# Patient Record
Sex: Male | Born: 1965 | Race: White | Hispanic: No | Marital: Married | Smoking: Never smoker
Health system: Southern US, Community
[De-identification: ages and names within clinical notes are randomized; demographics above are authoritative.]

## PROBLEM LIST (undated history)

## (undated) DIAGNOSIS — F32A Depression, unspecified: Secondary | ICD-10-CM

## (undated) DIAGNOSIS — G4730 Sleep apnea, unspecified: Secondary | ICD-10-CM

## (undated) DIAGNOSIS — F419 Anxiety disorder, unspecified: Secondary | ICD-10-CM

## (undated) DIAGNOSIS — K219 Gastro-esophageal reflux disease without esophagitis: Secondary | ICD-10-CM

## (undated) DIAGNOSIS — F329 Major depressive disorder, single episode, unspecified: Secondary | ICD-10-CM

## (undated) DIAGNOSIS — I10 Essential (primary) hypertension: Secondary | ICD-10-CM

## (undated) HISTORY — PX: CHOLECYSTECTOMY: SHX55

---

## 2017-10-08 ENCOUNTER — Ambulatory Visit (HOSPITAL_COMMUNITY): Payer: BLUE CROSS/BLUE SHIELD

## 2017-10-08 ENCOUNTER — Ambulatory Visit (HOSPITAL_BASED_OUTPATIENT_CLINIC_OR_DEPARTMENT_OTHER): Payer: BLUE CROSS/BLUE SHIELD

## 2017-10-08 ENCOUNTER — Other Ambulatory Visit: Payer: Self-pay | Admitting: Emergency Medicine

## 2017-10-08 ENCOUNTER — Encounter: Payer: Self-pay | Admitting: Nurse Practitioner

## 2017-10-08 ENCOUNTER — Ambulatory Visit: Payer: BLUE CROSS/BLUE SHIELD | Admitting: Nurse Practitioner

## 2017-10-08 VITALS — BP 146/88 | HR 100 | Temp 98.2°F | Resp 18 | Wt 256.1 lb

## 2017-10-08 DIAGNOSIS — M545 Low back pain: Secondary | ICD-10-CM | POA: Diagnosis not present

## 2017-10-08 DIAGNOSIS — M549 Dorsalgia, unspecified: Secondary | ICD-10-CM

## 2017-10-08 DIAGNOSIS — R771 Abnormality of globulin: Secondary | ICD-10-CM

## 2017-10-08 DIAGNOSIS — F329 Major depressive disorder, single episode, unspecified: Secondary | ICD-10-CM | POA: Diagnosis not present

## 2017-10-08 LAB — CBC WITH DIFFERENTIAL/PLATELET
BASO%: 0.4 % (ref 0.0–2.0)
BASOS ABS: 0.1 10*3/uL (ref 0.0–0.1)
EOS%: 0.5 % (ref 0.0–7.0)
Eosinophils Absolute: 0.1 10*3/uL (ref 0.0–0.5)
HEMATOCRIT: 50.1 % — AB (ref 38.4–49.9)
HGB: 17.2 g/dL — ABNORMAL HIGH (ref 13.0–17.1)
LYMPH#: 3.1 10*3/uL (ref 0.9–3.3)
LYMPH%: 23.6 % (ref 14.0–49.0)
MCH: 30.4 pg (ref 27.2–33.4)
MCHC: 34.2 g/dL (ref 32.0–36.0)
MCV: 88.9 fL (ref 79.3–98.0)
MONO#: 1.1 10*3/uL — ABNORMAL HIGH (ref 0.1–0.9)
MONO%: 8.1 % (ref 0.0–14.0)
NEUT#: 8.9 10*3/uL — ABNORMAL HIGH (ref 1.5–6.5)
NEUT%: 67.4 % (ref 39.0–75.0)
Platelets: 326 10*3/uL (ref 140–400)
RBC: 5.64 10*6/uL (ref 4.20–5.82)
RDW: 14.7 % — ABNORMAL HIGH (ref 11.0–14.6)
WBC: 13.1 10*3/uL — ABNORMAL HIGH (ref 4.0–10.3)

## 2017-10-08 LAB — MORPHOLOGY
PLT EST: ADEQUATE
RBC COMMENTS: NORMAL

## 2017-10-08 LAB — CHCC SMEAR

## 2017-10-08 MED ORDER — OXYCODONE-ACETAMINOPHEN 5-325 MG PO TABS
ORAL_TABLET | ORAL | Status: AC
  Filled 2017-10-08: qty 1

## 2017-10-08 MED ORDER — OXYCODONE-ACETAMINOPHEN 5-325 MG PO TABS
1.0000 | ORAL_TABLET | Freq: Once | ORAL | Status: AC
  Administered 2017-10-08: 1 via ORAL

## 2017-10-08 MED ORDER — OXYCODONE-ACETAMINOPHEN 5-325 MG PO TABS: 1.0000 | ORAL_TABLET | Freq: Four times a day (QID) | ORAL | 0 refills | Status: DC | PRN

## 2017-10-08 NOTE — Progress Notes (Addendum)
New Hematology/Oncology Consult   Referral MD:  Dr. 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  He reports an approximate 4-week history of low back pain.  He threw a chain saw at work, felt a "pop" in his back and sudden onset of pain.  He has had similar pain in the past that has always resolved.  The pain has persisted this time.  He was referred for physical therapy with no improvement.  MRI of the lumbar spine 10/02/2017 showed numerous rounded foci of increased fluid type signal throughout the vertebral bodies with marked hypertrophy of the L4 transverse process.  Findings concerning for malignancy.  He was seen by Dr. Vertell Limber yesterday and subsequently referred to our office.  Past medical history: 1. Hypertension 2. Depression 3. GERD  Past surgical history: 1. Cholecystectomy 2 or 3 years ago  Current Outpatient Medications:  .  ARIPiprazole (ABILIFY) 5 MG tablet, Take 5 mg daily by mouth., Disp: , Rfl:  .  escitalopram (LEXAPRO) 20 MG tablet, Take 20 mg daily by mouth., Disp: , Rfl:  .  HYDROcodone-acetaminophen (NORCO) 10-325 MG tablet, Take 1 tablet every 6 (six) hours as needed by mouth., Disp: , Rfl:  .  losartan (COZAAR) 50 MG tablet, Take 50 mg daily by mouth., Disp: , Rfl:  .  Omeprazole Magnesium (PRILOSEC OTC PO), Take 20 mg daily by mouth., Disp: , Rfl: :  Allergies: No known drug allergies.  FH: Father has stage IV lung cancer, history of melanoma, hypertension; mother with history of melanoma, hypertension; sister with history of melanoma; maternal aunt deceased with breast cancer  SOCIAL HISTORY: He lives in [place] which is near [place].  He is married.  He has 2 kids ages 83 and 95 both in good health.  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  He has been  using snuff since around the age of 27.  He does not smoke cigarettes.  Occasional alcohol intake.  He has never had a blood transfusion.  Review of Systems: No fevers or sweats.  No anorexia or weight loss.  No bleeding.  He notes partial relief of the back pain with hydrocodone.  He has intermittent right shoulder pain.  No dysphagia.  No shortness of breath or cough.  No chest pain.  No leg swelling or calf pain.  No nausea or vomiting.  No change in bowel habits.  No urinary symptoms.  No numbness or tingling in his hands or feet.  No focal extremity weakness or numbness.  No skin changes.  No suspicious moles.  Physical Exam:  Blood pressure (!) 146/88, pulse 100, temperature 98.2 F (36.8 C), temperature source Oral, resp. rate 18, weight 256 lb 1.6 oz (116.2 kg), SpO2 95 %.  HEENT: No lesions within the oral cavity.  No neck mass. Lungs: Lungs clear bilaterally. Cardiac: Regular rate and rhythm. Abdomen: Abdomen soft and nontender.  No hepatosplenomegaly.  No mass. GU: No testicular mass. Vascular: No leg edema. Lymph nodes: No palpable cervical, supraclavicular, axillary or inguinal lymph nodes. Neurologic: Motor strength 5/5. Skin: No unusual appearing moles or skin lesions.  LABS:  Hemoglobin 17.3, white count 15.8, platelet count 300,000, BUN 18, creatinine 1.09, calcium 9.1, total protein 8.7.  PSA 1.2.  RADIOLOGY:  No results found.  Assessment and Plan:  1.  Back pain.  MRI lumbar spine 10/02/2017 with numerous round foci of increased fluid type signal throughout the vertebral bodies with marked hypertrophy of the L4 transverse process. Findings concerning for malignancy. Irregularity of the superior endplate of L1 may represent a compression fracture.   2. Hypertension 3. Depression  Steve Carter is a 51 year old man with a 4-week history of back pain status post recent MRI of the lumbar spine with findings concerning for malignancy.  We reviewed potential diagnoses  including multiple myeloma, lung cancer, other malignancy.  Today we obtained additional labs including serum light chains, beta-2 microglobulin and immunofixation.  Pending those results a decision will be made regarding whether or not to proceed with CT scans.  We will contact him as the results become available.  He will return for a follow-up visit 10/14/2017.  For pain he was provided with a prescription for Percocet 1-2 tablets every 6 hours as needed.  Patient seen with Dr. Benay Spice.  45 minutes were spent face-to-face at today's visit with the majority of that time involved in counseling/coordination of care.  [name], NP 10/08/2017, 10:50 AM   This was a shared visit with [name].  Steve Carter was interviewed and examined.  I reviewed the MRI report and laboratory studies.  He appears to have a malignancy involving spine with associated pain.  The serum total protein and globulin fraction are elevated.  He may have multiple myeloma.  We added additional laboratory studies today to look for evidence of a monoclonal protein.  If positive the plan is to proceed with a diagnostic bone marrow biopsy. If the myeloma panel is negative he will proceed with CT scans. We adjusted the narcotic analgesic regimen today.  He will be scheduled for an office visit within the next 1 week.  Julieanne Manson, MD

## 2017-10-09 ENCOUNTER — Telehealth: Payer: Self-pay | Admitting: *Deleted

## 2017-10-09 ENCOUNTER — Telehealth: Payer: Self-pay | Admitting: Oncology

## 2017-10-09 ENCOUNTER — Other Ambulatory Visit: Payer: Self-pay | Admitting: Nurse Practitioner

## 2017-10-09 ENCOUNTER — Ambulatory Visit (HOSPITAL_COMMUNITY): Admission: RE | Admit: 2017-10-09 | Payer: BLUE CROSS/BLUE SHIELD | Source: Ambulatory Visit

## 2017-10-09 DIAGNOSIS — D472 Monoclonal gammopathy: Secondary | ICD-10-CM

## 2017-10-09 LAB — BETA 2 MICROGLOBULIN, SERUM: BETA 2: 1.6 mg/L (ref 0.6–2.4)

## 2017-10-09 LAB — KAPPA/LAMBDA LIGHT CHAINS
IG KAPPA FREE LIGHT CHAIN: 4.1 mg/L (ref 3.3–19.4)
Ig Lambda Free Light Chain: 585.9 mg/L — ABNORMAL HIGH (ref 5.7–26.3)
KAPPA/LAMBDA FLC RATIO: 0.01 — AB (ref 0.26–1.65)

## 2017-10-09 NOTE — Telephone Encounter (Signed)
Spoke with patient re f/u 11/13 @ 2 pm.

## 2017-10-09 NOTE — Telephone Encounter (Signed)
Called pt's wife, informed her of new BMBX appt on 11/12 at [facility]. Confirmed office visit 11/13. She voiced understanding.

## 2017-10-10 LAB — IMMUNOFIXATION ELECTROPHORESIS
IGA/IMMUNOGLOBULIN A, SERUM: 86 mg/dL — AB (ref 90–386)
IGM (IMMUNOGLOBIN M), SRM: 43 mg/dL (ref 20–172)
TOTAL PROTEIN: 8.6 g/dL — AB (ref 6.0–8.5)

## 2017-10-11 ENCOUNTER — Other Ambulatory Visit: Payer: Self-pay | Admitting: Radiology

## 2017-10-11 ENCOUNTER — Ambulatory Visit (HOSPITAL_COMMUNITY)
Admission: RE | Admit: 2017-10-11 | Discharge: 2017-10-11 | Disposition: A | Discharge status: Home / Self Care | Payer: BLUE CROSS/BLUE SHIELD | Source: Ambulatory Visit | Attending: Nurse Practitioner | Admitting: Nurse Practitioner

## 2017-10-11 ENCOUNTER — Other Ambulatory Visit: Payer: Self-pay | Admitting: Oncology

## 2017-10-11 DIAGNOSIS — D472 Monoclonal gammopathy: Secondary | ICD-10-CM

## 2017-10-11 DIAGNOSIS — M899 Disorder of bone, unspecified: Secondary | ICD-10-CM | POA: Diagnosis not present

## 2017-10-11 DIAGNOSIS — C9000 Multiple myeloma not having achieved remission: Secondary | ICD-10-CM | POA: Insufficient documentation

## 2017-10-11 NOTE — Progress Notes (Signed)
START ON PATHWAY REGIMEN - Multiple Myeloma and Other Plasma Cell Dyscrasias     A cycle is every 21 days:     Bortezomib      Lenalidomide      Dexamethasone   **Always confirm dose/schedule in your pharmacy ordering system**    Patient Characteristics: Newly Diagnosed, Transplant Eligible, Unknown or Awaiting Test Results R-ISS Staging: Not Applicable Disease Classification: Newly Diagnosed Is Patient Eligible for Transplant<= Transplant Eligible Risk Status: Awaiting Test Results Intent of Therapy: Curative Intent, Discussed with Patient

## 2017-10-14 ENCOUNTER — Inpatient Hospital Stay (HOSPITAL_COMMUNITY): Admit: 2017-10-14 | Payer: Self-pay

## 2017-10-14 ENCOUNTER — Other Ambulatory Visit (HOSPITAL_COMMUNITY)
Admission: RE | Admit: 2017-10-14 | Discharge: 2017-10-14 | Disposition: A | Discharge status: Home / Self Care | Payer: BLUE CROSS/BLUE SHIELD | Source: Ambulatory Visit | Attending: Oncology | Admitting: Oncology

## 2017-10-14 ENCOUNTER — Ambulatory Visit
Admission: RE | Admit: 2017-10-14 | Discharge: 2017-10-14 | Disposition: A | Discharge status: Home / Self Care | Payer: BLUE CROSS/BLUE SHIELD | Source: Ambulatory Visit | Attending: Nurse Practitioner | Admitting: Nurse Practitioner

## 2017-10-14 DIAGNOSIS — F17220 Nicotine dependence, chewing tobacco, uncomplicated: Secondary | ICD-10-CM | POA: Insufficient documentation

## 2017-10-14 DIAGNOSIS — K219 Gastro-esophageal reflux disease without esophagitis: Secondary | ICD-10-CM | POA: Diagnosis not present

## 2017-10-14 DIAGNOSIS — F419 Anxiety disorder, unspecified: Secondary | ICD-10-CM | POA: Diagnosis not present

## 2017-10-14 DIAGNOSIS — Z79899 Other long term (current) drug therapy: Secondary | ICD-10-CM | POA: Insufficient documentation

## 2017-10-14 DIAGNOSIS — D472 Monoclonal gammopathy: Secondary | ICD-10-CM | POA: Insufficient documentation

## 2017-10-14 DIAGNOSIS — G4730 Sleep apnea, unspecified: Secondary | ICD-10-CM | POA: Diagnosis not present

## 2017-10-14 DIAGNOSIS — I10 Essential (primary) hypertension: Secondary | ICD-10-CM | POA: Insufficient documentation

## 2017-10-14 DIAGNOSIS — F329 Major depressive disorder, single episode, unspecified: Secondary | ICD-10-CM | POA: Insufficient documentation

## 2017-10-14 DIAGNOSIS — M549 Dorsalgia, unspecified: Secondary | ICD-10-CM | POA: Diagnosis not present

## 2017-10-14 HISTORY — DX: Major depressive disorder, single episode, unspecified: F32.9

## 2017-10-14 HISTORY — DX: Anxiety disorder, unspecified: F41.9

## 2017-10-14 HISTORY — DX: Gastro-esophageal reflux disease without esophagitis: K21.9

## 2017-10-14 HISTORY — DX: Sleep apnea, unspecified: G47.30

## 2017-10-14 HISTORY — DX: Essential (primary) hypertension: I10

## 2017-10-14 HISTORY — DX: Depression, unspecified: F32.A

## 2017-10-14 LAB — CBC WITH DIFFERENTIAL/PLATELET
Basophils Absolute: 0 10*3/uL (ref 0–0.1)
Basophils Relative: 0 %
Eosinophils Absolute: 0.1 10*3/uL (ref 0–0.7)
Eosinophils Relative: 1 %
HEMATOCRIT: 52 % (ref 40.0–52.0)
HEMOGLOBIN: 17.9 g/dL (ref 13.0–18.0)
LYMPHS ABS: 1.5 10*3/uL (ref 1.0–3.6)
LYMPHS PCT: 17 %
MCH: 31 pg (ref 26.0–34.0)
MCHC: 34.5 g/dL (ref 32.0–36.0)
MCV: 89.8 fL (ref 80.0–100.0)
MONOS PCT: 9 %
Monocytes Absolute: 0.8 10*3/uL (ref 0.2–1.0)
Neutro Abs: 6.7 10*3/uL — ABNORMAL HIGH (ref 1.4–6.5)
Neutrophils Relative %: 73 %
Platelets: 245 10*3/uL (ref 150–440)
RBC: 5.79 MIL/uL (ref 4.40–5.90)
RDW: 14.7 % — ABNORMAL HIGH (ref 11.5–14.5)
WBC: 9.1 10*3/uL (ref 3.8–10.6)

## 2017-10-14 LAB — PROTIME-INR
INR: 0.9
Prothrombin Time: 12.1 seconds (ref 11.4–15.2)

## 2017-10-14 LAB — BASIC METABOLIC PANEL
Anion gap: 9 (ref 5–15)
BUN: 11 mg/dL (ref 6–20)
CHLORIDE: 98 mmol/L — AB (ref 101–111)
CO2: 28 mmol/L (ref 22–32)
Calcium: 9.5 mg/dL (ref 8.9–10.3)
Creatinine, Ser: 0.97 mg/dL (ref 0.61–1.24)
GFR calc non Af Amer: >60 mL/min (ref >60)
Glucose, Bld: 143 mg/dL — ABNORMAL HIGH (ref 65–99)
POTASSIUM: 3.8 mmol/L (ref 3.5–5.1)
SODIUM: 135 mmol/L (ref 135–145)

## 2017-10-14 MED ORDER — SODIUM CHLORIDE 0.9 % IV SOLN
INTRAVENOUS | Status: DC
  Administered 2017-10-14: 1000 mL via INTRAVENOUS

## 2017-10-14 MED ORDER — BUPIVACAINE HCL (PF) 0.25 % IJ SOLN
INTRAMUSCULAR | Status: AC | PRN
  Administered 2017-10-14: 6 mL

## 2017-10-14 MED ORDER — FENTANYL CITRATE (PF) 100 MCG/2ML IJ SOLN
INTRAMUSCULAR | Status: AC | PRN
  Administered 2017-10-14: 25 ug via INTRAVENOUS

## 2017-10-14 MED ORDER — MIDAZOLAM HCL 5 MG/5ML IJ SOLN
INTRAMUSCULAR | Status: AC | PRN
  Administered 2017-10-14: 1 mg via INTRAVENOUS

## 2017-10-14 MED ORDER — HYDROCODONE-ACETAMINOPHEN 5-325 MG PO TABS: 1.0000 | ORAL_TABLET | ORAL | Status: DC | PRN

## 2017-10-14 NOTE — Procedures (Signed)
CT Bone Marrow biopsy  Complications:  None  Blood Loss: none  See dictation in canopy pacs

## 2017-10-14 NOTE — H&P (Signed)
Chief Complaint: Patient was seen in consultation today for bone lesion  Referring Physician(s): Tempie Hoist  Supervising Physician: Inez Catalina  Patient Status: [facility] - Out-pt  History of Present Illness: Steve Carter is a 51 y.o. male with past medical history of anxiety, depression, GERD, HTN who presents with complaint of back pain.   MRI [facility] 10/02/17 showed: Numerous round foci of increased fluid type signal throughout the vertebral bodies with marked hypertrophy of the L4 transverse process. Findings concerning for malignancy. Irregularity of the superior endplate of L1 may represent a compression fracture.    DG Bone Survey 10/11/17 showed: Several lytic lesions over the skull, 1.2 cm lytic lesion over the lateral aspect right iliac bone, deformity with possible focal destruction posterolateral aspect left second rib as well as mild mottled lucent appearance of the proximal femurs and anterior pelvis as described. These findings may all be explained by patient's suspected multiple myeloma/monoclonal gammopathy.  Patient was evaluated by Hematology/Oncology who recommends bone marrow biopsy.  Patient presents for procedure today in interventional radiology.  He is in his usual state of health and denies any new complaints.   He has been NPO.  He does not take blood thinners.   Past Medical History:  Diagnosis Date  . Anxiety   . Depression   . GERD (gastroesophageal reflux disease)   . Hypertension   . Sleep apnea    uses C-Pap    Past Surgical History:  Procedure Laterality Date  . CHOLECYSTECTOMY      Allergies: Patient has no known allergies.  Medications: Prior to Admission medications   Medication Sig Start Date End Date Taking? Authorizing Provider  ARIPiprazole (ABILIFY) 5 MG tablet Take 5 mg daily by mouth.   Yes [provider]  escitalopram (LEXAPRO) 20 MG tablet Take 20 mg daily by mouth.   Yes [provider]    HYDROcodone-acetaminophen (NORCO) 10-325 MG tablet Take 1 tablet every 6 (six) hours as needed by mouth.   Yes [provider]  losartan (COZAAR) 50 MG tablet Take 50 mg daily by mouth.   Yes [provider]  Omeprazole Magnesium (PRILOSEC OTC PO) Take 20 mg daily by mouth.   Yes [provider]  oxyCODONE-acetaminophen (PERCOCET/ROXICET) 5-325 MG tablet Take 1-2 tablets every 6 (six) hours as needed by mouth for severe pain. 10/08/17  Yes Owens Shark, NP  testosterone cypionate (DEPOTESTOTERONE CYPIONATE) 100 MG/ML injection Inject 200 mg every 14 (fourteen) days into the muscle. For IM use only   Yes [provider]     History reviewed. No pertinent family history.  Social History   Socioeconomic History  . Marital status: Married    Spouse name: None  . Number of children: None  . Years of education: None  . Highest education level: None  Social Needs  . Financial resource strain: None  . Food insecurity - worry: None  . Food insecurity - inability: None  . Transportation needs - medical: None  . Transportation needs - non-medical: None  Occupational History  . None  Tobacco Use  . Smoking status: Never Smoker  . Smokeless tobacco: Current User    Types: Snuff  Substance and Sexual Activity  . Alcohol use: Yes    Alcohol/week: 0.6 oz    Types: 1 Cans of beer per week  . Drug use: No  . Sexual activity: None  Other Topics Concern  . None  Social History Narrative  . None  Review of Systems  Constitutional: Negative for fatigue and fever.  Respiratory: Negative for cough and shortness of breath.   Cardiovascular: Negative for chest pain.  Gastrointestinal: Negative for abdominal pain.  Musculoskeletal: Positive for back pain.  Psychiatric/Behavioral: Negative for behavioral problems and confusion.    Vital Signs: BP (!) 173/102   Pulse 86   Temp 98.4 F (36.9 C) (Oral)   Resp 11   Ht '6\' 2"'$  (1.88 m)   SpO2 92%   BMI  32.88 kg/m   Physical Exam  Constitutional: He is oriented to person, place, and time. He appears well-developed.  Cardiovascular: Normal rate, regular rhythm and normal heart sounds.  Pulmonary/Chest: Effort normal and breath sounds normal. No respiratory distress.  Abdominal: Soft.  Neurological: He is alert and oriented to person, place, and time.  Skin: Skin is warm and dry.  Psychiatric: He has a normal mood and affect. His behavior is normal. Judgment and thought content normal.  Nursing note and vitals reviewed.   Imaging: Dg Bone Survey Met  Result Date: 10/11/2017 CLINICAL DATA:  Monoclonal gammopathy. Left buttock pain 4 weeks radiating down left lower extremity. EXAM: METASTATIC BONE SURVEY COMPARISON:  None. FINDINGS: Lateral skull film demonstrates multiple round lytic lesions. There is minimal spondylosis throughout the spine without evidence of compression fracture. Subtle grade 1 anterolisthesis of L4 on L5 due to facet arthropathy. There is mild mottled lucent appearance over the proximal femurs and inferior pubic rami a/a scan bilaterally. 1.2 cm oval lytic lesion over the lateral aspect of the right iliac bone. Deformity with possible destruction of a short segment of the posterolateral aspect of the left second rib. Remainder of the exam is unremarkable. IMPRESSION: Several lytic lesions over the skull, 1.2 cm lytic lesion over the lateral aspect right iliac bone, deformity with possible focal destruction posterolateral aspect left second rib as well as mild mottled lucent appearance of the proximal femurs and anterior pelvis as described. These findings may all be explained by patient's suspected multiple myeloma/monoclonal gammopathy. Electronically Signed   By: Marin Olp M.D.   On: 10/11/2017 08:47    Labs:  CBC: Recent Labs    10/08/17 1213 10/14/17 0744  WBC 13.1* 9.1  HGB 17.2* 17.9  HCT 50.1* 52.0  PLT 326 245    COAGS: Recent Labs    10/14/17 0744    INR 0.90    BMP: Recent Labs    10/14/17 0744  NA 135  K 3.8  CL 98*  CO2 28  GLUCOSE 143*  BUN 11  CALCIUM 9.5  CREATININE 0.97  GFRNONAA >60  GFRAA >60    LIVER FUNCTION TESTS: Recent Labs    10/08/17 1205  PROT 8.6*    TUMOR MARKERS: No results for input(s): AFPTM, CEA, CA199, CHROMGRNA in the last 8760 hours.  Assessment and Plan: Patient with past medical history of back pain presents with complaint of bone lesions and elevated IgG.  IR consulted for bone marrow biopsy at the request of Dr. Benay Spice. Patient presents today in their usual state of health.  He has been NPO and is not currently on blood thinners.  Risks and benefits discussed with the patient including, but not limited to bleeding, infection, damage to adjacent structures or low yield requiring additional tests. All of the patient's questions were answered, patient is agreeable to proceed. Consent signed and in chart.  Thank you for this interesting consult.  I greatly enjoyed meeting Steve Carter and look forward to participating in  their care.  A copy of this report was sent to the requesting provider on this date.  Electronically Signed: Docia Barrier, PA 10/14/2017, 8:43 AM   I spent a total of  30 Minutes   in face to face in clinical consultation, greater than 50% of which was counseling/coordinating care for bone lesion.

## 2017-10-14 NOTE — Sedation Documentation (Signed)
Dr. Golden Circle notified of blood pressure

## 2017-10-15 ENCOUNTER — Other Ambulatory Visit: Payer: Self-pay | Admitting: Nurse Practitioner

## 2017-10-15 ENCOUNTER — Encounter: Payer: Self-pay | Admitting: *Deleted

## 2017-10-15 ENCOUNTER — Ambulatory Visit: Payer: BLUE CROSS/BLUE SHIELD | Admitting: Oncology

## 2017-10-15 ENCOUNTER — Other Ambulatory Visit: Payer: Self-pay | Admitting: Oncology

## 2017-10-15 ENCOUNTER — Telehealth: Payer: Self-pay | Admitting: Oncology

## 2017-10-15 VITALS — BP 175/113 | HR 106 | Temp 98.1°F | Resp 20 | Ht 74.0 in | Wt 252.4 lb

## 2017-10-15 DIAGNOSIS — M545 Low back pain: Secondary | ICD-10-CM | POA: Diagnosis not present

## 2017-10-15 DIAGNOSIS — Z23 Encounter for immunization: Secondary | ICD-10-CM

## 2017-10-15 DIAGNOSIS — G893 Neoplasm related pain (acute) (chronic): Secondary | ICD-10-CM

## 2017-10-15 DIAGNOSIS — C9000 Multiple myeloma not having achieved remission: Secondary | ICD-10-CM | POA: Diagnosis not present

## 2017-10-15 MED ORDER — ACYCLOVIR 400 MG PO TABS: 400.0000 mg | ORAL_TABLET | Freq: Two times a day (BID) | ORAL | 1 refills | Status: DC

## 2017-10-15 MED ORDER — PROCHLORPERAZINE MALEATE 10 MG PO TABS: 10.0000 mg | ORAL_TABLET | Freq: Four times a day (QID) | ORAL | 1 refills | Status: DC | PRN

## 2017-10-15 MED ORDER — ASPIRIN EC 81 MG PO TBEC: 81.0000 mg | DELAYED_RELEASE_TABLET | Freq: Every day | ORAL | Status: DC

## 2017-10-15 MED ORDER — INFLUENZA VAC SPLIT QUAD 0.5 ML IM SUSY
0.5000 mL | PREFILLED_SYRINGE | Freq: Once | INTRAMUSCULAR | Status: AC
  Administered 2017-10-15: 0.5 mL via INTRAMUSCULAR
  Filled 2017-10-15: qty 0.5

## 2017-10-15 NOTE — Progress Notes (Signed)
ON PATHWAY REGIMEN - Multiple Myeloma and Other Plasma Cell Dyscrasias  No Change  Continue With Treatment as Ordered.     A cycle is every 21 days:     Bortezomib      Lenalidomide      Dexamethasone   **Always confirm dose/schedule in your pharmacy ordering system**    Patient Characteristics: Newly Diagnosed, Transplant Eligible, Unknown or Awaiting Test Results R-ISS Staging: Not Applicable Disease Classification: Newly Diagnosed Is Patient Eligible for Transplant<= Transplant Eligible Risk Status: Awaiting Test Results Intent of Therapy: Curative Intent, Discussed with Patient

## 2017-10-15 NOTE — Telephone Encounter (Signed)
Scheduled appt per 11/13 los - gave patient AVS and calender per los.   

## 2017-10-15 NOTE — Progress Notes (Signed)
[facility] OFFICE PROGRESS NOTE   Diagnosis: Multiple myeloma  INTERVAL HISTORY:   Steve Carter returns as scheduled.  He continues to have pain at the left lower back.  The pain is relieved with oxycodone.  He does not take pain medication at night.  Pain radiates into the left upper leg.  No other complaint. He underwent a bone marrow biopsy yesterday.  He reports tolerating the procedure well.  Objective:  Vital signs in last 24 hours:  Blood pressure (!) 175/113, pulse (!) 106, temperature 98.1 F (36.7 C), temperature source Oral, resp. rate 20, height '6\' 2"'$  (1.88 m), weight 252 lb 6.4 oz (114.5 kg), SpO2 96 %.    Resp: Lungs clear bilaterally Cardio: Regular rate and rhythm GI: No hepatosplenomegaly Vascular: No leg edema Musculoskeletal: No tenderness at the left lower back or iliac Skin: Rubror of the face, bone marrow site with a bandage   Lab Results:  Lab Results  Component Value Date   WBC 9.1 10/14/2017   HGB 17.9 10/14/2017   HCT 52.0 10/14/2017   MCV 89.8 10/14/2017   PLT 245 10/14/2017   NEUTROABS 6.7 (H) 10/14/2017    CMP     Component Value Date/Time   NA 135 10/14/2017 0744   K 3.8 10/14/2017 0744   CL 98 (L) 10/14/2017 0744   CO2 28 10/14/2017 0744   GLUCOSE 143 (H) 10/14/2017 0744   BUN 11 10/14/2017 0744   CREATININE 0.97 10/14/2017 0744   CALCIUM 9.5 10/14/2017 0744   PROT 8.6 (H) 10/08/2017 1205   GFRNONAA >60 10/14/2017 0744   GFRAA >60 10/14/2017 0744    No results found for: CEA1  Lab Results  Component Value Date   INR 0.90 10/14/2017    Imaging:  Ct Biopsy  Result Date: 10/14/2017 INDICATION: Monoclonal gammopathy EXAM: CT-guided bone marrow biopsy and aspiration MEDICATIONS: None. ANESTHESIA/SEDATION: Moderate (conscious) sedation was employed during this procedure. A total of Versed 1 mg and Fentanyl 25 mcg was administered intravenously. Moderate Sedation Time: 18 minutes. The patient's level of  consciousness and vital signs were monitored continuously by radiology nursing throughout the procedure under my direct supervision. FLUOROSCOPY TIME:  Not applicable COMPLICATIONS: None immediate. PROCEDURE: Informed written consent was obtained from the patient after a thorough discussion of the procedural risks, benefits and alternatives. All questions were addressed. Maximal Sterile Barrier Technique was utilized including caps, mask, sterile gowns, sterile gloves, sterile drape, hand hygiene and skin antiseptic. A timeout was performed prior to the initiation of the procedure. Initial imaging shows multiple lytic lesions throughout the pelvic bones to include a large lytic lesion within the posterior aspect of the left iliac bone adjacent to the sacroiliac joint. Sacral lesions are noted as well. Utilizing CT fluoroscopic guidance and 0.25% Marcaine as a local and deep periosteal anesthetic the Oncontrol bone biopsy needle was placed into the right iliac bone adjacent to the sacroiliac joint. Initial aspirates were obtained and deemed adequate by pathology. Subsequently 2 bony cores were obtained from the right iliac bone. The second core had a significant soft tissue component consistent with the lytic lesion seen on the initial CT scanning. Puncture site was dressed in the standard sterile manner. The patient tolerated the procedure well and was returned his room in satisfactory condition. IMPRESSION: Successful CT-guided bone marrow aspiration and biopsy as described above. Electronically Signed   By: Inez Catalina M.D.   On: 10/14/2017 11:22    Medications: I have reviewed the patient's current  medications.  Assessment/Plan:  1.  Multiple myeloma- IgG lambda serum monoclonal protein, elevated serum lambda light chains  Bone survey 10/11/2017-lytic lesions noted in the skull, right iliac, left second rib, and mottled appearance of the proximal femurs/pelvis  2.  Pain secondary to multiple myeloma  involving the spine and pelvis  MRI of the lumbar spine 10/02/2018- numerous rounded foci in the vertebral bodies, hypertrophy of the L4 transverse process  3.  Hypertension  4.  Depression  Disposition:  Steve Carter has been diagnosed with multiple myeloma.  He has a serum M spike, elevated serum free light chains, and lytic bone lesions.  A bone marrow biopsy yesterday confirmed a plasma cell infiltrate.  I discussed the diagnosis and treatment options with Steve Carter and his wife.  I discussed the case with the bone marrow transplant service at [facility].  The plan is to proceed with induction RVD to be followed by autologous stem cell transplant.  I discussed the potential toxicities associated with the RVD regimen including the chance for nausea, diarrhea, hematologic toxicity, and neuropathy.  We discussed the insomnia, psychosis, decreased bone density, and peptic ulcer disease associated with steroids.  We discussed the increased risk of venous thromboembolic disease.  He will be placed on zoster prophylaxis and aspirin.  He will attended chemotherapy teaching class.  The plan is to initiate Zometa prophylaxis beginning with cycle 2.  We discussed the potential for osteonecrosis of the jaw.  He will schedule a dental appointment within the next few weeks.  Mr. Scorsone agrees to proceed with RVD chemotherapy.  He will return to initiate Velcade/Decadron on 10/18/2017.  The plan is to deliver Velcade/Decadron on a day 1, day 8, day 15 schedule.  He will take Revlimid on days 1-21.  40 minutes were spent with the patient today.  The majority of the time was used for counseling and coordination of care.  Betsy Coder, MD  10/15/2017  3:25 PM

## 2017-10-16 ENCOUNTER — Telehealth: Payer: Self-pay | Admitting: Pharmacy Technician

## 2017-10-16 ENCOUNTER — Encounter: Payer: Self-pay | Admitting: *Deleted

## 2017-10-16 ENCOUNTER — Encounter: Payer: Self-pay | Admitting: Nurse Practitioner

## 2017-10-16 ENCOUNTER — Telehealth: Payer: Self-pay | Admitting: *Deleted

## 2017-10-16 NOTE — Telephone Encounter (Signed)
Spoke with pt's wife re: BP on 11/13: Dr. Benay Spice recommends he have BP checked today. She reports her sister is a Marine scientist and can check it. He also has a cuff at home. Wife will call back/ send MyChart message with BP readings. Informed her we received [employer] form. Page 1 is missing. She will re-fax.

## 2017-10-16 NOTE — Telephone Encounter (Signed)
FYI 1. "Need fax number for Dr. Gearldine Shown nurse.  My husband has a short term disability form for MD."   2. "Questions about appointments.  [place] says they are wrong because injection only takes 15 minutes." Provided POD 2 fax: [phone].  Indicate if how you would like to receive completed form (Pick up from office, fax to you or disability manager or mail.)  Once form(s) staff received request, completion of forms requires ten business days.  Velcade injection will be a fifteen minute procedure however the 1.5 hour time allows nurse assessment, pharmacy confirmation of orders, mixing injection and nurse may monitor for reaction.  Denies any further questions at this time.

## 2017-10-16 NOTE — Telephone Encounter (Signed)
Oral Oncology Patient Advocate Encounter  Prior Authorization for Revlimid has been approved.    PA# [id number] Effective dates: 10/16/2017 through 10/15/2018  Oral Oncology Clinic will continue to follow.   Fabio Asa. Melynda Keller, [place] Patient [place] [phone] 10/16/2017 2:54 PM

## 2017-10-17 ENCOUNTER — Telehealth: Payer: Self-pay | Admitting: Pharmacist

## 2017-10-17 ENCOUNTER — Other Ambulatory Visit: Payer: Self-pay | Admitting: Pharmacist

## 2017-10-17 DIAGNOSIS — C9000 Multiple myeloma not having achieved remission: Secondary | ICD-10-CM

## 2017-10-17 MED ORDER — LENALIDOMIDE 25 MG PO CAPS: ORAL_CAPSULE | ORAL | 0 refills | Status: DC

## 2017-10-17 NOTE — Telephone Encounter (Signed)
Oral Oncology Pharmacist Encounter  Received new prescription for Revlimid for the treatment of multiple myeloma in conjunction with Velcade and dexamethasone, planned duration 4-6 cycles then reassess for autologous stem cell transplant.  Labs from 10/08/2017 assessed, found to be within normal limits and OK for treatment at this time. Did note there was no recent assessment of liver function in Epic, recommended to be checked at baseline will discuss with physician.    Noted that patient is to take dexamethasone with this treatment, but no dexamethasone prescription has been sent at this time, will discuss with MD. Also noted that patient is to begin zometa with cycle 2, but no order has been entered at this time, will discuss with MD.   Due to patient taking both revlimid and dexamethasone, patient will need VTE prophylaxis. Based on patient past medical history, seems to be low risk and will use aspirin 81mg PO daily for prophylaxis over LMWH. Will ensure patient understands importance of taking the aspirin.  Noted patient on appropriately dosed acyclovir for VZV/HSV prophylaxis.   Current medication list in Epic reviewed, DDIs with velcade identified:  Velcade and Abilify: Blood pressure lowering agents may enhance the hypotensive effects of atypical antipsychotics. Blood pressures in Epic assessed, patient blood pressure will likely be able to tolerate additional lowering as are above goal, no change to therapy is indicated at this time.   Prescription has been e-scribed to the [facility] for benefits analysis and approval per insurance requirement.   Oral Oncology Clinic will continue to follow for insurance authorization, copayment issues, initial counseling and start date.   , Pharm.D. PGY1 Pharmacy Resident 10/17/2017 3:45 PM Phone: [phone]   

## 2017-10-18 ENCOUNTER — Ambulatory Visit (HOSPITAL_COMMUNITY): Payer: BLUE CROSS/BLUE SHIELD

## 2017-10-18 ENCOUNTER — Other Ambulatory Visit: Payer: BLUE CROSS/BLUE SHIELD

## 2017-10-18 ENCOUNTER — Other Ambulatory Visit: Payer: Self-pay | Admitting: Medical Oncology

## 2017-10-18 ENCOUNTER — Ambulatory Visit (HOSPITAL_BASED_OUTPATIENT_CLINIC_OR_DEPARTMENT_OTHER): Payer: BLUE CROSS/BLUE SHIELD

## 2017-10-18 ENCOUNTER — Telehealth: Payer: Self-pay | Admitting: Medical Oncology

## 2017-10-18 VITALS — BP 162/112 | HR 72 | Temp 97.7°F | Resp 20

## 2017-10-18 DIAGNOSIS — Z5112 Encounter for antineoplastic immunotherapy: Secondary | ICD-10-CM | POA: Diagnosis not present

## 2017-10-18 DIAGNOSIS — C9000 Multiple myeloma not having achieved remission: Secondary | ICD-10-CM | POA: Diagnosis not present

## 2017-10-18 MED ORDER — BORTEZOMIB CHEMO SQ INJECTION 3.5 MG (2.5MG/ML)
1.3000 mg/m2 | Freq: Once | INTRAMUSCULAR | Status: AC
  Administered 2017-10-18: 3.25 mg via SUBCUTANEOUS
  Filled 2017-10-18: qty 3.25

## 2017-10-18 MED ORDER — DEXAMETHASONE 4 MG PO TABS
ORAL_TABLET | ORAL | Status: AC
  Filled 2017-10-18: qty 10

## 2017-10-18 MED ORDER — DEXAMETHASONE 4 MG PO TABS: 40.0000 mg | ORAL_TABLET | ORAL | 1 refills | Status: DC

## 2017-10-18 MED ORDER — PROCHLORPERAZINE MALEATE 10 MG PO TABS
ORAL_TABLET | ORAL | Status: AC
  Filled 2017-10-18: qty 1

## 2017-10-18 MED ORDER — DEXAMETHASONE 4 MG PO TABS
40.0000 mg | ORAL_TABLET | Freq: Once | ORAL | Status: AC
  Administered 2017-10-18: 40 mg via ORAL

## 2017-10-18 MED ORDER — PROCHLORPERAZINE MALEATE 10 MG PO TABS
10.0000 mg | ORAL_TABLET | Freq: Once | ORAL | Status: AC
  Administered 2017-10-18: 10 mg via ORAL

## 2017-10-18 NOTE — Progress Notes (Signed)
Per Shauna Hugh, RN for Dr. Learta Codding, okay to treat with labs from 10/14/17

## 2017-10-18 NOTE — Telephone Encounter (Addendum)
Oral Chemotherapy Pharmacist Encounter   I spoke with patient for overview of new oral chemotherapy medication: Revlimid for the treatment of multiple myeloma in conjunction with Velcade and dexamethasone, planned duration 4-6 cycles then reassess for autologous stem cell transplant.   Met with patient and his wife today in chemo education class, where I counseled both on administration, dosing, side effects, monitoring, drug-food interactions, safe handling, storage, and disposal.  Patient will take Revlimid 25 mg capsules, 1 capsule by mouth once daily, without regard to food, with a full glass of water. Revlimid will be given 21 days on, 7 days off, repeat every 21 days. Patient will take dexamethasone '4mg'$  tablets, 10 tablets (40 mg) by mouth once weekly with breakfast. Revlimid start date: To be determined, discussed with patient that this medication will be coming from [facility] and to expect a call from this pharmacy with directions on copay and acquisition today or Monday. Patient and wife will call Dr. Gearldine Shown clinic when they have Revlimid in hand to determine start date.   Side effects of Revlimid include but not limited to: nausea, constipation, diarrhea, abdominal pain, rash, fatigue, drug fever, and decreased blood counts.    Reviewed with patient importance of keeping a medication schedule and plan for any missed doses.  [name] and his wife voiced understanding and appreciation.   All questions answered. Medication reconciliation performed and medication/allergy list updated.   Patient did pick up acyclovir prescription and was counseled on importance of taking. Received first dose of dexamethasone in infusion clinic today, a prescription was sent to patient's pharmacy for further weekly doses and patient and wife voiced understanding that they need to pick up this prescription and begin with next velcade dose.  Patient counseled on  importance of daily aspirin '81mg'$  for VTE prophylaxis.  Patient knows to call the office with questions or concerns. Oral Oncology Clinic will continue to follow.  Jalene Mullet, Pharm.D. PGY1 Pharmacy Resident 10/18/2017 2:00 PM Phone: [phone]

## 2017-10-18 NOTE — Patient Instructions (Signed)
[facility] Discharge Instructions for Patients Receiving Chemotherapy  Today you received the following chemotherapy agents Velcade  To help prevent nausea and vomiting after your treatment, we encourage you to take your nausea medication as directed   If you develop nausea and vomiting that is not controlled by your nausea medication, call the clinic.   BELOW ARE SYMPTOMS THAT SHOULD BE REPORTED IMMEDIATELY:  *FEVER GREATER THAN 100.5 F  *CHILLS WITH OR WITHOUT FEVER  NAUSEA AND VOMITING THAT IS NOT CONTROLLED WITH YOUR NAUSEA MEDICATION  *UNUSUAL SHORTNESS OF BREATH  *UNUSUAL BRUISING OR BLEEDING  TENDERNESS IN MOUTH AND THROAT WITH OR WITHOUT PRESENCE OF ULCERS  *URINARY PROBLEMS  *BOWEL PROBLEMS  UNUSUAL RASH Items with * indicate a potential emergency and should be followed up as soon as possible.  Feel free to call the clinic should you have any questions or concerns. The clinic phone number is [phone].  Please show the CHEMO ALERT CARD at check-in to the Emergency Department and triage nurse.    Bortezomib (Velcade) injection What is this medicine? BORTEZOMIB (bor TEZ oh mib) is a medicine that targets proteins in cancer cells and stops the cancer cells from growing. It is used to treat multiple myeloma and mantle-cell lymphoma. This medicine may be used for other purposes; ask your health care provider or pharmacist if you have questions. COMMON BRAND NAME(S): Velcade What should I tell my health care provider before I take this medicine? They need to know if you have any of these conditions: -diabetes -heart disease -irregular heartbeat -liver disease -on hemodialysis -low blood counts, like low white blood cells, platelets, or hemoglobin -peripheral neuropathy -taking medicine for blood pressure -an unusual or allergic reaction to bortezomib, mannitol, boron, other medicines, foods, dyes, or preservatives -pregnant or trying to get  pregnant -breast-feeding How should I use this medicine? This medicine is for injection into a vein or for injection under the skin. It is given by a health care professional in a hospital or clinic setting. Talk to your pediatrician regarding the use of this medicine in children. Special care may be needed. Overdosage: If you think you have taken too much of this medicine contact a poison control center or emergency room at once. NOTE: This medicine is only for you. Do not share this medicine with others. What if I miss a dose? It is important not to miss your dose. Call your doctor or health care professional if you are unable to keep an appointment. What may interact with this medicine? This medicine may interact with the following medications: -ketoconazole -rifampin -ritonavir -St. John's Wort This list may not describe all possible interactions. Give your health care provider a list of all the medicines, herbs, non-prescription drugs, or dietary supplements you use. Also tell them if you smoke, drink alcohol, or use illegal drugs. Some items may interact with your medicine. What should I watch for while using this medicine? You may get drowsy or dizzy. Do not drive, use machinery, or do anything that needs mental alertness until you know how this medicine affects you. Do not stand or sit up quickly, especially if you are an older patient. This reduces the risk of dizzy or fainting spells. In some cases, you may be given additional medicines to help with side effects. Follow all directions for their use. Call your doctor or health care professional for advice if you get a fever, chills or sore throat, or other symptoms of a cold or flu.   Do not treat yourself. This drug decreases your body's ability to fight infections. Try to avoid being around people who are sick. This medicine may increase your risk to bruise or bleed. Call your doctor or health care professional if you notice any unusual  bleeding. You may need blood work done while you are taking this medicine. In some patients, this medicine may cause a serious brain infection that may cause death. If you have any problems seeing, thinking, speaking, walking, or standing, tell your doctor right away. If you cannot reach your doctor, urgently seek other source of medical care. Check with your doctor or health care professional if you get an attack of severe diarrhea, nausea and vomiting, or if you sweat a lot. The loss of too much body fluid can make it dangerous for you to take this medicine. Do not become pregnant while taking this medicine or for at least 2 months after stopping it. Women should inform their doctor if they wish to become pregnant or think they might be pregnant. Men should not father a child while taking this medicine and for at least 2 months after stopping it. There is a potential for serious side effects to an unborn child. Talk to your health care professional or pharmacist for more information. Do not breast-feed an infant while taking this medicine or for 2 months after stopping it. This medicine may interfere with the ability to have a child. You should talk with your doctor or health care professional if you are concerned about your fertility. What side effects may I notice from receiving this medicine? Side effects that you should report to your doctor or health care professional as soon as possible: -allergic reactions like skin rash, itching or hives, swelling of the face, lips, or tongue -breathing problems -changes in hearing -changes in vision -fast, irregular heartbeat -feeling faint or lightheaded, falls -pain, tingling, numbness in the hands or feet -right upper belly pain -seizures -swelling of the ankles, feet, hands -unusual bleeding or bruising -unusually weak or tired -vomiting -yellowing of the eyes or skin Side effects that usually do not require medical attention (report to your  doctor or health care professional if they continue or are bothersome): -changes in emotions or moods -constipation -diarrhea -loss of appetite -headache -irritation at site where injected -nausea This list may not describe all possible side effects. Call your doctor for medical advice about side effects. You may report side effects to FDA at 1-800-FDA-1088. Where should I keep my medicine? This drug is given in a hospital or clinic and will not be stored at home. NOTE: This sheet is a summary. It may not cover all possible information. If you have questions about this medicine, talk to your doctor, pharmacist, or health care provider.  2018 Elsevier/Gold Standard (2016-10-18 15:53:51)  

## 2017-10-18 NOTE — Telephone Encounter (Signed)
Pt does not need CT scans.

## 2017-10-19 ENCOUNTER — Encounter: Payer: Self-pay | Admitting: Nurse Practitioner

## 2017-10-20 ENCOUNTER — Encounter: Payer: Self-pay | Admitting: Nurse Practitioner

## 2017-10-20 ENCOUNTER — Other Ambulatory Visit: Payer: Self-pay | Admitting: Oncology

## 2017-10-21 ENCOUNTER — Telehealth: Payer: Self-pay | Admitting: *Deleted

## 2017-10-21 DIAGNOSIS — M549 Dorsalgia, unspecified: Secondary | ICD-10-CM

## 2017-10-21 MED ORDER — OXYCODONE-ACETAMINOPHEN 5-325 MG PO TABS: 1.0000 | ORAL_TABLET | Freq: Four times a day (QID) | ORAL | 0 refills | Status: DC | PRN

## 2017-10-21 NOTE — Telephone Encounter (Signed)
Received message from [facility] call service: pt is running out of pain med.  Called wife, she reports they added Aleve to his narcotic and he's managing with that. Informed her we will print script for pick up. She reports pt's velcade injection site is irritated. Per wife, he has no pain or itching at the site. (Picture has been uploaded to media tab via MyChart.) Message to MD for review.

## 2017-10-22 ENCOUNTER — Encounter: Payer: Self-pay | Admitting: Nurse Practitioner

## 2017-10-22 ENCOUNTER — Encounter: Payer: Self-pay | Admitting: Oncology

## 2017-10-22 NOTE — Progress Notes (Signed)
Called pt to introduce myself as his Arboriculturist and spoke to his wife discussing copay assistance.  I informed her that [employer] has copay assistance for his Dx and for Velcade.  She wanted to apply so I completed the application online and got Dr. Gearldine Shown signature and will get his signature on 10/25/17, once received I will fax to [place] for processing.  I will notify the pt once I get the outcome.  I will also discuss the [place] at that time.

## 2017-10-22 NOTE — Telephone Encounter (Signed)
Oral Oncology Pharmacist Encounter  Received call from patient's wife, Olivia Mackie, that she had not yet heard from dispensing pharmacy about shipment coordination of patient's Revlimid.  Olivia Mackie instructed to call [facility] at [phone] to inquire about status of Rx e-scribed on 10/17/17.  Olivia Mackie will call me back if she needs anything from the office to help coordinate patient's Revlimid.  Oral Oncology Clinic will continue to follow.  Johny Drilling, PharmD, BCPS, BCOP 10/22/2017 9:39 AM Oral Oncology Clinic [phone]

## 2017-10-23 ENCOUNTER — Encounter: Payer: Self-pay | Admitting: Nurse Practitioner

## 2017-10-25 ENCOUNTER — Ambulatory Visit (HOSPITAL_BASED_OUTPATIENT_CLINIC_OR_DEPARTMENT_OTHER): Payer: BLUE CROSS/BLUE SHIELD

## 2017-10-25 ENCOUNTER — Other Ambulatory Visit (HOSPITAL_BASED_OUTPATIENT_CLINIC_OR_DEPARTMENT_OTHER): Payer: BLUE CROSS/BLUE SHIELD

## 2017-10-25 VITALS — BP 164/82 | HR 88 | Temp 99.0°F | Resp 18 | Wt 254.0 lb

## 2017-10-25 DIAGNOSIS — Z5112 Encounter for antineoplastic immunotherapy: Secondary | ICD-10-CM

## 2017-10-25 DIAGNOSIS — C9000 Multiple myeloma not having achieved remission: Secondary | ICD-10-CM

## 2017-10-25 LAB — COMPREHENSIVE METABOLIC PANEL
ALT: 53 U/L (ref 0–55)
AST: 34 U/L (ref 5–34)
Albumin: 3.5 g/dL (ref 3.5–5.0)
Alkaline Phosphatase: 95 U/L (ref 40–150)
Anion Gap: 9 mEq/L (ref 3–11)
BUN: 12.7 mg/dL (ref 7.0–26.0)
CALCIUM: 9.6 mg/dL (ref 8.4–10.4)
CHLORIDE: 98 meq/L (ref 98–109)
CO2: 29 mEq/L (ref 22–29)
CREATININE: 1.1 mg/dL (ref 0.7–1.3)
EGFR: >60 mL/min/{1.73_m2} (ref >60)
GLUCOSE: 145 mg/dL — AB (ref 70–140)
Potassium: 4.2 mEq/L (ref 3.5–5.1)
Sodium: 136 mEq/L (ref 136–145)
Total Bilirubin: 0.72 mg/dL (ref 0.20–1.20)
Total Protein: 9.1 g/dL — ABNORMAL HIGH (ref 6.4–8.3)

## 2017-10-25 LAB — CBC WITH DIFFERENTIAL/PLATELET
BASO%: 0.1 % (ref 0.0–2.0)
BASOS ABS: 0 10*3/uL (ref 0.0–0.1)
EOS ABS: 0.1 10*3/uL (ref 0.0–0.5)
EOS%: 0.5 % (ref 0.0–7.0)
HCT: 47.7 % (ref 38.4–49.9)
HGB: 16.2 g/dL (ref 13.0–17.1)
LYMPH%: 7.9 % — AB (ref 14.0–49.0)
MCH: 31.4 pg (ref 27.2–33.4)
MCHC: 34 g/dL (ref 32.0–36.0)
MCV: 92.4 fL (ref 79.3–98.0)
MONO#: 0.3 10*3/uL (ref 0.1–0.9)
MONO%: 2.1 % (ref 0.0–14.0)
NEUT%: 89.4 % — AB (ref 39.0–75.0)
NEUTROS ABS: 10.5 10*3/uL — AB (ref 1.5–6.5)
Platelets: 201 10*3/uL (ref 140–400)
RBC: 5.16 10*6/uL (ref 4.20–5.82)
RDW: 14.6 % (ref 11.0–14.6)
WBC: 11.7 10*3/uL — AB (ref 4.0–10.3)
lymph#: 0.9 10*3/uL (ref 0.9–3.3)

## 2017-10-25 MED ORDER — BORTEZOMIB CHEMO SQ INJECTION 3.5 MG (2.5MG/ML)
1.3000 mg/m2 | Freq: Once | INTRAMUSCULAR | Status: AC
  Administered 2017-10-25: 3.25 mg via SUBCUTANEOUS
  Filled 2017-10-25: qty 3.25

## 2017-10-25 MED ORDER — PROCHLORPERAZINE MALEATE 10 MG PO TABS
ORAL_TABLET | ORAL | Status: AC
Start: 2017-10-25 — End: 2017-10-25
  Filled 2017-10-25: qty 1

## 2017-10-25 MED ORDER — PROCHLORPERAZINE MALEATE 10 MG PO TABS
10.0000 mg | ORAL_TABLET | Freq: Once | ORAL | Status: AC
  Administered 2017-10-25: 10 mg via ORAL

## 2017-10-25 NOTE — Patient Instructions (Signed)
[facility] Discharge Instructions for Patients Receiving Chemotherapy  Today you received the following chemotherapy agents velcade   To help prevent nausea and vomiting after your treatment, we encourage you to take your nausea medication as directed  If you develop nausea and vomiting that is not controlled by your nausea medication, call the clinic.   BELOW ARE SYMPTOMS THAT SHOULD BE REPORTED IMMEDIATELY:  *FEVER GREATER THAN 100.5 F  *CHILLS WITH OR WITHOUT FEVER  NAUSEA AND VOMITING THAT IS NOT CONTROLLED WITH YOUR NAUSEA MEDICATION  *UNUSUAL SHORTNESS OF BREATH  *UNUSUAL BRUISING OR BLEEDING  TENDERNESS IN MOUTH AND THROAT WITH OR WITHOUT PRESENCE OF ULCERS  *URINARY PROBLEMS  *BOWEL PROBLEMS  UNUSUAL RASH Items with * indicate a potential emergency and should be followed up as soon as possible.  Feel free to call the clinic you have any questions or concerns. The clinic phone number is [phone].  

## 2017-10-25 NOTE — Telephone Encounter (Signed)
Oral Oncology Pharmacist Encounter  Late entry: Spoke with [facility] on 11/20 about copayment issues for Revlimid. Requested patient be signed up for [employer] copayment card to reduce patient's OOP expense for Revlimid to $25/fill. This has been done. Pharmacy to coordinate delivery of the Revlimid to patient.  Noted patient received Revlimid on 10/23/17.  Oral Oncology Clinic will continue to follow.  Johny Drilling, PharmD, BCPS, BCOP 10/25/2017 12:13 PM Oral Oncology Clinic [phone]

## 2017-10-27 ENCOUNTER — Encounter: Payer: Self-pay | Admitting: Nurse Practitioner

## 2017-10-27 ENCOUNTER — Other Ambulatory Visit: Payer: Self-pay | Admitting: Oncology

## 2017-10-28 ENCOUNTER — Encounter: Payer: Self-pay | Admitting: Nurse Practitioner

## 2017-10-28 ENCOUNTER — Telehealth: Payer: Self-pay | Admitting: Oncology

## 2017-10-28 NOTE — Telephone Encounter (Signed)
Called pt's wife, she reports the severe back pain is better today. They found out today that his insurance will be canceled in February per company policy. Wife is asking if there is any assistance program through the hospital to assist with [employer]/ insurance.  She will re-fax her FMLA form as it was not received today. Message to managed care for insurance questions.

## 2017-10-28 NOTE — Telephone Encounter (Signed)
10/28/2017 @ 7:24 am successfully faxed Short Term Disability to [phone] per patient's request

## 2017-10-31 ENCOUNTER — Other Ambulatory Visit: Payer: Self-pay | Admitting: *Deleted

## 2017-10-31 ENCOUNTER — Encounter: Payer: Self-pay | Admitting: Nurse Practitioner

## 2017-10-31 ENCOUNTER — Encounter (HOSPITAL_COMMUNITY): Payer: Self-pay

## 2017-10-31 DIAGNOSIS — C9000 Multiple myeloma not having achieved remission: Secondary | ICD-10-CM

## 2017-10-31 LAB — TISSUE HYBRIDIZATION (BONE MARROW)-NCBH

## 2017-10-31 LAB — CHROMOSOME ANALYSIS, BONE MARROW

## 2017-11-01 ENCOUNTER — Encounter: Payer: Self-pay | Admitting: Oncology

## 2017-11-01 ENCOUNTER — Ambulatory Visit (HOSPITAL_BASED_OUTPATIENT_CLINIC_OR_DEPARTMENT_OTHER): Payer: BLUE CROSS/BLUE SHIELD

## 2017-11-01 ENCOUNTER — Ambulatory Visit: Payer: BLUE CROSS/BLUE SHIELD | Admitting: Oncology

## 2017-11-01 ENCOUNTER — Encounter: Payer: Self-pay | Admitting: *Deleted

## 2017-11-01 ENCOUNTER — Telehealth: Payer: Self-pay

## 2017-11-01 ENCOUNTER — Other Ambulatory Visit (HOSPITAL_BASED_OUTPATIENT_CLINIC_OR_DEPARTMENT_OTHER): Payer: BLUE CROSS/BLUE SHIELD

## 2017-11-01 ENCOUNTER — Telehealth: Payer: Self-pay | Admitting: Oncology

## 2017-11-01 VITALS — BP 158/92 | HR 83 | Temp 98.5°F | Resp 18 | Ht 74.0 in | Wt 251.2 lb

## 2017-11-01 DIAGNOSIS — G893 Neoplasm related pain (acute) (chronic): Secondary | ICD-10-CM | POA: Diagnosis not present

## 2017-11-01 DIAGNOSIS — F329 Major depressive disorder, single episode, unspecified: Secondary | ICD-10-CM | POA: Diagnosis not present

## 2017-11-01 DIAGNOSIS — Z5112 Encounter for antineoplastic immunotherapy: Secondary | ICD-10-CM

## 2017-11-01 DIAGNOSIS — C9000 Multiple myeloma not having achieved remission: Secondary | ICD-10-CM

## 2017-11-01 DIAGNOSIS — M545 Low back pain: Secondary | ICD-10-CM

## 2017-11-01 LAB — CBC WITH DIFFERENTIAL/PLATELET
BASO%: 0.2 % (ref 0.0–2.0)
BASOS ABS: 0 10*3/uL (ref 0.0–0.1)
EOS ABS: 0.1 10*3/uL (ref 0.0–0.5)
EOS%: 0.8 % (ref 0.0–7.0)
HCT: 46.1 % (ref 38.4–49.9)
HEMOGLOBIN: 15.7 g/dL (ref 13.0–17.1)
LYMPH%: 8.4 % — ABNORMAL LOW (ref 14.0–49.0)
MCH: 31.2 pg (ref 27.2–33.4)
MCHC: 34.1 g/dL (ref 32.0–36.0)
MCV: 91.7 fL (ref 79.3–98.0)
MONO#: 0.4 10*3/uL (ref 0.1–0.9)
MONO%: 4.3 % (ref 0.0–14.0)
NEUT#: 8.6 10*3/uL — ABNORMAL HIGH (ref 1.5–6.5)
NEUT%: 86.3 % — ABNORMAL HIGH (ref 39.0–75.0)
Platelets: 269 10*3/uL (ref 140–400)
RBC: 5.03 10*6/uL (ref 4.20–5.82)
RDW: 14.4 % (ref 11.0–14.6)
WBC: 9.9 10*3/uL (ref 4.0–10.3)
lymph#: 0.8 10*3/uL — ABNORMAL LOW (ref 0.9–3.3)

## 2017-11-01 LAB — BASIC METABOLIC PANEL
Anion Gap: 10 mEq/L (ref 3–11)
BUN: 12.3 mg/dL (ref 7.0–26.0)
CHLORIDE: 103 meq/L (ref 98–109)
CO2: 25 meq/L (ref 22–29)
Calcium: 9.3 mg/dL (ref 8.4–10.4)
Creatinine: 1 mg/dL (ref 0.7–1.3)
EGFR: >60 mL/min/{1.73_m2} (ref >60)
Glucose: 146 mg/dl — ABNORMAL HIGH (ref 70–140)
Potassium: 3.3 mEq/L — ABNORMAL LOW (ref 3.5–5.1)
SODIUM: 138 meq/L (ref 136–145)

## 2017-11-01 MED ORDER — BORTEZOMIB CHEMO SQ INJECTION 3.5 MG (2.5MG/ML)
1.3000 mg/m2 | Freq: Once | INTRAMUSCULAR | Status: AC
  Administered 2017-11-01: 3.25 mg via SUBCUTANEOUS
  Filled 2017-11-01: qty 3.25

## 2017-11-01 MED ORDER — PROCHLORPERAZINE MALEATE 10 MG PO TABS
10.0000 mg | ORAL_TABLET | Freq: Once | ORAL | Status: AC
  Administered 2017-11-01: 10 mg via ORAL

## 2017-11-01 MED ORDER — PROCHLORPERAZINE MALEATE 10 MG PO TABS
ORAL_TABLET | ORAL | Status: AC
  Filled 2017-11-01: qty 1

## 2017-11-01 NOTE — Telephone Encounter (Signed)
Printed avs and calender for upcoming appointment. Per 11/30 los 

## 2017-11-01 NOTE — Progress Notes (Signed)
Pt is approved for the $400 [employer] grant.  °

## 2017-11-01 NOTE — Telephone Encounter (Signed)
11/01/2017 @ 8:20 am called Mrs. Ojeda @ [phone] to inform her that hers and her husband's FMLA were completed and hers faxed to [phone].  He requested his be mailed to [address] [place] [address].  Mailed a copy of hers for her personal records along with a copy of the patient's.

## 2017-11-01 NOTE — Patient Instructions (Signed)
[facility] Discharge Instructions for Patients Receiving Chemotherapy  Today you received the following chemotherapy agent: Velcade   To help prevent nausea and vomiting after your treatment, we encourage you to take your nausea medication as directed  If you develop nausea and vomiting that is not controlled by your nausea medication, call the clinic.   BELOW ARE SYMPTOMS THAT SHOULD BE REPORTED IMMEDIATELY:  *FEVER GREATER THAN 100.5 F  *CHILLS WITH OR WITHOUT FEVER  NAUSEA AND VOMITING THAT IS NOT CONTROLLED WITH YOUR NAUSEA MEDICATION  *UNUSUAL SHORTNESS OF BREATH  *UNUSUAL BRUISING OR BLEEDING  TENDERNESS IN MOUTH AND THROAT WITH OR WITHOUT PRESENCE OF ULCERS  *URINARY PROBLEMS  *BOWEL PROBLEMS  UNUSUAL RASH Items with * indicate a potential emergency and should be followed up as soon as possible.  Feel free to call the clinic you have any questions or concerns. The clinic phone number is [phone].  

## 2017-11-01 NOTE — Progress Notes (Addendum)
  [place] OFFICE PROGRESS NOTE   Diagnosis: Multiple myeloma  INTERVAL HISTORY:   [name] returns as scheduled.  He began Velcade/Decadron 10/18/2017.  He started Revlimid 10/23/2017.  There was a delay in the Revlimid start secondary to insurance approval.  He reports tolerating the treatment well.  No nausea, diarrhea, or neuropathy symptoms. He continues to have back and leg pain, but this has improved significantly.  He takes oxycodone approximately every 6 hours. He feels like he is getting a "cold ".  He has upper airway congestion.  No fever or dyspnea. Objective:  Vital signs in last 24 hours:  Blood pressure (!) 158/92, pulse 83, temperature 98.5 F (36.9 C), temperature source Oral, resp. rate 18, height '6\' 2"'$  (1.88 m), weight 251 lb 3.2 oz (113.9 kg), SpO2 100 %.    HEENT: No thrush Resp: Lungs clear bilaterally, no respiratory distress Cardio: Regular rate and rhythm GI: No hepatosplenomegaly Vascular: No leg edema    Lab Results:  Lab Results  Component Value Date   WBC 9.9 11/01/2017   HGB 15.7 11/01/2017   HCT 46.1 11/01/2017   MCV 91.7 11/01/2017   PLT 269 11/01/2017   NEUTROABS 8.6 (H) 11/01/2017    CMP     Component Value Date/Time   NA 138 11/01/2017 0950   K 3.3 (L) 11/01/2017 0950   CL 98 (L) 10/14/2017 0744   CO2 25 11/01/2017 0950   GLUCOSE 146 (H) 11/01/2017 0950   BUN 12.3 11/01/2017 0950   CREATININE 1.0 11/01/2017 0950   CALCIUM 9.3 11/01/2017 0950   PROT 9.1 (H) 10/25/2017 0752   ALBUMIN 3.5 10/25/2017 0752   AST 34 10/25/2017 0752   ALT 53 10/25/2017 0752   ALKPHOS 95 10/25/2017 0752   BILITOT 0.72 10/25/2017 0752   GFRNONAA >60 10/14/2017 0744   GFRAA >60 10/14/2017 0744     Medications: I have reviewed the patient's current medications.  Assessment/Plan: 1.  Multiple myeloma- IgG lambda serum monoclonal protein, elevated serum lambda light chains  Bone survey 10/11/2017-lytic lesions noted in the  skull, right iliac, left second rib, and mottled appearance of the proximal femurs/pelvis  Bone marrow biopsy 10/14/2017-hypercellular marrow with plasma cell neoplasm, 82% plasma cells, lambda light chain restricted, hyperdiploid with gains of chromosomes 3, 5, 7, 9, and 11.  FISH panel positive for gain of ATM (+11)  Cycle 1 RVD 10/18/2017 (Revlimid started 10/23/2017)  2.  Pain secondary to multiple myeloma involving the spine and pelvis  MRI of the lumbar spine 10/02/2018- numerous rounded foci in the vertebral bodies, hypertrophy of the L4 transverse process  3.  Hypertension  4.  Depression   Disposition:  [name] appears to be tolerating the systemic therapy well.  His pain has improved.  He will complete day 15 Velcade/Decadron today.  He will finish the first cycle of Revlimid on 11/08/2017.  He will return for an office visit with the plan to begin cycle 2 RVD on 11/15/2017.  He will receive a first treatment with Zometa during cycle 2.  I encouraged him to decrease the use of oxycodone as tolerated.  He may have an early viral upper respiratory infection.  He knows to seek medical attention for a fever or shortness of breath.  25 minutes were spent with the patient today.    Betsy Coder, MD  11/01/2017  10:51 AM

## 2017-11-06 ENCOUNTER — Encounter: Payer: Self-pay | Admitting: Nurse Practitioner

## 2017-11-06 ENCOUNTER — Encounter: Payer: Self-pay | Admitting: *Deleted

## 2017-11-06 ENCOUNTER — Encounter: Payer: Self-pay | Admitting: Oncology

## 2017-11-06 NOTE — Progress Notes (Signed)
[place] Work  Holiday representative received referral from patients wife for financial concerns and resources.  CSW contacted patient and patients wife at home to offer support and assess for needs.  Patients wife stated patient was currently on short term disability through his employer, and after the 3 months patients insurance would transition to [facility].  CSW and patients wife discussed social security disability, and patient/wife were agreeable to a referral to the servant center.  CSW also encouraged patient to explore the affordable care act for insurance options.  CSW completed servant referral.  Servant center will contact patient to schedule an appointment.  CSW provided contact information and encouraged patient/wife to call with questions or concerns.   Johnnye Lana, MSW, LCSW, OSW-C Clinical Social Worker [facility] [phone]

## 2017-11-06 NOTE — Progress Notes (Signed)
Pt was approved w/ [employer] for $7,500 from 11/1/18to 10/31/19with a90 daylook back period.  Emailed copies of approval letter and POE to Ledora Bottcher and [place] in billing and to HIM to scan in pt's chart.

## 2017-11-10 ENCOUNTER — Other Ambulatory Visit: Payer: Self-pay | Admitting: Oncology

## 2017-11-11 ENCOUNTER — Encounter: Payer: Self-pay | Admitting: Nurse Practitioner

## 2017-11-12 ENCOUNTER — Other Ambulatory Visit: Payer: Self-pay | Admitting: *Deleted

## 2017-11-12 DIAGNOSIS — C9000 Multiple myeloma not having achieved remission: Secondary | ICD-10-CM

## 2017-11-12 MED ORDER — LENALIDOMIDE 25 MG PO CAPS: ORAL_CAPSULE | ORAL | 0 refills | Status: DC

## 2017-11-15 ENCOUNTER — Other Ambulatory Visit (HOSPITAL_BASED_OUTPATIENT_CLINIC_OR_DEPARTMENT_OTHER): Payer: BLUE CROSS/BLUE SHIELD

## 2017-11-15 ENCOUNTER — Telehealth: Payer: Self-pay | Admitting: *Deleted

## 2017-11-15 ENCOUNTER — Ambulatory Visit: Payer: BLUE CROSS/BLUE SHIELD | Admitting: Oncology

## 2017-11-15 ENCOUNTER — Ambulatory Visit (HOSPITAL_BASED_OUTPATIENT_CLINIC_OR_DEPARTMENT_OTHER): Payer: BLUE CROSS/BLUE SHIELD

## 2017-11-15 VITALS — BP 161/97

## 2017-11-15 VITALS — BP 163/99 | HR 76 | Temp 98.2°F | Resp 18 | Ht 74.0 in | Wt 254.5 lb

## 2017-11-15 DIAGNOSIS — M545 Low back pain: Secondary | ICD-10-CM

## 2017-11-15 DIAGNOSIS — Z5112 Encounter for antineoplastic immunotherapy: Secondary | ICD-10-CM

## 2017-11-15 DIAGNOSIS — G893 Neoplasm related pain (acute) (chronic): Secondary | ICD-10-CM

## 2017-11-15 DIAGNOSIS — F329 Major depressive disorder, single episode, unspecified: Secondary | ICD-10-CM

## 2017-11-15 DIAGNOSIS — M549 Dorsalgia, unspecified: Secondary | ICD-10-CM

## 2017-11-15 DIAGNOSIS — C9000 Multiple myeloma not having achieved remission: Secondary | ICD-10-CM | POA: Diagnosis not present

## 2017-11-15 LAB — CBC WITH DIFFERENTIAL/PLATELET
BASO%: 0.6 % (ref 0.0–2.0)
Basophils Absolute: 0 10*3/uL (ref 0.0–0.1)
EOS ABS: 0 10*3/uL (ref 0.0–0.5)
EOS%: 0.2 % (ref 0.0–7.0)
HCT: 43.8 % (ref 38.4–49.9)
HEMOGLOBIN: 14.8 g/dL (ref 13.0–17.1)
LYMPH%: 21.8 % (ref 14.0–49.0)
MCH: 31 pg (ref 27.2–33.4)
MCHC: 33.8 g/dL (ref 32.0–36.0)
MCV: 91.6 fL (ref 79.3–98.0)
MONO#: 0.3 10*3/uL (ref 0.1–0.9)
MONO%: 5.7 % (ref 0.0–14.0)
NEUT%: 71.7 % (ref 39.0–75.0)
NEUTROS ABS: 3.8 10*3/uL (ref 1.5–6.5)
Platelets: 213 10*3/uL (ref 140–400)
RBC: 4.78 10*6/uL (ref 4.20–5.82)
RDW: 14.5 % (ref 11.0–14.6)
WBC: 5.2 10*3/uL (ref 4.0–10.3)
lymph#: 1.1 10*3/uL (ref 0.9–3.3)

## 2017-11-15 LAB — COMPREHENSIVE METABOLIC PANEL
ALBUMIN: 3.3 g/dL — AB (ref 3.5–5.0)
ALK PHOS: 100 U/L (ref 40–150)
ALT: 39 U/L (ref 0–55)
ANION GAP: 12 meq/L — AB (ref 3–11)
AST: 20 U/L (ref 5–34)
BILIRUBIN TOTAL: 0.45 mg/dL (ref 0.20–1.20)
BUN: 12.1 mg/dL (ref 7.0–26.0)
CALCIUM: 9 mg/dL (ref 8.4–10.4)
CHLORIDE: 103 meq/L (ref 98–109)
CO2: 21 mEq/L — ABNORMAL LOW (ref 22–29)
CREATININE: 0.9 mg/dL (ref 0.7–1.3)
EGFR: >60 mL/min/{1.73_m2} (ref >60)
Glucose: 195 mg/dl — ABNORMAL HIGH (ref 70–140)
Potassium: 4 mEq/L (ref 3.5–5.1)
Sodium: 136 mEq/L (ref 136–145)
Total Protein: 8.5 g/dL — ABNORMAL HIGH (ref 6.4–8.3)

## 2017-11-15 MED ORDER — PROCHLORPERAZINE MALEATE 10 MG PO TABS
ORAL_TABLET | ORAL | Status: AC
Start: 2017-11-15 — End: 2017-11-15
  Filled 2017-11-15: qty 1

## 2017-11-15 MED ORDER — LOSARTAN POTASSIUM 50 MG PO TABS: 100.0000 mg | ORAL_TABLET | Freq: Every day | ORAL | 0 refills | Status: DC

## 2017-11-15 MED ORDER — LOSARTAN POTASSIUM 100 MG PO TABS: 100.0000 mg | ORAL_TABLET | Freq: Every day | ORAL | 1 refills | Status: DC

## 2017-11-15 MED ORDER — PROCHLORPERAZINE MALEATE 10 MG PO TABS
10.0000 mg | ORAL_TABLET | Freq: Once | ORAL | Status: AC
  Administered 2017-11-15: 10 mg via ORAL

## 2017-11-15 MED ORDER — SODIUM CHLORIDE 0.9 % IV SOLN
Freq: Once | INTRAVENOUS | Status: AC
  Administered 2017-11-15: 10:00:00 via INTRAVENOUS

## 2017-11-15 MED ORDER — LOSARTAN POTASSIUM 50 MG PO TABS: 50.0000 mg | ORAL_TABLET | Freq: Every day | ORAL | 0 refills | Status: DC

## 2017-11-15 MED ORDER — OXYCODONE-ACETAMINOPHEN 5-325 MG PO TABS: 1.0000 | ORAL_TABLET | Freq: Four times a day (QID) | ORAL | 0 refills | Status: DC | PRN

## 2017-11-15 MED ORDER — BORTEZOMIB CHEMO SQ INJECTION 3.5 MG (2.5MG/ML)
1.3000 mg/m2 | Freq: Once | INTRAMUSCULAR | Status: AC
  Administered 2017-11-15: 3.25 mg via SUBCUTANEOUS
  Filled 2017-11-15: qty 3.25

## 2017-11-15 MED ORDER — ZOLEDRONIC ACID 4 MG/100ML IV SOLN
4.0000 mg | Freq: Once | INTRAVENOUS | Status: AC
  Administered 2017-11-15: 4 mg via INTRAVENOUS
  Filled 2017-11-15: qty 100

## 2017-11-15 NOTE — Telephone Encounter (Signed)
Called [facility] to follow up on 12/11 Revlimid script. Representative reports it takes about 6 days for turnaround once they receive script.  Notified wife that pharmacy will contact them to arrange delivery and that BP medication has been refilled at new dose. She voiced appreciation for call.

## 2017-11-15 NOTE — Addendum Note (Signed)
Addended by: Brien Few on: 11/15/2017 05:54 PM   Modules accepted: Orders

## 2017-11-15 NOTE — Telephone Encounter (Signed)
Received fax from pharmacy, insurance will only cover 1 losartan tablet per day. New script sent for 100 mg tabs, per Dr. Benay Spice.

## 2017-11-15 NOTE — Addendum Note (Signed)
Addended by: Betsy Coder B on: 11/15/2017 09:45 AM   Modules accepted: Orders

## 2017-11-15 NOTE — Addendum Note (Signed)
Addended by: Brien Few on: 11/15/2017 03:53 PM   Modules accepted: Orders

## 2017-11-15 NOTE — Progress Notes (Signed)
  [place] OFFICE PROGRESS NOTE   Diagnosis: Multiple myeloma  INTERVAL HISTORY:   [name] returns as scheduled.  He has completed 1 cycle of Velcade.  The pain at the left lower back and hip area is partially improved.  He continues to take oxycodone approximately every 6 hours.  No neuropathy symptoms.  No nausea.  No symptom of thrombosis.  Objective:  Vital signs in last 24 hours:  Blood pressure (!) 163/99, pulse 76, temperature 98.2 F (36.8 C), temperature source Oral, resp. rate 18, height 6' 2" (1.88 m), weight 254 lb 8 oz (115.4 kg), SpO2 97 %.    HEENT: No thrush Resp: Lungs clear bilaterally Cardio: Regular rate and rhythm GI: No hepatosplenomegaly, nontender Vascular: No leg edema   Lab Results:  Lab Results  Component Value Date   WBC 5.2 11/15/2017   HGB 14.8 11/15/2017   HCT 43.8 11/15/2017   MCV 91.6 11/15/2017   PLT 213 11/15/2017   NEUTROABS 3.8 11/15/2017    CMP     Component Value Date/Time   NA 136 11/15/2017 0810   K 4.0 11/15/2017 0810   CL 98 (L) 10/14/2017 0744   CO2 21 (L) 11/15/2017 0810   GLUCOSE 195 (H) 11/15/2017 0810   BUN 12.1 11/15/2017 0810   CREATININE 0.9 11/15/2017 0810   CALCIUM 9.0 11/15/2017 0810   PROT 8.5 (H) 11/15/2017 0810   ALBUMIN 3.3 (L) 11/15/2017 0810   AST 20 11/15/2017 0810   ALT 39 11/15/2017 0810   ALKPHOS 100 11/15/2017 0810   BILITOT 0.45 11/15/2017 0810   GFRNONAA >60 10/14/2017 0744   GFRAA >60 10/14/2017 0744     Medications: I have reviewed the patient's current medications.  1.Multiple myeloma- IgG lambda serum monoclonal protein, elevated serum lambda light chains  Bone survey 10/11/2017-lytic lesions noted in the skull, right iliac, left second rib, and mottled appearance of the proximal femurs/pelvis  Bone marrow biopsy 10/14/2017-hypercellular marrow with plasma cell neoplasm, 82% plasma cells, lambda light chain restricted, hyperdiploid with gains of chromosomes  3, 5, 7, 9, and 11.  FISH panel positive for gain of ATM (+11)  Cycle 1 RVD 10/18/2017 (Revlimid started 10/23/2017)  Cycle 2 RVD 11/15/2017  2.Pain secondary to multiple myeloma involving the spine and pelvis  MRI of the lumbar spine 10/02/2018- numerous rounded foci in the vertebral bodies, hypertrophy of the L4 transverse process  3.Hypertension-losartan dose increased 11/15/2017  4.Depression    Disposition: [name] has completed 1 cycle of RVD.  His clinical status has improved.  He has tolerated the treatment well.  We will follow-up on the IgG and light chains from today.  The plan is to begin cycle 2 RVD today.  He will receive Zometa today.  I reviewed potential toxicities associated with Zometa including the chance of osteonecrosis.  He agrees to proceed.  He has seen his dentist within the past month.  We adjusted the losartan dose for persistent hypertension.  [name] will return for Velcade on 11/15/2017 and 11/29/2017.  He will be scheduled for an office visit 12/13/2016.  We will refer him to Dr. Stacie Glaze for a pretransplant evaluation.  25 minutes were spent with the patient today.  The majority of the time was used for counseling and coordination of care.  Betsy Coder, MD  11/15/2017  8:58 AM

## 2017-11-15 NOTE — Patient Instructions (Addendum)
[facility] Discharge Instructions for Patients Receiving Chemotherapy  Today you received the following chemotherapy agent: Velcade   To help prevent nausea and vomiting after your treatment, we encourage you to take your nausea medication as directed  If you develop nausea and vomiting that is not controlled by your nausea medication, call the clinic.   BELOW ARE SYMPTOMS THAT SHOULD BE REPORTED IMMEDIATELY:  *FEVER GREATER THAN 100.5 F  *CHILLS WITH OR WITHOUT FEVER  NAUSEA AND VOMITING THAT IS NOT CONTROLLED WITH YOUR NAUSEA MEDICATION  *UNUSUAL SHORTNESS OF BREATH  *UNUSUAL BRUISING OR BLEEDING  TENDERNESS IN MOUTH AND THROAT WITH OR WITHOUT PRESENCE OF ULCERS  *URINARY PROBLEMS  *BOWEL PROBLEMS  UNUSUAL RASH Items with * indicate a potential emergency and should be followed up as soon as possible.  Feel free to call the clinic you have any questions or concerns. The clinic phone number is [phone].  Zoledronic Acid injection (Hypercalcemia, Oncology) What is this medicine? ZOLEDRONIC ACID (ZOE le dron ik AS id) lowers the amount of calcium loss from bone. It is used to treat too much calcium in your blood from cancer. It is also used to prevent complications of cancer that has spread to the bone. This medicine may be used for other purposes; ask your health care provider or pharmacist if you have questions. COMMON BRAND NAME(S): Zometa What should I tell my health care provider before I take this medicine? They need to know if you have any of these conditions: -aspirin-sensitive asthma -cancer, especially if you are receiving medicines used to treat cancer -dental disease or wear dentures -infection -kidney disease -receiving corticosteroids like dexamethasone or prednisone -an unusual or allergic reaction to zoledronic acid, other medicines, foods, dyes, or preservatives -pregnant or trying to get pregnant -breast-feeding How should I use this  medicine? This medicine is for infusion into a vein. It is given by a health care professional in a hospital or clinic setting. Talk to your pediatrician regarding the use of this medicine in children. Special care may be needed. Overdosage: If you think you have taken too much of this medicine contact a poison control center or emergency room at once. NOTE: This medicine is only for you. Do not share this medicine with others. What if I miss a dose? It is important not to miss your dose. Call your doctor or health care professional if you are unable to keep an appointment. What may interact with this medicine? -certain antibiotics given by injection -NSAIDs, medicines for pain and inflammation, like ibuprofen or naproxen -some diuretics like bumetanide, furosemide -teriparatide -thalidomide This list may not describe all possible interactions. Give your health care provider a list of all the medicines, herbs, non-prescription drugs, or dietary supplements you use. Also tell them if you smoke, drink alcohol, or use illegal drugs. Some items may interact with your medicine. What should I watch for while using this medicine? Visit your doctor or health care professional for regular checkups. It may be some time before you see the benefit from this medicine. Do not stop taking your medicine unless your doctor tells you to. Your doctor may order blood tests or other tests to see how you are doing. Women should inform their doctor if they wish to become pregnant or think they might be pregnant. There is a potential for serious side effects to an unborn child. Talk to your health care professional or pharmacist for more information. You should make sure that you get enough  calcium and vitamin D while you are taking this medicine. Discuss the foods you eat and the vitamins you take with your health care professional. Some people who take this medicine have severe bone, joint, and/or muscle pain. This  medicine may also increase your risk for jaw problems or a broken thigh bone. Tell your doctor right away if you have severe pain in your jaw, bones, joints, or muscles. Tell your doctor if you have any pain that does not go away or that gets worse. Tell your dentist and dental surgeon that you are taking this medicine. You should not have major dental surgery while on this medicine. See your dentist to have a dental exam and fix any dental problems before starting this medicine. Take good care of your teeth while on this medicine. Make sure you see your dentist for regular follow-up appointments. What side effects may I notice from receiving this medicine? Side effects that you should report to your doctor or health care professional as soon as possible: -allergic reactions like skin rash, itching or hives, swelling of the face, lips, or tongue -anxiety, confusion, or depression -breathing problems -changes in vision -eye pain -feeling faint or lightheaded, falls -jaw pain, especially after dental work -mouth sores -muscle cramps, stiffness, or weakness -redness, blistering, peeling or loosening of the skin, including inside the mouth -trouble passing urine or change in the amount of urine Side effects that usually do not require medical attention (report to your doctor or health care professional if they continue or are bothersome): -bone, joint, or muscle pain -constipation -diarrhea -fever -hair loss -irritation at site where injected -loss of appetite -nausea, vomiting -stomach upset -trouble sleeping -trouble swallowing -weak or tired This list may not describe all possible side effects. Call your doctor for medical advice about side effects. You may report side effects to FDA at 1-800-FDA-1088. Where should I keep my medicine? This drug is given in a hospital or clinic and will not be stored at home. NOTE: This sheet is a summary. It may not cover all possible information. If  you have questions about this medicine, talk to your doctor, pharmacist, or health care provider.  2018 Elsevier/Gold Standard (2014-04-17 14:19:39)

## 2017-11-16 ENCOUNTER — Telehealth: Payer: Self-pay | Admitting: Oncology

## 2017-11-16 LAB — IGG

## 2017-11-16 NOTE — Telephone Encounter (Signed)
Added appointments for January and February. Patient to get updated schedule at next visit. Dates per 12/14 los, which excludes 1/4 and 2/1.

## 2017-11-18 ENCOUNTER — Encounter: Payer: Self-pay | Admitting: Nurse Practitioner

## 2017-11-18 LAB — KAPPA/LAMBDA LIGHT CHAINS
IG LAMBDA FREE LIGHT CHAIN: 540.2 mg/L — AB (ref 5.7–26.3)
Ig Kappa Free Light Chain: 10.5 mg/L (ref 3.3–19.4)
KAPPA/LAMBDA FLC RATIO: 0.02 — AB (ref 0.26–1.65)

## 2017-11-19 ENCOUNTER — Encounter: Payer: Self-pay | Admitting: Nurse Practitioner

## 2017-11-19 LAB — PROTEIN ELECTROPHORESIS, SERUM
A/G Ratio: 0.7 (ref 0.7–1.7)
ALPHA 1: 0.3 g/dL (ref 0.0–0.4)
Albumin: 3.5 g/dL (ref 2.9–4.4)
Alpha 2: 0.9 g/dL (ref 0.4–1.0)
Beta: 1 g/dL (ref 0.7–1.3)
GAMMA GLOBULIN: 2.5 g/dL — AB (ref 0.4–1.8)
Globulin, Total: 4.7 g/dL — ABNORMAL HIGH (ref 2.2–3.9)
M-SPIKE, %: 2.2 g/dL — AB
TOTAL PROTEIN: 8.2 g/dL (ref 6.0–8.5)

## 2017-11-19 NOTE — Telephone Encounter (Signed)
[employer]. Spoke with Will, refill is "ready to go." Requested he contact pt to arrange delivery.

## 2017-11-20 ENCOUNTER — Encounter: Payer: Self-pay | Admitting: Nurse Practitioner

## 2017-11-20 ENCOUNTER — Telehealth: Payer: Self-pay | Admitting: *Deleted

## 2017-11-20 NOTE — Telephone Encounter (Signed)
Telephoned patient with myeloma protein level results spoke with wife relayed Dr. Ashok Cordia message.

## 2017-11-20 NOTE — Telephone Encounter (Signed)
-----   Message from Ladell Pier, MD sent at 11/19/2017  6:05 PM EST ----- Please call patient, myeloma protein is lower, continue current treatment and follow-up as scheduled

## 2017-11-22 ENCOUNTER — Other Ambulatory Visit (HOSPITAL_BASED_OUTPATIENT_CLINIC_OR_DEPARTMENT_OTHER): Payer: BLUE CROSS/BLUE SHIELD

## 2017-11-22 ENCOUNTER — Other Ambulatory Visit: Payer: Self-pay | Admitting: Oncology

## 2017-11-22 ENCOUNTER — Ambulatory Visit (HOSPITAL_COMMUNITY)
Admission: RE | Admit: 2017-11-22 | Discharge: 2017-11-22 | Disposition: A | Discharge status: Home / Self Care | Payer: BLUE CROSS/BLUE SHIELD | Source: Ambulatory Visit | Attending: Oncology | Admitting: Oncology

## 2017-11-22 ENCOUNTER — Ambulatory Visit (HOSPITAL_BASED_OUTPATIENT_CLINIC_OR_DEPARTMENT_OTHER): Payer: BLUE CROSS/BLUE SHIELD

## 2017-11-22 ENCOUNTER — Telehealth: Payer: Self-pay | Admitting: Oncology

## 2017-11-22 ENCOUNTER — Encounter: Payer: Self-pay | Admitting: General Practice

## 2017-11-22 ENCOUNTER — Other Ambulatory Visit: Payer: Self-pay | Admitting: *Deleted

## 2017-11-22 VITALS — BP 154/85 | HR 70 | Temp 99.1°F | Resp 16

## 2017-11-22 DIAGNOSIS — C9000 Multiple myeloma not having achieved remission: Secondary | ICD-10-CM | POA: Insufficient documentation

## 2017-11-22 DIAGNOSIS — Z5112 Encounter for antineoplastic immunotherapy: Secondary | ICD-10-CM

## 2017-11-22 LAB — CBC WITH DIFFERENTIAL/PLATELET
BASO%: 0.3 % (ref 0.0–2.0)
BASOS ABS: 0 10*3/uL (ref 0.0–0.1)
EOS%: 1.2 % (ref 0.0–7.0)
Eosinophils Absolute: 0.1 10*3/uL (ref 0.0–0.5)
HEMATOCRIT: 43.6 % (ref 38.4–49.9)
HEMOGLOBIN: 14.9 g/dL (ref 13.0–17.1)
LYMPH#: 0.8 10*3/uL — AB (ref 0.9–3.3)
LYMPH%: 11.1 % — ABNORMAL LOW (ref 14.0–49.0)
MCH: 31.2 pg (ref 27.2–33.4)
MCHC: 34.2 g/dL (ref 32.0–36.0)
MCV: 91.2 fL (ref 79.3–98.0)
MONO#: 0.2 10*3/uL (ref 0.1–0.9)
MONO%: 3 % (ref 0.0–14.0)
NEUT#: 6.2 10*3/uL (ref 1.5–6.5)
NEUT%: 84.4 % — ABNORMAL HIGH (ref 39.0–75.0)
Platelets: 204 10*3/uL (ref 140–400)
RBC: 4.78 10*6/uL (ref 4.20–5.82)
RDW: 14.4 % (ref 11.0–14.6)
WBC: 7.3 10*3/uL (ref 4.0–10.3)

## 2017-11-22 LAB — BASIC METABOLIC PANEL
Anion Gap: 10 mEq/L (ref 3–11)
BUN: 12 mg/dL (ref 7.0–26.0)
CALCIUM: 8.4 mg/dL (ref 8.4–10.4)
CHLORIDE: 104 meq/L (ref 98–109)
CO2: 21 meq/L — AB (ref 22–29)
Creatinine: 1 mg/dL (ref 0.7–1.3)
Glucose: 172 mg/dl — ABNORMAL HIGH (ref 70–140)
POTASSIUM: 3.9 meq/L (ref 3.5–5.1)
SODIUM: 136 meq/L (ref 136–145)

## 2017-11-22 MED ORDER — PROCHLORPERAZINE MALEATE 10 MG PO TABS
ORAL_TABLET | ORAL | Status: AC
  Filled 2017-11-22: qty 1

## 2017-11-22 MED ORDER — BORTEZOMIB CHEMO SQ INJECTION 3.5 MG (2.5MG/ML)
1.3000 mg/m2 | Freq: Once | INTRAMUSCULAR | Status: AC
  Administered 2017-11-22: 3.25 mg via SUBCUTANEOUS
  Filled 2017-11-22: qty 3.25

## 2017-11-22 MED ORDER — PROCHLORPERAZINE MALEATE 10 MG PO TABS: 10.0000 mg | ORAL_TABLET | Freq: Once | ORAL | Status: DC

## 2017-11-22 NOTE — Telephone Encounter (Signed)
[facility] BMT team needs to check with pt Insurance for approval.  The turnaround time is  7 business days. Medical records faxed. Pt's wife is aware.

## 2017-11-22 NOTE — Progress Notes (Signed)
[facility] CSW Progress Note  Met w wife after she and patient completed application for disability w [employer].  Wife requested Medicaid application, referred to Development worker, community for assistance.  Reviewed services available to family via [employer], information packet provided.  Edwyna Shell, LCSW Clinical Social Worker Phone:  [phone]

## 2017-11-22 NOTE — Progress Notes (Signed)
Per Roby Lofts, RN patient reported increase in pain in left hip and being unable to sleep at night. This RN to infusion to assess pain. Patient states that the pain is "in my left hip and kind of goes down my leg". Dr. Benay Spice notified and xray of left hip and femur ordered. Patient instructed to remain on current pain regimen until reevaluation at MD visit next Friday. Patient voiced understanding.   Wylene Simmer, BSN, RN 11/22/2017 2:42 PM

## 2017-11-22 NOTE — Progress Notes (Signed)
Okay to treat today with BMP results and no CMP per Dr Benay Spice.

## 2017-11-24 ENCOUNTER — Other Ambulatory Visit: Payer: Self-pay | Admitting: Oncology

## 2017-11-26 ENCOUNTER — Encounter: Payer: Self-pay | Admitting: Nurse Practitioner

## 2017-11-26 ENCOUNTER — Other Ambulatory Visit: Payer: Self-pay | Admitting: Oncology

## 2017-11-26 DIAGNOSIS — M549 Dorsalgia, unspecified: Secondary | ICD-10-CM

## 2017-11-27 MED ORDER — OXYCODONE-ACETAMINOPHEN 5-325 MG PO TABS: 1.0000 | ORAL_TABLET | Freq: Four times a day (QID) | ORAL | 0 refills | Status: DC | PRN

## 2017-11-29 ENCOUNTER — Other Ambulatory Visit (HOSPITAL_BASED_OUTPATIENT_CLINIC_OR_DEPARTMENT_OTHER): Payer: BLUE CROSS/BLUE SHIELD

## 2017-11-29 ENCOUNTER — Ambulatory Visit (HOSPITAL_BASED_OUTPATIENT_CLINIC_OR_DEPARTMENT_OTHER): Payer: BLUE CROSS/BLUE SHIELD

## 2017-11-29 VITALS — BP 155/95 | HR 87 | Temp 97.7°F | Resp 18

## 2017-11-29 DIAGNOSIS — Z5112 Encounter for antineoplastic immunotherapy: Secondary | ICD-10-CM

## 2017-11-29 DIAGNOSIS — C9000 Multiple myeloma not having achieved remission: Secondary | ICD-10-CM

## 2017-11-29 LAB — CBC WITH DIFFERENTIAL/PLATELET
BASO%: 0.3 % (ref 0.0–2.0)
Basophils Absolute: 0 10*3/uL (ref 0.0–0.1)
EOS ABS: 0.1 10*3/uL (ref 0.0–0.5)
EOS%: 0.5 % (ref 0.0–7.0)
HEMATOCRIT: 43.4 % (ref 38.4–49.9)
HGB: 14.8 g/dL (ref 13.0–17.1)
LYMPH%: 8.5 % — AB (ref 14.0–49.0)
MCH: 31.2 pg (ref 27.2–33.4)
MCHC: 34.1 g/dL (ref 32.0–36.0)
MCV: 91.4 fL (ref 79.3–98.0)
MONO#: 0.3 10*3/uL (ref 0.1–0.9)
MONO%: 2.7 % (ref 0.0–14.0)
NEUT%: 88 % — ABNORMAL HIGH (ref 39.0–75.0)
NEUTROS ABS: 8.4 10*3/uL — AB (ref 1.5–6.5)
NRBC: 0 % (ref 0–0)
PLATELETS: 265 10*3/uL (ref 140–400)
RBC: 4.75 10*6/uL (ref 4.20–5.82)
RDW: 14.7 % — AB (ref 11.0–14.6)
WBC: 9.6 10*3/uL (ref 4.0–10.3)
lymph#: 0.8 10*3/uL — ABNORMAL LOW (ref 0.9–3.3)

## 2017-11-29 LAB — BASIC METABOLIC PANEL
ANION GAP: 11 meq/L (ref 3–11)
BUN: 11.3 mg/dL (ref 7.0–26.0)
CHLORIDE: 103 meq/L (ref 98–109)
CO2: 22 mEq/L (ref 22–29)
Calcium: 8.5 mg/dL (ref 8.4–10.4)
Creatinine: 0.9 mg/dL (ref 0.7–1.3)
Glucose: 232 mg/dl — ABNORMAL HIGH (ref 70–140)
POTASSIUM: 3.5 meq/L (ref 3.5–5.1)
SODIUM: 136 meq/L (ref 136–145)

## 2017-11-29 LAB — TECHNOLOGIST REVIEW

## 2017-11-29 MED ORDER — PROCHLORPERAZINE MALEATE 10 MG PO TABS
ORAL_TABLET | ORAL | Status: AC
  Filled 2017-11-29: qty 1

## 2017-11-29 MED ORDER — BORTEZOMIB CHEMO SQ INJECTION 3.5 MG (2.5MG/ML)
1.3000 mg/m2 | Freq: Once | INTRAMUSCULAR | Status: AC
  Administered 2017-11-29: 3.25 mg via SUBCUTANEOUS
  Filled 2017-11-29: qty 3.25

## 2017-11-29 MED ORDER — PROCHLORPERAZINE MALEATE 10 MG PO TABS
10.0000 mg | ORAL_TABLET | Freq: Once | ORAL | Status: AC
  Administered 2017-11-29: 10 mg via ORAL

## 2017-11-29 NOTE — Patient Instructions (Signed)
[facility] Discharge Instructions for Patients Receiving Chemotherapy  Today you received the following chemotherapy agent: Velcade   To help prevent nausea and vomiting after your treatment, we encourage you to take your nausea medication as directed  If you develop nausea and vomiting that is not controlled by your nausea medication, call the clinic.   BELOW ARE SYMPTOMS THAT SHOULD BE REPORTED IMMEDIATELY:  *FEVER GREATER THAN 100.5 F  *CHILLS WITH OR WITHOUT FEVER  NAUSEA AND VOMITING THAT IS NOT CONTROLLED WITH YOUR NAUSEA MEDICATION  *UNUSUAL SHORTNESS OF BREATH  *UNUSUAL BRUISING OR BLEEDING  TENDERNESS IN MOUTH AND THROAT WITH OR WITHOUT PRESENCE OF ULCERS  *URINARY PROBLEMS  *BOWEL PROBLEMS  UNUSUAL RASH Items with * indicate a potential emergency and should be followed up as soon as possible.  Feel free to call the clinic you have any questions or concerns. The clinic phone number is [phone].  

## 2017-12-02 ENCOUNTER — Telehealth: Payer: Self-pay | Admitting: Oncology

## 2017-12-02 NOTE — Telephone Encounter (Signed)
PT APPT. WITH [facility] IS 01/13/18. PT'S WIFE IS AWARE.

## 2017-12-02 NOTE — Telephone Encounter (Signed)
Faxed records to [facility] °

## 2017-12-03 ENCOUNTER — Encounter: Payer: Self-pay | Admitting: Nurse Practitioner

## 2017-12-04 ENCOUNTER — Encounter: Payer: Self-pay | Admitting: *Deleted

## 2017-12-04 ENCOUNTER — Encounter: Payer: Self-pay | Admitting: Nurse Practitioner

## 2017-12-05 ENCOUNTER — Encounter: Payer: Self-pay | Admitting: Nurse Practitioner

## 2017-12-06 ENCOUNTER — Other Ambulatory Visit: Payer: Self-pay | Admitting: Emergency Medicine

## 2017-12-06 MED ORDER — ACYCLOVIR 400 MG PO TABS: 400.0000 mg | ORAL_TABLET | Freq: Two times a day (BID) | ORAL | 1 refills | Status: DC

## 2017-12-07 ENCOUNTER — Other Ambulatory Visit: Payer: Self-pay | Admitting: Oncology

## 2017-12-07 DIAGNOSIS — M549 Dorsalgia, unspecified: Secondary | ICD-10-CM

## 2017-12-08 ENCOUNTER — Other Ambulatory Visit: Payer: Self-pay | Admitting: Oncology

## 2017-12-08 DIAGNOSIS — C9000 Multiple myeloma not having achieved remission: Secondary | ICD-10-CM

## 2017-12-09 ENCOUNTER — Other Ambulatory Visit: Payer: Self-pay

## 2017-12-09 ENCOUNTER — Encounter: Payer: Self-pay | Admitting: Nurse Practitioner

## 2017-12-09 ENCOUNTER — Telehealth: Payer: Self-pay

## 2017-12-09 DIAGNOSIS — C9000 Multiple myeloma not having achieved remission: Secondary | ICD-10-CM

## 2017-12-09 MED ORDER — LENALIDOMIDE 25 MG PO CAPS: ORAL_CAPSULE | ORAL | 0 refills | Status: DC

## 2017-12-09 MED ORDER — DEXAMETHASONE 4 MG PO TABS
40.0000 mg | ORAL_TABLET | ORAL | 1 refills | Status: DC
Start: 2017-12-09 — End: 2018-02-04

## 2017-12-09 MED ORDER — LOSARTAN POTASSIUM 100 MG PO TABS: 100.0000 mg | ORAL_TABLET | Freq: Every day | ORAL | 1 refills | Status: DC

## 2017-12-09 MED ORDER — OXYCODONE-ACETAMINOPHEN 5-325 MG PO TABS: 1.0000 | ORAL_TABLET | Freq: Four times a day (QID) | ORAL | 0 refills | Status: DC | PRN

## 2017-12-09 NOTE — Telephone Encounter (Signed)
Spoke with wife to inquire about refill for Percocet. States that patient "will run out today so he's probably going to have to drive down and pick it up before our appointment on Friday. This RN voiced understanding.

## 2017-12-10 ENCOUNTER — Other Ambulatory Visit: Payer: Self-pay

## 2017-12-10 DIAGNOSIS — C9000 Multiple myeloma not having achieved remission: Secondary | ICD-10-CM

## 2017-12-10 MED ORDER — ACYCLOVIR 400 MG PO TABS: 400.0000 mg | ORAL_TABLET | Freq: Two times a day (BID) | ORAL | 1 refills | Status: DC

## 2017-12-12 ENCOUNTER — Other Ambulatory Visit: Payer: Self-pay | Admitting: Oncology

## 2017-12-12 DIAGNOSIS — C9000 Multiple myeloma not having achieved remission: Secondary | ICD-10-CM

## 2017-12-13 ENCOUNTER — Inpatient Hospital Stay: Payer: BLUE CROSS/BLUE SHIELD | Attending: Nurse Practitioner | Admitting: Nurse Practitioner

## 2017-12-13 ENCOUNTER — Inpatient Hospital Stay: Payer: BLUE CROSS/BLUE SHIELD

## 2017-12-13 ENCOUNTER — Encounter: Payer: Self-pay | Admitting: Nurse Practitioner

## 2017-12-13 ENCOUNTER — Telehealth: Payer: Self-pay | Admitting: Nurse Practitioner

## 2017-12-13 VITALS — BP 169/88 | HR 68 | Temp 97.8°F | Resp 20 | Wt 253.1 lb

## 2017-12-13 DIAGNOSIS — Z5112 Encounter for antineoplastic immunotherapy: Secondary | ICD-10-CM | POA: Diagnosis not present

## 2017-12-13 DIAGNOSIS — C9000 Multiple myeloma not having achieved remission: Secondary | ICD-10-CM

## 2017-12-13 DIAGNOSIS — F329 Major depressive disorder, single episode, unspecified: Secondary | ICD-10-CM | POA: Insufficient documentation

## 2017-12-13 DIAGNOSIS — G893 Neoplasm related pain (acute) (chronic): Secondary | ICD-10-CM | POA: Diagnosis not present

## 2017-12-13 LAB — CBC WITH DIFFERENTIAL/PLATELET
BASOS ABS: 0.1 10*3/uL (ref 0.0–0.1)
BASOS PCT: 1 %
EOS ABS: 0 10*3/uL (ref 0.0–0.5)
Eosinophils Relative: 0 %
HCT: 45.5 % (ref 38.4–49.9)
HEMOGLOBIN: 15.6 g/dL (ref 13.0–17.1)
Lymphocytes Relative: 13 %
Lymphs Abs: 0.8 10*3/uL — ABNORMAL LOW (ref 0.9–3.3)
MCH: 31.1 pg (ref 27.2–33.4)
MCHC: 34.2 g/dL (ref 32.0–36.0)
MCV: 90.8 fL (ref 79.3–98.0)
Monocytes Absolute: 0.1 10*3/uL (ref 0.1–0.9)
Monocytes Relative: 2 %
NEUTROS PCT: 84 %
Neutro Abs: 5 10*3/uL (ref 1.5–6.5)
PLATELETS: 240 10*3/uL (ref 140–400)
RBC: 5.01 MIL/uL (ref 4.20–5.82)
RDW: 15 % (ref 11.0–15.6)
WBC: 6 10*3/uL (ref 4.0–10.3)

## 2017-12-13 LAB — COMPREHENSIVE METABOLIC PANEL
ALT: 36 U/L (ref 0–55)
ANION GAP: 12 — AB (ref 3–11)
AST: 21 U/L (ref 5–34)
Albumin: 3.8 g/dL (ref 3.5–5.0)
Alkaline Phosphatase: 128 U/L (ref 40–150)
BILIRUBIN TOTAL: 0.5 mg/dL (ref 0.2–1.2)
BUN: 10 mg/dL (ref 7–26)
CO2: 22 mmol/L (ref 22–29)
Calcium: 8.7 mg/dL (ref 8.4–10.4)
Chloride: 103 mmol/L (ref 98–109)
Creatinine, Ser: 1.08 mg/dL (ref 0.70–1.30)
GFR calc Af Amer: >60 mL/min (ref >60)
Glucose, Bld: 231 mg/dL — ABNORMAL HIGH (ref 70–140)
POTASSIUM: 3.6 mmol/L (ref 3.5–5.1)
Sodium: 137 mmol/L (ref 136–145)
TOTAL PROTEIN: 8.1 g/dL (ref 6.4–8.3)

## 2017-12-13 MED ORDER — PROCHLORPERAZINE MALEATE 10 MG PO TABS
ORAL_TABLET | ORAL | Status: AC
  Filled 2017-12-13: qty 1

## 2017-12-13 MED ORDER — PROCHLORPERAZINE MALEATE 10 MG PO TABS: 10.0000 mg | ORAL_TABLET | Freq: Once | ORAL | Status: DC

## 2017-12-13 MED ORDER — BORTEZOMIB CHEMO SQ INJECTION 3.5 MG (2.5MG/ML)
1.3000 mg/m2 | Freq: Once | INTRAMUSCULAR | Status: AC
  Administered 2017-12-13: 3.25 mg via SUBCUTANEOUS
  Filled 2017-12-13: qty 3.25

## 2017-12-13 NOTE — Patient Instructions (Signed)
[facility] Discharge Instructions for Patients Receiving Chemotherapy  Today you received the following chemotherapy agent: bortezomib (Velcade)   To help prevent nausea and vomiting after your treatment, we encourage you to take your nausea medication as directed  If you develop nausea and vomiting that is not controlled by your nausea medication, call the clinic.   BELOW ARE SYMPTOMS THAT SHOULD BE REPORTED IMMEDIATELY:  *FEVER GREATER THAN 100.5 F  *CHILLS WITH OR WITHOUT FEVER  NAUSEA AND VOMITING THAT IS NOT CONTROLLED WITH YOUR NAUSEA MEDICATION  *UNUSUAL SHORTNESS OF BREATH  *UNUSUAL BRUISING OR BLEEDING  TENDERNESS IN MOUTH AND THROAT WITH OR WITHOUT PRESENCE OF ULCERS  *URINARY PROBLEMS  *BOWEL PROBLEMS  UNUSUAL RASH Items with * indicate a potential emergency and should be followed up as soon as possible.  Feel free to call the clinic you have any questions or concerns. The clinic phone number is [phone].

## 2017-12-13 NOTE — Telephone Encounter (Signed)
Gave avs and calendar for January and february °

## 2017-12-13 NOTE — Progress Notes (Addendum)
[name] OFFICE PROGRESS NOTE   Diagnosis: Multiple myeloma  INTERVAL HISTORY:   Steve Carter returns as scheduled.  He completed cycle 2 Revlimid beginning 11/15/2017.  He completed cycle 2 of Velcade 11/29/2017.  He denies nausea/vomiting.  No mouth sores.  No constipation or diarrhea.  No numbness or tingling in his hands or feet.  Back pain is better.  He continues to have left "hip" pain.  The pain mainly occurs with activity.  He takes Percocet every 4 hours during the day.  He is able to sleep through the night.  No bowel or bladder dysfunction.  No leg weakness or numbness.  Objective:  Vital signs in last 24 hours:  Blood pressure (!) 169/88, pulse 68, temperature 97.8 F (36.6 C), temperature source Oral, resp. rate 20, weight 253 lb 1.6 oz (114.8 kg), SpO2 97 %.    HEENT: No thrush or ulcers. Resp: Lungs clear bilaterally. Cardio: Regular rate and rhythm. GI: Abdomen soft and nontender.  No hepatosplenomegaly. Vascular: No leg edema.    Lab Results:  Lab Results  Component Value Date   WBC 6.0 12/13/2017   HGB 15.6 12/13/2017   HCT 45.5 12/13/2017   MCV 90.8 12/13/2017   PLT 240 12/13/2017   NEUTROABS 5.0 12/13/2017    Imaging:  No results found.  Medications: I have reviewed the patient's current medications.  Assessment/Plan: 1.Multiple myeloma-IgG lambda serum monoclonal protein, elevated serum lambda light chains  Bone survey 10/11/2017-lytic lesions noted in the skull, right iliac, left second rib, and mottled appearance of the proximal femurs/pelvis  Bone marrow biopsy 10/14/2017-hypercellular marrow with plasma cell neoplasm, 82% plasma cells, lambda light chain restricted, hyperdiploid with gains of chromosomes 3, 5, 7, 9, and 11. FISH panel positive for gain of ATM(+11)  Cycle 1 RVD 10/18/2017 (Revlimid started 10/23/2017)  Cycle 2 RVD 11/15/2017  Cycle 3 RVD 12/13/2017  2.Pain secondary to multiple myeloma  involving the spine and pelvis  MRI of the lumbar spine 10/02/2018-numerous rounded foci in the vertebral bodies, hypertrophy of the L4 transverse process  3.Hypertension-losartan dose increased 11/15/2017  4.Depression    Disposition: Steve Carter appears stable.  He has completed 2 cycles of RVD.  Plan to proceed with cycle 3 today as scheduled.  We will follow-up on the outstanding myeloma labs from today.  He continues to have "hip" pain.  This may be radicular pain.  He will continue Percocet as needed, weaning as tolerated.  He understands to contact the office if the pain worsens or he develops new symptoms.  He will return for Velcade 12/20/2017 and 12/27/2017.  He is scheduled to be seen at [facility] 01/13/2018.  He will return for a follow-up visit here on 01/14/2018.  He will contact the office in the interim as outlined above or with any other problems.  Patient seen with Dr. Benay Spice.  Multiple x-ray images reviewed with [name] and his wife at today's visit.    [name] ANP/GNP-BC   12/13/2017  10:09 AM This was a shared visit with [name].  Steve Carter has completed 2 cycles of RVD.  We are concerned he continues to have significant pain at the left posterior iliac.  The pain is most likely related to radicular pain from the disease at the lumbar spine as opposed to the lytic lesion in the left femur.  He will decrease the use of oxycodone as tolerated.  He will begin cycle 3 RVD today.  He is scheduled for a  transplant evaluation at [facility] next month.  We will see him after the [facility] appointment.  We will follow-up on the myeloma panel from today.  Julieanne Manson, MD

## 2017-12-14 LAB — IGG: IgG (Immunoglobin G), Serum: 1814 mg/dL — ABNORMAL HIGH (ref 700–1600)

## 2017-12-15 ENCOUNTER — Other Ambulatory Visit: Payer: Self-pay | Admitting: Oncology

## 2017-12-16 ENCOUNTER — Telehealth: Payer: Self-pay

## 2017-12-16 LAB — KAPPA/LAMBDA LIGHT CHAINS
KAPPA, LAMDA LIGHT CHAIN RATIO: 0.05 — AB (ref 0.26–1.65)
Kappa free light chain: 8.6 mg/L (ref 3.3–19.4)
LAMDA FREE LIGHT CHAINS: 174.9 mg/L — AB (ref 5.7–26.3)

## 2017-12-16 NOTE — Telephone Encounter (Signed)
Patient wife verbalized understanding regarding message below.

## 2017-12-16 NOTE — Telephone Encounter (Signed)
-----   Message from Ladell Pier, MD sent at 12/15/2017 10:03 AM EST ----- Please call patient, IgG is better, continue current treatment, f/u as scheduled

## 2017-12-17 ENCOUNTER — Other Ambulatory Visit: Payer: Self-pay

## 2017-12-17 DIAGNOSIS — C9000 Multiple myeloma not having achieved remission: Secondary | ICD-10-CM

## 2017-12-17 LAB — PROTEIN ELECTROPHORESIS, SERUM
A/G RATIO SPE: 0.8 (ref 0.7–1.7)
ALBUMIN ELP: 3.4 g/dL (ref 2.9–4.4)
ALPHA-1-GLOBULIN: 0.3 g/dL (ref 0.0–0.4)
ALPHA-2-GLOBULIN: 1 g/dL (ref 0.4–1.0)
BETA GLOBULIN: 1 g/dL (ref 0.7–1.3)
GAMMA GLOBULIN: 1.9 g/dL — AB (ref 0.4–1.8)
Globulin, Total: 4.2 g/dL — ABNORMAL HIGH (ref 2.2–3.9)
M-Spike, %: 1.5 g/dL — ABNORMAL HIGH
Total Protein ELP: 7.6 g/dL (ref 6.0–8.5)

## 2017-12-17 MED ORDER — PROCHLORPERAZINE MALEATE 10 MG PO TABS: 10.0000 mg | ORAL_TABLET | Freq: Four times a day (QID) | ORAL | 1 refills | Status: DC | PRN

## 2017-12-18 ENCOUNTER — Encounter: Payer: Self-pay | Admitting: Nurse Practitioner

## 2017-12-19 ENCOUNTER — Other Ambulatory Visit: Payer: Self-pay | Admitting: *Deleted

## 2017-12-20 ENCOUNTER — Inpatient Hospital Stay: Payer: BLUE CROSS/BLUE SHIELD

## 2017-12-20 VITALS — BP 150/88 | HR 92 | Temp 98.4°F | Resp 16

## 2017-12-20 DIAGNOSIS — Z5112 Encounter for antineoplastic immunotherapy: Secondary | ICD-10-CM | POA: Diagnosis not present

## 2017-12-20 DIAGNOSIS — C9000 Multiple myeloma not having achieved remission: Secondary | ICD-10-CM

## 2017-12-20 LAB — CBC WITH DIFFERENTIAL/PLATELET
BASOS ABS: 0 10*3/uL (ref 0.0–0.1)
BASOS PCT: 0 %
EOS ABS: 0 10*3/uL (ref 0.0–0.5)
EOS PCT: 0 %
HCT: 43.8 % (ref 38.4–49.9)
Hemoglobin: 14.9 g/dL (ref 13.0–17.1)
Lymphocytes Relative: 9 %
Lymphs Abs: 0.5 10*3/uL — ABNORMAL LOW (ref 0.9–3.3)
MCH: 31 pg (ref 27.2–33.4)
MCHC: 34 g/dL (ref 32.0–36.0)
MCV: 91.1 fL (ref 79.3–98.0)
MONOS PCT: 2 %
Monocytes Absolute: 0.1 10*3/uL (ref 0.1–0.9)
NEUTROS ABS: 5.2 10*3/uL (ref 1.5–6.5)
Neutrophils Relative %: 89 %
PLATELETS: 181 10*3/uL (ref 140–400)
RBC: 4.81 MIL/uL (ref 4.20–5.82)
RDW: 14.5 % (ref 11.0–15.6)
WBC: 5.8 10*3/uL (ref 4.0–10.3)

## 2017-12-20 LAB — BASIC METABOLIC PANEL
ANION GAP: 12 — AB (ref 3–11)
BUN: 10 mg/dL (ref 7–26)
CALCIUM: 8.5 mg/dL (ref 8.4–10.4)
CO2: 21 mmol/L — AB (ref 22–29)
CREATININE: 0.97 mg/dL (ref 0.70–1.30)
Chloride: 103 mmol/L (ref 98–109)
GFR calc Af Amer: >60 mL/min (ref >60)
GLUCOSE: 261 mg/dL — AB (ref 70–140)
Potassium: 3.8 mmol/L (ref 3.5–5.1)
Sodium: 136 mmol/L (ref 136–145)

## 2017-12-20 MED ORDER — PROCHLORPERAZINE MALEATE 10 MG PO TABS: 10.0000 mg | ORAL_TABLET | Freq: Once | ORAL | Status: DC

## 2017-12-20 MED ORDER — BORTEZOMIB CHEMO SQ INJECTION 3.5 MG (2.5MG/ML)
1.3000 mg/m2 | Freq: Once | INTRAMUSCULAR | Status: AC
  Administered 2017-12-20: 3.25 mg via SUBCUTANEOUS
  Filled 2017-12-20: qty 3.25

## 2017-12-20 NOTE — Patient Instructions (Signed)
[facility] Discharge Instructions for Patients Receiving Chemotherapy  Today you received the following chemotherapy agents Velcade To help prevent nausea and vomiting after your treatment, we encourage you to take your nausea medication as prescribed.   If you develop nausea and vomiting that is not controlled by your nausea medication, call the clinic.   BELOW ARE SYMPTOMS THAT SHOULD BE REPORTED IMMEDIATELY:  *FEVER GREATER THAN 100.5 F  *CHILLS WITH OR WITHOUT FEVER  NAUSEA AND VOMITING THAT IS NOT CONTROLLED WITH YOUR NAUSEA MEDICATION  *UNUSUAL SHORTNESS OF BREATH  *UNUSUAL BRUISING OR BLEEDING  TENDERNESS IN MOUTH AND THROAT WITH OR WITHOUT PRESENCE OF ULCERS  *URINARY PROBLEMS  *BOWEL PROBLEMS  UNUSUAL RASH Items with * indicate a potential emergency and should be followed up as soon as possible.  Feel free to call the clinic should you have any questions or concerns. The clinic phone number is [phone].  Please show the CHEMO ALERT CARD at check-in to the Emergency Department and triage nurse.   

## 2017-12-22 ENCOUNTER — Other Ambulatory Visit: Payer: Self-pay | Admitting: Oncology

## 2017-12-25 ENCOUNTER — Other Ambulatory Visit: Payer: Self-pay | Admitting: Oncology

## 2017-12-25 ENCOUNTER — Encounter: Payer: Self-pay | Admitting: Nurse Practitioner

## 2017-12-25 DIAGNOSIS — M549 Dorsalgia, unspecified: Secondary | ICD-10-CM

## 2017-12-26 ENCOUNTER — Encounter: Payer: Self-pay | Admitting: *Deleted

## 2017-12-26 NOTE — Telephone Encounter (Signed)
Called pt's wife in response to [employer]. She reports he has full bowel and bladder control during the day. She has noticed that he is drinking more throughout the day. Discussed with Dr. Benay Spice: Likely nothing to worry about if he has control during the day. Blood sugar may be high due to steroids. Call if this persists. Wife voiced understanding. She reports since his glucose was high last tx, she checked his level at home on 1/20 and it was 140.

## 2017-12-27 ENCOUNTER — Other Ambulatory Visit: Payer: Self-pay | Admitting: *Deleted

## 2017-12-27 ENCOUNTER — Inpatient Hospital Stay: Payer: BLUE CROSS/BLUE SHIELD

## 2017-12-27 ENCOUNTER — Ambulatory Visit: Payer: BLUE CROSS/BLUE SHIELD

## 2017-12-27 VITALS — BP 161/79 | HR 81 | Temp 98.1°F | Resp 18

## 2017-12-27 DIAGNOSIS — Z5112 Encounter for antineoplastic immunotherapy: Secondary | ICD-10-CM | POA: Diagnosis not present

## 2017-12-27 DIAGNOSIS — C9000 Multiple myeloma not having achieved remission: Secondary | ICD-10-CM

## 2017-12-27 LAB — CBC WITH DIFFERENTIAL (CANCER CENTER ONLY)
BASOS PCT: 0 %
Basophils Absolute: 0 10*3/uL (ref 0.0–0.1)
EOS ABS: 0 10*3/uL (ref 0.0–0.5)
EOS PCT: 0 %
HCT: 45.8 % (ref 38.4–49.9)
HEMOGLOBIN: 15.3 g/dL (ref 13.0–17.1)
LYMPHS ABS: 0.4 10*3/uL — AB (ref 0.9–3.3)
Lymphocytes Relative: 5 %
MCH: 30.5 pg (ref 27.2–33.4)
MCHC: 33.4 g/dL (ref 32.0–36.0)
MCV: 91.3 fL (ref 79.3–98.0)
Monocytes Absolute: 0.1 10*3/uL (ref 0.1–0.9)
Monocytes Relative: 1 %
NEUTROS PCT: 94 %
Neutro Abs: 9.3 10*3/uL — ABNORMAL HIGH (ref 1.5–6.5)
PLATELETS: 227 10*3/uL (ref 140–400)
RBC: 5.01 MIL/uL (ref 4.20–5.82)
RDW: 15.2 % (ref 11.0–15.6)
WBC: 9.9 10*3/uL (ref 4.0–10.3)

## 2017-12-27 LAB — BASIC METABOLIC PANEL
Anion gap: 13 — ABNORMAL HIGH (ref 3–11)
BUN: 11 mg/dL (ref 7–26)
CHLORIDE: 101 mmol/L (ref 98–109)
CO2: 20 mmol/L — ABNORMAL LOW (ref 22–29)
Calcium: 9.1 mg/dL (ref 8.4–10.4)
Creatinine, Ser: 1.07 mg/dL (ref 0.70–1.30)
GFR calc Af Amer: >60 mL/min (ref >60)
GFR calc non Af Amer: >60 mL/min (ref >60)
Glucose, Bld: 286 mg/dL — ABNORMAL HIGH (ref 70–140)
POTASSIUM: 4 mmol/L (ref 3.5–5.1)
SODIUM: 134 mmol/L — AB (ref 136–145)

## 2017-12-27 MED ORDER — PROCHLORPERAZINE MALEATE 10 MG PO TABS: 10.0000 mg | ORAL_TABLET | Freq: Once | ORAL | Status: DC

## 2017-12-27 MED ORDER — OXYCODONE-ACETAMINOPHEN 5-325 MG PO TABS: 1.0000 | ORAL_TABLET | Freq: Four times a day (QID) | ORAL | 0 refills | Status: DC | PRN

## 2017-12-27 MED ORDER — BORTEZOMIB CHEMO SQ INJECTION 3.5 MG (2.5MG/ML)
1.3000 mg/m2 | Freq: Once | INTRAMUSCULAR | Status: AC
  Administered 2017-12-27: 3.25 mg via SUBCUTANEOUS
  Filled 2017-12-27: qty 3.25

## 2017-12-27 NOTE — Patient Instructions (Addendum)
[facility] Discharge Instructions for Patients Receiving Chemotherapy  Today you received the following chemotherapy agents: Bortezomib (Velcade)  To help prevent nausea and vomiting after your treatment, we encourage you to take your nausea medication  as prescribed.    If you develop nausea and vomiting that is not controlled by your nausea medication, call the clinic.   BELOW ARE SYMPTOMS THAT SHOULD BE REPORTED IMMEDIATELY:  *FEVER GREATER THAN 100.5 F  *CHILLS WITH OR WITHOUT FEVER  NAUSEA AND VOMITING THAT IS NOT CONTROLLED WITH YOUR NAUSEA MEDICATION  *UNUSUAL SHORTNESS OF BREATH  *UNUSUAL BRUISING OR BLEEDING  TENDERNESS IN MOUTH AND THROAT WITH OR WITHOUT PRESENCE OF ULCERS  *URINARY PROBLEMS  *BOWEL PROBLEMS  UNUSUAL RASH Items with * indicate a potential emergency and should be followed up as soon as possible.  Feel free to call the clinic should you have any questions or concerns. The clinic phone number is [phone].  Please show the CHEMO ALERT CARD at check-in to the Emergency Department and triage nurse.   

## 2017-12-27 NOTE — Progress Notes (Signed)
Per Dr. Benay Spice: OK to treat with just BMP not CMP

## 2017-12-29 ENCOUNTER — Encounter: Payer: Self-pay | Admitting: Nurse Practitioner

## 2017-12-30 ENCOUNTER — Encounter: Payer: Self-pay | Admitting: Nurse Practitioner

## 2018-01-06 ENCOUNTER — Other Ambulatory Visit: Payer: Self-pay | Admitting: *Deleted

## 2018-01-06 DIAGNOSIS — C9000 Multiple myeloma not having achieved remission: Secondary | ICD-10-CM

## 2018-01-06 MED ORDER — LENALIDOMIDE 25 MG PO CAPS: ORAL_CAPSULE | ORAL | 0 refills | Status: DC

## 2018-01-10 ENCOUNTER — Other Ambulatory Visit: Payer: BLUE CROSS/BLUE SHIELD

## 2018-01-10 ENCOUNTER — Ambulatory Visit: Payer: BLUE CROSS/BLUE SHIELD | Admitting: Oncology

## 2018-01-10 ENCOUNTER — Ambulatory Visit: Payer: BLUE CROSS/BLUE SHIELD

## 2018-01-12 ENCOUNTER — Other Ambulatory Visit: Payer: Self-pay | Admitting: Oncology

## 2018-01-13 ENCOUNTER — Other Ambulatory Visit: Payer: Self-pay | Admitting: Nurse Practitioner

## 2018-01-13 DIAGNOSIS — C9001 Multiple myeloma in remission: Secondary | ICD-10-CM | POA: Insufficient documentation

## 2018-01-13 DIAGNOSIS — C9000 Multiple myeloma not having achieved remission: Secondary | ICD-10-CM

## 2018-01-14 ENCOUNTER — Inpatient Hospital Stay: Payer: BLUE CROSS/BLUE SHIELD

## 2018-01-14 ENCOUNTER — Encounter: Payer: Self-pay | Admitting: Nurse Practitioner

## 2018-01-14 ENCOUNTER — Telehealth: Payer: Self-pay

## 2018-01-14 ENCOUNTER — Inpatient Hospital Stay: Payer: BLUE CROSS/BLUE SHIELD | Attending: Nurse Practitioner | Admitting: Oncology

## 2018-01-14 VITALS — BP 157/88 | HR 78 | Temp 98.3°F | Resp 20 | Ht 74.0 in | Wt 262.3 lb

## 2018-01-14 DIAGNOSIS — C9000 Multiple myeloma not having achieved remission: Secondary | ICD-10-CM | POA: Diagnosis not present

## 2018-01-14 DIAGNOSIS — G893 Neoplasm related pain (acute) (chronic): Secondary | ICD-10-CM | POA: Diagnosis not present

## 2018-01-14 DIAGNOSIS — M549 Dorsalgia, unspecified: Secondary | ICD-10-CM

## 2018-01-14 DIAGNOSIS — F329 Major depressive disorder, single episode, unspecified: Secondary | ICD-10-CM

## 2018-01-14 DIAGNOSIS — Z5112 Encounter for antineoplastic immunotherapy: Secondary | ICD-10-CM | POA: Diagnosis not present

## 2018-01-14 LAB — CBC WITH DIFFERENTIAL (CANCER CENTER ONLY)
Basophils Absolute: 0.1 10*3/uL (ref 0.0–0.1)
Basophils Relative: 3 %
EOS ABS: 0.1 10*3/uL (ref 0.0–0.5)
Eosinophils Relative: 2 %
HEMATOCRIT: 45.6 % (ref 38.4–49.9)
HEMOGLOBIN: 15.5 g/dL (ref 13.0–17.1)
LYMPHS ABS: 0.8 10*3/uL — AB (ref 0.9–3.3)
Lymphocytes Relative: 17 %
MCH: 30.7 pg (ref 27.2–33.4)
MCHC: 34 g/dL (ref 32.0–36.0)
MCV: 90.3 fL (ref 79.3–98.0)
MONOS PCT: 7 %
Monocytes Absolute: 0.4 10*3/uL (ref 0.1–0.9)
NEUTROS ABS: 3.6 10*3/uL (ref 1.5–6.5)
NEUTROS PCT: 71 %
Platelet Count: 204 10*3/uL (ref 140–400)
RBC: 5.05 MIL/uL (ref 4.20–5.82)
RDW: 14.8 % — ABNORMAL HIGH (ref 11.0–14.6)
WBC: 5 10*3/uL (ref 4.0–10.3)

## 2018-01-14 LAB — CMP (CANCER CENTER ONLY)
ALT: 33 U/L (ref 0–55)
ANION GAP: 11 (ref 3–11)
AST: 19 U/L (ref 5–34)
Albumin: 3.9 g/dL (ref 3.5–5.0)
Alkaline Phosphatase: 133 U/L (ref 40–150)
BUN: 10 mg/dL (ref 7–26)
CHLORIDE: 102 mmol/L (ref 98–109)
CO2: 24 mmol/L (ref 22–29)
Calcium: 9 mg/dL (ref 8.4–10.4)
Creatinine: 1.04 mg/dL (ref 0.70–1.30)
Glucose, Bld: 179 mg/dL — ABNORMAL HIGH (ref 70–140)
POTASSIUM: 4 mmol/L (ref 3.5–5.1)
SODIUM: 137 mmol/L (ref 136–145)
Total Bilirubin: 0.4 mg/dL (ref 0.2–1.2)
Total Protein: 7.2 g/dL (ref 6.4–8.3)

## 2018-01-14 MED ORDER — OXYCODONE-ACETAMINOPHEN 5-325 MG PO TABS: 1.0000 | ORAL_TABLET | Freq: Four times a day (QID) | ORAL | 0 refills | Status: DC | PRN

## 2018-01-14 MED ORDER — BORTEZOMIB CHEMO SQ INJECTION 3.5 MG (2.5MG/ML)
1.3000 mg/m2 | Freq: Once | INTRAMUSCULAR | Status: AC
  Administered 2018-01-14: 3.25 mg via SUBCUTANEOUS
  Filled 2018-01-14: qty 3.25

## 2018-01-14 MED ORDER — PROCHLORPERAZINE MALEATE 10 MG PO TABS: 10.0000 mg | ORAL_TABLET | Freq: Once | ORAL | Status: DC

## 2018-01-14 NOTE — Patient Instructions (Signed)
[facility] Discharge Instructions for Patients Receiving Chemotherapy  Today you received the following chemotherapy agents: Bortezomib (Velcade)  To help prevent nausea and vomiting after your treatment, we encourage you to take your nausea medication  as prescribed.    If you develop nausea and vomiting that is not controlled by your nausea medication, call the clinic.   BELOW ARE SYMPTOMS THAT SHOULD BE REPORTED IMMEDIATELY:  *FEVER GREATER THAN 100.5 F  *CHILLS WITH OR WITHOUT FEVER  NAUSEA AND VOMITING THAT IS NOT CONTROLLED WITH YOUR NAUSEA MEDICATION  *UNUSUAL SHORTNESS OF BREATH  *UNUSUAL BRUISING OR BLEEDING  TENDERNESS IN MOUTH AND THROAT WITH OR WITHOUT PRESENCE OF ULCERS  *URINARY PROBLEMS  *BOWEL PROBLEMS  UNUSUAL RASH Items with * indicate a potential emergency and should be followed up as soon as possible.  Feel free to call the clinic should you have any questions or concerns. The clinic phone number is [phone].  Please show the CHEMO ALERT CARD at check-in to the Emergency Department and triage nurse.   

## 2018-01-14 NOTE — Addendum Note (Signed)
Addended by: Sherril Croon on: 01/14/2018 10:33 AM   Modules accepted: Orders

## 2018-01-14 NOTE — Progress Notes (Signed)
[facility] OFFICE PROGRESS NOTE   Diagnosis: Multiple myeloma  INTERVAL HISTORY:   Mr. Probert returns as scheduled.  He completed a third cycle of RVD beginning 12/13/2017.  He reports tolerating the treatment well.  No neuropathy symptoms.  No nausea.  No significant diarrhea.  The back pain is much improved.  He is now taking oxycodone twice daily. He was seen for a transplant evaluation at [facility] yesterday.  He reports the recommendation is to proceed with RVD chemotherapy followed by stem cell therapy.  Objective:  Vital signs in last 24 hours:  Blood pressure (!) 157/88, pulse 78, temperature 98.3 F (36.8 C), temperature source Oral, resp. rate 20, height 6' 2" (1.88 m), weight 262 lb 4.8 oz (119 kg), SpO2 97 %.    HEENT: No thrush Resp: Lungs clear bilaterally Cardio: Regular rate and rhythm GI: No hepatosplenomegaly, nontender Vascular: Leg edema    Lab Results:  Lab Results  Component Value Date   WBC 5.0 01/14/2018   HGB 14.9 12/20/2017   HCT 45.6 01/14/2018   MCV 90.3 01/14/2018   PLT 204 01/14/2018   NEUTROABS 3.6 01/14/2018    CMP     Component Value Date/Time   NA 134 (L) 12/27/2017 0941   NA 136 11/29/2017 0935   K 4.0 12/27/2017 0941   K 3.5 11/29/2017 0935   CL 101 12/27/2017 0941   CO2 20 (L) 12/27/2017 0941   CO2 22 11/29/2017 0935   GLUCOSE 286 (H) 12/27/2017 0941   GLUCOSE 232 (H) 11/29/2017 0935   BUN 11 12/27/2017 0941   BUN 11.3 11/29/2017 0935   CREATININE 1.07 12/27/2017 0941   CREATININE 0.9 11/29/2017 0935   CALCIUM 9.1 12/27/2017 0941   CALCIUM 8.5 11/29/2017 0935   PROT 8.1 12/13/2017 0916   PROT 8.2 11/15/2017 0810   PROT 8.5 (H) 11/15/2017 0810   ALBUMIN 3.8 12/13/2017 0916   ALBUMIN 3.3 (L) 11/15/2017 0810   AST 21 12/13/2017 0916   AST 20 11/15/2017 0810   ALT 36 12/13/2017 0916   ALT 39 11/15/2017 0810   ALKPHOS 128 12/13/2017 0916   ALKPHOS 100 11/15/2017 0810   BILITOT 0.5 12/13/2017 0916     BILITOT 0.45 11/15/2017 0810   GFRNONAA >60 12/27/2017 0941   GFRAA >60 12/27/2017 0941   12/13/2017: Serum M spike 1.5, IgG 1814, lambda free light chains 175  Medications: I have reviewed the patient's current medications.   Assessment/Plan: 1.Multiple myeloma-IgG lambda serum monoclonal protein, elevated serum lambda light chains  Bone survey 10/11/2017-lytic lesions noted in the skull, right iliac, left second rib, and mottled appearance of the proximal femurs/pelvis  Bone marrow biopsy 10/14/2017-hypercellular marrow with plasma cell neoplasm, 82% plasma cells, lambda light chain restricted, hyperdiploid with gains of chromosomes 3, 5, 7, 9, and 11. FISH panel positive for gain of ATM(+11)  Cycle 1 RVD 10/18/2017 (Revlimid started 10/23/2017)  Cycle 2 RVD 11/15/2017  Cycle 3 RVD 12/13/2017  Cycle 4 RVD 01/14/2018  2.Pain secondary to multiple myeloma involving the spine and pelvis  MRI of the lumbar spine 10/02/2018-numerous rounded foci in the vertebral bodies, hypertrophy of the L4 transverse process  3.Hypertension-losartan dose increased 11/15/2017  4.Depression   Disposition: Mr. Scalzo has completed 3 cycles of RVD.  He has tolerated the treatment well.  The serum M spike and elevated serum light chains are improved.  He has pain is improved.  The plan is to proceed with cycle 4 RVD today.  He will  complete another treatment with Zometa when he returns in March.  Mr. Leece will return for an office visit prior to cycle 5 of RVD on 02/21/2018.  25 minutes were spent with the patient today.  The majority of the time was used for counseling and coordination of care.       Betsy Coder, MD  01/14/2018  8:26 AM

## 2018-01-14 NOTE — Addendum Note (Signed)
Addended by: Betsy Coder B on: 01/14/2018 09:22 AM   Modules accepted: Orders

## 2018-01-14 NOTE — Telephone Encounter (Signed)
Spoke with Linus Orn at [facility] to receive 10/07/17 MSpike results on SPEP. Results received via fax. Pt ad wife notified. Results faxed to [facility].

## 2018-01-15 ENCOUNTER — Telehealth: Payer: Self-pay | Admitting: Oncology

## 2018-01-15 ENCOUNTER — Encounter: Payer: Self-pay | Admitting: Nurse Practitioner

## 2018-01-15 LAB — IGG: IGG (IMMUNOGLOBIN G), SERUM: 1066 mg/dL (ref 700–1600)

## 2018-01-15 LAB — KAPPA/LAMBDA LIGHT CHAINS
KAPPA FREE LGHT CHN: 10.8 mg/L (ref 3.3–19.4)
Kappa, lambda light chain ratio: 0.25 — ABNORMAL LOW (ref 0.26–1.65)
LAMDA FREE LIGHT CHAINS: 42.4 mg/L — AB (ref 5.7–26.3)

## 2018-01-15 NOTE — Telephone Encounter (Signed)
Scheduled appt per 2/12 los - Patient is aware of appts added - will pick up an updated schedule next visit on 2/19

## 2018-01-16 LAB — PROTEIN ELECTROPHORESIS, SERUM
A/G Ratio: 1.3 (ref 0.7–1.7)
ALPHA-2-GLOBULIN: 0.8 g/dL (ref 0.4–1.0)
Albumin ELP: 4 g/dL (ref 2.9–4.4)
Alpha-1-Globulin: 0.2 g/dL (ref 0.0–0.4)
Beta Globulin: 1 g/dL (ref 0.7–1.3)
GAMMA GLOBULIN: 1 g/dL (ref 0.4–1.8)
Globulin, Total: 3 g/dL (ref 2.2–3.9)
M-SPIKE, %: 0.7 g/dL — AB
Total Protein ELP: 7 g/dL (ref 6.0–8.5)

## 2018-01-20 ENCOUNTER — Other Ambulatory Visit: Payer: Self-pay | Admitting: Oncology

## 2018-01-21 ENCOUNTER — Inpatient Hospital Stay: Payer: BLUE CROSS/BLUE SHIELD

## 2018-01-21 ENCOUNTER — Other Ambulatory Visit: Payer: Self-pay | Admitting: Emergency Medicine

## 2018-01-21 ENCOUNTER — Encounter: Payer: Self-pay | Admitting: Nurse Practitioner

## 2018-01-21 VITALS — BP 169/97 | HR 70 | Temp 98.3°F | Resp 18

## 2018-01-21 DIAGNOSIS — C9000 Multiple myeloma not having achieved remission: Secondary | ICD-10-CM

## 2018-01-21 DIAGNOSIS — Z5112 Encounter for antineoplastic immunotherapy: Secondary | ICD-10-CM | POA: Diagnosis not present

## 2018-01-21 LAB — CBC WITH DIFFERENTIAL/PLATELET
Basophils Absolute: 0 10*3/uL (ref 0.0–0.1)
Basophils Relative: 0 %
EOS PCT: 1 %
Eosinophils Absolute: 0.1 10*3/uL (ref 0.0–0.5)
HEMATOCRIT: 46.7 % (ref 38.4–49.9)
HEMOGLOBIN: 16.1 g/dL (ref 13.0–17.1)
LYMPHS ABS: 0.7 10*3/uL — AB (ref 0.9–3.3)
LYMPHS PCT: 8 %
MCH: 31.1 pg (ref 27.2–33.4)
MCHC: 34.5 g/dL (ref 32.0–36.0)
MCV: 90.2 fL (ref 79.3–98.0)
Monocytes Absolute: 0.5 10*3/uL (ref 0.1–0.9)
Monocytes Relative: 5 %
NEUTROS PCT: 86 %
NRBC: 0 /100{WBCs}
Neutro Abs: 7.6 10*3/uL — ABNORMAL HIGH (ref 1.5–6.5)
Platelets: 178 10*3/uL (ref 140–400)
RBC: 5.18 MIL/uL (ref 4.20–5.82)
RDW: 13.8 % (ref 11.0–14.6)
WBC: 8.9 10*3/uL (ref 4.0–10.3)

## 2018-01-21 MED ORDER — BORTEZOMIB CHEMO SQ INJECTION 3.5 MG (2.5MG/ML)
1.3000 mg/m2 | Freq: Once | INTRAMUSCULAR | Status: AC
  Administered 2018-01-21: 3.25 mg via SUBCUTANEOUS
  Filled 2018-01-21: qty 3.25

## 2018-01-21 MED ORDER — PROCHLORPERAZINE MALEATE 10 MG PO TABS: 10.0000 mg | ORAL_TABLET | Freq: Once | ORAL | Status: DC

## 2018-01-21 NOTE — Patient Instructions (Signed)
[facility] Discharge Instructions for Patients Receiving Chemotherapy  Today you received the following chemotherapy agents: Bortezomib (Velcade)  To help prevent nausea and vomiting after your treatment, we encourage you to take your nausea medication  as prescribed.    If you develop nausea and vomiting that is not controlled by your nausea medication, call the clinic.   BELOW ARE SYMPTOMS THAT SHOULD BE REPORTED IMMEDIATELY:  *FEVER GREATER THAN 100.5 F  *CHILLS WITH OR WITHOUT FEVER  NAUSEA AND VOMITING THAT IS NOT CONTROLLED WITH YOUR NAUSEA MEDICATION  *UNUSUAL SHORTNESS OF BREATH  *UNUSUAL BRUISING OR BLEEDING  TENDERNESS IN MOUTH AND THROAT WITH OR WITHOUT PRESENCE OF ULCERS  *URINARY PROBLEMS  *BOWEL PROBLEMS  UNUSUAL RASH Items with * indicate a potential emergency and should be followed up as soon as possible.  Feel free to call the clinic should you have any questions or concerns. The clinic phone number is [phone].  Please show the CHEMO ALERT CARD at check-in to the Emergency Department and triage nurse.   

## 2018-01-21 NOTE — Progress Notes (Signed)
CBC only drawn today. Per Dr. Benay Spice, [place] to treat with CMP from 01/14/18.

## 2018-01-27 ENCOUNTER — Other Ambulatory Visit: Payer: Self-pay

## 2018-01-27 DIAGNOSIS — C9000 Multiple myeloma not having achieved remission: Secondary | ICD-10-CM

## 2018-01-28 ENCOUNTER — Other Ambulatory Visit: Payer: Self-pay

## 2018-01-28 ENCOUNTER — Inpatient Hospital Stay: Payer: BLUE CROSS/BLUE SHIELD

## 2018-01-28 VITALS — BP 159/92 | HR 75 | Temp 97.8°F | Resp 16

## 2018-01-28 DIAGNOSIS — C9000 Multiple myeloma not having achieved remission: Secondary | ICD-10-CM

## 2018-01-28 DIAGNOSIS — Z5112 Encounter for antineoplastic immunotherapy: Secondary | ICD-10-CM | POA: Diagnosis not present

## 2018-01-28 LAB — CBC WITH DIFFERENTIAL (CANCER CENTER ONLY)
BASOS ABS: 0 10*3/uL (ref 0.0–0.1)
Basophils Relative: 0 %
EOS PCT: 2 %
Eosinophils Absolute: 0.1 10*3/uL (ref 0.0–0.5)
HCT: 46.9 % (ref 38.4–49.9)
Hemoglobin: 16.1 g/dL (ref 13.0–17.1)
LYMPHS PCT: 9 %
Lymphs Abs: 0.7 10*3/uL — ABNORMAL LOW (ref 0.9–3.3)
MCH: 31.1 pg (ref 27.2–33.4)
MCHC: 34.3 g/dL (ref 32.0–36.0)
MCV: 90.5 fL (ref 79.3–98.0)
Monocytes Absolute: 0.5 10*3/uL (ref 0.1–0.9)
Monocytes Relative: 6 %
Neutro Abs: 6.6 10*3/uL — ABNORMAL HIGH (ref 1.5–6.5)
Neutrophils Relative %: 83 %
PLATELETS: 162 10*3/uL (ref 140–400)
RBC: 5.18 MIL/uL (ref 4.20–5.82)
RDW: 13.9 % (ref 11.0–14.6)
WBC: 8 10*3/uL (ref 4.0–10.3)

## 2018-01-28 LAB — CMP (CANCER CENTER ONLY)
ALT: 57 U/L — AB (ref 0–55)
AST: 23 U/L (ref 5–34)
Albumin: 3.8 g/dL (ref 3.5–5.0)
Alkaline Phosphatase: 148 U/L (ref 40–150)
Anion gap: 12 — ABNORMAL HIGH (ref 3–11)
BUN: 7 mg/dL (ref 7–26)
CHLORIDE: 103 mmol/L (ref 98–109)
CO2: 23 mmol/L (ref 22–29)
CREATININE: 0.92 mg/dL (ref 0.70–1.30)
Calcium: 8.9 mg/dL (ref 8.4–10.4)
GFR, Est AFR Am: >60 mL/min (ref >60)
GLUCOSE: 215 mg/dL — AB (ref 70–140)
Potassium: 3.7 mmol/L (ref 3.5–5.1)
SODIUM: 138 mmol/L (ref 136–145)
Total Bilirubin: 0.5 mg/dL (ref 0.2–1.2)
Total Protein: 7 g/dL (ref 6.4–8.3)

## 2018-01-28 MED ORDER — BORTEZOMIB CHEMO SQ INJECTION 3.5 MG (2.5MG/ML)
1.3000 mg/m2 | Freq: Once | INTRAMUSCULAR | Status: AC
Start: 2018-01-28 — End: 2018-01-28
  Administered 2018-01-28: 3.25 mg via SUBCUTANEOUS
  Filled 2018-01-28: qty 3.25

## 2018-01-28 MED ORDER — PROCHLORPERAZINE MALEATE 10 MG PO TABS: 10.0000 mg | ORAL_TABLET | Freq: Once | ORAL | Status: DC

## 2018-01-28 NOTE — Patient Instructions (Signed)
[facility] Discharge Instructions for Patients Receiving Chemotherapy  Today you received the following chemotherapy agents Velcade.  To help prevent nausea and vomiting after your treatment, we encourage you to take your nausea medication as directed.  If you develop nausea and vomiting that is not controlled by your nausea medication, call the clinic.   BELOW ARE SYMPTOMS THAT SHOULD BE REPORTED IMMEDIATELY:  *FEVER GREATER THAN 100.5 F  *CHILLS WITH OR WITHOUT FEVER  NAUSEA AND VOMITING THAT IS NOT CONTROLLED WITH YOUR NAUSEA MEDICATION  *UNUSUAL SHORTNESS OF BREATH  *UNUSUAL BRUISING OR BLEEDING  TENDERNESS IN MOUTH AND THROAT WITH OR WITHOUT PRESENCE OF ULCERS  *URINARY PROBLEMS  *BOWEL PROBLEMS  UNUSUAL RASH Items with * indicate a potential emergency and should be followed up as soon as possible.  Feel free to call the clinic should you have any questions or concerns. The clinic phone number is [phone].  Please show the CHEMO ALERT CARD at check-in to the Emergency Department and triage nurse.   

## 2018-01-29 ENCOUNTER — Other Ambulatory Visit: Payer: Self-pay

## 2018-01-29 ENCOUNTER — Encounter: Payer: Self-pay | Admitting: Nurse Practitioner

## 2018-01-29 DIAGNOSIS — C9000 Multiple myeloma not having achieved remission: Secondary | ICD-10-CM

## 2018-01-29 MED ORDER — LENALIDOMIDE 25 MG PO CAPS: ORAL_CAPSULE | ORAL | 0 refills | Status: DC

## 2018-01-31 NOTE — Telephone Encounter (Signed)
Photos reviewed by Dr. Benay Spice: Will need to be evaluated in the office. Called wife, he can not come in this afternoon. Appt given for 3/4 @ 1130.

## 2018-02-03 ENCOUNTER — Inpatient Hospital Stay: Payer: BLUE CROSS/BLUE SHIELD | Attending: Nurse Practitioner | Admitting: Oncology

## 2018-02-03 ENCOUNTER — Telehealth: Payer: Self-pay | Admitting: Oncology

## 2018-02-03 VITALS — BP 155/87 | HR 78 | Temp 98.7°F | Resp 18 | Ht 74.0 in | Wt 260.0 lb

## 2018-02-03 DIAGNOSIS — C9000 Multiple myeloma not having achieved remission: Secondary | ICD-10-CM | POA: Diagnosis not present

## 2018-02-03 DIAGNOSIS — F329 Major depressive disorder, single episode, unspecified: Secondary | ICD-10-CM | POA: Diagnosis not present

## 2018-02-03 DIAGNOSIS — G893 Neoplasm related pain (acute) (chronic): Secondary | ICD-10-CM | POA: Diagnosis not present

## 2018-02-03 DIAGNOSIS — Z5112 Encounter for antineoplastic immunotherapy: Secondary | ICD-10-CM | POA: Diagnosis present

## 2018-02-03 NOTE — Progress Notes (Signed)
[place] OFFICE PROGRESS NOTE   Diagnosis: Multiple myeloma  INTERVAL HISTORY:   Steve Carter returns prior to a scheduled visit.  He began cycle 4 RVD on 01/14/2018.  He currently has a "cold "with sinus congestion/drainage.  No fever.  He has noted fullness at the right side of the face. He saw Dr. Melburn Carter on 01/13/2018.  He is felt to be a stem cell transplant candidate.  The back and leg pain continued to improve.  He has not taken pain medication today.  Objective:  Vital signs in last 24 hours:  Blood pressure (!) 155/87, pulse 78, temperature 98.7 F (37.1 C), temperature source Oral, resp. rate 18, height '6\' 2"'$  (1.88 m), weight 260 lb (117.9 kg), SpO2 98 %.    HEENT: Mild white coat over the tongue, no buccal thrush.  Soft fullness at the preauricular area bilaterally.  No tenderness or fluctuance. Resp: Lungs clear bilaterally Cardio: Regular rate and rhythm GI: No hepatosplenomegaly, no mass, nontender Vascular: No leg edema  Skin: Rubror of the face   Lab Results:  Lab Results  Component Value Date   WBC 8.0 01/28/2018   HGB 16.1 01/21/2018   HCT 46.9 01/28/2018   MCV 90.5 01/28/2018   PLT 162 01/28/2018   NEUTROABS 6.6 (H) 01/28/2018    CMP     Component Value Date/Time   NA 138 01/28/2018 0801   NA 136 11/29/2017 0935   K 3.7 01/28/2018 0801   K 3.5 11/29/2017 0935   CL 103 01/28/2018 0801   CO2 23 01/28/2018 0801   CO2 22 11/29/2017 0935   GLUCOSE 215 (H) 01/28/2018 0801   GLUCOSE 232 (H) 11/29/2017 0935   BUN 7 01/28/2018 0801   BUN 11.3 11/29/2017 0935   CREATININE 0.92 01/28/2018 0801   CREATININE 0.9 11/29/2017 0935   CALCIUM 8.9 01/28/2018 0801   CALCIUM 8.5 11/29/2017 0935   PROT 7.0 01/28/2018 0801   PROT 8.2 11/15/2017 0810   PROT 8.5 (H) 11/15/2017 0810   ALBUMIN 3.8 01/28/2018 0801   ALBUMIN 3.3 (L) 11/15/2017 0810   AST 23 01/28/2018 0801   AST 20 11/15/2017 0810   ALT 57 (H) 01/28/2018 0801   ALT 39  11/15/2017 0810   ALKPHOS 148 01/28/2018 0801   ALKPHOS 100 11/15/2017 0810   BILITOT 0.5 01/28/2018 0801   BILITOT 0.45 11/15/2017 0810   GFRNONAA >60 01/28/2018 0801   GFRAA >60 01/28/2018 0801   Serum M spike on 212 2019-0 0.7, lambda free light chains 42.4  Medications: I have reviewed the patient's current medications.   Assessment/Plan: 1.Multiple myeloma-IgG lambda serum monoclonal protein, elevated serum lambda light chains  Bone survey 10/11/2017-lytic lesions noted in the skull, right iliac, left second rib, and mottled appearance of the proximal femurs/pelvis  Bone marrow biopsy 10/14/2017-hypercellular marrow with plasma cell neoplasm, 82% plasma cells, lambda light chain restricted, hyperdiploid with gains of chromosomes 3, 5, 7, 9, and 11. FISH panel positive for gain of ATM(+11)  Cycle 1 RVD 10/18/2017 (Revlimid started 10/23/2017)  Cycle 2 RVD 11/15/2017  Cycle 3 RVD 12/13/2017  Cycle 4 RVD 01/14/2018  2.Pain secondary to multiple myeloma involving the spine and pelvis  MRI of the lumbar spine 10/02/2018-numerous rounded foci in the vertebral bodies, hypertrophy of the L4 transverse process  3.Hypertension-losartan dose increased 11/15/2017  4.Depression     Disposition: [name] has completed 4 cycles of RVD.  His clinical status and the myeloma protein have improved.  He  will return for office visit, a myeloma panel, and Zometa on 02/14/2018.  The plan is to begin cycle 5 of RVD on 02/14/2018.  We will refer him to Dr. Melburn Carter if the M protein plateaus.  The facial fullness is likely related to cushingoid change from Decadron.  I doubt this is related to an infection or plasmacytoma.  15 minutes were spent with the patient today.  The majority of the time was used for counseling and coordination of care.  Steve Coder, MD  02/03/2018  12:02 PM

## 2018-02-03 NOTE — Telephone Encounter (Signed)
F/u already scheduled per 3/4 los.

## 2018-02-04 ENCOUNTER — Other Ambulatory Visit: Payer: Self-pay | Admitting: *Deleted

## 2018-02-04 DIAGNOSIS — C9000 Multiple myeloma not having achieved remission: Secondary | ICD-10-CM

## 2018-02-04 MED ORDER — DEXAMETHASONE 4 MG PO TABS: 40.0000 mg | ORAL_TABLET | ORAL | 0 refills | Status: DC

## 2018-02-14 ENCOUNTER — Inpatient Hospital Stay: Payer: BLUE CROSS/BLUE SHIELD

## 2018-02-14 ENCOUNTER — Inpatient Hospital Stay: Payer: BLUE CROSS/BLUE SHIELD | Admitting: Oncology

## 2018-02-14 ENCOUNTER — Encounter: Payer: Self-pay | Admitting: Oncology

## 2018-02-14 VITALS — BP 166/90 | HR 91 | Temp 98.1°F | Resp 17 | Ht 74.0 in | Wt 260.9 lb

## 2018-02-14 DIAGNOSIS — C9000 Multiple myeloma not having achieved remission: Secondary | ICD-10-CM

## 2018-02-14 DIAGNOSIS — M549 Dorsalgia, unspecified: Secondary | ICD-10-CM

## 2018-02-14 DIAGNOSIS — M5489 Other dorsalgia: Secondary | ICD-10-CM

## 2018-02-14 LAB — CBC WITH DIFFERENTIAL (CANCER CENTER ONLY)
BASOS PCT: 0 %
Basophils Absolute: 0 10*3/uL (ref 0.0–0.1)
EOS ABS: 0 10*3/uL (ref 0.0–0.5)
EOS PCT: 0 %
HCT: 46.2 % (ref 38.4–49.9)
HEMOGLOBIN: 16.2 g/dL (ref 13.0–17.1)
Lymphocytes Relative: 9 %
Lymphs Abs: 0.5 10*3/uL — ABNORMAL LOW (ref 0.9–3.3)
MCH: 31.2 pg (ref 27.2–33.4)
MCHC: 35.1 g/dL (ref 32.0–36.0)
MCV: 88.8 fL (ref 79.3–98.0)
MONOS PCT: 2 %
Monocytes Absolute: 0.1 10*3/uL (ref 0.1–0.9)
Neutro Abs: 5.4 10*3/uL (ref 1.5–6.5)
Neutrophils Relative %: 89 %
PLATELETS: 205 10*3/uL (ref 140–400)
RBC: 5.2 MIL/uL (ref 4.20–5.82)
RDW: 13.7 % (ref 11.0–14.6)
WBC: 6.1 10*3/uL (ref 4.0–10.3)

## 2018-02-14 LAB — CMP (CANCER CENTER ONLY)
ALBUMIN: 4 g/dL (ref 3.5–5.0)
ALT: 33 U/L (ref 0–55)
ANION GAP: 12 — AB (ref 3–11)
AST: 20 U/L (ref 5–34)
Alkaline Phosphatase: 172 U/L — ABNORMAL HIGH (ref 40–150)
BILIRUBIN TOTAL: 0.7 mg/dL (ref 0.2–1.2)
BUN: 9 mg/dL (ref 7–26)
CALCIUM: 9.1 mg/dL (ref 8.4–10.4)
CHLORIDE: 102 mmol/L (ref 98–109)
CO2: 21 mmol/L — ABNORMAL LOW (ref 22–29)
Creatinine: 1.01 mg/dL (ref 0.70–1.30)
GFR, Est AFR Am: >60 mL/min (ref >60)
GFR, Estimated: >60 mL/min (ref >60)
GLUCOSE: 295 mg/dL — AB (ref 70–140)
POTASSIUM: 3.6 mmol/L (ref 3.5–5.1)
SODIUM: 135 mmol/L — AB (ref 136–145)
Total Protein: 7.2 g/dL (ref 6.4–8.3)

## 2018-02-14 MED ORDER — SODIUM CHLORIDE 0.9 % IV SOLN
Freq: Once | INTRAVENOUS | Status: AC
  Administered 2018-02-14: 12:00:00 via INTRAVENOUS

## 2018-02-14 MED ORDER — OXYCODONE-ACETAMINOPHEN 5-325 MG PO TABS: 1.0000 | ORAL_TABLET | Freq: Four times a day (QID) | ORAL | 0 refills | Status: DC | PRN

## 2018-02-14 MED ORDER — BORTEZOMIB CHEMO SQ INJECTION 3.5 MG (2.5MG/ML)
1.3000 mg/m2 | Freq: Once | INTRAMUSCULAR | Status: AC
  Administered 2018-02-14: 3.25 mg via SUBCUTANEOUS
  Filled 2018-02-14: qty 3.25

## 2018-02-14 MED ORDER — ZOLEDRONIC ACID 4 MG/100ML IV SOLN
4.0000 mg | Freq: Once | INTRAVENOUS | Status: AC
  Administered 2018-02-14: 4 mg via INTRAVENOUS
  Filled 2018-02-14: qty 100

## 2018-02-14 NOTE — Progress Notes (Signed)
[place] OFFICE PROGRESS NOTE   Diagnosis: Multiple myeloma  INTERVAL HISTORY:   Steve Carter returns as scheduled.  He has completed 4 cycles of RVD.  His pain is much improved.  He continues to take 2 Percocet tablets per day.  No neuropathy symptoms.  He has recovered from an upper respiratory infection.  Good appetite.  Objective:  Vital signs in last 24 hours:  Blood pressure (!) 166/90, pulse 91, temperature 98.1 F (36.7 C), temperature source Oral, resp. rate 17, height '6\' 2"'$  (1.88 m), weight 260 lb 14.4 oz (118.3 kg), SpO2 95 %.    HEENT: No thrush or ulcers Resp: Lungs clear bilaterally Cardio: Regular rate and rhythm GI: No hepatosplenomegaly Vascular: No leg edema      Lab Results:  Lab Results  Component Value Date   WBC 6.1 02/14/2018   HGB 16.1 01/21/2018   HCT 46.2 02/14/2018   MCV 88.8 02/14/2018   PLT 205 02/14/2018   NEUTROABS 5.4 02/14/2018    CMP     Component Value Date/Time   NA 135 (L) 02/14/2018 0945   NA 136 11/29/2017 0935   K 3.6 02/14/2018 0945   K 3.5 11/29/2017 0935   CL 102 02/14/2018 0945   CO2 21 (L) 02/14/2018 0945   CO2 22 11/29/2017 0935   GLUCOSE 295 (H) 02/14/2018 0945   GLUCOSE 232 (H) 11/29/2017 0935   BUN 9 02/14/2018 0945   BUN 11.3 11/29/2017 0935   CREATININE 1.01 02/14/2018 0945   CREATININE 0.9 11/29/2017 0935   CALCIUM 9.1 02/14/2018 0945   CALCIUM 8.5 11/29/2017 0935   PROT 7.2 02/14/2018 0945   PROT 8.2 11/15/2017 0810   PROT 8.5 (H) 11/15/2017 0810   ALBUMIN 4.0 02/14/2018 0945   ALBUMIN 3.3 (L) 11/15/2017 0810   AST 20 02/14/2018 0945   AST 20 11/15/2017 0810   ALT 33 02/14/2018 0945   ALT 39 11/15/2017 0810   ALKPHOS 172 (H) 02/14/2018 0945   ALKPHOS 100 11/15/2017 0810   BILITOT 0.7 02/14/2018 0945   BILITOT 0.45 11/15/2017 0810   GFRNONAA >60 02/14/2018 0945   GFRAA >60 02/14/2018 0945     Medications: I have reviewed the patient's current  medications.   Assessment/Plan: 1.Multiple myeloma-IgG lambda serum monoclonal protein, elevated serum lambda light chains  Bone survey 10/11/2017-lytic lesions noted in the skull, right iliac, left second rib, and mottled appearance of the proximal femurs/pelvis  Bone marrow biopsy 10/14/2017-hypercellular marrow with plasma cell neoplasm, 82% plasma cells, lambda light chain restricted, hyperdiploid with gains of chromosomes 3, 5, 7, 9, and 11. FISH panel positive for gain of ATM(+11)  Cycle 1 RVD 10/18/2017 (Revlimid started 10/23/2017)  Cycle 2 RVD 11/15/2017  Cycle 3 RVD 12/13/2017  Cycle 4 RVD 01/14/2018  Cycle 5 of RVD 02/14/2018 (Revlimid given for 7 days and 1 week of Velcade)  2.Pain secondary to multiple myeloma involving the spine and pelvis  MRI of the lumbar spine 10/02/2018-numerous rounded foci in the vertebral bodies, hypertrophy of the L4 transverse process  3.Hypertension-losartan dose increased 11/15/2017  4.Depression   Disposition: Mr. Tibbs appears well.  His clinical status has improved with the RVD.  He is scheduled for a pretransplant evaluation at [facility] next week.  The plan is to proceed with cycle 5 of RVD today.  This cycle will be abbreviated as he is scheduled to begin the stem cell process over the next few weeks.  He continues acyclovir and aspirin prophylaxis.  Mr.  Broadus will return for an office visit in 2 months.  I am available to see him sooner as needed.  I asked him to have the [facility] transplant team communicate the treatment schedule to [place].    Betsy Coder, MD  02/14/2018  10:43 AM

## 2018-02-14 NOTE — Progress Notes (Signed)
Patient reports taking home compazine prior to arrival to [facility].

## 2018-02-14 NOTE — Patient Instructions (Signed)
[facility] Discharge Instructions for Patients Receiving Chemotherapy  Today you received the following chemotherapy agents: Velcade and Zometa  To help prevent nausea and vomiting after your treatment, we encourage you to take your nausea medication as directed.   If you develop nausea and vomiting that is not controlled by your nausea medication, call the clinic.   BELOW ARE SYMPTOMS THAT SHOULD BE REPORTED IMMEDIATELY:  *FEVER GREATER THAN 100.5 F  *CHILLS WITH OR WITHOUT FEVER  NAUSEA AND VOMITING THAT IS NOT CONTROLLED WITH YOUR NAUSEA MEDICATION  *UNUSUAL SHORTNESS OF BREATH  *UNUSUAL BRUISING OR BLEEDING  TENDERNESS IN MOUTH AND THROAT WITH OR WITHOUT PRESENCE OF ULCERS  *URINARY PROBLEMS  *BOWEL PROBLEMS  UNUSUAL RASH Items with * indicate a potential emergency and should be followed up as soon as possible.  Feel free to call the clinic should you have any questions or concerns. The clinic phone number is [phone].  Please show the CHEMO ALERT CARD at check-in to the Emergency Department and triage nurse.   

## 2018-02-14 NOTE — Addendum Note (Signed)
Addended by: Sherril Croon on: 02/14/2018 11:26 AM   Modules accepted: Orders

## 2018-02-15 LAB — IGG: IgG (Immunoglobin G), Serum: 774 mg/dL (ref 700–1600)

## 2018-02-16 ENCOUNTER — Other Ambulatory Visit: Payer: Self-pay | Admitting: Oncology

## 2018-02-17 LAB — KAPPA/LAMBDA LIGHT CHAINS
Kappa free light chain: 8.9 mg/L (ref 3.3–19.4)
Kappa, lambda light chain ratio: 0.58 (ref 0.26–1.65)
Lambda free light chains: 15.3 mg/L (ref 5.7–26.3)

## 2018-02-18 LAB — PROTEIN ELECTROPHORESIS, SERUM
A/G Ratio: 1.2 (ref 0.7–1.7)
ALBUMIN ELP: 3.8 g/dL (ref 2.9–4.4)
ALPHA-1-GLOBULIN: 0.2 g/dL (ref 0.0–0.4)
Alpha-2-Globulin: 0.9 g/dL (ref 0.4–1.0)
Beta Globulin: 1 g/dL (ref 0.7–1.3)
GLOBULIN, TOTAL: 3.1 g/dL (ref 2.2–3.9)
Gamma Globulin: 0.8 g/dL (ref 0.4–1.8)
M-Spike, %: 0.3 g/dL — ABNORMAL HIGH
Total Protein ELP: 6.9 g/dL (ref 6.0–8.5)

## 2018-02-19 ENCOUNTER — Telehealth: Payer: Self-pay

## 2018-02-19 NOTE — Telephone Encounter (Signed)
Spoke with Wells Guiles at [facility]. She informed that after 3/22 pt will hold all tx (revlimid, velcade). Pt will go on 21st for transplant testing. Wells Guiles to fax over schedule for our office. This RN voiced understanding.

## 2018-02-21 ENCOUNTER — Inpatient Hospital Stay: Payer: BLUE CROSS/BLUE SHIELD

## 2018-02-21 VITALS — BP 150/79 | HR 69 | Temp 97.9°F | Resp 20

## 2018-02-21 DIAGNOSIS — C9000 Multiple myeloma not having achieved remission: Secondary | ICD-10-CM | POA: Diagnosis not present

## 2018-02-21 LAB — CBC WITH DIFFERENTIAL (CANCER CENTER ONLY)
BASOS ABS: 0 10*3/uL (ref 0.0–0.1)
BASOS PCT: 1 %
EOS ABS: 0.1 10*3/uL (ref 0.0–0.5)
Eosinophils Relative: 2 %
HCT: 47.5 % (ref 38.4–49.9)
Hemoglobin: 16 g/dL (ref 13.0–17.1)
LYMPHS PCT: 10 %
Lymphs Abs: 0.7 10*3/uL — ABNORMAL LOW (ref 0.9–3.3)
MCH: 30.2 pg (ref 27.2–33.4)
MCHC: 33.6 g/dL (ref 32.0–36.0)
MCV: 90.1 fL (ref 79.3–98.0)
Monocytes Absolute: 0.2 10*3/uL (ref 0.1–0.9)
Monocytes Relative: 3 %
Neutro Abs: 6.1 10*3/uL (ref 1.5–6.5)
Neutrophils Relative %: 84 %
PLATELETS: 208 10*3/uL (ref 140–400)
RBC: 5.27 MIL/uL (ref 4.20–5.82)
RDW: 14.8 % — ABNORMAL HIGH (ref 11.0–14.6)
WBC: 7.2 10*3/uL (ref 4.0–10.3)

## 2018-02-21 MED ORDER — BORTEZOMIB CHEMO SQ INJECTION 3.5 MG (2.5MG/ML)
1.3000 mg/m2 | Freq: Once | INTRAMUSCULAR | Status: AC
  Administered 2018-02-21: 3.25 mg via SUBCUTANEOUS
  Filled 2018-02-21: qty 3.25

## 2018-02-21 MED ORDER — PROCHLORPERAZINE MALEATE 10 MG PO TABS: 10.0000 mg | ORAL_TABLET | Freq: Once | ORAL | Status: DC

## 2018-02-21 NOTE — Progress Notes (Signed)
Per Lavella Lemons (RN for Dr. Benay Spice), okay to treat pt with no CMP labs today.

## 2018-02-21 NOTE — Patient Instructions (Signed)
[facility] Discharge Instructions for Patients Receiving Chemotherapy  Today you received the following chemotherapy agents Velcade.  To help prevent nausea and vomiting after your treatment, we encourage you to take your nausea medication as directed.  If you develop nausea and vomiting that is not controlled by your nausea medication, call the clinic.   BELOW ARE SYMPTOMS THAT SHOULD BE REPORTED IMMEDIATELY:  *FEVER GREATER THAN 100.5 F  *CHILLS WITH OR WITHOUT FEVER  NAUSEA AND VOMITING THAT IS NOT CONTROLLED WITH YOUR NAUSEA MEDICATION  *UNUSUAL SHORTNESS OF BREATH  *UNUSUAL BRUISING OR BLEEDING  TENDERNESS IN MOUTH AND THROAT WITH OR WITHOUT PRESENCE OF ULCERS  *URINARY PROBLEMS  *BOWEL PROBLEMS  UNUSUAL RASH Items with * indicate a potential emergency and should be followed up as soon as possible.  Feel free to call the clinic should you have any questions or concerns. The clinic phone number is [phone].  Please show the CHEMO ALERT CARD at check-in to the Emergency Department and triage nurse.   

## 2018-02-22 ENCOUNTER — Other Ambulatory Visit: Payer: Self-pay | Admitting: Oncology

## 2018-02-28 ENCOUNTER — Other Ambulatory Visit: Payer: BLUE CROSS/BLUE SHIELD

## 2018-02-28 ENCOUNTER — Ambulatory Visit: Payer: BLUE CROSS/BLUE SHIELD

## 2018-03-18 ENCOUNTER — Other Ambulatory Visit: Payer: Self-pay

## 2018-03-18 MED ORDER — LOSARTAN POTASSIUM 100 MG PO TABS: 100.0000 mg | ORAL_TABLET | Freq: Every day | ORAL | 1 refills | Status: DC

## 2018-03-20 DIAGNOSIS — E876 Hypokalemia: Secondary | ICD-10-CM | POA: Insufficient documentation

## 2018-04-08 ENCOUNTER — Other Ambulatory Visit: Payer: Self-pay

## 2018-04-08 DIAGNOSIS — C9000 Multiple myeloma not having achieved remission: Secondary | ICD-10-CM

## 2018-04-08 MED ORDER — ACYCLOVIR 400 MG PO TABS: 400.0000 mg | ORAL_TABLET | Freq: Two times a day (BID) | ORAL | 1 refills | Status: DC

## 2018-04-09 ENCOUNTER — Encounter: Payer: Self-pay | Admitting: Oncology

## 2018-04-10 ENCOUNTER — Telehealth: Payer: Self-pay

## 2018-04-10 DIAGNOSIS — C9000 Multiple myeloma not having achieved remission: Secondary | ICD-10-CM

## 2018-04-10 NOTE — Telephone Encounter (Signed)
Spoke with Steve Carter with [facility]. Pt has completed stem cell and needs follow ups. Per Steve Carter pt needs to be seen 5/13 and week of 6/17 for Day 60 followup. OK per MD. Scheduling message sent

## 2018-04-11 ENCOUNTER — Telehealth: Payer: Self-pay | Admitting: Oncology

## 2018-04-11 NOTE — Telephone Encounter (Signed)
Scheduled appt per 5/09 sch message - pt is aware of apt date and time.

## 2018-04-14 ENCOUNTER — Inpatient Hospital Stay: Payer: BLUE CROSS/BLUE SHIELD | Attending: Nurse Practitioner

## 2018-04-14 ENCOUNTER — Other Ambulatory Visit: Payer: Self-pay | Admitting: Emergency Medicine

## 2018-04-14 ENCOUNTER — Encounter: Payer: Self-pay | Admitting: Nurse Practitioner

## 2018-04-14 DIAGNOSIS — C9000 Multiple myeloma not having achieved remission: Secondary | ICD-10-CM | POA: Diagnosis not present

## 2018-04-14 LAB — CBC WITH DIFFERENTIAL (CANCER CENTER ONLY)
BASOS PCT: 1 %
Basophils Absolute: 0.1 10*3/uL (ref 0.0–0.1)
EOS PCT: 0 %
Eosinophils Absolute: 0 10*3/uL (ref 0.0–0.5)
HEMATOCRIT: 42.3 % (ref 38.4–49.9)
Hemoglobin: 14.4 g/dL (ref 13.0–17.1)
Lymphocytes Relative: 18 %
Lymphs Abs: 1.7 10*3/uL (ref 0.9–3.3)
MCH: 30.5 pg (ref 27.2–33.4)
MCHC: 34 g/dL (ref 32.0–36.0)
MCV: 89.7 fL (ref 79.3–98.0)
MONO ABS: 0.9 10*3/uL (ref 0.1–0.9)
MONOS PCT: 9 %
NEUTROS ABS: 6.9 10*3/uL — AB (ref 1.5–6.5)
Neutrophils Relative %: 72 %
PLATELETS: 99 10*3/uL — AB (ref 140–400)
RBC: 4.71 MIL/uL (ref 4.20–5.82)
RDW: 15.1 % — AB (ref 11.0–14.6)
WBC Count: 9.6 10*3/uL (ref 4.0–10.3)

## 2018-04-14 LAB — CMP (CANCER CENTER ONLY)
ALT: 115 U/L — ABNORMAL HIGH (ref 0–55)
ANION GAP: 9 (ref 3–11)
AST: 28 U/L (ref 5–34)
Albumin: 4 g/dL (ref 3.5–5.0)
Alkaline Phosphatase: 91 U/L (ref 40–150)
BILIRUBIN TOTAL: 0.4 mg/dL (ref 0.2–1.2)
BUN: 15 mg/dL (ref 7–26)
CHLORIDE: 99 mmol/L (ref 98–109)
CO2: 26 mmol/L (ref 22–29)
Calcium: 9.4 mg/dL (ref 8.4–10.4)
Creatinine: 0.93 mg/dL (ref 0.70–1.30)
GFR, Est AFR Am: >60 mL/min (ref >60)
GFR, Estimated: >60 mL/min (ref >60)
Glucose, Bld: 335 mg/dL — ABNORMAL HIGH (ref 70–140)
POTASSIUM: 4.3 mmol/L (ref 3.5–5.1)
Sodium: 134 mmol/L — ABNORMAL LOW (ref 136–145)
Total Protein: 7.1 g/dL (ref 6.4–8.3)

## 2018-04-14 LAB — MAGNESIUM: Magnesium: 1.7 mg/dL (ref 1.7–2.4)

## 2018-04-14 MED ORDER — LOSARTAN POTASSIUM 100 MG PO TABS: 100.0000 mg | ORAL_TABLET | Freq: Every day | ORAL | 1 refills | Status: DC

## 2018-04-14 NOTE — Telephone Encounter (Signed)
Labs reviewed by Dr. Benay Spice. Spoke with pt in lobby, he reports feeling weak and fatigued for the first time today. He is ambulatory. Denies fever. Informed him labs look OK. Encouraged him to push fluids, call office if symptoms worsen. He and voiced understanding.

## 2018-04-15 ENCOUNTER — Encounter: Payer: Self-pay | Admitting: Nurse Practitioner

## 2018-04-25 ENCOUNTER — Ambulatory Visit: Payer: BLUE CROSS/BLUE SHIELD | Admitting: Oncology

## 2018-04-29 ENCOUNTER — Ambulatory Visit: Payer: BLUE CROSS/BLUE SHIELD | Admitting: Oncology

## 2018-04-29 ENCOUNTER — Other Ambulatory Visit: Payer: BLUE CROSS/BLUE SHIELD

## 2018-04-29 ENCOUNTER — Telehealth: Payer: Self-pay | Admitting: *Deleted

## 2018-04-29 NOTE — Telephone Encounter (Signed)
Telephone call to patient in regards to cancelled appt on 5/28. Patients wife explaned patient was seen at [facility] and per their direction he will not need to be seen again until June 17. Confirmed this appt. Updated provider.

## 2018-05-13 ENCOUNTER — Encounter: Payer: Self-pay | Admitting: Nurse Practitioner

## 2018-05-19 ENCOUNTER — Inpatient Hospital Stay: Payer: BLUE CROSS/BLUE SHIELD | Attending: Nurse Practitioner | Admitting: Oncology

## 2018-05-19 VITALS — BP 145/100 | HR 78 | Temp 98.1°F | Resp 18 | Ht 74.0 in | Wt 251.1 lb

## 2018-05-19 DIAGNOSIS — C9000 Multiple myeloma not having achieved remission: Secondary | ICD-10-CM | POA: Diagnosis not present

## 2018-05-19 DIAGNOSIS — Z9484 Stem cells transplant status: Secondary | ICD-10-CM | POA: Diagnosis not present

## 2018-05-19 MED ORDER — SULFAMETHOXAZOLE-TRIMETHOPRIM 800-160 MG PO TABS: ORAL_TABLET | ORAL | 3 refills | Status: DC

## 2018-05-19 MED ORDER — ACYCLOVIR 800 MG PO TABS: 800.0000 mg | ORAL_TABLET | Freq: Two times a day (BID) | ORAL | 3 refills | Status: DC

## 2018-05-19 NOTE — Addendum Note (Signed)
Addended by: Mathis Fare on: 05/19/2018 05:21 PM   Modules accepted: Orders

## 2018-05-19 NOTE — Progress Notes (Signed)
[place] OFFICE PROGRESS NOTE   Diagnosis: Multiple myeloma  INTERVAL HISTORY:   Steve Carter returns for a scheduled visit.  He underwent high-dose melphalan on 03/24/2018 followed by autologous stem cell infusion on 03/25/2018.  He continues to recover from the transplant procedure.  He reports a good appetite.  No pain.  He continues acyclovir and Bactrim prophylaxis. He was seen at [facility] on 04/23/2018.  Objective:  Vital signs in last 24 hours:  Blood pressure (!) 156/89, pulse 72, temperature 97.6 F (36.4 C), temperature source Oral, resp. rate 20, height _0  (1.88 m), weight 251 lb 1.6 oz (113.9 kg), SpO2 99 %.   HEENT: No thrush Resp: Lungs clear bilaterally Cardio: Regular rate and rhythm GI: No hepatosplenomegaly, nontender Vascular: No leg edema   Lab Results:  Lab Results  Component Value Date   WBC 9.6 04/14/2018   HGB 14.4 04/14/2018   HCT 42.3 04/14/2018   MCV 89.7 04/14/2018   PLT 99 (L) 04/14/2018   NEUTROABS 6.9 (H) 04/14/2018    CMP  Lab Results  Component Value Date   NA 134 (L) 04/14/2018   K 4.3 04/14/2018   CL 99 04/14/2018   CO2 26 04/14/2018   GLUCOSE 335 (H) 04/14/2018   BUN 15 04/14/2018   CREATININE 0.93 04/14/2018   CALCIUM 9.4 04/14/2018   PROT 7.1 04/14/2018   ALBUMIN 4.0 04/14/2018   AST 28 04/14/2018   ALT 115 (H) 04/14/2018   ALKPHOS 91 04/14/2018   BILITOT 0.4 04/14/2018   GFRNONAA >60 04/14/2018   GFRAA >60 04/14/2018    Medications: I have reviewed the patient's current medications.   Assessment/Plan: 1.Multiple myeloma-IgG lambda serum monoclonal protein, elevated serum lambda light chains  Bone survey 10/11/2017-lytic lesions noted in the skull, right iliac, left second rib, and mottled appearance of the proximal femurs/pelvis  Bone marrow biopsy 10/14/2017-hypercellular marrow with plasma cell neoplasm, 82% plasma cells, lambda light chain restricted, hyperdiploid with gains of  chromosomes 3, 5, 7, 9, and 11. FISH panel positive for gain of ATM(+11)  Cycle 1 RVD 10/18/2017 (Revlimid started 10/23/2017)  Cycle 2 RVD 11/15/2017  Cycle 3 RVD 12/13/2017  Cycle 4 RVD 01/14/2018  Cycle 5 of RVD 02/14/2018 (Revlimid given for 7 days and 1 week of Velcade), therapy held beginning 02/21/2018 secondary to plan for stem cell therapy  Bone marrow biopsy 02/20/2018, 1-2% plasma cell  PET scan 02/20/2018, no malignant range activity above background, numerous lytic lesions throughout the axial and appendicular skeleton,, left iliac wing fracture  Melphalan, 200 mg/m on 03/24/2018  Stem cell infusion 03/25/2018  2.Pain secondary to multiple myeloma involving the spine and pelvis-resolved  MRI of the lumbar spine 10/02/2018-numerous rounded foci in the vertebral bodies, hypertrophy of the L4 transverse process  3.Hypertension-losartan dose increased 11/15/2017  4.Depression    Disposition: Steve Carter is now at day 63 following high-dose chemotherapy and autologous stem cell support.  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  He appears well.  I discussed the case with the transplant service at [facility].  Steve Carter will be scheduled for Zometa during the week of 05/26/2018.  He will be scheduled for a day 100 restaging evaluation at [facility] in August.  He will return for an office visit here after the restaging evaluation.  The plan is to begin maintenance therapy after the day 100 visit.  25 minutes were spent with the patient today.  The majority of  the time was used for counseling and coordination of care.  Betsy Coder, MD  05/19/2018  1:39 PM

## 2018-05-19 NOTE — Addendum Note (Signed)
Addended by: Mathis Fare on: 05/19/2018 04:23 PM   Modules accepted: Orders

## 2018-05-20 ENCOUNTER — Telehealth: Payer: Self-pay | Admitting: Oncology

## 2018-05-20 NOTE — Telephone Encounter (Signed)
Spoke with patient's wife regarding upcoming sch appts. Patient needed early morning appts.

## 2018-05-28 ENCOUNTER — Encounter: Payer: Self-pay | Admitting: Nurse Practitioner

## 2018-05-30 ENCOUNTER — Ambulatory Visit: Payer: BLUE CROSS/BLUE SHIELD

## 2018-05-30 ENCOUNTER — Inpatient Hospital Stay: Payer: BLUE CROSS/BLUE SHIELD

## 2018-05-30 DIAGNOSIS — C9000 Multiple myeloma not having achieved remission: Secondary | ICD-10-CM | POA: Diagnosis not present

## 2018-05-30 MED ORDER — SODIUM CHLORIDE 0.9 % IV SOLN
Freq: Once | INTRAVENOUS | Status: AC
  Administered 2018-05-30: 09:00:00 via INTRAVENOUS

## 2018-05-30 MED ORDER — ZOLEDRONIC ACID 4 MG/100ML IV SOLN
4.0000 mg | Freq: Once | INTRAVENOUS | Status: AC
  Administered 2018-05-30: 4 mg via INTRAVENOUS
  Filled 2018-05-30: qty 100

## 2018-05-30 NOTE — Patient Instructions (Signed)

## 2018-05-30 NOTE — Progress Notes (Signed)
To treat with CMP from 05/22 from [place].

## 2018-06-25 ENCOUNTER — Other Ambulatory Visit: Payer: Self-pay | Admitting: Emergency Medicine

## 2018-06-25 MED ORDER — LOSARTAN POTASSIUM 100 MG PO TABS: 100.0000 mg | ORAL_TABLET | Freq: Every day | ORAL | 1 refills | Status: DC

## 2018-07-21 ENCOUNTER — Ambulatory Visit: Payer: BLUE CROSS/BLUE SHIELD | Admitting: Oncology

## 2018-07-21 ENCOUNTER — Telehealth: Payer: Self-pay | Admitting: Oncology

## 2018-07-21 ENCOUNTER — Inpatient Hospital Stay: Payer: BLUE CROSS/BLUE SHIELD | Attending: Nurse Practitioner | Admitting: Oncology

## 2018-07-21 VITALS — BP 146/97 | HR 81 | Temp 98.2°F | Resp 18 | Ht 74.0 in | Wt 258.4 lb

## 2018-07-21 DIAGNOSIS — F329 Major depressive disorder, single episode, unspecified: Secondary | ICD-10-CM | POA: Diagnosis not present

## 2018-07-21 DIAGNOSIS — C9000 Multiple myeloma not having achieved remission: Secondary | ICD-10-CM | POA: Insufficient documentation

## 2018-07-21 DIAGNOSIS — I10 Essential (primary) hypertension: Secondary | ICD-10-CM | POA: Insufficient documentation

## 2018-07-21 MED ORDER — LENALIDOMIDE 10 MG PO CAPS: ORAL_CAPSULE | ORAL | 0 refills | Status: DC

## 2018-07-21 NOTE — Progress Notes (Signed)
[facility] OFFICE PROGRESS NOTE   Diagnosis: Multiple myeloma  INTERVAL HISTORY:   Steve Carter returns as scheduled.  Steve Carter underwent a restaging evaluation at [facility] on 07/09/2018.  A bone marrow biopsy showed no involvement with myeloma.  Plasma cells were estimated at less than 1% of the overall cellular population and appeared polyclonal.  The kappa and lambda light chains returned normal.  Steve Carter feels well.  No pain.  Steve Carter reports the [facility] team recommended beginning maintenance Revlimid.    Objective:  Vital signs in last 24 hours:  Blood pressure (!) 146/97, pulse 81, temperature 98.2 F (36.8 C), temperature source Oral, resp. rate 18, height 6' 2" (1.88 m), weight 258 lb 6.4 oz (117.2 kg), SpO2 98 %.    HEENT: No thrush or ulcers Resp: Lungs clear bilaterally Cardio: Regular rate and rhythm GI: No hepatosplenomegaly Vascular: No leg edema     Lab Results:  Lab Results  Component Value Date   WBC 9.6 04/14/2018   HGB 14.4 04/14/2018   HCT 42.3 04/14/2018   MCV 89.7 04/14/2018   PLT 99 (L) 04/14/2018   NEUTROABS 6.9 (H) 04/14/2018    CMP  Lab Results  Component Value Date   NA 134 (L) 04/14/2018   K 4.3 04/14/2018   CL 99 04/14/2018   CO2 26 04/14/2018   GLUCOSE 335 (H) 04/14/2018   BUN 15 04/14/2018   CREATININE 0.93 04/14/2018   CALCIUM 9.4 04/14/2018   PROT 7.1 04/14/2018   ALBUMIN 4.0 04/14/2018   AST 28 04/14/2018   ALT 115 (H) 04/14/2018   ALKPHOS 91 04/14/2018   BILITOT 0.4 04/14/2018   GFRNONAA >60 04/14/2018   GFRAA >60 04/14/2018     Medications: I have reviewed the patient's current medications.   Assessment/Plan: 1.Multiple myeloma-IgG lambda serum monoclonal protein, elevated serum lambda light chains  Bone survey 10/11/2017-lytic lesions noted in the skull, right iliac, left second rib, and mottled appearance of the proximal femurs/pelvis  Bone marrow biopsy 10/14/2017-hypercellular marrow with plasma  cell neoplasm, 82% plasma cells, lambda light chain restricted, hyperdiploid with gains of chromosomes 3, 5, 7, 9, and 11. FISH panel positive for gain of ATM(+11)  Cycle 1 RVD 10/18/2017 (Revlimid started 10/23/2017)  Cycle 2 RVD 11/15/2017  Cycle 3 RVD 12/13/2017  Cycle 4 RVD 01/14/2018  Cycle 5 of RVD 02/14/2018 (Revlimid given for 7 days and 1 week of Velcade), therapy held beginning 02/21/2018 secondary to plan for stem cell therapy  Bone marrow biopsy 02/20/2018, 1-2% plasma cell  PET scan 02/20/2018, no malignant range activity above background, numerous lytic lesions throughout the axial and appendicular skeleton,, left iliac wing fracture  Melphalan, 200 mg/m on 03/24/2018  Stem cell infusion 03/25/2018  Restaging at [facility] 07/09/2018: Normal lambda light chains, no serum M spike, bone marrow biopsy negative for myeloma, less than 1% plasma cells  2.Pain secondary to multiple myeloma involving the spine and pelvis-resolved  MRI of the lumbar spine 10/02/2018-numerous rounded foci in the vertebral bodies, hypertrophy of the L4 transverse process  3.Hypertension-losartan dose increased 11/15/2017  4.Depression    Disposition: Steve Carter is in remission from myeloma after undergoing RVD induction followed by autologous stem cell therapy.  The plan is to begin maintenance Revlimid now.  We reviewed potential toxicities associated with Revlimid.  Steve Carter will begin Revlimid at a dose of 10 mg daily.  Steve Carter will return for an office visit visit, labs, and Zometa on 08/14/2018.  25 minutes were   spent with the patient today.  The majority of the time was used for counseling and coordination of care.   , MD  07/21/2018  10:26 AM   

## 2018-07-21 NOTE — Telephone Encounter (Signed)
Spoke with spouse re 9/12 appointments.

## 2018-07-21 NOTE — Addendum Note (Signed)
Addended by: Mathis Fare on: 07/21/2018 12:50 PM   Modules accepted: Orders

## 2018-07-22 ENCOUNTER — Other Ambulatory Visit: Payer: Self-pay

## 2018-07-22 NOTE — Progress Notes (Signed)
Request for Losartan refill. Spoke with pharmacy and medication has a refill available. Called to make patient aware.

## 2018-07-31 ENCOUNTER — Telehealth: Payer: Self-pay | Admitting: *Deleted

## 2018-07-31 ENCOUNTER — Encounter: Payer: Self-pay | Admitting: Nurse Practitioner

## 2018-07-31 NOTE — Telephone Encounter (Signed)
Telephone call to patient in regards to MyChart message. Revilmid has been sent to the pharmacy and authorization is up to date.

## 2018-08-14 ENCOUNTER — Inpatient Hospital Stay: Payer: BLUE CROSS/BLUE SHIELD

## 2018-08-14 ENCOUNTER — Inpatient Hospital Stay: Payer: BLUE CROSS/BLUE SHIELD | Attending: Nurse Practitioner | Admitting: Oncology

## 2018-08-14 ENCOUNTER — Telehealth: Payer: Self-pay | Admitting: Oncology

## 2018-08-14 VITALS — BP 137/85 | HR 76 | Temp 98.1°F | Resp 18 | Ht 74.0 in | Wt 261.5 lb

## 2018-08-14 DIAGNOSIS — C9000 Multiple myeloma not having achieved remission: Secondary | ICD-10-CM | POA: Diagnosis not present

## 2018-08-14 DIAGNOSIS — Z7982 Long term (current) use of aspirin: Secondary | ICD-10-CM | POA: Diagnosis not present

## 2018-08-14 DIAGNOSIS — F329 Major depressive disorder, single episode, unspecified: Secondary | ICD-10-CM | POA: Diagnosis not present

## 2018-08-14 DIAGNOSIS — Z23 Encounter for immunization: Secondary | ICD-10-CM

## 2018-08-14 DIAGNOSIS — C9001 Multiple myeloma in remission: Secondary | ICD-10-CM

## 2018-08-14 LAB — CMP (CANCER CENTER ONLY)
ALT: 63 U/L — AB (ref 0–44)
AST: 36 U/L (ref 15–41)
Albumin: 4.2 g/dL (ref 3.5–5.0)
Alkaline Phosphatase: 57 U/L (ref 38–126)
Anion gap: 11 (ref 5–15)
BUN: 8 mg/dL (ref 6–20)
CHLORIDE: 101 mmol/L (ref 98–111)
CO2: 27 mmol/L (ref 22–32)
CREATININE: 1.15 mg/dL (ref 0.61–1.24)
Calcium: 9.6 mg/dL (ref 8.9–10.3)
GFR, Estimated: >60 mL/min (ref >60)
Glucose, Bld: 136 mg/dL — ABNORMAL HIGH (ref 70–99)
Potassium: 4.3 mmol/L (ref 3.5–5.1)
Sodium: 139 mmol/L (ref 135–145)
Total Bilirubin: 0.4 mg/dL (ref 0.3–1.2)
Total Protein: 7.4 g/dL (ref 6.5–8.1)

## 2018-08-14 LAB — CBC WITH DIFFERENTIAL (CANCER CENTER ONLY)
Basophils Absolute: 0 10*3/uL (ref 0.0–0.1)
Basophils Relative: 0 %
EOS ABS: 0 10*3/uL (ref 0.0–0.5)
Eosinophils Relative: 0 %
HEMATOCRIT: 46.4 % (ref 38.4–49.9)
HEMOGLOBIN: 15.8 g/dL (ref 13.0–17.1)
LYMPHS ABS: 1.2 10*3/uL (ref 0.9–3.3)
Lymphocytes Relative: 16 %
MCH: 32.4 pg (ref 27.2–33.4)
MCHC: 34.1 g/dL (ref 32.0–36.0)
MCV: 95.1 fL (ref 79.3–98.0)
MONO ABS: 0.6 10*3/uL (ref 0.1–0.9)
MONOS PCT: 9 %
NEUTROS PCT: 75 %
Neutro Abs: 5.5 10*3/uL (ref 1.5–6.5)
Platelet Count: 206 10*3/uL (ref 140–400)
RBC: 4.88 MIL/uL (ref 4.20–5.82)
RDW: 13.2 % (ref 11.0–14.6)
WBC Count: 7.3 10*3/uL (ref 4.0–10.3)

## 2018-08-14 MED ORDER — SODIUM CHLORIDE 0.9 % IV SOLN
Freq: Once | INTRAVENOUS | Status: AC
  Administered 2018-08-14: 13:00:00 via INTRAVENOUS
  Filled 2018-08-14: qty 250

## 2018-08-14 MED ORDER — ZOLEDRONIC ACID 4 MG/100ML IV SOLN
4.0000 mg | Freq: Once | INTRAVENOUS | Status: AC
  Administered 2018-08-14: 4 mg via INTRAVENOUS
  Filled 2018-08-14: qty 100

## 2018-08-14 MED ORDER — INFLUENZA VAC SPLIT QUAD 0.5 ML IM SUSY
0.5000 mL | PREFILLED_SYRINGE | Freq: Once | INTRAMUSCULAR | Status: AC
  Administered 2018-08-14: 0.5 mL via INTRAMUSCULAR

## 2018-08-14 MED ORDER — INFLUENZA VAC SPLIT QUAD 0.5 ML IM SUSY
PREFILLED_SYRINGE | INTRAMUSCULAR | Status: AC
  Filled 2018-08-14: qty 0.5

## 2018-08-14 NOTE — Telephone Encounter (Signed)
Scheduled appt per 9/12 los - Gave patient AVS and calender per los.  

## 2018-08-14 NOTE — Progress Notes (Signed)
[place] OFFICE PROGRESS NOTE   Diagnosis: Multiple myeloma  INTERVAL HISTORY:   Steve Carter returns as scheduled.  He feels well.  No pain.  No complaint.  A PET scan at [facility] 07/09/2018 revealed lytic bone lesions with no malignant range activity.  The serum free lambda light chains returned in the normal range.  A bone marrow biopsy revealed no evidence of myeloma.   Objective:  Vital signs in last 24 hours:  Blood pressure 137/85, pulse 76, temperature 98.1 F (36.7 C), temperature source Oral, resp. rate 18, height '6\' 2"'$  (1.88 m), weight 261 lb 8 oz (118.6 kg), SpO2 98 %.    HEENT: No thrush Resp: Lungs clear bilaterally Cardio: Regular rate and rhythm GI: No hepatosplenomegaly Vascular: No leg edema   Lab Results:  Lab Results  Component Value Date   WBC 7.3 08/14/2018   HGB 15.8 08/14/2018   HCT 46.4 08/14/2018   MCV 95.1 08/14/2018   PLT 206 08/14/2018   NEUTROABS 5.5 08/14/2018    CMP  Lab Results  Component Value Date   NA 139 08/14/2018   K 4.3 08/14/2018   CL 101 08/14/2018   CO2 27 08/14/2018   GLUCOSE 136 (H) 08/14/2018   BUN 8 08/14/2018   CREATININE 1.15 08/14/2018   CALCIUM 9.6 08/14/2018   PROT 7.4 08/14/2018   ALBUMIN 4.2 08/14/2018   AST 36 08/14/2018   ALT 63 (H) 08/14/2018   ALKPHOS 57 08/14/2018   BILITOT 0.4 08/14/2018   GFRNONAA >60 08/14/2018   GFRAA >60 08/14/2018     Medications: I have reviewed the patient's current medications.   Assessment/Plan: 1.Multiple myeloma-IgG lambda serum monoclonal protein, elevated serum lambda light chains  Bone survey 10/11/2017-lytic lesions noted in the skull, right iliac, left second rib, and mottled appearance of the proximal femurs/pelvis  Bone marrow biopsy 10/14/2017-hypercellular marrow with plasma cell neoplasm, 82% plasma cells, lambda light chain restricted, hyperdiploid with gains of chromosomes 3, 5, 7, 9, and 11. FISH panel positive for gain of  ATM(+11)  Cycle 1 RVD 10/18/2017 (Revlimid started 10/23/2017)  Cycle 2 RVD 11/15/2017  Cycle 3 RVD 12/13/2017  Cycle 4 RVD 01/14/2018  Cycle 5 of RVD 02/14/2018 (Revlimid given for 7 days and 1 week of Velcade), therapy held beginning 02/21/2018 secondary to plan for stem cell therapy  Bone marrow biopsy 02/20/2018, 1-2% plasma cell  PET scan 02/20/2018, no malignant range activity above background, numerous lytic lesions throughout the axial and appendicular skeleton,, left iliac wing fracture  Melphalan, 200 mg/m on 03/24/2018  Stem cell infusion 03/25/2018  Restaging at [facility] 07/09/2018: Normal lambda light chains, no serum M spike, bone marrow biopsy negative for myeloma, less than 1% plasma cells  PET scan at [facility] 07/09/2018- lytic bone lesions, no malignant range activity  Initiation of maintenance Revlimid, 10 mg, 21/28 days 08/15/2018  2.Pain secondary to multiple myeloma involving the spine and pelvis-resolved  MRI of the lumbar spine 10/02/2018-numerous rounded foci in the vertebral bodies, hypertrophy of the L4 transverse process  3.Hypertension-losartan dose increased 11/15/2017  4.Depression   Disposition: [name] is in clinical remission for multiple myeloma.  He will begin maintenance Revlimid tomorrow.  He will take Revlimid for 21 out of 28 days.  He is taking daily aspirin.  He continues acyclovir and Bactrim prophylaxis.  He will receive Zometa today.  He will discontinue the potassium supplement.  [name] will return for an office and lab visit on 09/11/2018.  He will receive an influenza vaccine today.  Betsy Coder, MD  08/14/2018  12:24 PM

## 2018-08-14 NOTE — Patient Instructions (Signed)
Zoledronic Acid injection (Hypercalcemia, Oncology) What is this medicine? ZOLEDRONIC ACID (ZOE le dron ik AS id) lowers the amount of calcium loss from bone. It is used to treat too much calcium in your blood from cancer. It is also used to prevent complications of cancer that has spread to the bone. This medicine may be used for other purposes; ask your health care provider or pharmacist if you have questions. COMMON BRAND NAME(S): Zometa What should I tell my health care provider before I take this medicine? They need to know if you have any of these conditions: -aspirin-sensitive asthma -cancer, especially if you are receiving medicines used to treat cancer -dental disease or wear dentures -infection -kidney disease -receiving corticosteroids like dexamethasone or prednisone -an unusual or allergic reaction to zoledronic acid, other medicines, foods, dyes, or preservatives -pregnant or trying to get pregnant -breast-feeding How should I use this medicine? This medicine is for infusion into a vein. It is given by a health care professional in a hospital or clinic setting. Talk to your pediatrician regarding the use of this medicine in children. Special care may be needed. Overdosage: If you think you have taken too much of this medicine contact a poison control center or emergency room at once. NOTE: This medicine is only for you. Do not share this medicine with others. What if I miss a dose? It is important not to miss your dose. Call your doctor or health care professional if you are unable to keep an appointment. What may interact with this medicine? -certain antibiotics given by injection -NSAIDs, medicines for pain and inflammation, like ibuprofen or naproxen -some diuretics like bumetanide, furosemide -teriparatide -thalidomide This list may not describe all possible interactions. Give your health care provider a list of all the medicines, herbs, non-prescription drugs, or  dietary supplements you use. Also tell them if you smoke, drink alcohol, or use illegal drugs. Some items may interact with your medicine. What should I watch for while using this medicine? Visit your doctor or health care professional for regular checkups. It may be some time before you see the benefit from this medicine. Do not stop taking your medicine unless your doctor tells you to. Your doctor may order blood tests or other tests to see how you are doing. Women should inform their doctor if they wish to become pregnant or think they might be pregnant. There is a potential for serious side effects to an unborn child. Talk to your health care professional or pharmacist for more information. You should make sure that you get enough calcium and vitamin D while you are taking this medicine. Discuss the foods you eat and the vitamins you take with your health care professional. Some people who take this medicine have severe bone, joint, and/or muscle pain. This medicine may also increase your risk for jaw problems or a broken thigh bone. Tell your doctor right away if you have severe pain in your jaw, bones, joints, or muscles. Tell your doctor if you have any pain that does not go away or that gets worse. Tell your dentist and dental surgeon that you are taking this medicine. You should not have major dental surgery while on this medicine. See your dentist to have a dental exam and fix any dental problems before starting this medicine. Take good care of your teeth while on this medicine. Make sure you see your dentist for regular follow-up appointments. What side effects may I notice from receiving this medicine? Side effects that   you should report to your doctor or health care professional as soon as possible: -allergic reactions like skin rash, itching or hives, swelling of the face, lips, or tongue -anxiety, confusion, or depression -breathing problems -changes in vision -eye pain -feeling faint or  lightheaded, falls -jaw pain, especially after dental work -mouth sores -muscle cramps, stiffness, or weakness -redness, blistering, peeling or loosening of the skin, including inside the mouth -trouble passing urine or change in the amount of urine Side effects that usually do not require medical attention (report to your doctor or health care professional if they continue or are bothersome): -bone, joint, or muscle pain -constipation -diarrhea -fever -hair loss -irritation at site where injected -loss of appetite -nausea, vomiting -stomach upset -trouble sleeping -trouble swallowing -weak or tired This list may not describe all possible side effects. Call your doctor for medical advice about side effects. You may report side effects to FDA at 1-800-FDA-1088. Where should I keep my medicine? This drug is given in a hospital or clinic and will not be stored at home. NOTE: This sheet is a summary. It may not cover all possible information. If you have questions about this medicine, talk to your doctor, pharmacist, or health care provider.  2018 Elsevier/Gold Standard (2014-04-17 14:19:39)  Influenza (Flu) Vaccine (Inactivated or Recombinant): What You Need to Know 1. Why get vaccinated? Influenza ("flu") is a contagious disease that spreads around the [place] every year, usually between October and May. Flu is caused by influenza viruses, and is spread mainly by coughing, sneezing, and close contact. Anyone can get flu. Flu strikes suddenly and can last several days. Symptoms vary by age, but can include:  fever/chills  sore throat  muscle aches  fatigue  cough  headache  runny or stuffy nose  Flu can also lead to pneumonia and blood infections, and cause diarrhea and seizures in children. If you have a medical condition, such as heart or lung disease, flu can make it worse. Flu is more dangerous for some people. Infants and young children, people 65 years of age  and older, pregnant women, and people with certain health conditions or a weakened immune system are at greatest risk. Each year thousands of people in the [place] die from flu, and many more are hospitalized. Flu vaccine can:  keep you from getting flu,  make flu less severe if you do get it, and  keep you from spreading flu to your family and other people. 2. Inactivated and recombinant flu vaccines A dose of flu vaccine is recommended every flu season. Children 6 months through 8 years of age may need two doses during the same flu season. Everyone else needs only one dose each flu season. Some inactivated flu vaccines contain a very small amount of a mercury-based preservative called thimerosal. Studies have not shown thimerosal in vaccines to be harmful, but flu vaccines that do not contain thimerosal are available. There is no live flu virus in flu shots. They cannot cause the flu. There are many flu viruses, and they are always changing. Each year a new flu vaccine is made to protect against three or four viruses that are likely to cause disease in the upcoming flu season. But even when the vaccine doesn't exactly match these viruses, it may still provide some protection. Flu vaccine cannot prevent:  flu that is caused by a virus not covered by the vaccine, or  illnesses that look like flu but are not.  It takes about 2   weeks for protection to develop after vaccination, and protection lasts through the flu season. 3. Some people should not get this vaccine Tell the person who is giving you the vaccine:  If you have any severe, life-threatening allergies. If you ever had a life-threatening allergic reaction after a dose of flu vaccine, or have a severe allergy to any part of this vaccine, you may be advised not to get vaccinated. Most, but not all, types of flu vaccine contain a small amount of egg protein.  If you ever had Guillain-Barr Syndrome (also called GBS). Some people  with a history of GBS should not get this vaccine. This should be discussed with your doctor.  If you are not feeling well. It is usually okay to get flu vaccine when you have a mild illness, but you might be asked to come back when you feel better.  4. Risks of a vaccine reaction With any medicine, including vaccines, there is a chance of reactions. These are usually mild and go away on their own, but serious reactions are also possible. Most people who get a flu shot do not have any problems with it. Minor problems following a flu shot include:  soreness, redness, or swelling where the shot was given  hoarseness  sore, red or itchy eyes  cough  fever  aches  headache  itching  fatigue  If these problems occur, they usually begin soon after the shot and last 1 or 2 days. More serious problems following a flu shot can include the following:  There may be a small increased risk of Guillain-Barre Syndrome (GBS) after inactivated flu vaccine. This risk has been estimated at 1 or 2 additional cases per million people vaccinated. This is much lower than the risk of severe complications from flu, which can be prevented by flu vaccine.  Young children who get the flu shot along with pneumococcal vaccine (PCV13) and/or DTaP vaccine at the same time might be slightly more likely to have a seizure caused by fever. Ask your doctor for more information. Tell your doctor if a child who is getting flu vaccine has ever had a seizure.  Problems that could happen after any injected vaccine:  People sometimes faint after a medical procedure, including vaccination. Sitting or lying down for about 15 minutes can help prevent fainting, and injuries caused by a fall. Tell your doctor if you feel dizzy, or have vision changes or ringing in the ears.  Some people get severe pain in the shoulder and have difficulty moving the arm where a shot was given. This happens very rarely.  Any medication can  cause a severe allergic reaction. Such reactions from a vaccine are very rare, estimated at about 1 in a million doses, and would happen within a few minutes to a few hours after the vaccination. As with any medicine, there is a very remote chance of a vaccine causing a serious injury or death. The safety of vaccines is always being monitored. For more information, visit: www.cdc.gov/vaccinesafety/ 5. What if there is a serious reaction? What should I look for? Look for anything that concerns you, such as signs of a severe allergic reaction, very high fever, or unusual behavior. Signs of a severe allergic reaction can include hives, swelling of the face and throat, difficulty breathing, a fast heartbeat, dizziness, and weakness. These would start a few minutes to a few hours after the vaccination. What should I do?  If you think it is a severe allergic reaction   or other emergency that can't wait, call 9-1-1 and get the person to the nearest hospital. Otherwise, call your doctor.  Reactions should be reported to the Vaccine Adverse Event Reporting System (VAERS). Your doctor should file this report, or you can do it yourself through the VAERS web site at www.vaers.hhs.gov, or by calling 1-800-822-7967. ? VAERS does not give medical advice. 6. The National Vaccine Injury Compensation Program The National Vaccine Injury Compensation Program (VICP) is a federal program that was created to compensate people who may have been injured by certain vaccines. Persons who believe they may have been injured by a vaccine can learn about the program and about filing a claim by calling 1-800-338-2382 or visiting the VICP website at www.hrsa.gov/vaccinecompensation. There is a time limit to file a claim for compensation. 7. How can I learn more?  Ask your healthcare provider. He or she can give you the vaccine package insert or suggest other sources of information.  Call your local or state health  department.  Contact the Centers for Disease Control and Prevention (CDC): ? Call 1-800-232-4636 (1-800-CDC-INFO) or ? Visit CDC's website at www.cdc.gov/flu Vaccine Information Statement, Inactivated Influenza Vaccine (07/09/2014) This information is not intended to replace advice given to you by your health care provider. Make sure you discuss any questions you have with your health care provider. Document Released: 09/13/2006 Document Revised: 08/09/2016 Document Reviewed: 08/09/2016 Elsevier Interactive Patient Education  2017 Elsevier Inc.  

## 2018-08-21 ENCOUNTER — Other Ambulatory Visit: Payer: Self-pay | Admitting: Emergency Medicine

## 2018-08-21 MED ORDER — LOSARTAN POTASSIUM 100 MG PO TABS: 100.0000 mg | ORAL_TABLET | Freq: Every day | ORAL | 1 refills | Status: DC

## 2018-09-04 ENCOUNTER — Encounter: Payer: Self-pay | Admitting: Nurse Practitioner

## 2018-09-04 ENCOUNTER — Other Ambulatory Visit: Payer: Self-pay | Admitting: *Deleted

## 2018-09-04 DIAGNOSIS — C9000 Multiple myeloma not having achieved remission: Secondary | ICD-10-CM

## 2018-09-04 MED ORDER — LENALIDOMIDE 10 MG PO CAPS: ORAL_CAPSULE | ORAL | 0 refills | Status: DC

## 2018-09-05 ENCOUNTER — Other Ambulatory Visit: Payer: Self-pay | Admitting: *Deleted

## 2018-09-05 DIAGNOSIS — C9000 Multiple myeloma not having achieved remission: Secondary | ICD-10-CM

## 2018-09-05 MED ORDER — ACYCLOVIR 800 MG PO TABS: 800.0000 mg | ORAL_TABLET | Freq: Two times a day (BID) | ORAL | 3 refills | Status: DC

## 2018-09-11 ENCOUNTER — Other Ambulatory Visit: Payer: BLUE CROSS/BLUE SHIELD

## 2018-09-11 ENCOUNTER — Ambulatory Visit: Payer: BLUE CROSS/BLUE SHIELD | Admitting: Nurse Practitioner

## 2018-09-16 ENCOUNTER — Other Ambulatory Visit: Payer: Self-pay | Admitting: Emergency Medicine

## 2018-09-16 ENCOUNTER — Other Ambulatory Visit: Payer: Self-pay | Admitting: Oncology

## 2018-09-16 DIAGNOSIS — C9000 Multiple myeloma not having achieved remission: Secondary | ICD-10-CM

## 2018-09-16 MED ORDER — LOSARTAN POTASSIUM 100 MG PO TABS: 100.0000 mg | ORAL_TABLET | Freq: Every day | ORAL | 1 refills | Status: DC

## 2018-09-18 ENCOUNTER — Telehealth: Payer: Self-pay

## 2018-09-18 NOTE — Telephone Encounter (Signed)
Per 10/17 vm return call. Spoke with patient wife to inform her of the new appointment that was scheduled for the follow week as requested. Will also mail a letter with a calender enclosed

## 2018-09-19 ENCOUNTER — Ambulatory Visit: Payer: BLUE CROSS/BLUE SHIELD | Admitting: Nurse Practitioner

## 2018-09-19 ENCOUNTER — Other Ambulatory Visit: Payer: BLUE CROSS/BLUE SHIELD

## 2018-09-25 ENCOUNTER — Inpatient Hospital Stay: Payer: BLUE CROSS/BLUE SHIELD | Attending: Nurse Practitioner

## 2018-09-25 ENCOUNTER — Inpatient Hospital Stay: Payer: BLUE CROSS/BLUE SHIELD | Admitting: Nurse Practitioner

## 2018-09-25 ENCOUNTER — Telehealth: Payer: Self-pay | Admitting: Nurse Practitioner

## 2018-09-25 ENCOUNTER — Encounter: Payer: Self-pay | Admitting: Nurse Practitioner

## 2018-09-25 VITALS — BP 145/91 | HR 74 | Temp 97.8°F | Resp 17 | Ht 74.0 in | Wt 264.2 lb

## 2018-09-25 DIAGNOSIS — C9001 Multiple myeloma in remission: Secondary | ICD-10-CM

## 2018-09-25 DIAGNOSIS — C9000 Multiple myeloma not having achieved remission: Secondary | ICD-10-CM | POA: Diagnosis not present

## 2018-09-25 DIAGNOSIS — G893 Neoplasm related pain (acute) (chronic): Secondary | ICD-10-CM | POA: Diagnosis not present

## 2018-09-25 DIAGNOSIS — Z9221 Personal history of antineoplastic chemotherapy: Secondary | ICD-10-CM

## 2018-09-25 DIAGNOSIS — Z79899 Other long term (current) drug therapy: Secondary | ICD-10-CM | POA: Diagnosis not present

## 2018-09-25 DIAGNOSIS — F329 Major depressive disorder, single episode, unspecified: Secondary | ICD-10-CM | POA: Insufficient documentation

## 2018-09-25 DIAGNOSIS — I10 Essential (primary) hypertension: Secondary | ICD-10-CM | POA: Diagnosis not present

## 2018-09-25 LAB — CMP (CANCER CENTER ONLY)
ALBUMIN: 4 g/dL (ref 3.5–5.0)
ALK PHOS: 56 U/L (ref 38–126)
ALT: 67 U/L — ABNORMAL HIGH (ref 0–44)
ANION GAP: 10 (ref 5–15)
AST: 38 U/L (ref 15–41)
BUN: 10 mg/dL (ref 6–20)
CO2: 29 mmol/L (ref 22–32)
Calcium: 9.3 mg/dL (ref 8.9–10.3)
Chloride: 101 mmol/L (ref 98–111)
Creatinine: 1.19 mg/dL (ref 0.61–1.24)
GFR, Est AFR Am: >60 mL/min (ref >60)
GFR, Estimated: >60 mL/min (ref >60)
GLUCOSE: 105 mg/dL — AB (ref 70–99)
POTASSIUM: 4 mmol/L (ref 3.5–5.1)
SODIUM: 140 mmol/L (ref 135–145)
Total Bilirubin: 0.5 mg/dL (ref 0.3–1.2)
Total Protein: 7.1 g/dL (ref 6.5–8.1)

## 2018-09-25 LAB — CBC WITH DIFFERENTIAL (CANCER CENTER ONLY)
ABS IMMATURE GRANULOCYTES: 0.1 10*3/uL — AB (ref 0.00–0.07)
Basophils Absolute: 0.1 10*3/uL (ref 0.0–0.1)
Basophils Relative: 2 %
Eosinophils Absolute: 0.1 10*3/uL (ref 0.0–0.5)
Eosinophils Relative: 2 %
HCT: 47.7 % (ref 39.0–52.0)
Hemoglobin: 16 g/dL (ref 13.0–17.0)
IMMATURE GRANULOCYTES: 2 %
LYMPHS ABS: 1.1 10*3/uL (ref 0.7–4.0)
Lymphocytes Relative: 19 %
MCH: 30.5 pg (ref 26.0–34.0)
MCHC: 33.5 g/dL (ref 30.0–36.0)
MCV: 91 fL (ref 80.0–100.0)
MONOS PCT: 10 %
Monocytes Absolute: 0.6 10*3/uL (ref 0.1–1.0)
NEUTROS PCT: 65 %
Neutro Abs: 3.7 10*3/uL (ref 1.7–7.7)
Platelet Count: 214 10*3/uL (ref 150–400)
RBC: 5.24 MIL/uL (ref 4.22–5.81)
RDW: 13.2 % (ref 11.5–15.5)
WBC Count: 5.6 10*3/uL (ref 4.0–10.5)
nRBC: 0 % (ref 0.0–0.2)

## 2018-09-25 MED ORDER — ZOLPIDEM TARTRATE ER 12.5 MG PO TBCR: 12.5000 mg | EXTENDED_RELEASE_TABLET | Freq: Every evening | ORAL | 0 refills | Status: DC | PRN

## 2018-09-25 MED ORDER — SULFAMETHOXAZOLE-TRIMETHOPRIM 800-160 MG PO TABS: ORAL_TABLET | ORAL | 1 refills | Status: DC

## 2018-09-25 NOTE — Telephone Encounter (Signed)
Scheduled appt per 10/24 los - gave patient aVS and calender per los.   

## 2018-09-25 NOTE — Progress Notes (Signed)
  [facility] OFFICE PROGRESS NOTE   Diagnosis: Multiple myeloma  INTERVAL HISTORY:   Steve Carter returns for follow-up.  He began maintenance Revlimid 21 of 28 days on 08/15/2018.  Denies nausea/vomiting.  He has occasional diarrhea.  He notes stable tingling in the toes mainly at bedtime.  He has a good appetite.  He denies pain.  No interim illnesses or infections.  He denies shortness of breath.  No leg swelling or calf pain.  He notes periodic memory issues since "high-dose chemotherapy".  Objective:  Vital signs in last 24 hours:  Blood pressure (!) 145/91, pulse 74, temperature 97.8 F (36.6 C), temperature source Oral, resp. rate 17, height _0  (1.88 m), weight 264 lb 3.2 oz (119.8 kg), SpO2 98 %.    HEENT: No thrush or ulcers. Resp: Lungs clear bilaterally. Cardio: Regular rate and rhythm. GI: Abdomen soft and nontender.  No hepatospleno megaly. Vascular: No leg edema. Neuro: Alert and oriented.   Lab Results:  Lab Results  Component Value Date   WBC 5.6 09/25/2018   HGB 16.0 09/25/2018   HCT 47.7 09/25/2018   MCV 91.0 09/25/2018   PLT 214 09/25/2018   NEUTROABS 3.7 09/25/2018    Imaging:  No results found.  Medications: I have reviewed the patient's current medications.  Assessment/Plan: 1.Multiple myeloma-IgG lambda serum monoclonal protein, elevated serum lambda light chains  Bone survey 10/11/2017-lytic lesions noted in the skull, right iliac, left second rib, and mottled appearance of the proximal femurs/pelvis  Bone marrow biopsy 10/14/2017-hypercellular marrow with plasma cell neoplasm, 82% plasma cells, lambda light chain restricted, hyperdiploid with gains of chromosomes 3, 5, 7, 9, and 11. FISH panel positive for gain of ATM(+11)  Cycle 1 RVD 10/18/2017 (Revlimid started 10/23/2017)  Cycle 2 RVD 11/15/2017  Cycle 3 RVD 12/13/2017  Cycle 4 RVD 01/14/2018  Cycle 5 of RVD 02/14/2018 (Revlimid given for 7 days and 1 week of  Velcade), therapy held beginning 02/21/2018 secondary to plan for stem cell therapy  Bone marrow biopsy 02/20/2018, 1-2% plasma cell  PET scan 02/20/2018, no malignant range activity above background, numerous lytic lesions throughout the axial and appendicular skeleton,, left iliac wing fracture  Melphalan, 200 mg/m on 03/24/2018  Stem cell infusion 03/25/2018  Restaging at [facility] 07/09/2018: Normal lambda light chains, no serum M spike, bone marrow biopsy negative for myeloma, less than 1% plasma cells  PET scan at [facility] 07/09/2018- lytic bone lesions, no malignant range activity  Initiation of maintenance Revlimid, 10 mg, 21/28 days 08/15/2018  Cycle 2 maintenance Revlimid 09/12/2018  2.Pain secondary to multiple myeloma involving the spine and pelvis-resolved  MRI of the lumbar spine 10/02/2018-numerous rounded foci in the vertebral bodies, hypertrophy of the L4 transverse process  3.Hypertension-losartan dose increased 11/15/2017  4.Depression  Disposition: Steve Carter appears stable.  He is completing cycle 2 maintenance Revlimid 21 days on/7 days off.  He seems to be tolerating well.  He is taking daily aspirin.  He continues acyclovir and Bactrim prophylaxis.   We reviewed the CBC from today.  Counts are adequate to continue with treatment as above.    He has a follow-up appointment at [facility] next week.  He will return for lab and follow-up here in 1 month.  He will contact the office in the interim with any problems.    [name] ANP/GNP-BC   09/25/2018  2:42 PM

## 2018-10-01 ENCOUNTER — Other Ambulatory Visit: Payer: Self-pay | Admitting: *Deleted

## 2018-10-01 DIAGNOSIS — C9000 Multiple myeloma not having achieved remission: Secondary | ICD-10-CM

## 2018-10-01 MED ORDER — LENALIDOMIDE 10 MG PO CAPS: ORAL_CAPSULE | ORAL | 0 refills | Status: DC

## 2018-10-03 ENCOUNTER — Telehealth: Payer: Self-pay | Admitting: Nurse Practitioner

## 2018-10-03 ENCOUNTER — Encounter: Payer: Self-pay | Admitting: Nurse Practitioner

## 2018-10-03 NOTE — Telephone Encounter (Signed)
Scheduled appt per 10/31 sch message - pt is aware of appts added.

## 2018-10-09 ENCOUNTER — Other Ambulatory Visit: Payer: Self-pay | Admitting: *Deleted

## 2018-10-09 DIAGNOSIS — C9000 Multiple myeloma not having achieved remission: Secondary | ICD-10-CM

## 2018-10-09 MED ORDER — LENALIDOMIDE 10 MG PO CAPS: ORAL_CAPSULE | ORAL | 0 refills | Status: DC

## 2018-10-13 ENCOUNTER — Other Ambulatory Visit: Payer: Self-pay | Admitting: *Deleted

## 2018-10-13 MED ORDER — LOSARTAN POTASSIUM 100 MG PO TABS: 100.0000 mg | ORAL_TABLET | Freq: Every day | ORAL | 0 refills | Status: DC

## 2018-10-16 ENCOUNTER — Encounter: Payer: Self-pay | Admitting: Nurse Practitioner

## 2018-10-16 ENCOUNTER — Inpatient Hospital Stay: Payer: BLUE CROSS/BLUE SHIELD | Attending: Nurse Practitioner | Admitting: Nurse Practitioner

## 2018-10-16 ENCOUNTER — Inpatient Hospital Stay: Payer: BLUE CROSS/BLUE SHIELD

## 2018-10-16 ENCOUNTER — Telehealth: Payer: Self-pay | Admitting: Oncology

## 2018-10-16 VITALS — BP 146/90 | HR 70 | Temp 97.8°F | Resp 18 | Ht 74.0 in | Wt 262.5 lb

## 2018-10-16 DIAGNOSIS — C9000 Multiple myeloma not having achieved remission: Secondary | ICD-10-CM | POA: Diagnosis not present

## 2018-10-16 DIAGNOSIS — I10 Essential (primary) hypertension: Secondary | ICD-10-CM | POA: Diagnosis not present

## 2018-10-16 DIAGNOSIS — Z9221 Personal history of antineoplastic chemotherapy: Secondary | ICD-10-CM

## 2018-10-16 DIAGNOSIS — F329 Major depressive disorder, single episode, unspecified: Secondary | ICD-10-CM | POA: Insufficient documentation

## 2018-10-16 DIAGNOSIS — G893 Neoplasm related pain (acute) (chronic): Secondary | ICD-10-CM | POA: Insufficient documentation

## 2018-10-16 DIAGNOSIS — C9001 Multiple myeloma in remission: Secondary | ICD-10-CM

## 2018-10-16 LAB — CBC WITH DIFFERENTIAL (CANCER CENTER ONLY)
Abs Immature Granulocytes: 0.07 10*3/uL (ref 0.00–0.07)
BASOS ABS: 0.1 10*3/uL (ref 0.0–0.1)
BASOS PCT: 1 %
EOS PCT: 2 %
Eosinophils Absolute: 0.2 10*3/uL (ref 0.0–0.5)
HCT: 45.5 % (ref 39.0–52.0)
HEMOGLOBIN: 15.5 g/dL (ref 13.0–17.0)
Immature Granulocytes: 1 %
Lymphocytes Relative: 16 %
Lymphs Abs: 1.2 10*3/uL (ref 0.7–4.0)
MCH: 30.7 pg (ref 26.0–34.0)
MCHC: 34.1 g/dL (ref 30.0–36.0)
MCV: 90.1 fL (ref 80.0–100.0)
Monocytes Absolute: 0.8 10*3/uL (ref 0.1–1.0)
Monocytes Relative: 11 %
NRBC: 0 % (ref 0.0–0.2)
Neutro Abs: 5.1 10*3/uL (ref 1.7–7.7)
Neutrophils Relative %: 69 %
PLATELETS: 196 10*3/uL (ref 150–400)
RBC: 5.05 MIL/uL (ref 4.22–5.81)
RDW: 14.2 % (ref 11.5–15.5)
WBC: 7.3 10*3/uL (ref 4.0–10.5)

## 2018-10-16 LAB — CMP (CANCER CENTER ONLY)
ALBUMIN: 3.8 g/dL (ref 3.5–5.0)
ALK PHOS: 56 U/L (ref 38–126)
ALT: 59 U/L — ABNORMAL HIGH (ref 0–44)
AST: 32 U/L (ref 15–41)
Anion gap: 9 (ref 5–15)
BUN: 7 mg/dL (ref 6–20)
CO2: 28 mmol/L (ref 22–32)
CREATININE: 1.04 mg/dL (ref 0.61–1.24)
Calcium: 8.8 mg/dL — ABNORMAL LOW (ref 8.9–10.3)
Chloride: 104 mmol/L (ref 98–111)
Glucose, Bld: 101 mg/dL — ABNORMAL HIGH (ref 70–99)
POTASSIUM: 3.6 mmol/L (ref 3.5–5.1)
SODIUM: 141 mmol/L (ref 135–145)
TOTAL PROTEIN: 6.6 g/dL (ref 6.5–8.1)
Total Bilirubin: 0.5 mg/dL (ref 0.3–1.2)

## 2018-10-16 NOTE — Progress Notes (Signed)
  [facility] OFFICE PROGRESS NOTE   Diagnosis: Multiple myeloma  INTERVAL HISTORY:   Steve Carter returns for follow-up.  He continues maintenance Revlimid 21 of 28 days.  He began the most recent cycle 10/10/2018.  He denies nausea/vomiting.  No mouth sores.  He has occasional diarrhea.  He has stable intermittent numbness in the feet.  He mainly notes this at nighttime.  He denies pain.  He has a good appetite.  Objective:  Vital signs in last 24 hours:  Blood pressure (!) 146/90, pulse 70, temperature 97.8 F (36.6 C), temperature source Oral, resp. rate 18, height _0  (1.88 m), weight 262 lb 8 oz (119.1 kg), SpO2 99 %.    HEENT: No thrush or ulcers. Resp: Lungs clear bilaterally. Cardio: Regular rate and rhythm. GI: Abdomen soft and nontender.  No hepatomegaly.  No splenomegaly. Vascular: Trace bilateral pretibial edema.   Lab Results:  Lab Results  Component Value Date   WBC 7.3 10/16/2018   HGB 15.5 10/16/2018   HCT 45.5 10/16/2018   MCV 90.1 10/16/2018   PLT 196 10/16/2018   NEUTROABS 5.1 10/16/2018    Imaging:  No results found.  Medications: I have reviewed the patient's current medications.  Assessment/Plan: 1.Multiple myeloma-IgG lambda serum monoclonal protein, elevated serum lambda light chains  Bone survey 10/11/2017-lytic lesions noted in the skull, right iliac, left second rib, and mottled appearance of the proximal femurs/pelvis  Bone marrow biopsy 10/14/2017-hypercellular marrow with plasma cell neoplasm, 82% plasma cells, lambda light chain restricted, hyperdiploid with gains of chromosomes 3, 5, 7, 9, and 11. FISH panel positive for gain of ATM(+11)  Cycle 1 RVD 10/18/2017 (Revlimid started 10/23/2017)  Cycle 2 RVD 11/15/2017  Cycle 3 RVD 12/13/2017  Cycle 4 RVD 01/14/2018  Cycle 5 of RVD 02/14/2018 (Revlimid given for 7 days and 1 week of Velcade), therapy held beginning 02/21/2018 secondary to plan for stem cell  therapy  Bone marrow biopsy 02/20/2018, 1-2% plasma cell  PET scan 02/20/2018, no malignant range activity above background, numerous lytic lesions throughout the axial and appendicular skeleton,, left iliac wing fracture  Melphalan, 200 mg/m on 03/24/2018  Stem cell infusion 03/25/2018  Restaging at [facility] 07/09/2018: Normal lambda light chains, no serum M spike, bone marrow biopsy negative for myeloma, less than 1% plasma cells  PET scan at [facility] 07/09/2018- lytic bone lesions, no malignant range activity  Initiation of maintenance Revlimid, 10 mg, 21/28 days 08/15/2018  Cycle 2 maintenance Revlimid 09/12/2018  Cycle 3 maintenance Revlimid 10/10/2018  2.Pain secondary to multiple myeloma involving the spine and pelvis-resolved  MRI of the lumbar spine 10/02/2018-numerous rounded foci in the vertebral bodies, hypertrophy of the L4 transverse process  3.Hypertension-losartan dose increased 11/15/2017  4.Depression  Disposition: Steve Carter appears stable.  He will continue maintenance Revlimid as he is currently taking.  We reviewed the CBC from today.  Counts adequate to continue with treatment as above.  He will return for lab, follow-up and Zometa in 1 month.  He will contact the office in the interim with any problems.  Patient seen with Dr. Benay Spice.    [name] ANP/GNP-BC   10/16/2018  3:19 PM  This was a shared visit with [name].  Steve Carter continues maintenance Revlimid.  He will return for an office visit and Zometa in 1 month.  Julieanne Manson, MD

## 2018-10-16 NOTE — Telephone Encounter (Signed)
Scheduled appt per 11/14 los- pt aware of appts per patient not print out needed - my chart active.

## 2018-10-17 ENCOUNTER — Other Ambulatory Visit: Payer: Self-pay | Admitting: *Deleted

## 2018-10-17 DIAGNOSIS — C9000 Multiple myeloma not having achieved remission: Secondary | ICD-10-CM

## 2018-10-17 MED ORDER — LENALIDOMIDE 10 MG PO CAPS: ORAL_CAPSULE | ORAL | 0 refills | Status: DC

## 2018-10-28 ENCOUNTER — Other Ambulatory Visit: Payer: Self-pay | Admitting: Nurse Practitioner

## 2018-10-28 DIAGNOSIS — C9000 Multiple myeloma not having achieved remission: Secondary | ICD-10-CM

## 2018-10-29 ENCOUNTER — Other Ambulatory Visit: Payer: Self-pay | Admitting: Nurse Practitioner

## 2018-10-29 DIAGNOSIS — C9000 Multiple myeloma not having achieved remission: Secondary | ICD-10-CM

## 2018-10-29 MED ORDER — ZOLPIDEM TARTRATE ER 12.5 MG PO TBCR: 12.5000 mg | EXTENDED_RELEASE_TABLET | Freq: Every evening | ORAL | 0 refills | Status: DC | PRN

## 2018-11-04 ENCOUNTER — Telehealth: Payer: Self-pay | Admitting: Oncology

## 2018-11-04 NOTE — Telephone Encounter (Signed)
Printed medical records for [employer], Release ID: [id number]

## 2018-11-06 NOTE — Progress Notes (Signed)
FMLA and medical records successfully faxed to [facility] at [phone]. Mailed copy to patient address on file.

## 2018-11-11 ENCOUNTER — Other Ambulatory Visit: Payer: Self-pay | Admitting: *Deleted

## 2018-11-11 DIAGNOSIS — C9000 Multiple myeloma not having achieved remission: Secondary | ICD-10-CM

## 2018-11-11 MED ORDER — LOSARTAN POTASSIUM 100 MG PO TABS: 100.0000 mg | ORAL_TABLET | Freq: Every day | ORAL | 1 refills | Status: DC

## 2018-11-11 MED ORDER — LENALIDOMIDE 10 MG PO CAPS: ORAL_CAPSULE | ORAL | 0 refills | Status: DC

## 2018-11-13 ENCOUNTER — Inpatient Hospital Stay: Payer: BLUE CROSS/BLUE SHIELD | Admitting: Oncology

## 2018-11-13 ENCOUNTER — Inpatient Hospital Stay: Payer: BLUE CROSS/BLUE SHIELD | Attending: Oncology

## 2018-11-13 ENCOUNTER — Telehealth: Payer: Self-pay | Admitting: Oncology

## 2018-11-13 ENCOUNTER — Inpatient Hospital Stay: Payer: BLUE CROSS/BLUE SHIELD

## 2018-11-13 VITALS — BP 142/84 | HR 72 | Temp 98.3°F | Resp 18 | Ht 74.0 in | Wt 263.4 lb

## 2018-11-13 DIAGNOSIS — R197 Diarrhea, unspecified: Secondary | ICD-10-CM | POA: Diagnosis not present

## 2018-11-13 DIAGNOSIS — F329 Major depressive disorder, single episode, unspecified: Secondary | ICD-10-CM

## 2018-11-13 DIAGNOSIS — C9001 Multiple myeloma in remission: Secondary | ICD-10-CM

## 2018-11-13 DIAGNOSIS — C9000 Multiple myeloma not having achieved remission: Secondary | ICD-10-CM | POA: Diagnosis not present

## 2018-11-13 LAB — CBC WITH DIFFERENTIAL (CANCER CENTER ONLY)
Abs Immature Granulocytes: 0.1 10*3/uL — ABNORMAL HIGH (ref 0.00–0.07)
Basophils Absolute: 0.1 10*3/uL (ref 0.0–0.1)
Basophils Relative: 1 %
Eosinophils Absolute: 0.1 10*3/uL (ref 0.0–0.5)
Eosinophils Relative: 1 %
HCT: 48.4 % (ref 39.0–52.0)
HEMOGLOBIN: 16.3 g/dL (ref 13.0–17.0)
IMMATURE GRANULOCYTES: 1 %
LYMPHS ABS: 0.9 10*3/uL (ref 0.7–4.0)
LYMPHS PCT: 10 %
MCH: 29.7 pg (ref 26.0–34.0)
MCHC: 33.7 g/dL (ref 30.0–36.0)
MCV: 88.3 fL (ref 80.0–100.0)
MONOS PCT: 9 %
Monocytes Absolute: 0.7 10*3/uL (ref 0.1–1.0)
NRBC: 0 % (ref 0.0–0.2)
Neutro Abs: 6.6 10*3/uL (ref 1.7–7.7)
Neutrophils Relative %: 78 %
Platelet Count: 194 10*3/uL (ref 150–400)
RBC: 5.48 MIL/uL (ref 4.22–5.81)
RDW: 13.6 % (ref 11.5–15.5)
WBC Count: 8.5 10*3/uL (ref 4.0–10.5)

## 2018-11-13 LAB — CMP (CANCER CENTER ONLY)
ALBUMIN: 3.7 g/dL (ref 3.5–5.0)
ALK PHOS: 55 U/L (ref 38–126)
ALT: 45 U/L — ABNORMAL HIGH (ref 0–44)
ANION GAP: 10 (ref 5–15)
AST: 26 U/L (ref 15–41)
BUN: 8 mg/dL (ref 6–20)
CO2: 25 mmol/L (ref 22–32)
CREATININE: 0.99 mg/dL (ref 0.61–1.24)
Calcium: 8.3 mg/dL — ABNORMAL LOW (ref 8.9–10.3)
Chloride: 104 mmol/L (ref 98–111)
GFR, Est AFR Am: >60 mL/min (ref >60)
GFR, Estimated: >60 mL/min (ref >60)
Glucose, Bld: 124 mg/dL — ABNORMAL HIGH (ref 70–99)
Potassium: 3.4 mmol/L — ABNORMAL LOW (ref 3.5–5.1)
Sodium: 139 mmol/L (ref 135–145)
Total Bilirubin: 0.5 mg/dL (ref 0.3–1.2)
Total Protein: 6.5 g/dL (ref 6.5–8.1)

## 2018-11-13 MED ORDER — CALCIUM CARBONATE-VITAMIN D 600-400 MG-UNIT PO CHEW: 1.0000 | CHEWABLE_TABLET | Freq: Two times a day (BID) | ORAL | Status: DC

## 2018-11-13 MED ORDER — SODIUM CHLORIDE 0.9 % IV SOLN
Freq: Once | INTRAVENOUS | Status: AC
  Administered 2018-11-13: 09:00:00 via INTRAVENOUS
  Filled 2018-11-13: qty 250

## 2018-11-13 MED ORDER — ZOLEDRONIC ACID 4 MG/100ML IV SOLN
4.0000 mg | Freq: Once | INTRAVENOUS | Status: AC
  Administered 2018-11-13: 4 mg via INTRAVENOUS
  Filled 2018-11-13: qty 100

## 2018-11-13 NOTE — Progress Notes (Signed)
  [place] OFFICE PROGRESS NOTE   Diagnosis: Multiple myeloma  INTERVAL HISTORY:   Steve Carter returns as scheduled.  He feels well.  He continues Revlimid.  He has occasional diarrhea, but no consistent diarrhea.  No pain.  Objective:  Vital signs in last 24 hours:  Blood pressure (!) 154/97, pulse 72, temperature 98.3 F (36.8 C), temperature source Oral, resp. rate 18, height _0  (1.88 m), weight 263 lb 6.4 oz (119.5 kg), SpO2 98 %.    HEENT: No thrush or ulcers, mild left angular colitis Resp: Lungs clear bilaterally Cardio: Regular rate and rhythm GI: No hepatosplenomegaly Vascular: No leg edema   Lab Results:  Lab Results  Component Value Date   WBC 8.5 11/13/2018   HGB 16.3 11/13/2018   HCT 48.4 11/13/2018   MCV 88.3 11/13/2018   PLT 194 11/13/2018   NEUTROABS 6.6 11/13/2018    CMP  Lab Results  Component Value Date   NA 139 11/13/2018   K 3.4 (L) 11/13/2018   CL 104 11/13/2018   CO2 25 11/13/2018   GLUCOSE 124 (H) 11/13/2018   BUN 8 11/13/2018   CREATININE 0.99 11/13/2018   CALCIUM 8.3 (L) 11/13/2018   PROT 6.5 11/13/2018   ALBUMIN 3.7 11/13/2018   AST 26 11/13/2018   ALT 45 (H) 11/13/2018   ALKPHOS 55 11/13/2018   BILITOT 0.5 11/13/2018   GFRNONAA >60 11/13/2018   GFRAA >60 11/13/2018     Medications: I have reviewed the patient's current medications.   Assessment/Plan: 1.Multiple myeloma-IgG lambda serum monoclonal protein, elevated serum lambda light chains  Bone survey 10/11/2017-lytic lesions noted in the skull, right iliac, left second rib, and mottled appearance of the proximal femurs/pelvis  Bone marrow biopsy 10/14/2017-hypercellular marrow with plasma cell neoplasm, 82% plasma cells, lambda light chain restricted, hyperdiploid with gains of chromosomes 3, 5, 7, 9, and 11. FISH panel positive for gain of ATM(+11)  Cycle 1 RVD 10/18/2017 (Revlimid started 10/23/2017)  Cycle 2 RVD 11/15/2017  Cycle 3 RVD  12/13/2017  Cycle 4 RVD 01/14/2018  Cycle 5 of RVD 02/14/2018 (Revlimid given for 7 days and 1 week of Velcade), therapy held beginning 02/21/2018 secondary to plan for stem cell therapy  Bone marrow biopsy 02/20/2018, 1-2% plasma cell  PET scan 02/20/2018, no malignant range activity above background, numerous lytic lesions throughout the axial and appendicular skeleton,, left iliac wing fracture  Melphalan, 200 mg/m on 03/24/2018  Stem cell infusion 03/25/2018  Restaging at [facility] 07/09/2018: Normal lambda light chains, no serum M spike, bone marrow biopsy negative for myeloma, less than 1% plasma cells  PET scan at [facility] 07/09/2018- lytic bone lesions, no malignant range activity  Initiation of maintenance Revlimid, 10 mg, 21/28 days 08/15/2018  Cycle 2 maintenance Revlimid 09/12/2018  Cycle 3 maintenance Revlimid 10/10/2018   2.Pain secondary to multiple myeloma involving the spine and pelvis-resolved  MRI of the lumbar spine 10/02/2018-numerous rounded foci in the vertebral bodies, hypertrophy of the L4 transverse process  3.Hypertension-losartan dose increased 11/15/2017  4.Depression   Disposition: Mr. Nakanishi appears stable.  He will continue maintenance Revlimid.  He will receive Zometa today.  He will complete revaccination as recommended by the [facility] transplant team.  He is scheduled for an office visit and myeloma labs at [facility] in January.  He will return for an office visit here in approximately 2 months.    Betsy Coder, MD  11/13/2018  8:49 AM

## 2018-11-13 NOTE — Patient Instructions (Signed)
Zoledronic Acid injection (Hypercalcemia, Oncology) What is this medicine? ZOLEDRONIC ACID (ZOE le dron ik AS id) lowers the amount of calcium loss from bone. It is used to treat too much calcium in your blood from cancer. It is also used to prevent complications of cancer that has spread to the bone. This medicine may be used for other purposes; ask your health care provider or pharmacist if you have questions. COMMON BRAND NAME(S): Zometa What should I tell my health care provider before I take this medicine? They need to know if you have any of these conditions: -aspirin-sensitive asthma -cancer, especially if you are receiving medicines used to treat cancer -dental disease or wear dentures -infection -kidney disease -receiving corticosteroids like dexamethasone or prednisone -an unusual or allergic reaction to zoledronic acid, other medicines, foods, dyes, or preservatives -pregnant or trying to get pregnant -breast-feeding How should I use this medicine? This medicine is for infusion into a vein. It is given by a health care professional in a hospital or clinic setting. Talk to your pediatrician regarding the use of this medicine in children. Special care may be needed. Overdosage: If you think you have taken too much of this medicine contact a poison control center or emergency room at once. NOTE: This medicine is only for you. Do not share this medicine with others. What if I miss a dose? It is important not to miss your dose. Call your doctor or health care professional if you are unable to keep an appointment. What may interact with this medicine? -certain antibiotics given by injection -NSAIDs, medicines for pain and inflammation, like ibuprofen or naproxen -some diuretics like bumetanide, furosemide -teriparatide -thalidomide This list may not describe all possible interactions. Give your health care provider a list of all the medicines, herbs, non-prescription drugs, or  dietary supplements you use. Also tell them if you smoke, drink alcohol, or use illegal drugs. Some items may interact with your medicine. What should I watch for while using this medicine? Visit your doctor or health care professional for regular checkups. It may be some time before you see the benefit from this medicine. Do not stop taking your medicine unless your doctor tells you to. Your doctor may order blood tests or other tests to see how you are doing. Women should inform their doctor if they wish to become pregnant or think they might be pregnant. There is a potential for serious side effects to an unborn child. Talk to your health care professional or pharmacist for more information. You should make sure that you get enough calcium and vitamin D while you are taking this medicine. Discuss the foods you eat and the vitamins you take with your health care professional. Some people who take this medicine have severe bone, joint, and/or muscle pain. This medicine may also increase your risk for jaw problems or a broken thigh bone. Tell your doctor right away if you have severe pain in your jaw, bones, joints, or muscles. Tell your doctor if you have any pain that does not go away or that gets worse. Tell your dentist and dental surgeon that you are taking this medicine. You should not have major dental surgery while on this medicine. See your dentist to have a dental exam and fix any dental problems before starting this medicine. Take good care of your teeth while on this medicine. Make sure you see your dentist for regular follow-up appointments. What side effects may I notice from receiving this medicine? Side effects that   you should report to your doctor or health care professional as soon as possible: -allergic reactions like skin rash, itching or hives, swelling of the face, lips, or tongue -anxiety, confusion, or depression -breathing problems -changes in vision -eye pain -feeling faint or  lightheaded, falls -jaw pain, especially after dental work -mouth sores -muscle cramps, stiffness, or weakness -redness, blistering, peeling or loosening of the skin, including inside the mouth -trouble passing urine or change in the amount of urine Side effects that usually do not require medical attention (report to your doctor or health care professional if they continue or are bothersome): -bone, joint, or muscle pain -constipation -diarrhea -fever -hair loss -irritation at site where injected -loss of appetite -nausea, vomiting -stomach upset -trouble sleeping -trouble swallowing -weak or tired This list may not describe all possible side effects. Call your doctor for medical advice about side effects. You may report side effects to FDA at 1-800-FDA-1088. Where should I keep my medicine? This drug is given in a hospital or clinic and will not be stored at home. NOTE: This sheet is a summary. It may not cover all possible information. If you have questions about this medicine, talk to your doctor, pharmacist, or health care provider.  2018 Elsevier/Gold Standard (2014-04-17 14:19:39)  Influenza (Flu) Vaccine (Inactivated or Recombinant): What You Need to Know 1. Why get vaccinated? Influenza ("flu") is a contagious disease that spreads around the [place] every year, usually between October and May. Flu is caused by influenza viruses, and is spread mainly by coughing, sneezing, and close contact. Anyone can get flu. Flu strikes suddenly and can last several days. Symptoms vary by age, but can include:  fever/chills  sore throat  muscle aches  fatigue  cough  headache  runny or stuffy nose  Flu can also lead to pneumonia and blood infections, and cause diarrhea and seizures in children. If you have a medical condition, such as heart or lung disease, flu can make it worse. Flu is more dangerous for some people. Infants and young children, people 41 years of age  and older, pregnant women, and people with certain health conditions or a weakened immune system are at greatest risk. Each year thousands of people in the [place] die from flu, and many more are hospitalized. Flu vaccine can:  keep you from getting flu,  make flu less severe if you do get it, and  keep you from spreading flu to your family and other people. 2. Inactivated and recombinant flu vaccines A dose of flu vaccine is recommended every flu season. Children 6 months through 57 years of age may need two doses during the same flu season. Everyone else needs only one dose each flu season. Some inactivated flu vaccines contain a very small amount of a mercury-based preservative called thimerosal. Studies have not shown thimerosal in vaccines to be harmful, but flu vaccines that do not contain thimerosal are available. There is no live flu virus in flu shots. They cannot cause the flu. There are many flu viruses, and they are always changing. Each year a new flu vaccine is made to protect against three or four viruses that are likely to cause disease in the upcoming flu season. But even when the vaccine doesn't exactly match these viruses, it may still provide some protection. Flu vaccine cannot prevent:  flu that is caused by a virus not covered by the vaccine, or  illnesses that look like flu but are not.  It takes about 2  weeks for protection to develop after vaccination, and protection lasts through the flu season. 3. Some people should not get this vaccine Tell the person who is giving you the vaccine:  If you have any severe, life-threatening allergies. If you ever had a life-threatening allergic reaction after a dose of flu vaccine, or have a severe allergy to any part of this vaccine, you may be advised not to get vaccinated. Most, but not all, types of flu vaccine contain a small amount of egg protein.  If you ever had Guillain-Barr Syndrome (also called GBS). Some people  with a history of GBS should not get this vaccine. This should be discussed with your doctor.  If you are not feeling well. It is usually okay to get flu vaccine when you have a mild illness, but you might be asked to come back when you feel better.  4. Risks of a vaccine reaction With any medicine, including vaccines, there is a chance of reactions. These are usually mild and go away on their own, but serious reactions are also possible. Most people who get a flu shot do not have any problems with it. Minor problems following a flu shot include:  soreness, redness, or swelling where the shot was given  hoarseness  sore, red or itchy eyes  cough  fever  aches  headache  itching  fatigue  If these problems occur, they usually begin soon after the shot and last 1 or 2 days. More serious problems following a flu shot can include the following:  There may be a small increased risk of Guillain-Barre Syndrome (GBS) after inactivated flu vaccine. This risk has been estimated at 1 or 2 additional cases per million people vaccinated. This is much lower than the risk of severe complications from flu, which can be prevented by flu vaccine.  Young children who get the flu shot along with pneumococcal vaccine (PCV13) and/or DTaP vaccine at the same time might be slightly more likely to have a seizure caused by fever. Ask your doctor for more information. Tell your doctor if a child who is getting flu vaccine has ever had a seizure.  Problems that could happen after any injected vaccine:  People sometimes faint after a medical procedure, including vaccination. Sitting or lying down for about 15 minutes can help prevent fainting, and injuries caused by a fall. Tell your doctor if you feel dizzy, or have vision changes or ringing in the ears.  Some people get severe pain in the shoulder and have difficulty moving the arm where a shot was given. This happens very rarely.  Any medication can  cause a severe allergic reaction. Such reactions from a vaccine are very rare, estimated at about 1 in a million doses, and would happen within a few minutes to a few hours after the vaccination. As with any medicine, there is a very remote chance of a vaccine causing a serious injury or death. The safety of vaccines is always being monitored. For more information, visit: http://www.aguilar.org/ 5. What if there is a serious reaction? What should I look for? Look for anything that concerns you, such as signs of a severe allergic reaction, very high fever, or unusual behavior. Signs of a severe allergic reaction can include hives, swelling of the face and throat, difficulty breathing, a fast heartbeat, dizziness, and weakness. These would start a few minutes to a few hours after the vaccination. What should I do?  If you think it is a severe allergic reaction  or other emergency that can't wait, call 9-1-1 and get the person to the nearest hospital. Otherwise, call your doctor.  Reactions should be reported to the Vaccine Adverse Event Reporting System (VAERS). Your doctor should file this report, or you can do it yourself through the VAERS web site at www.vaers.SamedayNews.es, or by calling (815) 458-8587. ? VAERS does not give medical advice. 6. The National Vaccine Injury Compensation Program The [employer] Vaccine Injury Compensation Program (VICP) is a federal program that was created to compensate people who may have been injured by certain vaccines. Persons who believe they may have been injured by a vaccine can learn about the program and about filing a claim by calling 331-471-7576 or visiting the [place] website at GoldCloset.com.ee. There is a time limit to file a claim for compensation. 7. How can I learn more?  Ask your healthcare provider. He or she can give you the vaccine package insert or suggest other sources of information.  Call your local or state health  department.  Contact the Centers for Disease Control and Prevention (CDC): ? Call (774)833-8210 (1-800-CDC-INFO) or ? Visit CDC's website at https://gibson.com/ Vaccine Information Statement, Inactivated Influenza Vaccine (07/09/2014) This information is not intended to replace advice given to you by your health care provider. Make sure you discuss any questions you have with your health care provider. Document Released: 09/13/2006 Document Revised: 08/09/2016 Document Reviewed: 08/09/2016 Elsevier Interactive Patient Education  2017 [employer].

## 2018-11-13 NOTE — Progress Notes (Signed)
Per Dr. Benay Spice: OK to treat with corrected calcium result of 8.5. Instructed patient to start Caltrate D twice daily

## 2018-11-13 NOTE — Telephone Encounter (Signed)
Scheduled appt per 12/12 los - sent reminder letter in the mail with appt date and time

## 2018-11-14 ENCOUNTER — Encounter: Payer: Self-pay | Admitting: *Deleted

## 2018-11-14 ENCOUNTER — Encounter: Payer: Self-pay | Admitting: Oncology

## 2018-11-14 ENCOUNTER — Other Ambulatory Visit: Payer: Self-pay | Admitting: *Deleted

## 2018-11-14 NOTE — Progress Notes (Signed)
Patient reports he started the current cycle of Revlimid 10 mg on 11/03/18 per mychart message from wife.

## 2018-11-19 ENCOUNTER — Other Ambulatory Visit: Payer: Self-pay | Admitting: *Deleted

## 2018-11-19 DIAGNOSIS — C9000 Multiple myeloma not having achieved remission: Secondary | ICD-10-CM

## 2018-11-19 MED ORDER — ACYCLOVIR 800 MG PO TABS: 800.0000 mg | ORAL_TABLET | Freq: Two times a day (BID) | ORAL | 3 refills | Status: DC

## 2018-11-19 NOTE — Progress Notes (Signed)
FMLA successfully faxed to [place] at [phone]. Mailed copy to patient address on file.

## 2018-11-28 ENCOUNTER — Ambulatory Visit: Payer: BLUE CROSS/BLUE SHIELD

## 2018-11-30 ENCOUNTER — Encounter: Payer: Self-pay | Admitting: Oncology

## 2018-12-01 ENCOUNTER — Other Ambulatory Visit: Payer: Self-pay | Admitting: Nurse Practitioner

## 2018-12-01 DIAGNOSIS — C9000 Multiple myeloma not having achieved remission: Secondary | ICD-10-CM

## 2018-12-01 MED ORDER — ZOLPIDEM TARTRATE ER 12.5 MG PO TBCR: 12.5000 mg | EXTENDED_RELEASE_TABLET | Freq: Every evening | ORAL | 0 refills | Status: DC | PRN

## 2018-12-08 ENCOUNTER — Encounter: Payer: Self-pay | Admitting: Oncology

## 2018-12-10 ENCOUNTER — Other Ambulatory Visit: Payer: Self-pay | Admitting: Oncology

## 2018-12-10 DIAGNOSIS — C9000 Multiple myeloma not having achieved remission: Secondary | ICD-10-CM

## 2018-12-18 ENCOUNTER — Telehealth: Payer: Self-pay | Admitting: *Deleted

## 2018-12-18 NOTE — Telephone Encounter (Signed)
Representative left message requesting updated information on his current status and reports this is "time sensitive". Have already received information from 11/2018. Refer to claim #[id number]

## 2018-12-18 NOTE — Telephone Encounter (Signed)
Called Joms at [facility] and was able to answer her questions except for the following: 1. Is patient still disabled from any occupation? 2. What are his restrictions? 3. How many hours per week could he work? 4. Restrictions if he returns to work? Form was faxed today to [phone]. Informed representative that I am unable to answer these questions or confirm the accuracy of the fax #. Will forward this note to our disability specialist to complete.

## 2018-12-23 ENCOUNTER — Encounter: Payer: Self-pay | Admitting: Oncology

## 2018-12-23 ENCOUNTER — Encounter: Payer: Self-pay | Admitting: *Deleted

## 2018-12-27 ENCOUNTER — Other Ambulatory Visit: Payer: Self-pay | Admitting: Oncology

## 2018-12-30 ENCOUNTER — Encounter: Payer: Self-pay | Admitting: Oncology

## 2018-12-31 ENCOUNTER — Other Ambulatory Visit: Payer: Self-pay | Admitting: *Deleted

## 2018-12-31 DIAGNOSIS — C9000 Multiple myeloma not having achieved remission: Secondary | ICD-10-CM

## 2018-12-31 MED ORDER — ZOLPIDEM TARTRATE ER 12.5 MG PO TBCR: 12.5000 mg | EXTENDED_RELEASE_TABLET | Freq: Every evening | ORAL | 0 refills | Status: DC | PRN

## 2019-01-02 ENCOUNTER — Encounter: Payer: Self-pay | Admitting: Oncology

## 2019-01-05 NOTE — Telephone Encounter (Signed)
Forwarded message to Dr. Benay Spice

## 2019-01-07 ENCOUNTER — Encounter: Payer: Self-pay | Admitting: *Deleted

## 2019-01-07 ENCOUNTER — Encounter: Payer: Self-pay | Admitting: Nurse Practitioner

## 2019-01-13 ENCOUNTER — Other Ambulatory Visit: Payer: Self-pay | Admitting: Oncology

## 2019-01-13 DIAGNOSIS — C9000 Multiple myeloma not having achieved remission: Secondary | ICD-10-CM

## 2019-01-20 ENCOUNTER — Other Ambulatory Visit: Payer: Self-pay | Admitting: Oncology

## 2019-01-20 ENCOUNTER — Other Ambulatory Visit: Payer: BLUE CROSS/BLUE SHIELD

## 2019-01-20 ENCOUNTER — Ambulatory Visit: Payer: BLUE CROSS/BLUE SHIELD | Admitting: Nurse Practitioner

## 2019-01-27 ENCOUNTER — Encounter: Payer: Self-pay | Admitting: Nurse Practitioner

## 2019-01-28 ENCOUNTER — Other Ambulatory Visit: Payer: Self-pay | Admitting: *Deleted

## 2019-01-28 DIAGNOSIS — C9000 Multiple myeloma not having achieved remission: Secondary | ICD-10-CM

## 2019-01-28 MED ORDER — ZOLPIDEM TARTRATE ER 12.5 MG PO TBCR: 12.5000 mg | EXTENDED_RELEASE_TABLET | Freq: Every evening | ORAL | 0 refills | Status: DC | PRN

## 2019-01-29 ENCOUNTER — Ambulatory Visit: Payer: BLUE CROSS/BLUE SHIELD

## 2019-01-29 ENCOUNTER — Other Ambulatory Visit: Payer: BLUE CROSS/BLUE SHIELD

## 2019-01-29 ENCOUNTER — Ambulatory Visit: Payer: BLUE CROSS/BLUE SHIELD | Admitting: Nurse Practitioner

## 2019-02-03 ENCOUNTER — Other Ambulatory Visit: Payer: BLUE CROSS/BLUE SHIELD

## 2019-02-03 ENCOUNTER — Ambulatory Visit: Payer: BLUE CROSS/BLUE SHIELD | Admitting: Nurse Practitioner

## 2019-02-03 ENCOUNTER — Ambulatory Visit: Payer: BLUE CROSS/BLUE SHIELD

## 2019-02-09 ENCOUNTER — Other Ambulatory Visit: Payer: Self-pay | Admitting: Oncology

## 2019-02-09 ENCOUNTER — Inpatient Hospital Stay: Payer: BLUE CROSS/BLUE SHIELD

## 2019-02-09 ENCOUNTER — Encounter: Payer: Self-pay | Admitting: Nurse Practitioner

## 2019-02-09 ENCOUNTER — Other Ambulatory Visit: Payer: Self-pay

## 2019-02-09 ENCOUNTER — Inpatient Hospital Stay: Payer: BLUE CROSS/BLUE SHIELD | Admitting: Nurse Practitioner

## 2019-02-09 ENCOUNTER — Inpatient Hospital Stay: Payer: BLUE CROSS/BLUE SHIELD | Attending: Oncology

## 2019-02-09 VITALS — BP 159/98 | HR 70 | Temp 98.3°F | Resp 19 | Ht 74.0 in | Wt 245.1 lb

## 2019-02-09 DIAGNOSIS — G893 Neoplasm related pain (acute) (chronic): Secondary | ICD-10-CM

## 2019-02-09 DIAGNOSIS — C9001 Multiple myeloma in remission: Secondary | ICD-10-CM

## 2019-02-09 DIAGNOSIS — Z79899 Other long term (current) drug therapy: Secondary | ICD-10-CM | POA: Insufficient documentation

## 2019-02-09 DIAGNOSIS — F329 Major depressive disorder, single episode, unspecified: Secondary | ICD-10-CM

## 2019-02-09 DIAGNOSIS — I10 Essential (primary) hypertension: Secondary | ICD-10-CM | POA: Insufficient documentation

## 2019-02-09 DIAGNOSIS — C9000 Multiple myeloma not having achieved remission: Secondary | ICD-10-CM | POA: Insufficient documentation

## 2019-02-09 LAB — COMPREHENSIVE METABOLIC PANEL
ALK PHOS: 61 U/L (ref 38–126)
ALT: 43 U/L (ref 0–44)
AST: 23 U/L (ref 15–41)
Albumin: 4 g/dL (ref 3.5–5.0)
Anion gap: 9 (ref 5–15)
BILIRUBIN TOTAL: 0.3 mg/dL (ref 0.3–1.2)
BUN: 9 mg/dL (ref 6–20)
CALCIUM: 8.6 mg/dL — AB (ref 8.9–10.3)
CO2: 29 mmol/L (ref 22–32)
Chloride: 102 mmol/L (ref 98–111)
Creatinine, Ser: 0.97 mg/dL (ref 0.61–1.24)
GFR calc Af Amer: >60 mL/min (ref >60)
GFR calc non Af Amer: >60 mL/min (ref >60)
Glucose, Bld: 150 mg/dL — ABNORMAL HIGH (ref 70–99)
Potassium: 3.6 mmol/L (ref 3.5–5.1)
Sodium: 140 mmol/L (ref 135–145)
TOTAL PROTEIN: 6.9 g/dL (ref 6.5–8.1)

## 2019-02-09 LAB — CBC WITH DIFFERENTIAL (CANCER CENTER ONLY)
Abs Immature Granulocytes: 0.06 10*3/uL (ref 0.00–0.07)
Basophils Absolute: 0.1 10*3/uL (ref 0.0–0.1)
Basophils Relative: 1 %
Eosinophils Absolute: 0.1 10*3/uL (ref 0.0–0.5)
Eosinophils Relative: 2 %
HCT: 48.1 % (ref 39.0–52.0)
Hemoglobin: 16.3 g/dL (ref 13.0–17.0)
Immature Granulocytes: 1 %
Lymphocytes Relative: 10 %
Lymphs Abs: 0.9 10*3/uL (ref 0.7–4.0)
MCH: 30.3 pg (ref 26.0–34.0)
MCHC: 33.9 g/dL (ref 30.0–36.0)
MCV: 89.4 fL (ref 80.0–100.0)
MONO ABS: 0.9 10*3/uL (ref 0.1–1.0)
Monocytes Relative: 10 %
Neutro Abs: 7 10*3/uL (ref 1.7–7.7)
Neutrophils Relative %: 76 %
Platelet Count: 168 10*3/uL (ref 150–400)
RBC: 5.38 MIL/uL (ref 4.22–5.81)
RDW: 14.1 % (ref 11.5–15.5)
WBC Count: 9.1 10*3/uL (ref 4.0–10.5)
nRBC: 0 % (ref 0.0–0.2)

## 2019-02-09 MED ORDER — HEPATITIS B VAC RECOMBINANT 20 MCG/ML IJ SUSP: 2.0000 mL | Freq: Once | INTRAMUSCULAR | Status: DC

## 2019-02-09 MED ORDER — HAEMOPHILUS B POLYSAC CONJ VAC IM SOLR
0.5000 mL | Freq: Once | INTRAMUSCULAR | Status: AC
  Administered 2019-02-09: 0.5 mL via INTRAMUSCULAR
  Filled 2019-02-09: qty 0.5

## 2019-02-09 MED ORDER — PNEUMOCOCCAL 13-VAL CONJ VACC IM SUSP: 0.5000 mL | Freq: Once | INTRAMUSCULAR | Status: DC

## 2019-02-09 MED ORDER — PNEUMOCOCCAL 13-VAL CONJ VACC IM SUSP
0.5000 mL | Freq: Once | INTRAMUSCULAR | Status: AC
  Administered 2019-02-09: 0.5 mL via INTRAMUSCULAR
  Filled 2019-02-09: qty 0.5

## 2019-02-09 MED ORDER — ZOLEDRONIC ACID 4 MG/100ML IV SOLN
4.0000 mg | Freq: Once | INTRAVENOUS | Status: AC
  Administered 2019-02-09: 4 mg via INTRAVENOUS
  Filled 2019-02-09: qty 100

## 2019-02-09 MED ORDER — HAEMOPHILUS B POLYSAC CONJ VAC IM SOLR: 0.5000 mL | Freq: Once | INTRAMUSCULAR | Status: DC

## 2019-02-09 MED ORDER — SODIUM CHLORIDE 0.9 % IV SOLN
Freq: Once | INTRAVENOUS | Status: AC
  Administered 2019-02-09: 11:00:00 via INTRAVENOUS
  Filled 2019-02-09: qty 250

## 2019-02-09 MED ORDER — DTAP-IPV VACCINE IM SUSP
0.5000 mL | Freq: Once | INTRAMUSCULAR | Status: AC
  Administered 2019-02-09: 0.5 mL via INTRAMUSCULAR
  Filled 2019-02-09: qty 0.5

## 2019-02-09 MED ORDER — DTAP-IPV VACCINE IM SUSP: 0.5000 mL | Freq: Once | INTRAMUSCULAR | Status: DC

## 2019-02-09 MED ORDER — HEPATITIS B VAC RECOMBINANT 20 MCG/ML IJ SUSP
2.0000 mL | Freq: Once | INTRAMUSCULAR | Status: AC
  Administered 2019-02-09: 40 ug via INTRAMUSCULAR
  Filled 2019-02-09: qty 2

## 2019-02-09 MED ORDER — PNEUMOCOCCAL VAC POLYVALENT 25 MCG/0.5ML IJ INJ: 0.5000 mL | INJECTION | Freq: Once | INTRAMUSCULAR | Status: DC

## 2019-02-09 MED ORDER — MEASLES, MUMPS & RUBELLA VAC IJ SOLR: 0.5000 mL | Freq: Once | INTRAMUSCULAR | Status: DC

## 2019-02-09 MED ORDER — VARICELLA VIRUS VACCINE LIVE 1350 PFU/0.5ML IJ SUSR: 0.5000 mL | Freq: Once | INTRAMUSCULAR | Status: DC

## 2019-02-09 NOTE — Progress Notes (Signed)
Spoke with patient and spouse regarding calcium level and oral supplement. Patient stated that he has not taken the calcium supplement for 2 weeks but still has it and will resume today.

## 2019-02-09 NOTE — Patient Instructions (Signed)

## 2019-02-09 NOTE — Progress Notes (Signed)
  [place] OFFICE PROGRESS NOTE   Diagnosis: Multiple myeloma  INTERVAL HISTORY:   [name] returns as scheduled.  He continues Revlimid.  He overall feels well.  No nausea or vomiting. No mouth sores.  He notes less diarrhea.  Also less neuropathy symptoms involving the feet.  He denies pain.  He has noted some improvement in his memory.  He has lost about 20 pounds since returning to work.  Objective:  Vital signs in last 24 hours:  Blood pressure (!) 159/98, pulse 70, temperature 98.3 F (36.8 C), temperature source Oral, resp. rate 19, height _0  (1.88 m), weight 245 lb 1.6 oz (111.2 kg), SpO2 99 %.    HEENT: No thrush or ulcers. Resp: Lungs clear bilaterally. Cardio: Regular rate and rhythm. GI: Abdomen soft and nontender.  No hepatomegaly.  No splenomegaly. Vascular: No leg edema.  Calves soft and nontender.  Lab Results:  Lab Results  Component Value Date   WBC 9.1 02/09/2019   HGB 16.3 02/09/2019   HCT 48.1 02/09/2019   MCV 89.4 02/09/2019   PLT 168 02/09/2019   NEUTROABS 7.0 02/09/2019    Imaging:  No results found.  Medications: I have reviewed the patient's current medications.  Assessment/Plan: 1.Multiple myeloma-IgG lambda serum monoclonal protein, elevated serum lambda light chains  Bone survey 10/11/2017-lytic lesions noted in the skull, right iliac, left second rib, and mottled appearance of the proximal femurs/pelvis  Bone marrow biopsy 10/14/2017-hypercellular marrow with plasma cell neoplasm, 82% plasma cells, lambda light chain restricted, hyperdiploid with gains of chromosomes 3, 5, 7, 9, and 11. FISH panel positive for gain of ATM(+11)  Cycle 1 RVD 10/18/2017 (Revlimid started 10/23/2017)  Cycle 2 RVD 11/15/2017  Cycle 3 RVD 12/13/2017  Cycle 4 RVD 01/14/2018  Cycle 5 of RVD 02/14/2018 (Revlimid given for 7 days and 1 week of Velcade), therapy held beginning 02/21/2018 secondary to plan for stem cell  therapy  Bone marrow biopsy 02/20/2018, 1-2% plasma cell  PET scan 02/20/2018, no malignant range activity above background, numerous lytic lesions throughout the axial and appendicular skeleton,, left iliac wing fracture  Melphalan, 200 mg/m on 03/24/2018  Stem cell infusion 03/25/2018  Restaging at [facility] 07/09/2018: Normal lambda light chains, no serum M spike, bone marrow biopsy negative for myeloma, less than 1% plasma cells  PET scan at [facility] 07/09/2018- lytic bone lesions, no malignant range activity  Initiation of maintenance Revlimid, 10 mg, 21/28 days 08/15/2018  Cycle 2 maintenance Revlimid 09/12/2018  Cycle 3 maintenance Revlimid 10/10/2018   2.Pain secondary to multiple myeloma involving the spine and pelvis-resolved  MRI of the lumbar spine 10/02/2018-numerous rounded foci in the vertebral bodies, hypertrophy of the L4 transverse process  3.Hypertension-losartan dose increased 11/15/2017  4.Depression    Disposition: [name] appears stable.  He will continue maintenance Revlimid.  He will receive a Zometa infusion today.  He is also scheduled to receive vaccines as recommended by the [facility] transplant team.  He will return for a follow-up CBC 02/23/2019 prior to beginning the next cycle of maintenance Revlimid.  He has an appointment at [facility] in April.  He will return for lab and a follow-up visit here in approximately 2 months.  He will contact the office in the interim with any problems.  Plan reviewed with Dr. Benay Spice.    Steve Carter ANP/GNP-BC   02/09/2019  9:46 AM

## 2019-02-09 NOTE — Progress Notes (Signed)
Ca = 8.6 today. I s/w [name], NP and she got ok from Dr. Benay Spice to proceed w/ Zometa today.   Per Heinz Knuckles, RN, pt is not taking Ca suppl at home.  Dr. Gearldine Shown staff will d/w pt. Kennith Center, Pharm.D., CPP 02/09/2019@11 :22 AM

## 2019-02-09 NOTE — Progress Notes (Signed)
Orders for Pediarix and Hep B (Engerix-B) changed -  Discontinued Pediarix since only contains 10 mcg HepB Surf Ag. Added Kinrix & added Engerix-B.  Pt will get 2 mL of Engerix-B = 40 mcg total per [facility] protocol. Future dose of Engerix-B changed as well. [name], NP aware.  Kennith Center, Pharm.D., CPP 02/09/2019@9 :50 AM

## 2019-02-10 ENCOUNTER — Telehealth: Payer: Self-pay | Admitting: Oncology

## 2019-02-10 NOTE — Telephone Encounter (Signed)
Scheduled appt per 3/10 sch message - pt is aware of appt date and time   

## 2019-02-22 ENCOUNTER — Encounter: Payer: Self-pay | Admitting: Nurse Practitioner

## 2019-02-23 ENCOUNTER — Inpatient Hospital Stay: Payer: BLUE CROSS/BLUE SHIELD

## 2019-02-23 ENCOUNTER — Other Ambulatory Visit: Payer: Self-pay

## 2019-02-23 ENCOUNTER — Other Ambulatory Visit: Payer: Self-pay | Admitting: Nurse Practitioner

## 2019-02-23 DIAGNOSIS — C9000 Multiple myeloma not having achieved remission: Secondary | ICD-10-CM | POA: Diagnosis not present

## 2019-02-23 DIAGNOSIS — C9001 Multiple myeloma in remission: Secondary | ICD-10-CM

## 2019-02-23 LAB — CBC WITH DIFFERENTIAL (CANCER CENTER ONLY)
Abs Immature Granulocytes: 0.17 10*3/uL — ABNORMAL HIGH (ref 0.00–0.07)
BASOS ABS: 0.1 10*3/uL (ref 0.0–0.1)
Basophils Relative: 1 %
Eosinophils Absolute: 0.2 10*3/uL (ref 0.0–0.5)
Eosinophils Relative: 2 %
HEMATOCRIT: 48.9 % (ref 39.0–52.0)
Hemoglobin: 16.4 g/dL (ref 13.0–17.0)
Immature Granulocytes: 2 %
LYMPHS ABS: 1.2 10*3/uL (ref 0.7–4.0)
Lymphocytes Relative: 12 %
MCH: 30.1 pg (ref 26.0–34.0)
MCHC: 33.5 g/dL (ref 30.0–36.0)
MCV: 89.7 fL (ref 80.0–100.0)
Monocytes Absolute: 0.5 10*3/uL (ref 0.1–1.0)
Monocytes Relative: 5 %
Neutro Abs: 7.4 10*3/uL (ref 1.7–7.7)
Neutrophils Relative %: 78 %
Platelet Count: 228 10*3/uL (ref 150–400)
RBC: 5.45 MIL/uL (ref 4.22–5.81)
RDW: 14.3 % (ref 11.5–15.5)
WBC Count: 9.5 10*3/uL (ref 4.0–10.5)
nRBC: 0 % (ref 0.0–0.2)

## 2019-02-23 MED ORDER — ZOLPIDEM TARTRATE ER 12.5 MG PO TBCR: 12.5000 mg | EXTENDED_RELEASE_TABLET | Freq: Every evening | ORAL | 0 refills | Status: DC | PRN

## 2019-02-27 ENCOUNTER — Other Ambulatory Visit: Payer: Self-pay | Admitting: *Deleted

## 2019-02-27 DIAGNOSIS — C9000 Multiple myeloma not having achieved remission: Secondary | ICD-10-CM

## 2019-02-27 MED ORDER — ACYCLOVIR 800 MG PO TABS: 800.0000 mg | ORAL_TABLET | Freq: Two times a day (BID) | ORAL | 3 refills | Status: DC

## 2019-02-27 NOTE — Progress Notes (Signed)
Faxed refill request for Acyclovir 800 mg. Refill e-scribed.

## 2019-03-16 ENCOUNTER — Other Ambulatory Visit: Payer: Self-pay | Admitting: Oncology

## 2019-03-16 DIAGNOSIS — C9000 Multiple myeloma not having achieved remission: Secondary | ICD-10-CM

## 2019-03-19 IMAGING — DX DG BONE SURVEY MET
9 of 10 series · 9 of 10 positions shown · non-contrast
Comparison: None.

CLINICAL DATA: Monoclonal gammopathy. Left buttock pain 4 weeks
radiating down left lower extremity.

EXAM:
METASTATIC BONE SURVEY

[skull lat]
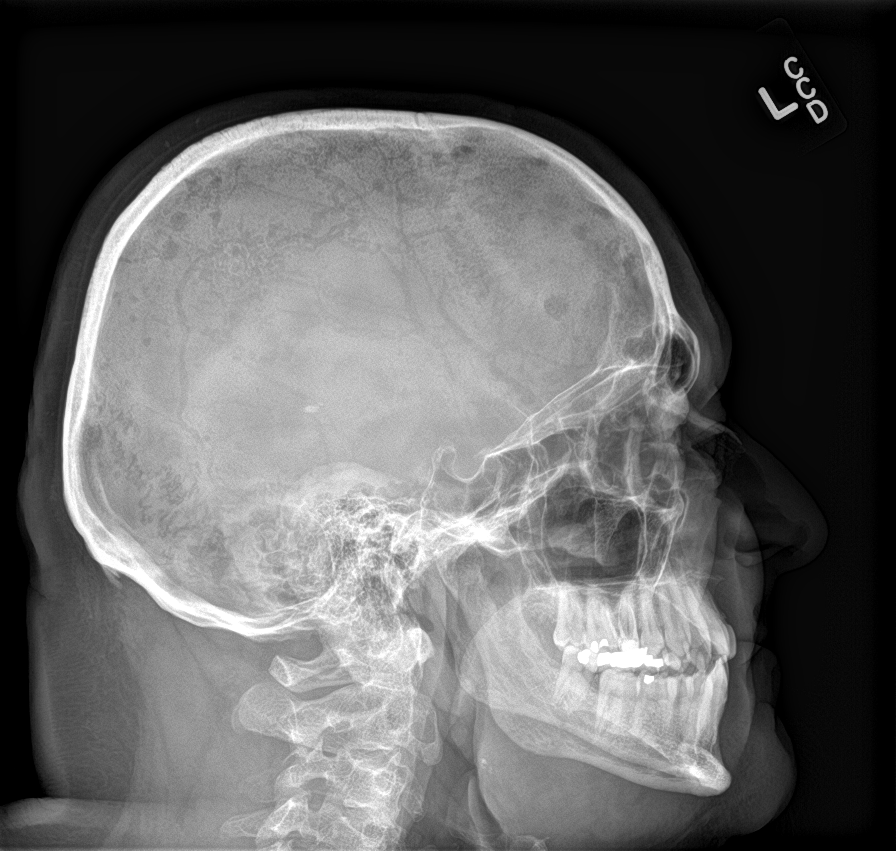

[shoulder ap (1 of 2)]
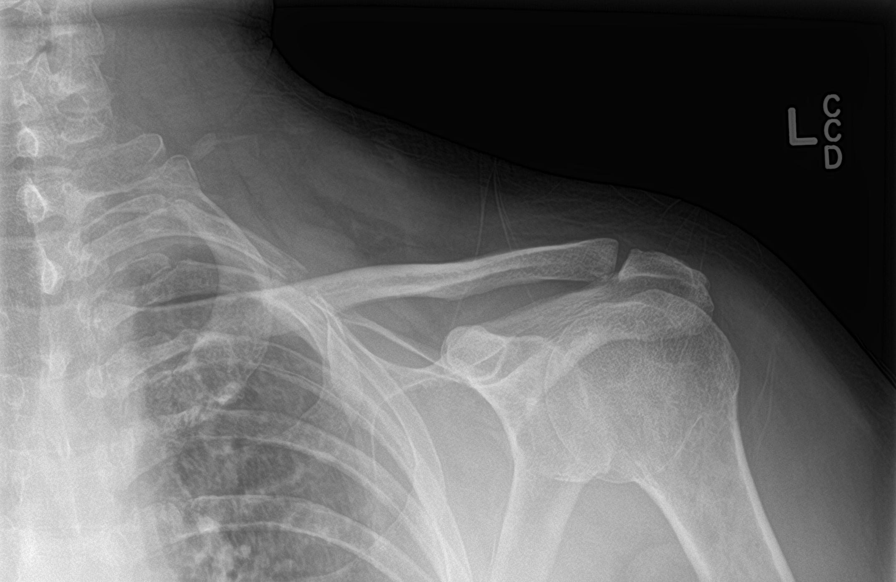

[shoulder ap (2 of 2)]
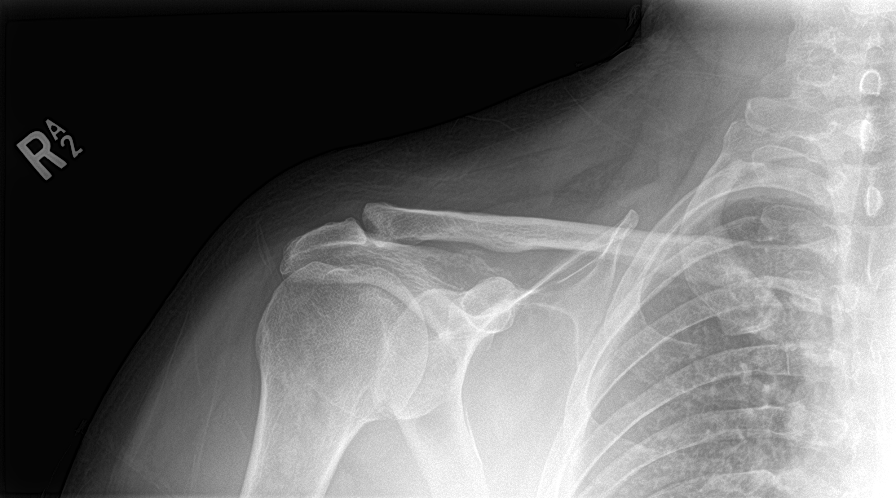

[humerus ap (1 of 2)]
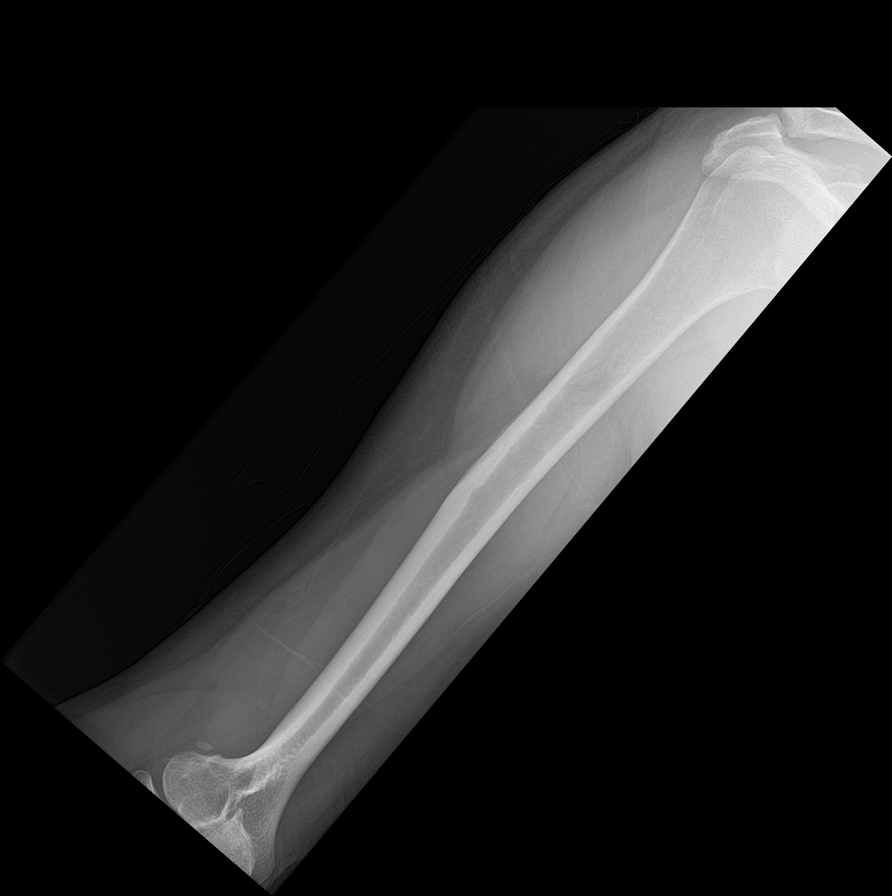

[humerus ap (2 of 2)]
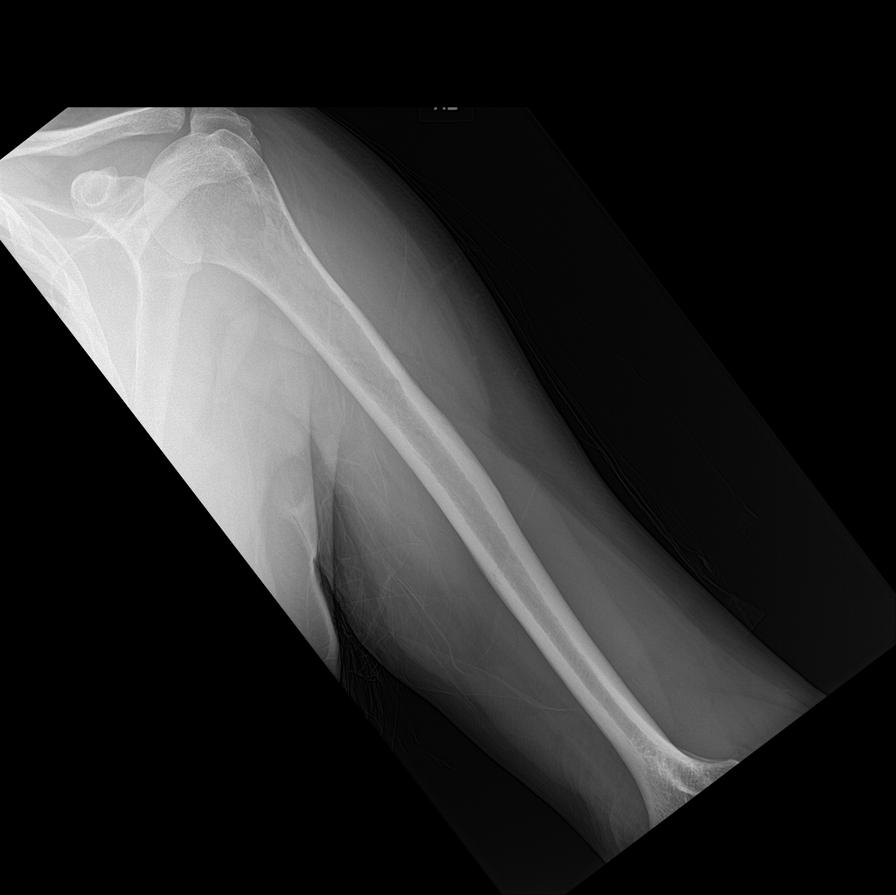

[forearm ap (1 of 2)]
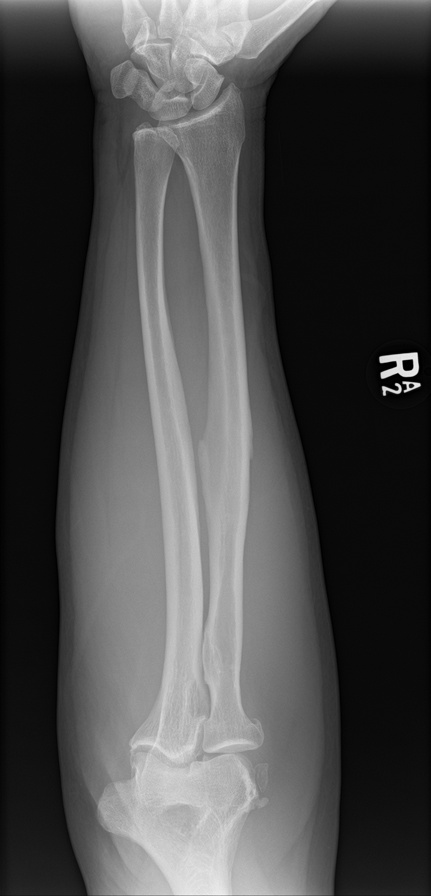

[forearm ap (2 of 2)]
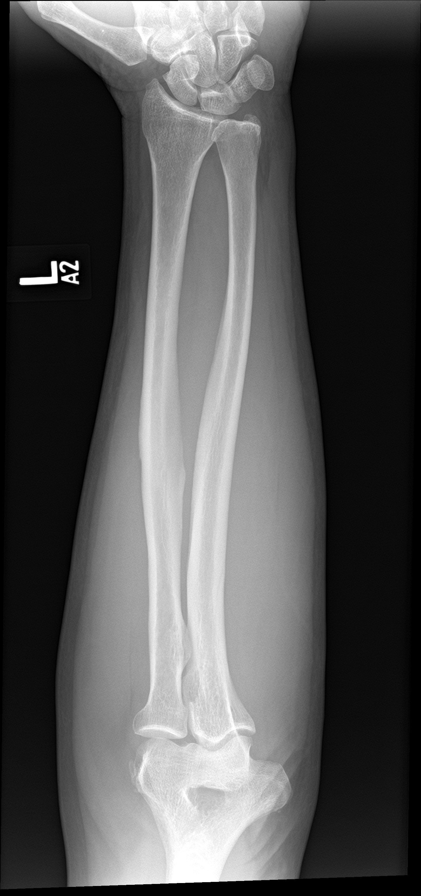

[c-spine ap]
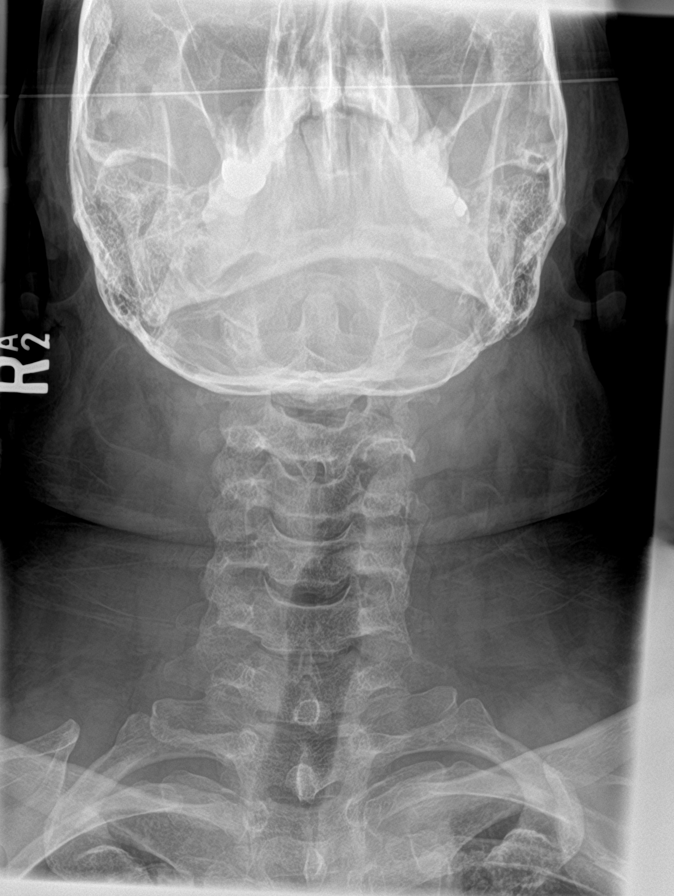

[c-spine lat]
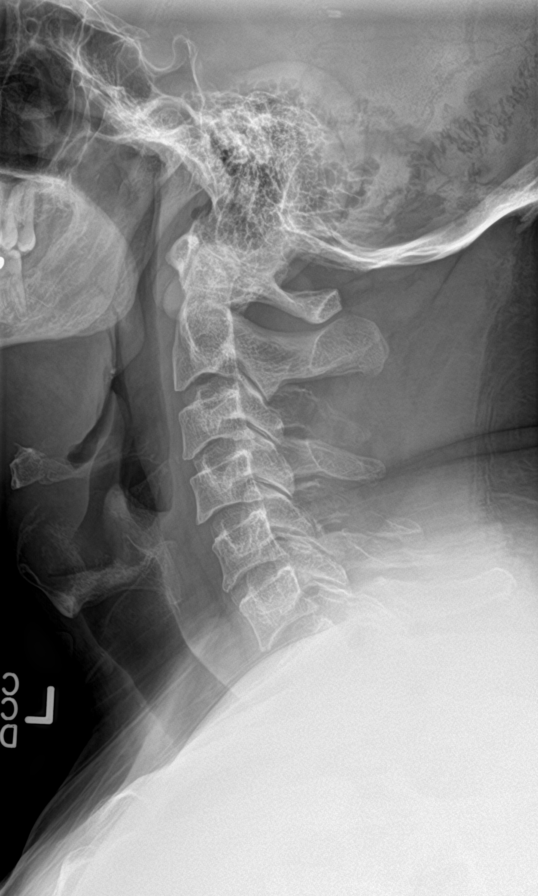

[9 of 10 positions shown; findings below may reference images not displayed]

FINDINGS: Lateral skull film demonstrates multiple round lytic lesions. There
is minimal spondylosis throughout the spine without evidence of
compression fracture. Subtle grade 1 anterolisthesis of L4 on L5 due
to facet arthropathy. There is mild mottled lucent appearance over
the proximal femurs and inferior pubic rami a/a scan bilaterally.
1.2 cm oval lytic lesion over the lateral aspect of the right iliac
bone. Deformity with possible destruction of a short segment of the
posterolateral aspect of the left second rib. Remainder of the exam
is unremarkable.
IMPRESSION: Several lytic lesions over the skull, 1.2 cm lytic lesion over the
lateral aspect right iliac bone, deformity with possible focal
destruction posterolateral aspect left second rib as well as mild
mottled lucent appearance of the proximal femurs and anterior pelvis
as described. These findings may all be explained by patient's
suspected multiple myeloma/monoclonal gammopathy.

## 2019-03-22 ENCOUNTER — Encounter: Payer: Self-pay | Admitting: Nurse Practitioner

## 2019-03-23 ENCOUNTER — Other Ambulatory Visit: Payer: Self-pay | Admitting: Nurse Practitioner

## 2019-03-23 DIAGNOSIS — C9000 Multiple myeloma not having achieved remission: Secondary | ICD-10-CM

## 2019-03-23 MED ORDER — ZOLPIDEM TARTRATE ER 12.5 MG PO TBCR: 12.5000 mg | EXTENDED_RELEASE_TABLET | Freq: Every evening | ORAL | 0 refills | Status: DC | PRN

## 2019-03-24 ENCOUNTER — Encounter: Payer: Self-pay | Admitting: Oncology

## 2019-04-02 ENCOUNTER — Encounter: Payer: Self-pay | Admitting: Nurse Practitioner

## 2019-04-02 ENCOUNTER — Encounter: Payer: Self-pay | Admitting: *Deleted

## 2019-04-06 ENCOUNTER — Ambulatory Visit: Payer: BLUE CROSS/BLUE SHIELD

## 2019-04-11 ENCOUNTER — Encounter: Payer: Self-pay | Admitting: Oncology

## 2019-04-13 NOTE — Telephone Encounter (Signed)
Ok, but should get from primary md in future

## 2019-04-14 ENCOUNTER — Telehealth: Payer: Self-pay | Admitting: *Deleted

## 2019-04-14 NOTE — Telephone Encounter (Signed)
TCT patient regarding refill of Losartan.  Spoke with pt's wife.  He is now getting this filled through his PCP.

## 2019-04-15 ENCOUNTER — Encounter: Payer: Self-pay | Admitting: *Deleted

## 2019-04-20 ENCOUNTER — Other Ambulatory Visit: Payer: BLUE CROSS/BLUE SHIELD

## 2019-04-20 ENCOUNTER — Ambulatory Visit: Payer: BLUE CROSS/BLUE SHIELD | Admitting: Oncology

## 2019-04-23 ENCOUNTER — Encounter: Payer: Self-pay | Admitting: Oncology

## 2019-04-29 ENCOUNTER — Encounter: Payer: Self-pay | Admitting: Nurse Practitioner

## 2019-04-29 ENCOUNTER — Other Ambulatory Visit: Payer: Self-pay | Admitting: *Deleted

## 2019-04-29 DIAGNOSIS — C9000 Multiple myeloma not having achieved remission: Secondary | ICD-10-CM

## 2019-04-29 MED ORDER — ZOLPIDEM TARTRATE ER 12.5 MG PO TBCR: 12.5000 mg | EXTENDED_RELEASE_TABLET | Freq: Every evening | ORAL | 0 refills | Status: DC | PRN

## 2019-05-04 ENCOUNTER — Other Ambulatory Visit: Payer: Self-pay | Admitting: Oncology

## 2019-05-04 DIAGNOSIS — C9000 Multiple myeloma not having achieved remission: Secondary | ICD-10-CM

## 2019-05-06 ENCOUNTER — Encounter: Payer: Self-pay | Admitting: Oncology

## 2019-05-06 ENCOUNTER — Other Ambulatory Visit: Payer: Self-pay | Admitting: Oncology

## 2019-05-06 DIAGNOSIS — C9000 Multiple myeloma not having achieved remission: Secondary | ICD-10-CM

## 2019-05-06 MED ORDER — LENALIDOMIDE 10 MG PO CAPS: ORAL_CAPSULE | ORAL | 0 refills | Status: DC

## 2019-05-22 ENCOUNTER — Other Ambulatory Visit: Payer: Self-pay | Admitting: Oncology

## 2019-05-22 DIAGNOSIS — C9000 Multiple myeloma not having achieved remission: Secondary | ICD-10-CM

## 2019-05-25 ENCOUNTER — Encounter: Payer: Self-pay | Admitting: Nurse Practitioner

## 2019-05-26 ENCOUNTER — Telehealth: Payer: Self-pay | Admitting: Oncology

## 2019-05-26 NOTE — Telephone Encounter (Signed)
R/s appt per sch message - pt is aware of appt date and time

## 2019-05-27 ENCOUNTER — Other Ambulatory Visit: Payer: Self-pay | Admitting: *Deleted

## 2019-05-27 ENCOUNTER — Encounter: Payer: Self-pay | Admitting: Nurse Practitioner

## 2019-05-27 DIAGNOSIS — C9000 Multiple myeloma not having achieved remission: Secondary | ICD-10-CM

## 2019-05-27 MED ORDER — ZOLPIDEM TARTRATE ER 12.5 MG PO TBCR: 12.5000 mg | EXTENDED_RELEASE_TABLET | Freq: Every evening | ORAL | 0 refills | Status: DC | PRN

## 2019-05-28 ENCOUNTER — Encounter: Payer: Self-pay | Admitting: Nurse Practitioner

## 2019-06-01 ENCOUNTER — Encounter: Payer: Self-pay | Admitting: Nurse Practitioner

## 2019-06-04 ENCOUNTER — Other Ambulatory Visit: Payer: BLUE CROSS/BLUE SHIELD

## 2019-06-04 ENCOUNTER — Ambulatory Visit: Payer: BLUE CROSS/BLUE SHIELD | Admitting: Oncology

## 2019-06-04 ENCOUNTER — Ambulatory Visit: Payer: BLUE CROSS/BLUE SHIELD

## 2019-06-08 ENCOUNTER — Other Ambulatory Visit: Payer: Self-pay | Admitting: Oncology

## 2019-06-08 DIAGNOSIS — C9000 Multiple myeloma not having achieved remission: Secondary | ICD-10-CM

## 2019-06-12 ENCOUNTER — Ambulatory Visit: Payer: BLUE CROSS/BLUE SHIELD

## 2019-06-12 ENCOUNTER — Other Ambulatory Visit: Payer: BLUE CROSS/BLUE SHIELD

## 2019-06-12 ENCOUNTER — Encounter: Payer: Self-pay | Admitting: Oncology

## 2019-06-12 ENCOUNTER — Telehealth: Payer: Self-pay | Admitting: *Deleted

## 2019-06-12 ENCOUNTER — Ambulatory Visit: Payer: BLUE CROSS/BLUE SHIELD | Admitting: Oncology

## 2019-06-12 MED ORDER — DIPHENOXYLATE-ATROPINE 2.5-0.025 MG PO TABS: 1.0000 | ORAL_TABLET | Freq: Four times a day (QID) | ORAL | 0 refills | Status: DC | PRN

## 2019-06-12 NOTE — Telephone Encounter (Signed)
Wife had sent MyChart message today and RN called to discuss his symptoms. Has had few episodes as if he would "black out" and reports he did X 2 last Friday. Was in hospital that weekend for dehydration. Having diarrhea stools 5-6/day. He is on his 1 week Revlimid break right now. Per Dr. Benay Spice: Lomotil 1-2 qid prn diarrhea. Rest over the weekend and push fluids. F/U in office Monday as scheduled. Wife understands and agrees to this plan.

## 2019-06-15 ENCOUNTER — Other Ambulatory Visit: Payer: Self-pay

## 2019-06-15 ENCOUNTER — Inpatient Hospital Stay (HOSPITAL_BASED_OUTPATIENT_CLINIC_OR_DEPARTMENT_OTHER): Payer: BC Managed Care – PPO | Admitting: Nurse Practitioner

## 2019-06-15 ENCOUNTER — Inpatient Hospital Stay: Payer: BC Managed Care – PPO

## 2019-06-15 ENCOUNTER — Encounter: Payer: Self-pay | Admitting: Oncology

## 2019-06-15 ENCOUNTER — Encounter: Payer: Self-pay | Admitting: Nurse Practitioner

## 2019-06-15 ENCOUNTER — Inpatient Hospital Stay: Payer: BC Managed Care – PPO | Attending: Nurse Practitioner

## 2019-06-15 VITALS — BP 154/95 | HR 85 | Temp 98.9°F | Resp 18 | Ht 74.0 in | Wt 247.4 lb

## 2019-06-15 DIAGNOSIS — F329 Major depressive disorder, single episode, unspecified: Secondary | ICD-10-CM | POA: Insufficient documentation

## 2019-06-15 DIAGNOSIS — R197 Diarrhea, unspecified: Secondary | ICD-10-CM

## 2019-06-15 DIAGNOSIS — C9000 Multiple myeloma not having achieved remission: Secondary | ICD-10-CM | POA: Diagnosis not present

## 2019-06-15 DIAGNOSIS — C9001 Multiple myeloma in remission: Secondary | ICD-10-CM

## 2019-06-15 DIAGNOSIS — I10 Essential (primary) hypertension: Secondary | ICD-10-CM | POA: Insufficient documentation

## 2019-06-15 LAB — CMP (CANCER CENTER ONLY)
ALT: 33 U/L (ref 0–44)
AST: 17 U/L (ref 15–41)
Albumin: 3.7 g/dL (ref 3.5–5.0)
Alkaline Phosphatase: 60 U/L (ref 38–126)
Anion gap: 11 (ref 5–15)
BUN: 15 mg/dL (ref 6–20)
CO2: 24 mmol/L (ref 22–32)
Calcium: 8.5 mg/dL — ABNORMAL LOW (ref 8.9–10.3)
Chloride: 104 mmol/L (ref 98–111)
Creatinine: 1.28 mg/dL — ABNORMAL HIGH (ref 0.61–1.24)
GFR, Est AFR Am: >60 mL/min (ref >60)
GFR, Estimated: >60 mL/min (ref >60)
Glucose, Bld: 205 mg/dL — ABNORMAL HIGH (ref 70–99)
Potassium: 4 mmol/L (ref 3.5–5.1)
Sodium: 139 mmol/L (ref 135–145)
Total Bilirubin: 0.4 mg/dL (ref 0.3–1.2)
Total Protein: 7 g/dL (ref 6.5–8.1)

## 2019-06-15 LAB — CBC WITH DIFFERENTIAL (CANCER CENTER ONLY)
Abs Immature Granulocytes: 0.03 10*3/uL (ref 0.00–0.07)
Basophils Absolute: 0.1 10*3/uL (ref 0.0–0.1)
Basophils Relative: 1 %
Eosinophils Absolute: 0.1 10*3/uL (ref 0.0–0.5)
Eosinophils Relative: 1 %
HCT: 45.8 % (ref 39.0–52.0)
Hemoglobin: 15.4 g/dL (ref 13.0–17.0)
Immature Granulocytes: 1 %
Lymphocytes Relative: 16 %
Lymphs Abs: 0.8 10*3/uL (ref 0.7–4.0)
MCH: 31.4 pg (ref 26.0–34.0)
MCHC: 33.6 g/dL (ref 30.0–36.0)
MCV: 93.5 fL (ref 80.0–100.0)
Monocytes Absolute: 0.4 10*3/uL (ref 0.1–1.0)
Monocytes Relative: 8 %
Neutro Abs: 3.9 10*3/uL (ref 1.7–7.7)
Neutrophils Relative %: 73 %
Platelet Count: 194 10*3/uL (ref 150–400)
RBC: 4.9 MIL/uL (ref 4.22–5.81)
RDW: 14.1 % (ref 11.5–15.5)
WBC Count: 5.4 10*3/uL (ref 4.0–10.5)
nRBC: 0 % (ref 0.0–0.2)

## 2019-06-15 MED ORDER — ZOLEDRONIC ACID 4 MG/100ML IV SOLN
4.0000 mg | Freq: Once | INTRAVENOUS | Status: AC
  Administered 2019-06-15: 4 mg via INTRAVENOUS
  Filled 2019-06-15: qty 100

## 2019-06-15 MED ORDER — DIPHENOXYLATE-ATROPINE 2.5-0.025 MG PO TABS: 1.0000 | ORAL_TABLET | Freq: Four times a day (QID) | ORAL | 0 refills | Status: DC | PRN

## 2019-06-15 NOTE — Progress Notes (Signed)
Ok to give zometa today with Ca = 8.5 per [name], NP.  Verified pt is taking calcium at home.

## 2019-06-15 NOTE — Patient Instructions (Signed)
Coronavirus (COVID-19) Are you at risk?  Are you at risk for the Coronavirus (COVID-19)?  To be considered HIGH RISK for Coronavirus (COVID-19), you have to meet the following criteria:  . Traveled to [place], [place], [place], [place] or [place]; or in the [place] to [place], [place], [place], or [place]; and have fever, cough, and shortness of breath within the last 2 weeks of travel OR . Been in close contact with a person diagnosed with COVID-19 within the last 2 weeks and have fever, cough, and shortness of breath . IF YOU DO NOT MEET THESE CRITERIA, YOU ARE CONSIDERED LOW RISK FOR COVID-19.  What to do if you are HIGH RISK for COVID-19?  . If you are having a medical emergency, call 911. . Seek medical care right away. Before you go to a doctor's office, urgent care or emergency department, call ahead and tell them about your recent travel, contact with someone diagnosed with COVID-19, and your symptoms. You should receive instructions from your physician's office regarding next steps of care.  . When you arrive at healthcare provider, tell the healthcare staff immediately you have returned from visiting [place], [place], [place], [place] or [place]; or traveled in the [place] to [place], [place], [place], or [place]; in the last two weeks or you have been in close contact with a person diagnosed with COVID-19 in the last 2 weeks.   . Tell the health care staff about your symptoms: fever, cough and shortness of breath. . After you have been seen by a medical provider, you will be either: o Tested for (COVID-19) and discharged home on quarantine except to seek medical care if symptoms worsen, and asked to  - Stay home and avoid contact with others until you get your results (4-5 days)  - Avoid travel on public transportation if possible (such as bus, train, or airplane) or o Sent to the Emergency Department by EMS for evaluation, COVID-19 testing, and possible  admission depending on your condition and test results.  What to do if you are LOW RISK for COVID-19?  Reduce your risk of any infection by using the same precautions used for avoiding the common cold or flu:  . Wash your hands often with soap and warm water for at least 20 seconds.  If soap and water are not readily available, use an alcohol-based hand sanitizer with at least 60% alcohol.  . If coughing or sneezing, cover your mouth and nose by coughing or sneezing into the elbow areas of your shirt or coat, into a tissue or into your sleeve (not your hands). . Avoid shaking hands with others and consider head nods or verbal greetings only. . Avoid touching your eyes, nose, or mouth with unwashed hands.  . Avoid close contact with people who are sick. . Avoid places or events with large numbers of people in one location, like concerts or sporting events. . Carefully consider travel plans you have or are making. . If you are planning any travel outside or inside the [place], visit the CDC's Travelers' Health webpage for the latest health notices. . If you have some symptoms but not all symptoms, continue to monitor at home and seek medical attention if your symptoms worsen. . If you are having a medical emergency, call 911.   ADDITIONAL HEALTHCARE OPTIONS FOR PATIENTS  [facility] / e-Visit: [email or web address]         [facility]: [phone]  [place]   Urgent Care: [facility]: [phone]   Zoledronic Acid injection (Hypercalcemia, Oncology) What is this medicine? ZOLEDRONIC ACID (ZOE le dron ik AS id) lowers the amount of calcium loss from bone. It is used to treat too much calcium in your blood from cancer. It is also used to prevent complications of cancer that has spread to the bone. This medicine may be used for other purposes; ask your health care provider or pharmacist  if you have questions. COMMON BRAND NAME(S): Zometa What should I tell my health care provider before I take this medicine? They need to know if you have any of these conditions:  aspirin-sensitive asthma  cancer, especially if you are receiving medicines used to treat cancer  dental disease or wear dentures  infection  kidney disease  receiving corticosteroids like dexamethasone or prednisone  an unusual or allergic reaction to zoledronic acid, other medicines, foods, dyes, or preservatives  pregnant or trying to get pregnant  breast-feeding How should I use this medicine? This medicine is for infusion into a vein. It is given by a health care professional in a hospital or clinic setting. Talk to your pediatrician regarding the use of this medicine in children. Special care may be needed. Overdosage: If you think you have taken too much of this medicine contact a poison control center or emergency room at once. NOTE: This medicine is only for you. Do not share this medicine with others. What if I miss a dose? It is important not to miss your dose. Call your doctor or health care professional if you are unable to keep an appointment. What may interact with this medicine?  certain antibiotics given by injection  NSAIDs, medicines for pain and inflammation, like ibuprofen or naproxen  some diuretics like bumetanide, furosemide  teriparatide  thalidomide This list may not describe all possible interactions. Give your health care provider a list of all the medicines, herbs, non-prescription drugs, or dietary supplements you use. Also tell them if you smoke, drink alcohol, or use illegal drugs. Some items may interact with your medicine. What should I watch for while using this medicine? Visit your doctor or health care professional for regular checkups. It may be some time before you see the benefit from this medicine. Do not stop taking your medicine unless your doctor tells  you to. Your doctor may order blood tests or other tests to see how you are doing. Women should inform their doctor if they wish to become pregnant or think they might be pregnant. There is a potential for serious side effects to an unborn child. Talk to your health care professional or pharmacist for more information. You should make sure that you get enough calcium and vitamin D while you are taking this medicine. Discuss the foods you eat and the vitamins you take with your health care professional. Some people who take this medicine have severe bone, joint, and/or muscle pain. This medicine may also increase your risk for jaw problems or a broken thigh bone. Tell your doctor right away if you have severe pain in your jaw, bones, joints, or muscles. Tell your doctor if you have any pain that does not go away or that gets worse. Tell your dentist and dental surgeon that you are taking this medicine. You should not have major dental surgery while on this medicine. See your dentist to have a dental  exam and fix any dental problems before starting this medicine. Take good care of your teeth while on this medicine. Make sure you see your dentist for regular follow-up appointments. What side effects may I notice from receiving this medicine? Side effects that you should report to your doctor or health care professional as soon as possible:  allergic reactions like skin rash, itching or hives, swelling of the face, lips, or tongue  anxiety, confusion, or depression  breathing problems  changes in vision  eye pain  feeling faint or lightheaded, falls  jaw pain, especially after dental work  mouth sores  muscle cramps, stiffness, or weakness  redness, blistering, peeling or loosening of the skin, including inside the mouth  trouble passing urine or change in the amount of urine Side effects that usually do not require medical attention (report to your doctor or health care professional if they  continue or are bothersome):  bone, joint, or muscle pain  constipation  diarrhea  fever  hair loss  irritation at site where injected  loss of appetite  nausea, vomiting  stomach upset  trouble sleeping  trouble swallowing  weak or tired This list may not describe all possible side effects. Call your doctor for medical advice about side effects. You may report side effects to FDA at 1-800-FDA-1088. Where should I keep my medicine? This drug is given in a hospital or clinic and will not be stored at home. NOTE: This sheet is a summary. It may not cover all possible information. If you have questions about this medicine, talk to your doctor, pharmacist, or health care provider.  2020 Elsevier/Gold Standard (2014-04-17 14:19:39)

## 2019-06-15 NOTE — Telephone Encounter (Signed)
Scanned images printed.  Placed in Oral Chemotherapy Pharmacy folder for "Prior Authorization" at this time.

## 2019-06-15 NOTE — Progress Notes (Addendum)
[name] OFFICE PROGRESS NOTE   Diagnosis: Multiple myeloma  INTERVAL HISTORY:   [name] returns for follow-up.  He is currently on the one-week break from Revlimid.  He denies nausea/vomiting.  He notes 2-3 bowel movements a day while taking Revlimid.  Stool consistency ranges from loose to soft.  He reports that he "passed out" at home 2 weeks ago.  He has "blacked out" twice at work recently.  He was admitted to a hospital with "extreme dehydration".  The episodes at work occurred while he was working outside.  He does not feel he was eating or drinking enough.  He denies associated symptoms such as shortness of breath and chest pain.  He has been referred to cardiology.  He has had no fever or cough.  He denies bleeding.  No mouth sores.  No gum or tooth pain.  Objective:  Vital signs in last 24 hours:  Blood pressure (!) 154/95, pulse 85, temperature 98.9 F (37.2 C), temperature source Oral, resp. rate 18, height _0  (1.88 m), weight 247 lb 6.4 oz (112.2 kg), SpO2 97 %.    HEENT: No thrush or ulcers.  Mouth somewhat dry appearing. Resp: Lungs clear bilaterally. Cardio: Regular rate and rhythm. GI: Abdomen soft and nontender.  No hepatomegaly. Vascular: No leg edema. Neuro: Alert and oriented. Skin: Normal skin turgor.   Lab Results:  Lab Results  Component Value Date   WBC 5.4 06/15/2019   HGB 15.4 06/15/2019   HCT 45.8 06/15/2019   MCV 93.5 06/15/2019   PLT 194 06/15/2019   NEUTROABS 3.9 06/15/2019    Imaging:  No results found.  Medications: I have reviewed the patient's current medications.  Assessment/Plan: 1.Multiple myeloma-IgG lambda serum monoclonal protein, elevated serum lambda light chains  Bone survey 10/11/2017-lytic lesions noted in the skull, right iliac, left second rib, and mottled appearance of the proximal femurs/pelvis  Bone marrow biopsy 10/14/2017-hypercellular marrow with plasma cell neoplasm, 82% plasma  cells, lambda light chain restricted, hyperdiploid with gains of chromosomes 3, 5, 7, 9, and 11. FISH panel positive for gain of ATM(+11)  Cycle 1 RVD 10/18/2017 (Revlimid started 10/23/2017)  Cycle 2 RVD 11/15/2017  Cycle 3 RVD 12/13/2017  Cycle 4 RVD 01/14/2018  Cycle 5 of RVD 02/14/2018 (Revlimid given for 7 days and 1 week of Velcade), therapy held beginning 02/21/2018 secondary to plan for stem cell therapy  Bone marrow biopsy 02/20/2018, 1-2% plasma cell  PET scan 02/20/2018, no malignant range activity above background, numerous lytic lesions throughout the axial and appendicular skeleton,, left iliac wing fracture  Melphalan, 200 mg/m on 03/24/2018  Stem cell infusion 03/25/2018  Restaging at [facility] 07/09/2018: Normal lambda light chains, no serum M spike, bone marrow biopsy negative for myeloma, less than 1% plasma cells  PET scan at [facility] 07/09/2018- lytic bone lesions, no malignant range activity  Initiation of maintenance Revlimid, 10 mg, 21/28 days 08/15/2018  Cycle 2 maintenance Revlimid 09/12/2018  Cycle 3 maintenance Revlimid 10/10/2018   2.Pain secondary to multiple myeloma involving the spine and pelvis-resolved  MRI of the lumbar spine 10/02/2018-numerous rounded foci in the vertebral bodies, hypertrophy of the L4 transverse process  3.Hypertension-losartan dose increased 11/15/2017  4.Depression    Disposition: Steve Carter appears unchanged.  He is currently on maintenance Revlimid.  We are placing Revlimid on hold due to the recent presyncopal, syncopal episodes and diarrhea.  He will follow-up with PCP/cardiology.    The diarrhea is likely related to Revlimid.  We discussed taking Imodium or Lomotil.  As noted above we are placing Revlimid on hold and will reevaluate in 2 weeks to decide on when to resume and at what dose.  Review of blood work from today shows blood sugar of 205.  He will discuss with his PCP.  Creatinine is mildly  elevated.  He will receive 500 cc of normal saline today.  He will return for lab and follow-up in 2 weeks.  He will contact the office in the interim with any problems.  Patient seen with Dr. Benay Spice.  25 minutes were spent face-to-face at today's visit with the majority of that time involved in counseling/coordination of care.  [name] ANP/GNP-BC   06/15/2019  9:46 AM This was a shared visit with [name].  Steve Carter reports recent syncope and dehydration.  It is unclear whether this is related to dehydration from diarrhea.  We decided to place Revlimid on hold.  He will use Imodium or Lomotil as needed.  He received intravenous fluids and Zometa today.  We encouraged him to follow-up with his primary provider to evaluate the syncope events and elevated blood sugar.  Julieanne Manson, MD

## 2019-06-16 ENCOUNTER — Telehealth: Payer: Self-pay | Admitting: Nurse Practitioner

## 2019-06-16 ENCOUNTER — Telehealth: Payer: Self-pay

## 2019-06-16 NOTE — Telephone Encounter (Signed)
Oral Oncology Patient Advocate Encounter  Received notification from [facility] that prior authorization for Revlimid is required.  PA submitted on CoverMyMeds Key [id number] Status is pending  Oral Oncology Clinic will continue to follow.  [place] Patient [place] Phone [phone] Fax [phone] 06/16/2019    8:55 AM

## 2019-06-16 NOTE — Telephone Encounter (Signed)
Called and spoke with patients wife. Confirmed date and time of appt

## 2019-06-16 NOTE — Telephone Encounter (Addendum)
Oral Oncology Patient Advocate Encounter  Prior Authorization for Revlimid has been approved.    PA# [id number] Effective dates: 06/16/19 through 06/15/20  Oral Oncology Clinic will continue to follow.   I called the patient and spoke to his wife, Steve Carter. I let her know about the prior authorization approval and that I had sent that approval letter to the dispensing pharmacy, [facility].   Tracey verbalized understanding and great appreciation.  [place] Patient [place] Phone [phone] Fax [phone] 06/16/2019    9:29 AM

## 2019-06-17 ENCOUNTER — Other Ambulatory Visit: Payer: BLUE CROSS/BLUE SHIELD

## 2019-06-17 ENCOUNTER — Ambulatory Visit: Payer: BLUE CROSS/BLUE SHIELD

## 2019-06-17 ENCOUNTER — Ambulatory Visit: Payer: BLUE CROSS/BLUE SHIELD | Admitting: Oncology

## 2019-06-25 ENCOUNTER — Encounter: Payer: Self-pay | Admitting: Oncology

## 2019-06-25 ENCOUNTER — Other Ambulatory Visit: Payer: Self-pay | Admitting: *Deleted

## 2019-06-25 DIAGNOSIS — C9000 Multiple myeloma not having achieved remission: Secondary | ICD-10-CM

## 2019-06-25 MED ORDER — ZOLPIDEM TARTRATE ER 12.5 MG PO TBCR: 12.5000 mg | EXTENDED_RELEASE_TABLET | Freq: Every evening | ORAL | 0 refills | Status: DC | PRN

## 2019-06-29 ENCOUNTER — Inpatient Hospital Stay: Payer: BC Managed Care – PPO

## 2019-06-29 ENCOUNTER — Other Ambulatory Visit: Payer: Self-pay

## 2019-06-29 ENCOUNTER — Ambulatory Visit: Payer: BC Managed Care – PPO | Admitting: Physician Assistant

## 2019-06-29 ENCOUNTER — Other Ambulatory Visit: Payer: Self-pay | Admitting: *Deleted

## 2019-06-29 ENCOUNTER — Inpatient Hospital Stay (HOSPITAL_BASED_OUTPATIENT_CLINIC_OR_DEPARTMENT_OTHER): Payer: BC Managed Care – PPO | Admitting: Nurse Practitioner

## 2019-06-29 ENCOUNTER — Encounter: Payer: Self-pay | Admitting: Nurse Practitioner

## 2019-06-29 VITALS — BP 126/93 | HR 80 | Temp 98.0°F | Resp 18 | Ht 74.0 in | Wt 245.4 lb

## 2019-06-29 DIAGNOSIS — C9000 Multiple myeloma not having achieved remission: Secondary | ICD-10-CM | POA: Diagnosis not present

## 2019-06-29 DIAGNOSIS — C9001 Multiple myeloma in remission: Secondary | ICD-10-CM

## 2019-06-29 LAB — CBC WITH DIFFERENTIAL (CANCER CENTER ONLY)
Abs Immature Granulocytes: 0.05 10*3/uL (ref 0.00–0.07)
Basophils Absolute: 0.1 10*3/uL (ref 0.0–0.1)
Basophils Relative: 1 %
Eosinophils Absolute: 0.1 10*3/uL (ref 0.0–0.5)
Eosinophils Relative: 1 %
HCT: 46.3 % (ref 39.0–52.0)
Hemoglobin: 15.7 g/dL (ref 13.0–17.0)
Immature Granulocytes: 1 %
Lymphocytes Relative: 13 %
Lymphs Abs: 0.9 10*3/uL (ref 0.7–4.0)
MCH: 31.7 pg (ref 26.0–34.0)
MCHC: 33.9 g/dL (ref 30.0–36.0)
MCV: 93.3 fL (ref 80.0–100.0)
Monocytes Absolute: 0.5 10*3/uL (ref 0.1–1.0)
Monocytes Relative: 6 %
Neutro Abs: 5.6 10*3/uL (ref 1.7–7.7)
Neutrophils Relative %: 78 %
Platelet Count: 189 10*3/uL (ref 150–400)
RBC: 4.96 MIL/uL (ref 4.22–5.81)
RDW: 14 % (ref 11.5–15.5)
WBC Count: 7.1 10*3/uL (ref 4.0–10.5)
nRBC: 0 % (ref 0.0–0.2)

## 2019-06-29 LAB — CMP (CANCER CENTER ONLY)
ALT: 33 U/L (ref 0–44)
AST: 17 U/L (ref 15–41)
Albumin: 3.9 g/dL (ref 3.5–5.0)
Alkaline Phosphatase: 60 U/L (ref 38–126)
Anion gap: 9 (ref 5–15)
BUN: 15 mg/dL (ref 6–20)
CO2: 28 mmol/L (ref 22–32)
Calcium: 9.2 mg/dL (ref 8.9–10.3)
Chloride: 104 mmol/L (ref 98–111)
Creatinine: 1.28 mg/dL — ABNORMAL HIGH (ref 0.61–1.24)
GFR, Est AFR Am: >60 mL/min (ref >60)
GFR, Estimated: >60 mL/min (ref >60)
Glucose, Bld: 130 mg/dL — ABNORMAL HIGH (ref 70–99)
Potassium: 4 mmol/L (ref 3.5–5.1)
Sodium: 141 mmol/L (ref 135–145)
Total Bilirubin: 0.6 mg/dL (ref 0.3–1.2)
Total Protein: 7.3 g/dL (ref 6.5–8.1)

## 2019-06-29 MED ORDER — LENALIDOMIDE 5 MG PO CAPS: 5.0000 mg | ORAL_CAPSULE | Freq: Every day | ORAL | 0 refills | Status: DC

## 2019-06-29 NOTE — Progress Notes (Signed)
[facility] OFFICE PROGRESS NOTE   Diagnosis: Multiple myeloma  INTERVAL HISTORY:   Steve Carter returns as scheduled.  We placed Revlimid on hold following his office visit 06/15/2019 due to recent presyncopal/syncopal episodes and diarrhea.  He reports no significant diarrhea since Revlimid was placed on hold.  He has had maybe 1 or 2 loose stools.  No lightheadedness or dizziness.  No syncopal episodes.  He denies chest pain.  No shortness of breath.  No fever or cough.  Objective:  Vital signs in last 24 hours:  Blood pressure (!) 126/93, pulse 80, temperature 98 F (36.7 C), temperature source Oral, resp. rate 18, height '6\' 2"'$  (1.88 m), weight 245 lb 6.4 oz (111.3 kg), SpO2 98 %.   Limited physical examination due to COVID-19 distancing. HEENT: Mild white coating over tongue.  No ulcers. GI: Abdomen soft and nontender.  No hepatomegaly. Vascular: No leg edema. Neuro: Alert and oriented.    Lab Results:  Lab Results  Component Value Date   WBC 5.4 06/15/2019   HGB 15.4 06/15/2019   HCT 45.8 06/15/2019   MCV 93.5 06/15/2019   PLT 194 06/15/2019   NEUTROABS 3.9 06/15/2019    Imaging:  No results found.  Medications: I have reviewed the patient's current medications.  Assessment/Plan: 1.Multiple myeloma-IgG lambda serum monoclonal protein, elevated serum lambda light chains  Bone survey 10/11/2017-lytic lesions noted in the skull, right iliac, left second rib, and mottled appearance of the proximal femurs/pelvis  Bone marrow biopsy 10/14/2017-hypercellular marrow with plasma cell neoplasm, 82% plasma cells, lambda light chain restricted, hyperdiploid with gains of chromosomes 3, 5, 7, 9, and 11. FISH panel positive for gain of ATM(+11)  Cycle 1 RVD 10/18/2017 (Revlimid started 10/23/2017)  Cycle 2 RVD 11/15/2017  Cycle 3 RVD 12/13/2017  Cycle 4 RVD 01/14/2018  Cycle 5 of RVD 02/14/2018 (Revlimid given for 7 days and 1 week of Velcade),  therapy held beginning 02/21/2018 secondary to plan for stem cell therapy  Bone marrow biopsy 02/20/2018, 1-2% plasma cell  PET scan 02/20/2018, no malignant range activity above background, numerous lytic lesions throughout the axial and appendicular skeleton,, left iliac wing fracture  Melphalan, 200 mg/m on 03/24/2018  Stem cell infusion 03/25/2018  Restaging at [facility] 07/09/2018: Normal lambda light chains, no serum M spike, bone marrow biopsy negative for myeloma, less than 1% plasma cells  PET scan at [facility] 07/09/2018-lytic bone lesions, no malignant range activity  Initiation of maintenance Revlimid, 10 mg, 21/28 days 08/15/2018  Cycle 2 maintenance Revlimid 09/12/2018  Cycle 3 maintenance Revlimid 10/10/2018  Revlimid placed on hold 06/15/2019 due to presyncopal/syncopal episodes and diarrhea  Revlimid resumed 5 mg 21 days on/7 days off following office visit 06/29/2019   2.Pain secondary to multiple myeloma involving the spine and pelvis-resolved  MRI of the lumbar spine 10/02/2018-numerous rounded foci in the vertebral bodies, hypertrophy of the L4 transverse process  3.Hypertension-losartan dose increased 11/15/2017  4.Depression    Disposition: Steve Carter appears stable.  Revlimid has been on hold since 06/15/2019 due to several presyncopal/syncopal episodes and diarrhea.  He has had no significant diarrhea since Revlimid was held and has had no further presyncopal/syncopal episodes.  Plan to resume Revlimid at a dose of 5 mg daily 21 days on/7 days off.  He will return for lab and follow-up in 1 month.  He will contact the office in the interim with any problems.  Plan reviewed with Dr. Benay Spice.    [name] ANP/GNP-BC  06/29/2019  10:45 AM

## 2019-06-30 ENCOUNTER — Other Ambulatory Visit: Payer: BC Managed Care – PPO

## 2019-06-30 ENCOUNTER — Telehealth: Payer: Self-pay | Admitting: Oncology

## 2019-06-30 ENCOUNTER — Ambulatory Visit: Payer: BC Managed Care – PPO | Admitting: Oncology

## 2019-06-30 LAB — PROTEIN ELECTROPHORESIS, SERUM
A/G Ratio: 1.3 (ref 0.7–1.7)
Albumin ELP: 3.8 g/dL (ref 2.9–4.4)
Alpha-1-Globulin: 0.2 g/dL (ref 0.0–0.4)
Alpha-2-Globulin: 0.8 g/dL (ref 0.4–1.0)
Beta Globulin: 1 g/dL (ref 0.7–1.3)
Gamma Globulin: 0.9 g/dL (ref 0.4–1.8)
Globulin, Total: 2.9 g/dL (ref 2.2–3.9)
M-Spike, %: 0.2 g/dL — ABNORMAL HIGH
Total Protein ELP: 6.7 g/dL (ref 6.0–8.5)

## 2019-06-30 LAB — KAPPA/LAMBDA LIGHT CHAINS
Kappa free light chain: 20.3 mg/L — ABNORMAL HIGH (ref 3.3–19.4)
Kappa, lambda light chain ratio: 0.88 (ref 0.26–1.65)
Lambda free light chains: 23.1 mg/L (ref 5.7–26.3)

## 2019-06-30 LAB — IGG: IgG (Immunoglobin G), Serum: 984 mg/dL (ref 603–1613)

## 2019-06-30 NOTE — Telephone Encounter (Signed)
Called and spoke with patients wife. Confirmed date and time of appt

## 2019-07-01 ENCOUNTER — Encounter: Payer: Self-pay | Admitting: Nurse Practitioner

## 2019-07-01 ENCOUNTER — Other Ambulatory Visit: Payer: Self-pay

## 2019-07-01 MED ORDER — LENALIDOMIDE 5 MG PO CAPS: 5.0000 mg | ORAL_CAPSULE | Freq: Every day | ORAL | 0 refills | Status: DC

## 2019-07-02 ENCOUNTER — Other Ambulatory Visit: Payer: Self-pay | Admitting: Nurse Practitioner

## 2019-07-02 ENCOUNTER — Encounter: Payer: Self-pay | Admitting: Nurse Practitioner

## 2019-07-02 ENCOUNTER — Telehealth: Payer: Self-pay

## 2019-07-02 DIAGNOSIS — C9000 Multiple myeloma not having achieved remission: Secondary | ICD-10-CM

## 2019-07-02 NOTE — Telephone Encounter (Signed)
Spoke with pt's spouse, Olivia Mackie, by phone regarding her MyChart msg. Explained per [name], NP / Dr Benay Spice pt will need to f/u with his PCP regarding symptoms he is having d/t his multiple myeloma medication is currently on hold.   Tracey verbalizes understanding.

## 2019-07-06 ENCOUNTER — Encounter: Payer: Self-pay | Admitting: Nurse Practitioner

## 2019-07-07 ENCOUNTER — Telehealth: Payer: Self-pay | Admitting: Nurse Practitioner

## 2019-07-07 ENCOUNTER — Telehealth: Payer: Self-pay

## 2019-07-07 NOTE — Telephone Encounter (Signed)
I returned Ms. Steve Carter's phone call to discuss the recent lab values.  We are planning to repeat the myeloma panel when he returns 07/27/2019.  For now he should continue Revlimid.

## 2019-07-07 NOTE — Telephone Encounter (Signed)
Returned TC to patients wife [phone]. She wanted to talk to [facility] or Dr Benay Spice about M- spike% being 0.2 because she was concerned about it being high. Lattie Haw aware of concern and will give her a call back.

## 2019-07-13 ENCOUNTER — Ambulatory Visit (INDEPENDENT_AMBULATORY_CARE_PROVIDER_SITE_OTHER): Payer: BC Managed Care – PPO | Admitting: Adult Health

## 2019-07-13 ENCOUNTER — Other Ambulatory Visit: Payer: Self-pay

## 2019-07-13 ENCOUNTER — Encounter: Payer: Self-pay | Admitting: Nurse Practitioner

## 2019-07-13 DIAGNOSIS — F331 Major depressive disorder, recurrent, moderate: Secondary | ICD-10-CM

## 2019-07-13 DIAGNOSIS — F411 Generalized anxiety disorder: Secondary | ICD-10-CM

## 2019-07-13 DIAGNOSIS — G4700 Insomnia, unspecified: Secondary | ICD-10-CM | POA: Diagnosis not present

## 2019-07-14 ENCOUNTER — Inpatient Hospital Stay: Payer: BC Managed Care – PPO

## 2019-07-14 ENCOUNTER — Encounter: Payer: Self-pay | Admitting: Nurse Practitioner

## 2019-07-14 ENCOUNTER — Inpatient Hospital Stay: Payer: BC Managed Care – PPO | Attending: Nurse Practitioner | Admitting: Oncology

## 2019-07-14 ENCOUNTER — Other Ambulatory Visit: Payer: Self-pay | Admitting: Nurse Practitioner

## 2019-07-14 ENCOUNTER — Other Ambulatory Visit: Payer: Self-pay

## 2019-07-14 VITALS — BP 131/88 | HR 80 | Temp 98.0°F | Resp 19 | Ht 74.0 in | Wt 242.4 lb

## 2019-07-14 DIAGNOSIS — R55 Syncope and collapse: Secondary | ICD-10-CM | POA: Insufficient documentation

## 2019-07-14 DIAGNOSIS — G893 Neoplasm related pain (acute) (chronic): Secondary | ICD-10-CM | POA: Insufficient documentation

## 2019-07-14 DIAGNOSIS — Z79899 Other long term (current) drug therapy: Secondary | ICD-10-CM | POA: Diagnosis not present

## 2019-07-14 DIAGNOSIS — R5381 Other malaise: Secondary | ICD-10-CM | POA: Insufficient documentation

## 2019-07-14 DIAGNOSIS — R197 Diarrhea, unspecified: Secondary | ICD-10-CM | POA: Diagnosis not present

## 2019-07-14 DIAGNOSIS — Z23 Encounter for immunization: Secondary | ICD-10-CM | POA: Diagnosis not present

## 2019-07-14 DIAGNOSIS — C9000 Multiple myeloma not having achieved remission: Secondary | ICD-10-CM | POA: Diagnosis not present

## 2019-07-14 DIAGNOSIS — R4182 Altered mental status, unspecified: Secondary | ICD-10-CM | POA: Diagnosis not present

## 2019-07-14 DIAGNOSIS — I10 Essential (primary) hypertension: Secondary | ICD-10-CM | POA: Diagnosis not present

## 2019-07-14 DIAGNOSIS — F329 Major depressive disorder, single episode, unspecified: Secondary | ICD-10-CM | POA: Insufficient documentation

## 2019-07-14 LAB — CMP (CANCER CENTER ONLY)
ALT: 29 U/L (ref 0–44)
AST: 19 U/L (ref 15–41)
Albumin: 3.9 g/dL (ref 3.5–5.0)
Alkaline Phosphatase: 56 U/L (ref 38–126)
Anion gap: 11 (ref 5–15)
BUN: 15 mg/dL (ref 6–20)
CO2: 24 mmol/L (ref 22–32)
Calcium: 8.8 mg/dL — ABNORMAL LOW (ref 8.9–10.3)
Chloride: 105 mmol/L (ref 98–111)
Creatinine: 1.27 mg/dL — ABNORMAL HIGH (ref 0.61–1.24)
GFR, Est AFR Am: >60 mL/min (ref >60)
GFR, Estimated: >60 mL/min (ref >60)
Glucose, Bld: 115 mg/dL — ABNORMAL HIGH (ref 70–99)
Potassium: 3.5 mmol/L (ref 3.5–5.1)
Sodium: 140 mmol/L (ref 135–145)
Total Bilirubin: 0.3 mg/dL (ref 0.3–1.2)
Total Protein: 7 g/dL (ref 6.5–8.1)

## 2019-07-14 LAB — CBC WITH DIFFERENTIAL (CANCER CENTER ONLY)
Abs Immature Granulocytes: 0.14 10*3/uL — ABNORMAL HIGH (ref 0.00–0.07)
Basophils Absolute: 0.1 10*3/uL (ref 0.0–0.1)
Basophils Relative: 1 %
Eosinophils Absolute: 0.2 10*3/uL (ref 0.0–0.5)
Eosinophils Relative: 2 %
HCT: 43.2 % (ref 39.0–52.0)
Hemoglobin: 15 g/dL (ref 13.0–17.0)
Immature Granulocytes: 2 %
Lymphocytes Relative: 12 %
Lymphs Abs: 1.1 10*3/uL (ref 0.7–4.0)
MCH: 32.1 pg (ref 26.0–34.0)
MCHC: 34.7 g/dL (ref 30.0–36.0)
MCV: 92.3 fL (ref 80.0–100.0)
Monocytes Absolute: 0.8 10*3/uL (ref 0.1–1.0)
Monocytes Relative: 8 %
Neutro Abs: 6.9 10*3/uL (ref 1.7–7.7)
Neutrophils Relative %: 75 %
Platelet Count: 184 10*3/uL (ref 150–400)
RBC: 4.68 MIL/uL (ref 4.22–5.81)
RDW: 13.8 % (ref 11.5–15.5)
WBC Count: 9.1 10*3/uL (ref 4.0–10.5)
nRBC: 0 % (ref 0.0–0.2)

## 2019-07-14 MED ORDER — BUPROPION HCL ER (XL) 150 MG PO TB24: ORAL_TABLET | ORAL | 2 refills | Status: DC

## 2019-07-14 NOTE — Progress Notes (Signed)
[facility] MD/PA/NP Initial Note  07/14/2019 9:25 AM Steve Carter  MRN:  [id number]  Chief Complaint:  Depression, anxiety, and irritability  HPI:  Describes mood today as "not good at all". Pleasant. Mood symptoms - reports depression, anxiety, and irritability. Stating "my mood has been bad for a while, at least 6 months". Also stating "I have no desire to do anything". Worries about getting things done - work. Has a difficult time talking or saying things at work. Decreased interest and motivation. Taking current medications as prescribed, but does not feel they are working well. Energy levels low. Active, but does not have a regular exercise routine.  Enjoys some usual interests and activities. Spending time with family wife - and 2 children. Typically likes to hunt, fish, and go kayaking.  Appetite decreased - "it's not what it should be". Weight loss 20 to 30 pounds. Sleeps better some nights than others. Averages 8 hours, but is up and down during the night.  Focus and concentration "terrible". Completing tasks. Managing aspects of household. Work issues. Has a "hard time making decisions".  Denies SI or HI.  Denies AH or VH.  Visit Diagnosis: No diagnosis found.  Past Psychiatric History:   Past Medical History:  Past Medical History:  Diagnosis Date  . Anxiety   . Depression   . GERD (gastroesophageal reflux disease)   . Hypertension   . Sleep apnea    uses C-Pap    Past Surgical History:  Procedure Laterality Date  . CHOLECYSTECTOMY      Family Psychiatric History: Denies  Family History: No family history on file.  Social History:  Social History   Socioeconomic History  . Marital status: Married    Spouse name: Not on file  . Number of children: Not on file  . Years of education: Not on file  . Highest education level: Not on file  Occupational History  . Not on file  Social Needs  . Financial resource strain: Not on file  . Food insecurity   Worry: Not on file    Inability: Not on file  . Transportation needs    Medical: Not on file    Non-medical: Not on file  Tobacco Use  . Smoking status: Never Smoker  . Smokeless tobacco: Current User    Types: Snuff  Substance and Sexual Activity  . Alcohol use: Yes    Alcohol/week: 1.0 standard drinks    Types: 1 Cans of beer per week  . Drug use: No  . Sexual activity: Not on file  Lifestyle  . Physical activity    Days per week: Not on file    Minutes per session: Not on file  . Stress: Not on file  Relationships  . Social Herbalist on phone: Not on file    Gets together: Not on file    Attends religious service: Not on file    Active member of club or organization: Not on file    Attends meetings of clubs or organizations: Not on file    Relationship status: Not on file  Other Topics Concern  . Not on file  Social History Narrative  . Not on file    Allergies: No Known Allergies  Metabolic Disorder Labs: No results found for: HGBA1C, MPG No results found for: PROLACTIN No results found for: CHOL, TRIG, HDL, CHOLHDL, VLDL, LDLCALC No results found for: TSH  Therapeutic Level Labs: No results found for: LITHIUM No results found for: VALPROATE No  components found for:  CBMZ  Current Medications: Current Outpatient Medications  Medication Sig Dispense Refill  . acyclovir (ZOVIRAX) 800 MG tablet TAKE 1 TABLET (800 MG TOTAL) BY MOUTH 2 (TWO) TIMES DAILY. 180 tablet 1  . ARIPiprazole (ABILIFY) 5 MG tablet Take 5 mg daily by mouth.    Marland Kitchen aspirin EC 81 MG tablet Take 1 tablet (81 mg total) daily by mouth.    . Calcium Carbonate-Vitamin D 600-400 MG-UNIT chew tablet Chew 1 tablet by mouth 2 (two) times daily.    . diphenoxylate-atropine (LOMOTIL) 2.5-0.025 MG tablet Take 1-2 tablets by mouth 4 (four) times daily as needed for diarrhea or loose stools (Maximum of 8 tabs/day). 60 tablet 0  . escitalopram (LEXAPRO) 20 MG tablet Take 20 mg daily by mouth.    .  lenalidomide (REVLIMID) 5 MG capsule Take 1 capsule (5 mg total) by mouth daily for 21 days. 7 days off. Adult Male:[employer] Auth # [id number]     Date Obtained 06/29/2019 21 capsule 0  . losartan (COZAAR) 100 MG tablet TAKE 1 TABLET BY MOUTH EVERY DAY 30 tablet 0  . metFORMIN (GLUCOPHAGE) 500 MG tablet Take 500 mg by mouth 2 (two) times daily.  3  . Multiple Vitamin (MULTI-VITAMINS) TABS Take by mouth.    . Omeprazole Magnesium (PRILOSEC OTC PO) Take 20 mg daily by mouth.    . testosterone cypionate (DEPOTESTOSTERONE CYPIONATE) 200 MG/ML injection INJECT 1ML INTRAMUSCULARLY EVERY 14 DAYS  3  . testosterone cypionate (DEPOTESTOTERONE CYPIONATE) 100 MG/ML injection Inject 200 mg every 14 (fourteen) days into the muscle. For IM use only    . zolpidem (AMBIEN CR) 12.5 MG CR tablet Take 1 tablet (12.5 mg total) by mouth at bedtime as needed for sleep. 30 tablet 0   No current facility-administered medications for this visit.     Medication Side Effects: none  Orders placed this visit:  No orders of the defined types were placed in this encounter.   Psychiatric Specialty Exam:  ROS  There were no vitals taken for this visit.There is no height or weight on file to calculate BMI.  General Appearance: Neat and Well Groomed  Eye Contact:  Good  Speech:  Clear and Coherent  Volume:  Normal  Mood:  Depressed  Affect:  Congruent  Thought Process:  Coherent  Orientation:  Full (Time, Place, and Person)  Thought Content: Logical   Suicidal Thoughts:  No  Homicidal Thoughts:  No  Memory:  WNL  Judgement:  Good  Insight:  Fair  Psychomotor Activity:  Normal  Concentration:  Concentration: Fair  Recall:  [place] of Knowledge: Good  Language: Good  Assets:  Communication Skills Desire for Improvement Financial Resources/Insurance Housing Intimacy Leisure Time Physical Health Resilience Social Support Talents/Skills Transportation Vocational/Educational  ADL's:  Intact  Cognition: WNL   Prognosis:  Good   Screenings: none  Receiving Psychotherapy: No   Treatment Plan/Recommendations:   Plan:  1. Add Wellbutrin XL 150mg  daily x 7 days, then 300mg  daily. 2. Continue Abilify 5mg  tab daily 3. Continue Lexapro 20mg  daily 4. Continue Ambien CR 12.5mg  daily  RTC 4 weeks  Patient advised to contact office with any questions, adverse effects, or acute worsening in signs and symptoms.  Discussed potential metabolic side effects associated with atypical antipsychotics, as well as potential risk for movement side effects. Advised pt to contact office if movement side effects occur.   Time spent 60 minutes  Aloha Gell, NP

## 2019-07-14 NOTE — Progress Notes (Signed)
Steve Carter   Diagnosis: Multiple myeloma  INTERVAL HISTORY:   Steve Carter returns prior to scheduled visit.  He continues Revlimid.  He has intermittent diarrhea.  He reports a good appetite.  No fever.  No dyspnea.  No recurrent syncope events.  He and his wife report he has been evaluated by cardiology with an echocardiogram and EKG.  He is being scheduled for a stress test. Steve Carter complains of difficulty concentrating and malaise.  He has stopped working.  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  He stays at home while not at work and has lost interest in getting out of the home.  Objective:  Vital signs in last 24 hours:  Blood pressure 131/88, pulse 80, temperature 98 F (36.7 C), temperature source Oral, resp. rate 19, height '6\' 2"'$  (1.88 m), weight 242 lb 6.4 oz (110 kg), SpO2 99 %.     Resp: Lungs clear bilaterally Cardio: Regular rate and rhythm GI: Nontender, no hepatosplenomegaly Vascular: No leg edema Neuro: Alert, the motor exam appears intact in the upper and lower extremities bilaterally, he had difficulty naming the day of the week and date, oriented to place and diagnosis    Lab Results:  Lab Results  Component Value Date   WBC 9.1 07/14/2019   HGB 15.0 07/14/2019   HCT 43.2 07/14/2019   MCV 92.3 07/14/2019   PLT 184 07/14/2019   NEUTROABS 6.9 07/14/2019    CMP  Lab Results  Component Value Date   NA 140 07/14/2019   K 3.5 07/14/2019   CL 105 07/14/2019   CO2 24 07/14/2019   GLUCOSE 115 (H) 07/14/2019   BUN 15 07/14/2019   CREATININE 1.27 (H) 07/14/2019   CALCIUM 8.8 (L) 07/14/2019   PROT 7.0 07/14/2019   ALBUMIN 3.9 07/14/2019   AST 19 07/14/2019   ALT 29 07/14/2019   ALKPHOS 56 07/14/2019   BILITOT 0.3 07/14/2019   GFRNONAA >60 07/14/2019   GFRAA >60 07/14/2019    Medications: I have reviewed the patient's current medications.   Assessment/Plan:  1.  Multiple myeloma-IgG lambda serum monoclonal protein, elevated serum lambda light chains  Bone survey 10/11/2017-lytic lesions noted in the skull, right iliac, left second rib, and mottled appearance of the proximal femurs/pelvis  Bone marrow biopsy 10/14/2017-hypercellular marrow with plasma cell neoplasm, 82% plasma cells, lambda light chain restricted, hyperdiploid with gains of chromosomes 3, 5, 7, 9, and 11. FISH panel positive for gain of ATM(+11)  Cycle 1 RVD 10/18/2017 (Revlimid started 10/23/2017)  Cycle 2 RVD 11/15/2017  Cycle 3 RVD 12/13/2017  Cycle 4 RVD 01/14/2018  Cycle 5 of RVD 02/14/2018 (Revlimid given for 7 days and 1 week of Velcade), therapy held beginning 02/21/2018 secondary to plan for stem cell therapy  Bone marrow biopsy 02/20/2018, 1-2% plasma cell  PET scan 02/20/2018, no malignant range activity above background, numerous lytic lesions throughout the axial and appendicular skeleton,, left iliac wing fracture  Melphalan, 200 mg/m on 03/24/2018  Stem cell infusion 03/25/2018  Restaging at [facility] 07/09/2018: Normal lambda light chains, no serum M spike, bone marrow biopsy negative for myeloma, less than 1% plasma cells  PET scan at [facility] 07/09/2018-lytic bone lesions, no malignant range activity  Initiation of maintenance Revlimid, 10 mg, 21/28 days 08/15/2018  Cycle 2 maintenance Revlimid 09/12/2018  Cycle 3 maintenance Revlimid 10/10/2018  Revlimid placed on hold 06/15/2019 due to presyncopal/syncopal episodes and diarrhea  Revlimid resumed  5 mg 21 days on/7 days off following office visit 06/29/2019  Regimen placed on hold 10/2019   2.Pain secondary to multiple myeloma involving the spine and pelvis-resolved  MRI of the lumbar spine 10/02/2018-numerous rounded foci in the vertebral bodies, hypertrophy of the L4 transverse process  3.Hypertension-losartan dose increased 11/15/2017  4.Depression  5.  Altered mental  status   Disposition: Steve Carter is in clinical remission from multiple myeloma.  There was a low level serum M spike when he was here on 06/29/2019.  We will repeat a myeloma panel on 07/27/2019.  I suspect his neurologic symptoms are largely related to depression.  He could have altered mental status related to treatment.  I will refer him to Dr. Jonell Cluck at [facility] for psychiatric evaluation and to consider neurocognitive testing.  Steve Carter will place Revlimid on hold.  He will return for an office visit on 07/27/2019.  His wife was present for today's visit by telephone.  Betsy Coder, MD  07/14/2019  3:51 PM

## 2019-07-15 ENCOUNTER — Encounter: Payer: Self-pay | Admitting: Nurse Practitioner

## 2019-07-20 ENCOUNTER — Encounter: Payer: Self-pay | Admitting: Nurse Practitioner

## 2019-07-21 ENCOUNTER — Telehealth: Payer: Self-pay | Admitting: Oncology

## 2019-07-21 ENCOUNTER — Other Ambulatory Visit: Payer: Self-pay | Admitting: *Deleted

## 2019-07-21 ENCOUNTER — Encounter: Payer: Self-pay | Admitting: Nurse Practitioner

## 2019-07-21 DIAGNOSIS — C9000 Multiple myeloma not having achieved remission: Secondary | ICD-10-CM

## 2019-07-21 MED ORDER — ZOLPIDEM TARTRATE ER 12.5 MG PO TBCR: 12.5000 mg | EXTENDED_RELEASE_TABLET | Freq: Every evening | ORAL | 0 refills | Status: DC | PRN

## 2019-07-21 NOTE — Progress Notes (Signed)
Patient requesting refill on Ambien. Called to pharmacy.

## 2019-07-21 NOTE — Telephone Encounter (Signed)
Left vm to Dr. Tenna Child office  In ref to appt. [phone]

## 2019-07-23 ENCOUNTER — Encounter: Payer: Self-pay | Admitting: Adult Health

## 2019-07-23 ENCOUNTER — Telehealth: Payer: Self-pay | Admitting: *Deleted

## 2019-07-23 ENCOUNTER — Other Ambulatory Visit: Payer: Self-pay | Admitting: Oncology

## 2019-07-23 ENCOUNTER — Encounter: Payer: Self-pay | Admitting: *Deleted

## 2019-07-23 NOTE — Telephone Encounter (Addendum)
Left VM with office requesting call for new patient appointment. Referral placed on 07/14/19 and records have been sent by HIM. Noted patient will not be available 9/21-9/25 (vacation). Provided direct # for return call. @ 1220 Received return VM from [facility]: Provide Steve Carter [phone] to call to set up referral visit. @1415  Called above # and left VM requesting appointment for patient.

## 2019-07-24 ENCOUNTER — Telehealth: Payer: Self-pay | Admitting: *Deleted

## 2019-07-24 NOTE — Telephone Encounter (Signed)
Spoke with new patient coordinator with psychology department to confirm MD feels his cognitive decline is most related to depression. They will reach out to his wife to schedule a new patient appointment.

## 2019-07-27 ENCOUNTER — Inpatient Hospital Stay: Payer: BC Managed Care – PPO

## 2019-07-27 ENCOUNTER — Other Ambulatory Visit: Payer: Self-pay

## 2019-07-27 ENCOUNTER — Other Ambulatory Visit: Payer: BC Managed Care – PPO

## 2019-07-27 ENCOUNTER — Encounter: Payer: Self-pay | Admitting: Nurse Practitioner

## 2019-07-27 ENCOUNTER — Ambulatory Visit: Payer: BC Managed Care – PPO | Admitting: Oncology

## 2019-07-27 ENCOUNTER — Inpatient Hospital Stay: Payer: BC Managed Care – PPO | Admitting: Nurse Practitioner

## 2019-07-27 VITALS — BP 168/100 | HR 88 | Temp 98.3°F | Resp 18 | Ht 74.0 in | Wt 247.7 lb

## 2019-07-27 DIAGNOSIS — C9000 Multiple myeloma not having achieved remission: Secondary | ICD-10-CM

## 2019-07-27 LAB — CBC WITH DIFFERENTIAL (CANCER CENTER ONLY)
Abs Immature Granulocytes: 0.11 10*3/uL — ABNORMAL HIGH (ref 0.00–0.07)
Basophils Absolute: 0.1 10*3/uL (ref 0.0–0.1)
Basophils Relative: 1 %
Eosinophils Absolute: 0.1 10*3/uL (ref 0.0–0.5)
Eosinophils Relative: 1 %
HCT: 43.5 % (ref 39.0–52.0)
Hemoglobin: 14.9 g/dL (ref 13.0–17.0)
Immature Granulocytes: 1 %
Lymphocytes Relative: 14 %
Lymphs Abs: 1.1 10*3/uL (ref 0.7–4.0)
MCH: 32 pg (ref 26.0–34.0)
MCHC: 34.3 g/dL (ref 30.0–36.0)
MCV: 93.5 fL (ref 80.0–100.0)
Monocytes Absolute: 1.1 10*3/uL — ABNORMAL HIGH (ref 0.1–1.0)
Monocytes Relative: 13 %
Neutro Abs: 5.7 10*3/uL (ref 1.7–7.7)
Neutrophils Relative %: 70 %
Platelet Count: 193 10*3/uL (ref 150–400)
RBC: 4.65 MIL/uL (ref 4.22–5.81)
RDW: 14.5 % (ref 11.5–15.5)
WBC Count: 8.1 10*3/uL (ref 4.0–10.5)
nRBC: 0 % (ref 0.0–0.2)

## 2019-07-27 LAB — CMP (CANCER CENTER ONLY)
ALT: 33 U/L (ref 0–44)
AST: 18 U/L (ref 15–41)
Albumin: 3.9 g/dL (ref 3.5–5.0)
Alkaline Phosphatase: 56 U/L (ref 38–126)
Anion gap: 8 (ref 5–15)
BUN: 8 mg/dL (ref 6–20)
CO2: 29 mmol/L (ref 22–32)
Calcium: 9 mg/dL (ref 8.9–10.3)
Chloride: 102 mmol/L (ref 98–111)
Creatinine: 1.05 mg/dL (ref 0.61–1.24)
GFR, Est AFR Am: >60 mL/min (ref >60)
GFR, Estimated: >60 mL/min (ref >60)
Glucose, Bld: 90 mg/dL (ref 70–99)
Potassium: 3.7 mmol/L (ref 3.5–5.1)
Sodium: 139 mmol/L (ref 135–145)
Total Bilirubin: 0.5 mg/dL (ref 0.3–1.2)
Total Protein: 7.1 g/dL (ref 6.5–8.1)

## 2019-07-27 MED ORDER — PNEUMOCOCCAL VAC POLYVALENT 25 MCG/0.5ML IJ INJ
0.5000 mL | INJECTION | Freq: Once | INTRAMUSCULAR | Status: AC
  Administered 2019-07-27: 0.5 mL via INTRAMUSCULAR
  Filled 2019-07-27: qty 0.5

## 2019-07-27 NOTE — Progress Notes (Addendum)
[facility] OFFICE PROGRESS NOTE   Diagnosis: Multiple myeloma  INTERVAL HISTORY:   Steve Carter returns as scheduled.  Overall he feels better.  The diarrhea is less.  He continues to feel "fuzzy".  He reports a good appetite.  Not a lot of interest in doing things outside of his home.  No nausea or vomiting.  No headaches.  No visual disturbance.  Objective:  Vital signs in last 24 hours:  Blood pressure (!) 168/100, pulse 88, temperature 98.3 F (36.8 C), temperature source Oral, resp. rate 18, height '6\' 2"'$  (1.88 m), weight 247 lb 11.2 oz (112.4 kg), SpO2 99 %.    HEENT: No thrush or ulcers. GI: Abdomen soft and nontender.  No hepatomegaly. Vascular: No leg edema. Neuro: Alert.  Oriented to person and place.  Oriented to month and year.  He is unable to state today's date.  Follows commands.    Lab Results:  Lab Results  Component Value Date   WBC 8.1 07/27/2019   HGB 14.9 07/27/2019   HCT 43.5 07/27/2019   MCV 93.5 07/27/2019   PLT 193 07/27/2019   NEUTROABS 5.7 07/27/2019    Imaging:  No results found.  Medications: I have reviewed the patient's current medications.  Assessment/Plan: 1. Multiple myeloma-IgG lambda serum monoclonal protein, elevated serum lambda light chains  Bone survey 10/11/2017-lytic lesions noted in the skull, right iliac, left second rib, and mottled appearance of the proximal femurs/pelvis  Bone marrow biopsy 10/14/2017-hypercellular marrow with plasma cell neoplasm, 82% plasma cells, lambda light chain restricted, hyperdiploid with gains of chromosomes 3, 5, 7, 9, and 11. FISH panel positive for gain of ATM(+11)  Cycle 1 RVD 10/18/2017 (Revlimid started 10/23/2017)  Cycle 2 RVD 11/15/2017  Cycle 3 RVD 12/13/2017  Cycle 4 RVD 01/14/2018  Cycle 5 of RVD 02/14/2018 (Revlimid given for 7 days and 1 week of Velcade), therapy held beginning 02/21/2018 secondary to plan for stem cell therapy  Bone marrow biopsy  02/20/2018, 1-2% plasma cell  PET scan 02/20/2018, no malignant range activity above background, numerous lytic lesions throughout the axial and appendicular skeleton,, left iliac wing fracture  Melphalan, 200 mg/m on 03/24/2018  Stem cell infusion 03/25/2018  Restaging at [facility] 07/09/2018: Normal lambda light chains, no serum M spike, bone marrow biopsy negative for myeloma, less than 1% plasma cells  PET scan at [facility] 07/09/2018-lytic bone lesions, no malignant range activity  Initiation of maintenance Revlimid, 10 mg, 21/28 days 08/15/2018  Cycle 2 maintenance Revlimid 09/12/2018  Cycle 3 maintenance Revlimid 10/10/2018  Revlimid placed on hold 06/15/2019 due to presyncopal/syncopal episodes and diarrhea  Revlimid resumed 5 mg 21 days on/7 days off following office visit 06/29/2019  Regimen placed on hold 07/14/2019   2.Pain secondary to multiple myeloma involving the spine and pelvis-resolved  MRI of the lumbar spine 10/02/2018-numerous rounded foci in the vertebral bodies, hypertrophy of the L4 transverse process  3.Hypertension-losartan dose increased 11/15/2017  4.Depression  5.  Altered mental status  Disposition: [name] appears stable.  He will continue to hold Revlimid.  We will follow-up on the myeloma panel from today.  He continues to have an altered mental status.  He has an appointment tomorrow with Dr. Jonell Cluck at [facility].   He will return for lab and follow-up in 3 to 4 weeks.  He will contact the office in the interim with any problems.  His wife was present for today's visit by telephone.  Patient seen with Dr. Benay Spice.  [name] ANP/GNP-BC   07/27/2019  3:47 PM  This was a shared visit with [name].  Mr. Feltus is now maintained off Revlimid.  He appears more alert today and diarrhea has improved.  He is scheduled to undergo a psychology evaluation tomorrow. Julieanne Manson, MD

## 2019-07-27 NOTE — Patient Instructions (Signed)
Pneumococcal Conjugate Vaccine suspension for injection What is this medicine? PNEUMOCOCCAL VACCINE (NEU mo KOK al vak SEEN) is a vaccine used to prevent pneumococcus bacterial infections. These bacteria can cause serious infections like pneumonia, meningitis, and blood infections. This vaccine will lower your chance of getting pneumonia. If you do get pneumonia, it can make your symptoms milder and your illness shorter. This vaccine will not treat an infection and will not cause infection. This vaccine is recommended for infants and young children, adults with certain medical conditions, and adults 65 years or older. This medicine may be used for other purposes; ask your health care provider or pharmacist if you have questions. COMMON BRAND NAME(S): Prevnar, Prevnar 13 What should I tell my health care provider before I take this medicine? They need to know if you have any of these conditions:  bleeding problems  fever  immune system problems  an unusual or allergic reaction to pneumococcal vaccine, diphtheria toxoid, other vaccines, latex, other medicines, foods, dyes, or preservatives  pregnant or trying to get pregnant  breast-feeding How should I use this medicine? This vaccine is for injection into a muscle. It is given by a health care professional. A copy of Vaccine Information Statements will be given before each vaccination. Read this sheet carefully each time. The sheet may change frequently. Talk to your pediatrician regarding the use of this medicine in children. While this drug may be prescribed for children as young as 6 weeks old for selected conditions, precautions do apply. Overdosage: If you think you have taken too much of this medicine contact a poison control center or emergency room at once. NOTE: This medicine is only for you. Do not share this medicine with others. What if I miss a dose? It is important not to miss your dose. Call your doctor or health care  professional if you are unable to keep an appointment. What may interact with this medicine?  medicines for cancer chemotherapy  medicines that suppress your immune function  steroid medicines like prednisone or cortisone This list may not describe all possible interactions. Give your health care provider a list of all the medicines, herbs, non-prescription drugs, or dietary supplements you use. Also tell them if you smoke, drink alcohol, or use illegal drugs. Some items may interact with your medicine. What should I watch for while using this medicine? Mild fever and pain should go away in 3 days or less. Report any unusual symptoms to your doctor or health care professional. What side effects may I notice from receiving this medicine? Side effects that you should report to your doctor or health care professional as soon as possible:  allergic reactions like skin rash, itching or hives, swelling of the face, lips, or tongue  breathing problems  confused  fast or irregular heartbeat  fever over 102 degrees F  seizures  unusual bleeding or bruising  unusual muscle weakness Side effects that usually do not require medical attention (report to your doctor or health care professional if they continue or are bothersome):  aches and pains  diarrhea  fever of 102 degrees F or less  headache  irritable  loss of appetite  pain, tender at site where injected  trouble sleeping This list may not describe all possible side effects. Call your doctor for medical advice about side effects. You may report side effects to FDA at 1-800-FDA-1088. Where should I keep my medicine? This does not apply. This vaccine is given in a clinic, pharmacy, doctor's office,   or other health care setting and will not be stored at home. NOTE: This sheet is a summary. It may not cover all possible information. If you have questions about this medicine, talk to your doctor, pharmacist, or health care  provider.  2020 Elsevier/Gold Standard (2014-08-26 10:27:27)  

## 2019-07-28 ENCOUNTER — Telehealth: Payer: Self-pay

## 2019-07-28 ENCOUNTER — Encounter: Payer: Self-pay | Admitting: Nurse Practitioner

## 2019-07-28 ENCOUNTER — Telehealth: Payer: Self-pay | Admitting: Oncology

## 2019-07-28 ENCOUNTER — Telehealth: Payer: Self-pay | Admitting: Nurse Practitioner

## 2019-07-28 LAB — KAPPA/LAMBDA LIGHT CHAINS
Kappa free light chain: 15.8 mg/L (ref 3.3–19.4)
Kappa, lambda light chain ratio: 0.66 (ref 0.26–1.65)
Lambda free light chains: 24.1 mg/L (ref 5.7–26.3)

## 2019-07-28 LAB — IGG: IgG (Immunoglobin G), Serum: 861 mg/dL (ref 603–1613)

## 2019-07-28 NOTE — Telephone Encounter (Signed)
-----   Message from Owens Shark, NP sent at 07/27/2019  4:03 PM EDT ----- He requested a refill on Ambien CR at today's visit.  It looks like this was just refilled on 07/21/2019.  Please let his wife know.

## 2019-07-28 NOTE — Telephone Encounter (Signed)
R/s appt per 8/25 sch message - spoke with patient and he is aware of appt date and time

## 2019-07-28 NOTE — Telephone Encounter (Signed)
Called and left msg. Mailed printout  °

## 2019-07-28 NOTE — Telephone Encounter (Signed)
TC to Pt's wife per Leander Rams NP informed wife that prescription was refilled on 07/21/19. Pt's wife stated they didn't realize they had requested the refill  and will pick it up from the pharmacy. No further problems or concerns noted.

## 2019-07-29 LAB — PROTEIN ELECTROPHORESIS, SERUM
A/G Ratio: 1.3 (ref 0.7–1.7)
Albumin ELP: 3.7 g/dL (ref 2.9–4.4)
Alpha-1-Globulin: 0.2 g/dL (ref 0.0–0.4)
Alpha-2-Globulin: 0.9 g/dL (ref 0.4–1.0)
Beta Globulin: 0.9 g/dL (ref 0.7–1.3)
Gamma Globulin: 0.9 g/dL (ref 0.4–1.8)
Globulin, Total: 2.9 g/dL (ref 2.2–3.9)
M-Spike, %: 0.1 g/dL — ABNORMAL HIGH
Total Protein ELP: 6.6 g/dL (ref 6.0–8.5)

## 2019-08-03 ENCOUNTER — Ambulatory Visit (INDEPENDENT_AMBULATORY_CARE_PROVIDER_SITE_OTHER): Payer: BC Managed Care – PPO | Admitting: Adult Health

## 2019-08-03 ENCOUNTER — Encounter: Payer: Self-pay | Admitting: Adult Health

## 2019-08-03 ENCOUNTER — Ambulatory Visit: Payer: BC Managed Care – PPO | Admitting: Physician Assistant

## 2019-08-03 ENCOUNTER — Other Ambulatory Visit: Payer: Self-pay

## 2019-08-03 DIAGNOSIS — F331 Major depressive disorder, recurrent, moderate: Secondary | ICD-10-CM

## 2019-08-03 DIAGNOSIS — G4700 Insomnia, unspecified: Secondary | ICD-10-CM | POA: Diagnosis not present

## 2019-08-03 DIAGNOSIS — F339 Major depressive disorder, recurrent, unspecified: Secondary | ICD-10-CM | POA: Insufficient documentation

## 2019-08-03 DIAGNOSIS — F411 Generalized anxiety disorder: Secondary | ICD-10-CM | POA: Insufficient documentation

## 2019-08-03 MED ORDER — BUPROPION HCL ER (XL) 150 MG PO TB24: ORAL_TABLET | ORAL | 2 refills | Status: DC

## 2019-08-03 NOTE — Progress Notes (Signed)
[facility] MD/PA/NP Initial Note  08/03/2019 11:22 AM Steve Carter  MRN:  [id number]  Chief Complaint:  Chief Complaint    Anxiety; Depression; Insomnia     Depression, anxiety, insomnia, and irritability  HPI:  Describes mood today as "ok". Pleasant. Mood symptoms - reports decreased depression, anxiety, and irritability. Stating "I'm doing better". Getting out and doing things - "I'm out on a tractor today". Improved energy and motivation. Stating "I could feel better", I'm not quite there yet". Wife has commented that he is doing "better". Decreased worry. Able to let things "go". Taking current medications as prescribed. Energy levels improved. Active, exercising some - walking most days. Working around the house. Works full time.  Enjoys some usual interests and activities. Spending time with family wife - and 2 children.  Appetite improved. Weight gain. Sleeps well most nights. Averages 8 hours.  Focus and concentration "a little better". Completing tasks. Managing aspects of household. Able to make decisions a little easier.  Denies SI or HI.  Denies AH or VH.  Visit Diagnosis:    ICD-10-CM   1. Moderate episode of recurrent major depressive disorder (HCC)  F33.1   2. GAD (generalized anxiety disorder)  F41.1   3. Insomnia, unspecified type  G47.00     Past Psychiatric History: Denies  Past Medical History:  Past Medical History:  Diagnosis Date  . Anxiety   . Depression   . GERD (gastroesophageal reflux disease)   . Hypertension   . Sleep apnea    uses C-Pap    Past Surgical History:  Procedure Laterality Date  . CHOLECYSTECTOMY      Family Psychiatric History: Denies  Family History: History reviewed. No pertinent family history.  Social History:  Social History   Socioeconomic History  . Marital status: Married    Spouse name: Not on file  . Number of children: Not on file  . Years of education: Not on file  . Highest education level: Not on  file  Occupational History  . Not on file  Social Needs  . Financial resource strain: Not on file  . Food insecurity    Worry: Not on file    Inability: Not on file  . Transportation needs    Medical: Not on file    Non-medical: Not on file  Tobacco Use  . Smoking status: Never Smoker  . Smokeless tobacco: Current User    Types: Snuff  Substance and Sexual Activity  . Alcohol use: Yes    Alcohol/week: 1.0 standard drinks    Types: 1 Cans of beer per week  . Drug use: No  . Sexual activity: Not on file  Lifestyle  . Physical activity    Days per week: Not on file    Minutes per session: Not on file  . Stress: Not on file  Relationships  . Social Herbalist on phone: Not on file    Gets together: Not on file    Attends religious service: Not on file    Active member of club or organization: Not on file    Attends meetings of clubs or organizations: Not on file    Relationship status: Not on file  Other Topics Concern  . Not on file  Social History Narrative  . Not on file    Allergies: No Known Allergies  Metabolic Disorder Labs: No results found for: HGBA1C, MPG No results found for: PROLACTIN No results found for: CHOL, TRIG, HDL, CHOLHDL, VLDL, LDLCALC No  results found for: TSH  Therapeutic Level Labs: No results found for: LITHIUM No results found for: VALPROATE No components found for:  CBMZ  Current Medications: Current Outpatient Medications  Medication Sig Dispense Refill  . acyclovir (ZOVIRAX) 800 MG tablet TAKE 1 TABLET (800 MG TOTAL) BY MOUTH 2 (TWO) TIMES DAILY. (Patient not taking: Reported on 07/23/2019) 180 tablet 1  . ARIPiprazole (ABILIFY) 10 MG tablet Take 10 mg by mouth daily.    . ARIPiprazole (ABILIFY) 5 MG tablet Take 5 mg daily by mouth.    Marland Kitchen aspirin EC 81 MG tablet Take 1 tablet (81 mg total) daily by mouth.    Marland Kitchen buPROPion (WELLBUTRIN XL) 150 MG 24 hr tablet Take one tablet every morning x 7 days, then take two tablets every  morning. 60 tablet 2  . Calcium Carbonate-Vitamin D 600-400 MG-UNIT chew tablet Chew 1 tablet by mouth 2 (two) times daily.    . diphenoxylate-atropine (LOMOTIL) 2.5-0.025 MG tablet Take 1-2 tablets by mouth 4 (four) times daily as needed for diarrhea or loose stools (Maximum of 8 tabs/day). 60 tablet 0  . escitalopram (LEXAPRO) 20 MG tablet Take 20 mg daily by mouth.    . losartan (COZAAR) 100 MG tablet TAKE 1 TABLET BY MOUTH EVERY DAY 30 tablet 0  . metFORMIN (GLUCOPHAGE) 500 MG tablet Take 500 mg by mouth 2 (two) times daily.  3  . Omeprazole Magnesium (PRILOSEC OTC PO) Take 20 mg daily by mouth.    . REVLIMID 5 MG capsule TAKE 1 CAPSULE BY MOUTH  DAILY FOR 21 DAYS, THEN 7  DAYS OFF (Patient not taking: Reported on 07/23/2019) 21 capsule 0  . testosterone cypionate (DEPOTESTOTERONE CYPIONATE) 100 MG/ML injection Inject 200 mg every 14 (fourteen) days into the muscle. For IM use only    . zolpidem (AMBIEN CR) 12.5 MG CR tablet Take 1 tablet (12.5 mg total) by mouth at bedtime as needed for sleep. 30 tablet 0   No current facility-administered medications for this visit.     Medication Side Effects: none  Orders placed this visit:  No orders of the defined types were placed in this encounter.   Psychiatric Specialty Exam:  ROS  There were no vitals taken for this visit.There is no height or weight on file to calculate BMI.  General Appearance: Neat and Well Groomed  Eye Contact:  Good  Speech:  Clear and Coherent  Volume:  Normal  Mood:  Depressed  Affect:  Congruent  Thought Process:  Coherent  Orientation:  Full (Time, Place, and Person)  Thought Content: Logical   Suicidal Thoughts:  No  Homicidal Thoughts:  No  Memory:  WNL  Judgement:  Good  Insight:  Fair  Psychomotor Activity:  Normal  Concentration:  Concentration: Fair  Recall:  [place] of Knowledge: Good  Language: Good  Assets:  Communication Skills Desire for Improvement Financial  Resources/Insurance Housing Intimacy Leisure Time Physical Health Resilience Social Support Talents/Skills Transportation Vocational/Educational  ADL's:  Intact  Cognition: WNL  Prognosis:  Good   Screenings: none  Receiving Psychotherapy: No   Treatment Plan/Recommendations:   Plan:  1. Increase Wellbutrin XL 300mg  daily to 450mg  every morning. 2. Continue Abilify 10mg  tab daily 3. Continue Lexapro 20mg  daily 4. Continue Ambien CR 12.5mg  daily  RTC 4 weeks  Patient advised to contact office with any questions, adverse effects, or acute worsening in signs and symptoms.  Discussed potential metabolic side effects associated with atypical antipsychotics, as well as potential  risk for movement side effects. Advised pt to contact office if movement side effects occur.   Time spent 30 minutes  Aloha Gell, NP

## 2019-08-05 ENCOUNTER — Other Ambulatory Visit: Payer: Self-pay | Admitting: Adult Health

## 2019-08-05 DIAGNOSIS — F411 Generalized anxiety disorder: Secondary | ICD-10-CM

## 2019-08-17 ENCOUNTER — Other Ambulatory Visit: Payer: BC Managed Care – PPO

## 2019-08-17 ENCOUNTER — Ambulatory Visit: Payer: BC Managed Care – PPO | Admitting: Oncology

## 2019-08-18 ENCOUNTER — Inpatient Hospital Stay: Payer: BC Managed Care – PPO | Attending: Nurse Practitioner

## 2019-08-18 ENCOUNTER — Other Ambulatory Visit: Payer: Self-pay

## 2019-08-18 ENCOUNTER — Inpatient Hospital Stay (HOSPITAL_BASED_OUTPATIENT_CLINIC_OR_DEPARTMENT_OTHER): Payer: BC Managed Care – PPO | Admitting: Oncology

## 2019-08-18 VITALS — BP 138/89 | HR 68 | Temp 98.3°F | Resp 18 | Ht 74.0 in | Wt 247.5 lb

## 2019-08-18 DIAGNOSIS — Z23 Encounter for immunization: Secondary | ICD-10-CM

## 2019-08-18 DIAGNOSIS — Z79899 Other long term (current) drug therapy: Secondary | ICD-10-CM | POA: Insufficient documentation

## 2019-08-18 DIAGNOSIS — R4182 Altered mental status, unspecified: Secondary | ICD-10-CM | POA: Insufficient documentation

## 2019-08-18 DIAGNOSIS — F329 Major depressive disorder, single episode, unspecified: Secondary | ICD-10-CM | POA: Insufficient documentation

## 2019-08-18 DIAGNOSIS — C9000 Multiple myeloma not having achieved remission: Secondary | ICD-10-CM

## 2019-08-18 DIAGNOSIS — R197 Diarrhea, unspecified: Secondary | ICD-10-CM | POA: Insufficient documentation

## 2019-08-18 DIAGNOSIS — R251 Tremor, unspecified: Secondary | ICD-10-CM | POA: Diagnosis not present

## 2019-08-18 DIAGNOSIS — R55 Syncope and collapse: Secondary | ICD-10-CM | POA: Diagnosis not present

## 2019-08-18 DIAGNOSIS — I10 Essential (primary) hypertension: Secondary | ICD-10-CM | POA: Diagnosis not present

## 2019-08-18 DIAGNOSIS — G893 Neoplasm related pain (acute) (chronic): Secondary | ICD-10-CM | POA: Insufficient documentation

## 2019-08-18 LAB — CBC WITH DIFFERENTIAL (CANCER CENTER ONLY)
Abs Immature Granulocytes: 0.08 10*3/uL — ABNORMAL HIGH (ref 0.00–0.07)
Basophils Absolute: 0.1 10*3/uL (ref 0.0–0.1)
Basophils Relative: 1 %
Eosinophils Absolute: 0.1 10*3/uL (ref 0.0–0.5)
Eosinophils Relative: 1 %
HCT: 44.8 % (ref 39.0–52.0)
Hemoglobin: 15.4 g/dL (ref 13.0–17.0)
Immature Granulocytes: 1 %
Lymphocytes Relative: 13 %
Lymphs Abs: 1 10*3/uL (ref 0.7–4.0)
MCH: 32.4 pg (ref 26.0–34.0)
MCHC: 34.4 g/dL (ref 30.0–36.0)
MCV: 94.1 fL (ref 80.0–100.0)
Monocytes Absolute: 0.6 10*3/uL (ref 0.1–1.0)
Monocytes Relative: 8 %
Neutro Abs: 5.9 10*3/uL (ref 1.7–7.7)
Neutrophils Relative %: 76 %
Platelet Count: 210 10*3/uL (ref 150–400)
RBC: 4.76 MIL/uL (ref 4.22–5.81)
RDW: 13.9 % (ref 11.5–15.5)
WBC Count: 7.8 10*3/uL (ref 4.0–10.5)
nRBC: 0 % (ref 0.0–0.2)

## 2019-08-18 LAB — CMP (CANCER CENTER ONLY)
ALT: 24 U/L (ref 0–44)
AST: 21 U/L (ref 15–41)
Albumin: 4.1 g/dL (ref 3.5–5.0)
Alkaline Phosphatase: 50 U/L (ref 38–126)
Anion gap: 8 (ref 5–15)
BUN: 11 mg/dL (ref 6–20)
CO2: 27 mmol/L (ref 22–32)
Calcium: 9.3 mg/dL (ref 8.9–10.3)
Chloride: 105 mmol/L (ref 98–111)
Creatinine: 1.19 mg/dL (ref 0.61–1.24)
GFR, Est AFR Am: >60 mL/min (ref >60)
GFR, Estimated: >60 mL/min (ref >60)
Glucose, Bld: 109 mg/dL — ABNORMAL HIGH (ref 70–99)
Potassium: 3.8 mmol/L (ref 3.5–5.1)
Sodium: 140 mmol/L (ref 135–145)
Total Bilirubin: 0.6 mg/dL (ref 0.3–1.2)
Total Protein: 6.9 g/dL (ref 6.5–8.1)

## 2019-08-18 MED ORDER — INFLUENZA VAC SPLIT QUAD 0.5 ML IM SUSY
PREFILLED_SYRINGE | INTRAMUSCULAR | Status: AC
  Filled 2019-08-18: qty 0.5

## 2019-08-18 MED ORDER — INFLUENZA VAC SPLIT QUAD 0.5 ML IM SUSY
0.5000 mL | PREFILLED_SYRINGE | Freq: Once | INTRAMUSCULAR | Status: AC
  Administered 2019-08-18: 0.5 mL via INTRAMUSCULAR

## 2019-08-18 MED ORDER — PNEUMOCOCCAL VAC POLYVALENT 25 MCG/0.5ML IJ INJ: 0.5000 mL | INJECTION | Freq: Once | INTRAMUSCULAR | Status: DC

## 2019-08-18 NOTE — Progress Notes (Signed)
[name] OFFICE PROGRESS NOTE   Diagnosis: Multiple myeloma  INTERVAL HISTORY:   [name] returns as scheduled.  He saw Dr. Jonell Carter.  He started Wellbutrin approximately 2 weeks ago and reports significant improvement in his energy level and depression symptoms.  He has been working around the home and is more interested in activities.  He had a presyncope episode when standing yesterday.  No syncope or pain.  He has a persistent tremor in the hands.  [name] is being referred for neuropsychiatric testing.  He remains off of Revlimid.  Objective:  Vital signs in last 24 hours:  Blood pressure 138/89, pulse 68, temperature 98.3 F (36.8 C), temperature source Temporal, resp. rate 18, height _0  (1.88 m), weight 247 lb 8 oz (112.3 kg).    Cardio: Regular rate and rhythm Vascular: No leg edema, varicosities in the left lower leg Neuro: Alert, the motor exam appears intact in the upper and lower extremities bilaterally, finger-to-nose testing is normal   Lab Results:  Lab Results  Component Value Date   WBC 7.8 08/18/2019   HGB 15.4 08/18/2019   HCT 44.8 08/18/2019   MCV 94.1 08/18/2019   PLT 210 08/18/2019   NEUTROABS 5.9 08/18/2019    CMP  Lab Results  Component Value Date   NA 140 08/18/2019   K 3.8 08/18/2019   CL 105 08/18/2019   CO2 27 08/18/2019   GLUCOSE 109 (H) 08/18/2019   BUN 11 08/18/2019   CREATININE 1.19 08/18/2019   CALCIUM 9.3 08/18/2019   PROT 6.9 08/18/2019   ALBUMIN 4.1 08/18/2019   AST 21 08/18/2019   ALT 24 08/18/2019   ALKPHOS 50 08/18/2019   BILITOT 0.6 08/18/2019   GFRNONAA >60 08/18/2019   GFRAA >60 08/18/2019   07/27/2019: IgG 861, lambda free light chains 24.1, serum M spike 0.1  Medications: I have reviewed the patient's current medications.   Assessment/Plan: 1. Multiple myeloma-IgG lambda serum monoclonal protein, elevated serum lambda light chains  Bone survey 10/11/2017-lytic lesions noted  in the skull, right iliac, left second rib, and mottled appearance of the proximal femurs/pelvis  Bone marrow biopsy 10/14/2017-hypercellular marrow with plasma cell neoplasm, 82% plasma cells, lambda light chain restricted, hyperdiploid with gains of chromosomes 3, 5, 7, 9, and 11. FISH panel positive for gain of ATM(+11)  Cycle 1 RVD 10/18/2017 (Revlimid started 10/23/2017)  Cycle 2 RVD 11/15/2017  Cycle 3 RVD 12/13/2017  Cycle 4 RVD 01/14/2018  Cycle 5 of RVD 02/14/2018 (Revlimid given for 7 days and 1 week of Velcade), therapy held beginning 02/21/2018 secondary to plan for stem cell therapy  Bone marrow biopsy 02/20/2018, 1-2% plasma cell  PET scan 02/20/2018, no malignant range activity above background, numerous lytic lesions throughout the axial and appendicular skeleton,, left iliac wing fracture  Melphalan, 200 mg/m on 03/24/2018  Stem cell infusion 03/25/2018  Restaging at [facility] 07/09/2018: Normal lambda light chains, no serum M spike, bone marrow biopsy negative for myeloma, less than 1% plasma cells  [facility] scan at [facility] 07/09/2018-lytic bone lesions, no malignant range activity  Initiation of maintenance Revlimid, 10 mg, 21/28 days 08/15/2018  Cycle 2 maintenance Revlimid 09/12/2018  Cycle 3 maintenance Revlimid 10/10/2018  Revlimid placed on hold 06/15/2019 due to presyncopal/syncopal episodes and diarrhea  Revlimid resumed 5 mg 21 days on/7 days off following office visit 06/29/2019  Revlimid placed on hold 07/14/2019  Revlimid resumed 08/21/2019   2.Pain secondary to multiple myeloma involving the spine and pelvis-resolved  MRI of the lumbar spine 10/02/2018-numerous rounded foci in the vertebral bodies, hypertrophy of the L4 transverse process  3.Hypertension-losartan dose increased 11/15/2017  4.Depression-improved with Wellbutrin beginning September 2020  5.  Altered mental status-improved depression related?    Disposition: [name] has an improved performance status compared to when we saw him last month.  This is likely secondary to improvement in depression symptoms since starting Wellbutrin.  He will continue follow-up at [facility] and plans to undergo neuropsychiatric testing.  It is possible his symptoms were in part related to Revlimid. He will resume Revlimid.  He will contact us for new symptoms after resuming Revlimid.  [name] will return for an office and lab visit in 1 month.  He will receive Zometa when he returns next month.  He received influenza and pneumococcal vaccines today. Betsy Coder, MD  08/18/2019  3:09 PM

## 2019-08-19 ENCOUNTER — Other Ambulatory Visit: Payer: Self-pay | Admitting: *Deleted

## 2019-08-19 LAB — KAPPA/LAMBDA LIGHT CHAINS
Kappa free light chain: 15.8 mg/L (ref 3.3–19.4)
Kappa, lambda light chain ratio: 0.51 (ref 0.26–1.65)
Lambda free light chains: 30.7 mg/L — ABNORMAL HIGH (ref 5.7–26.3)

## 2019-08-19 LAB — PROTEIN ELECTROPHORESIS, SERUM
A/G Ratio: 1.5 (ref 0.7–1.7)
Albumin ELP: 3.8 g/dL (ref 2.9–4.4)
Alpha-1-Globulin: 0.2 g/dL (ref 0.0–0.4)
Alpha-2-Globulin: 0.8 g/dL (ref 0.4–1.0)
Beta Globulin: 0.9 g/dL (ref 0.7–1.3)
Gamma Globulin: 0.8 g/dL (ref 0.4–1.8)
Globulin, Total: 2.6 g/dL (ref 2.2–3.9)
M-Spike, %: 0.3 g/dL — ABNORMAL HIGH
Total Protein ELP: 6.4 g/dL (ref 6.0–8.5)

## 2019-08-19 LAB — IGG: IgG (Immunoglobin G), Serum: 964 mg/dL (ref 603–1613)

## 2019-08-19 MED ORDER — LENALIDOMIDE 5 MG PO CAPS: ORAL_CAPSULE | ORAL | 0 refills | Status: DC

## 2019-08-20 ENCOUNTER — Encounter: Payer: Self-pay | Admitting: Nurse Practitioner

## 2019-08-21 ENCOUNTER — Other Ambulatory Visit: Payer: Self-pay | Admitting: *Deleted

## 2019-08-21 DIAGNOSIS — C9000 Multiple myeloma not having achieved remission: Secondary | ICD-10-CM

## 2019-08-21 MED ORDER — ZOLPIDEM TARTRATE ER 12.5 MG PO TBCR: 12.5000 mg | EXTENDED_RELEASE_TABLET | Freq: Every evening | ORAL | 0 refills | Status: DC | PRN

## 2019-08-27 ENCOUNTER — Other Ambulatory Visit: Payer: Self-pay | Admitting: Adult Health

## 2019-08-27 DIAGNOSIS — F411 Generalized anxiety disorder: Secondary | ICD-10-CM

## 2019-08-27 DIAGNOSIS — F331 Major depressive disorder, recurrent, moderate: Secondary | ICD-10-CM

## 2019-09-02 ENCOUNTER — Encounter: Payer: Self-pay | Admitting: Oncology

## 2019-09-03 ENCOUNTER — Encounter: Payer: Self-pay | Admitting: Nurse Practitioner

## 2019-09-03 ENCOUNTER — Other Ambulatory Visit: Payer: Self-pay

## 2019-09-03 ENCOUNTER — Inpatient Hospital Stay: Payer: BC Managed Care – PPO | Attending: Nurse Practitioner | Admitting: Nurse Practitioner

## 2019-09-03 VITALS — BP 167/96 | HR 89 | Temp 98.7°F | Resp 17 | Ht 74.0 in | Wt 250.4 lb

## 2019-09-03 DIAGNOSIS — C9000 Multiple myeloma not having achieved remission: Secondary | ICD-10-CM | POA: Diagnosis not present

## 2019-09-03 DIAGNOSIS — R4182 Altered mental status, unspecified: Secondary | ICD-10-CM | POA: Insufficient documentation

## 2019-09-03 DIAGNOSIS — G893 Neoplasm related pain (acute) (chronic): Secondary | ICD-10-CM | POA: Diagnosis not present

## 2019-09-03 DIAGNOSIS — F329 Major depressive disorder, single episode, unspecified: Secondary | ICD-10-CM | POA: Insufficient documentation

## 2019-09-03 DIAGNOSIS — Z79899 Other long term (current) drug therapy: Secondary | ICD-10-CM | POA: Insufficient documentation

## 2019-09-03 DIAGNOSIS — I10 Essential (primary) hypertension: Secondary | ICD-10-CM | POA: Diagnosis not present

## 2019-09-03 DIAGNOSIS — R102 Pelvic and perineal pain: Secondary | ICD-10-CM | POA: Insufficient documentation

## 2019-09-03 DIAGNOSIS — R197 Diarrhea, unspecified: Secondary | ICD-10-CM | POA: Diagnosis not present

## 2019-09-03 NOTE — Progress Notes (Addendum)
[facility] OFFICE PROGRESS NOTE   Diagnosis: Multiple myeloma  INTERVAL HISTORY:   Steve Carter returns prior to scheduled follow-up for evaluation of pain at the right buttock.  He initially noted pain about 2 days ago.  The pain is present intermittently.  He had pain for several hours yesterday.  He has no pain at present.  He denies any unusual activities.  The pain does not radiate.  No leg weakness or numbness.  No back pain.  No bowel or bladder dysfunction.  There are no relieving or exacerbating factors.  He took Tylenol with partial relief.  He continues Revlimid.  No nausea or vomiting.  No diarrhea.  He feels Wellbutrin is helping.  Objective:  Vital signs in last 24 hours:  Blood pressure (!) 167/96, pulse 89, temperature 98.7 F (37.1 C), temperature source Temporal, resp. rate 17, height '6\' 2"'$  (1.88 m), weight 250 lb 6.4 oz (113.6 kg), SpO2 99 %.    Vascular: No leg edema. Neuro: Alert, oriented.  Affect seems normal.  Lower extremity motor strength 5/5.  Knee DTRs 2+, symmetric. Skin: No rash right buttock region. Musculoskeletal: Nontender over the right buttock, no mass.   Lab Results:  Lab Results  Component Value Date   WBC 7.8 08/18/2019   HGB 15.4 08/18/2019   HCT 44.8 08/18/2019   MCV 94.1 08/18/2019   PLT 210 08/18/2019   NEUTROABS 5.9 08/18/2019    Imaging:  No results found.  Medications: I have reviewed the patient's current medications.  Assessment/Plan: 1. Multiple myeloma-IgG lambda serum monoclonal protein, elevated serum lambda light chains  Bone survey 10/11/2017-lytic lesions noted in the skull, right iliac, left second rib, and mottled appearance of the proximal femurs/pelvis  Bone marrow biopsy 10/14/2017-hypercellular marrow with plasma cell neoplasm, 82% plasma cells, lambda light chain restricted, hyperdiploid with gains of chromosomes 3, 5, 7, 9, and 11. FISH panel positive for gain of ATM(+11)  Cycle 1 RVD  10/18/2017 (Revlimid started 10/23/2017)  Cycle 2 RVD 11/15/2017  Cycle 3 RVD 12/13/2017  Cycle 4 RVD 01/14/2018  Cycle 5 of RVD 02/14/2018 (Revlimid given for 7 days and 1 week of Velcade), therapy held beginning 02/21/2018 secondary to plan for stem cell therapy  Bone marrow biopsy 02/20/2018, 1-2% plasma cell  PET scan 02/20/2018, no malignant range activity above background, numerous lytic lesions throughout the axial and appendicular skeleton,, left iliac wing fracture  Melphalan, 200 mg/m on 03/24/2018  Stem cell infusion 03/25/2018  Restaging at [facility] 07/09/2018: Normal lambda light chains, no serum M spike, bone marrow biopsy negative for myeloma, less than 1% plasma cells  PET scan at [facility] 07/09/2018-lytic bone lesions, no malignant range activity  Initiation of maintenance Revlimid, 10 mg, 21/28 days 08/15/2018  Cycle 2 maintenance Revlimid 09/12/2018  Cycle 3 maintenance Revlimid 10/10/2018  Revlimid placed on hold 06/15/2019 due to presyncopal/syncopal episodes and diarrhea  Revlimid resumed 5 mg 21 days on/7 days off following office visit 06/29/2019  Revlimid placed on hold 07/14/2019  Revlimid resumed 08/21/2019   2.Pain secondary to multiple myeloma involving the spine and pelvis-resolved  MRI of the lumbar spine 10/02/2018-numerous rounded foci in the vertebral bodies, hypertrophy of the L4 transverse process  3.Hypertension-losartan dose increased 11/15/2017  4.Depression-improved with Wellbutrin beginning September 2020  5.Altered mental status-improved depression related?  Disposition: Steve Carter appears stable.  He resumed Revlimid about 2 weeks ago and thus far seems to be tolerating it well.  He continues Wellbutrin and feels better  overall.  He began having intermittent pain at the right buttock 2 days ago.  Physical examination is unremarkable.  We are referring him for a bone survey.  He is scheduled to return for follow-up on  09/14/2019.  We will see him sooner if needed.  Patient seen with Dr. Benay Spice.    Steve Carter ANP/GNP-BC   09/03/2019  2:28 PM This was a shared visit with Steve Carter.  Steve Carter was interviewed and examined.  The pain may be related to a benign musculoskeletal condition or progression of myeloma.  He will be referred for a bone survey.  He will contact us for increased or persistent pain and we will consider additional imaging.  His overall performance status is significantly improved and he appears to be tolerating the Revlimid well. Julieanne Manson, MD

## 2019-09-07 ENCOUNTER — Encounter: Payer: Self-pay | Admitting: Nurse Practitioner

## 2019-09-08 ENCOUNTER — Other Ambulatory Visit (HOSPITAL_COMMUNITY): Payer: BC Managed Care – PPO

## 2019-09-09 ENCOUNTER — Encounter: Payer: Self-pay | Admitting: Nurse Practitioner

## 2019-09-09 ENCOUNTER — Other Ambulatory Visit: Payer: Self-pay

## 2019-09-09 ENCOUNTER — Ambulatory Visit (HOSPITAL_COMMUNITY)
Admission: RE | Admit: 2019-09-09 | Discharge: 2019-09-09 | Disposition: A | Discharge status: Home / Self Care | Payer: BC Managed Care – PPO | Source: Ambulatory Visit | Attending: Nurse Practitioner | Admitting: Nurse Practitioner

## 2019-09-09 DIAGNOSIS — C9000 Multiple myeloma not having achieved remission: Secondary | ICD-10-CM

## 2019-09-10 ENCOUNTER — Other Ambulatory Visit: Payer: Self-pay | Admitting: Nurse Practitioner

## 2019-09-10 DIAGNOSIS — C9000 Multiple myeloma not having achieved remission: Secondary | ICD-10-CM

## 2019-09-10 MED ORDER — TRAMADOL HCL 50 MG PO TABS: 50.0000 mg | ORAL_TABLET | Freq: Two times a day (BID) | ORAL | 0 refills | Status: DC | PRN

## 2019-09-12 ENCOUNTER — Other Ambulatory Visit: Payer: Self-pay | Admitting: Oncology

## 2019-09-14 ENCOUNTER — Inpatient Hospital Stay (HOSPITAL_BASED_OUTPATIENT_CLINIC_OR_DEPARTMENT_OTHER): Payer: BC Managed Care – PPO | Admitting: Nurse Practitioner

## 2019-09-14 ENCOUNTER — Encounter: Payer: Self-pay | Admitting: Nurse Practitioner

## 2019-09-14 ENCOUNTER — Inpatient Hospital Stay: Payer: BC Managed Care – PPO

## 2019-09-14 ENCOUNTER — Other Ambulatory Visit: Payer: Self-pay

## 2019-09-14 VITALS — BP 170/110 | HR 81 | Temp 98.7°F | Resp 18 | Ht 74.0 in | Wt 251.4 lb

## 2019-09-14 DIAGNOSIS — C9000 Multiple myeloma not having achieved remission: Secondary | ICD-10-CM

## 2019-09-14 LAB — CBC WITH DIFFERENTIAL (CANCER CENTER ONLY)
Abs Immature Granulocytes: 0.04 10*3/uL (ref 0.00–0.07)
Basophils Absolute: 0.1 10*3/uL (ref 0.0–0.1)
Basophils Relative: 1 %
Eosinophils Absolute: 0.1 10*3/uL (ref 0.0–0.5)
Eosinophils Relative: 1 %
HCT: 47.5 % (ref 39.0–52.0)
Hemoglobin: 16.6 g/dL (ref 13.0–17.0)
Immature Granulocytes: 1 %
Lymphocytes Relative: 21 %
Lymphs Abs: 1.3 10*3/uL (ref 0.7–4.0)
MCH: 31.8 pg (ref 26.0–34.0)
MCHC: 34.9 g/dL (ref 30.0–36.0)
MCV: 91 fL (ref 80.0–100.0)
Monocytes Absolute: 0.9 10*3/uL (ref 0.1–1.0)
Monocytes Relative: 14 %
Neutro Abs: 4.1 10*3/uL (ref 1.7–7.7)
Neutrophils Relative %: 62 %
Platelet Count: 213 10*3/uL (ref 150–400)
RBC: 5.22 MIL/uL (ref 4.22–5.81)
RDW: 13.1 % (ref 11.5–15.5)
WBC Count: 6.4 10*3/uL (ref 4.0–10.5)
nRBC: 0 % (ref 0.0–0.2)

## 2019-09-14 LAB — CMP (CANCER CENTER ONLY)
ALT: 47 U/L — ABNORMAL HIGH (ref 0–44)
AST: 31 U/L (ref 15–41)
Albumin: 4.3 g/dL (ref 3.5–5.0)
Alkaline Phosphatase: 52 U/L (ref 38–126)
Anion gap: 8 (ref 5–15)
BUN: 13 mg/dL (ref 6–20)
CO2: 31 mmol/L (ref 22–32)
Calcium: 9.4 mg/dL (ref 8.9–10.3)
Chloride: 101 mmol/L (ref 98–111)
Creatinine: 1.05 mg/dL (ref 0.61–1.24)
GFR, Est AFR Am: >60 mL/min (ref >60)
GFR, Estimated: >60 mL/min (ref >60)
Glucose, Bld: 98 mg/dL (ref 70–99)
Potassium: 3.6 mmol/L (ref 3.5–5.1)
Sodium: 140 mmol/L (ref 135–145)
Total Bilirubin: 0.5 mg/dL (ref 0.3–1.2)
Total Protein: 7.4 g/dL (ref 6.5–8.1)

## 2019-09-14 MED ORDER — OXYCODONE-ACETAMINOPHEN 5-325 MG PO TABS: 1.0000 | ORAL_TABLET | Freq: Three times a day (TID) | ORAL | 0 refills | Status: DC | PRN

## 2019-09-14 NOTE — Progress Notes (Addendum)
[facility] OFFICE PROGRESS NOTE   Diagnosis: Multiple myeloma  INTERVAL HISTORY:   Steve Carter returns as scheduled.  He began the Revlimid 7-day break 09/12/2019.  He is having intermittent loose stools.  At the most he has 2 loose stools a day.  This does not occur on a daily basis.  He continues to have pain at the right buttock.  The pain does not radiate.  No leg weakness or numbness.  No bowel or bladder dysfunction.  Tramadol is not effective.  Objective:  Vital signs in last 24 hours:  Blood pressure (!) 170/110, pulse 81, temperature 98.7 F (37.1 C), temperature source Temporal, resp. rate 18, height '6\' 2"'$  (1.88 m), weight 251 lb 6.4 oz (114 kg), SpO2 99 %.   Limited exam due to COVID-19. Vascular: No leg edema. Neuro: Lower extremity motor strength 5/5.   Lab Results:  Lab Results  Component Value Date   WBC 6.4 09/14/2019   HGB 16.6 09/14/2019   HCT 47.5 09/14/2019   MCV 91.0 09/14/2019   PLT 213 09/14/2019   NEUTROABS 4.1 09/14/2019    Imaging:  No results found.  Medications: I have reviewed the patient's current medications.  Assessment/Plan: 1.Multiple myeloma-IgG lambda serum monoclonal protein, elevated serum lambda light chains  Bone survey 10/11/2017-lytic lesions noted in the skull, right iliac, left second rib, and mottled appearance of the proximal femurs/pelvis  Bone marrow biopsy 10/14/2017-hypercellular marrow with plasma cell neoplasm, 82% plasma cells, lambda light chain restricted, hyperdiploid with gains of chromosomes 3, 5, 7, 9, and 11. FISH panel positive for gain of ATM(+11)  Cycle 1 RVD 10/18/2017 (Revlimid started 10/23/2017)  Cycle 2 RVD 11/15/2017  Cycle 3 RVD 12/13/2017  Cycle 4 RVD 01/14/2018  Cycle 5 of RVD 02/14/2018 (Revlimid given for 7 days and 1 week of Velcade), therapy held beginning 02/21/2018 secondary to plan for stem cell therapy  Bone marrow biopsy 02/20/2018, 1-2% plasma cell  PET scan  02/20/2018, no malignant range activity above background, numerous lytic lesions throughout the axial and appendicular skeleton,, left iliac wing fracture  Melphalan, 200 mg/m on 03/24/2018  Stem cell infusion 03/25/2018  Restaging at [facility] 07/09/2018: Normal lambda light chains, no serum M spike, bone marrow biopsy negative for myeloma, less than 1% plasma cells  PET scan at [facility] 07/09/2018-lytic bone lesions, no malignant range activity  Initiation of maintenance Revlimid, 10 mg, 21/28 days 08/15/2018  Cycle 2 maintenance Revlimid 09/12/2018  Cycle 3 maintenance Revlimid 10/10/2018  Revlimid placed on hold 06/15/2019 due to presyncopal/syncopal episodes and diarrhea  Revlimid resumed 5 mg 21 days on/7 days off following office visit 06/29/2019  Revlimidplaced on hold 07/14/2019  Revlimid resumed 08/21/2019  Bone survey 09/09/2019- no acute findings or clear explanation for right buttock pain.  Lucent lesions in the right pelvis are stable without pathologic fracture.  Evidence of healing lytic lesions in the right scapula, cervical spine spinous processes and left L4 transverse process.  Possible mild progression of lytic lesions within the L1 and L4 vertebral bodies.  Stable lytic lesions in the calvarium.   2.Pain secondary to multiple myeloma involving the spine and pelvis-resolved  MRI of the lumbar spine 10/02/2018-numerous rounded foci in the vertebral bodies, hypertrophy of the L4 transverse process  3.Hypertension-losartan dose increased 11/15/2017  4.Depression-improved with Wellbutrin beginning September 2020  5.Altered mental status-improved depression related?   Disposition: Mr. Demetro appears unchanged.  He is currently on the 7-day break from Revlimid.  He will begin  the next cycle 09/20/2019.  He continues to have pain at the right buttock.  The recent bone survey showed no clear explanation for the pain. We are referring him for CT scans  of the lumbar spine and pelvis.  Tramadol is not effective for the pain.  We are sending a prescription to his pharmacy for Percocet.  He will return for lab and follow-up in 4 weeks.  He will contact the office in the interim with any problems.  Patient seen with Dr. Benay Carter.    Steve Carter ANP/GNP-BC   09/14/2019  3:55 PM This was a shared visit with Steve Carter.  Steve Carter continues to have the right buttock.  The bones survey reveals no explanation for the pain.  He will be referred for CT imaging.  He continues maintenance Revlimid.  Steve Manson, Steve Carter

## 2019-09-18 ENCOUNTER — Encounter: Payer: Self-pay | Admitting: Nurse Practitioner

## 2019-09-18 ENCOUNTER — Other Ambulatory Visit: Payer: Self-pay | Admitting: Oncology

## 2019-09-18 ENCOUNTER — Other Ambulatory Visit: Payer: Self-pay

## 2019-09-18 ENCOUNTER — Ambulatory Visit (HOSPITAL_COMMUNITY)
Admission: RE | Admit: 2019-09-18 | Discharge: 2019-09-18 | Disposition: A | Discharge status: Home / Self Care | Payer: BC Managed Care – PPO | Source: Ambulatory Visit | Attending: Nurse Practitioner | Admitting: Nurse Practitioner

## 2019-09-18 ENCOUNTER — Ambulatory Visit (HOSPITAL_COMMUNITY): Admission: RE | Admit: 2019-09-18 | Payer: BC Managed Care – PPO | Source: Ambulatory Visit

## 2019-09-18 ENCOUNTER — Other Ambulatory Visit: Payer: Self-pay | Admitting: Nurse Practitioner

## 2019-09-18 DIAGNOSIS — C9000 Multiple myeloma not having achieved remission: Secondary | ICD-10-CM | POA: Diagnosis not present

## 2019-09-18 MED ORDER — OXYCODONE-ACETAMINOPHEN 5-325 MG PO TABS: 1.0000 | ORAL_TABLET | Freq: Four times a day (QID) | ORAL | 0 refills | Status: DC | PRN

## 2019-09-20 ENCOUNTER — Other Ambulatory Visit: Payer: Self-pay | Admitting: Hematology

## 2019-09-21 ENCOUNTER — Encounter: Payer: Self-pay | Admitting: Nurse Practitioner

## 2019-09-22 ENCOUNTER — Encounter: Payer: Self-pay | Admitting: Oncology

## 2019-09-22 ENCOUNTER — Encounter: Payer: Self-pay | Admitting: Nurse Practitioner

## 2019-09-22 ENCOUNTER — Other Ambulatory Visit: Payer: Self-pay | Admitting: Nurse Practitioner

## 2019-09-22 DIAGNOSIS — C9000 Multiple myeloma not having achieved remission: Secondary | ICD-10-CM

## 2019-09-22 MED ORDER — OXYCODONE-ACETAMINOPHEN 5-325 MG PO TABS: 1.0000 | ORAL_TABLET | Freq: Four times a day (QID) | ORAL | 0 refills | Status: DC | PRN

## 2019-09-23 ENCOUNTER — Encounter: Payer: Self-pay | Admitting: Nurse Practitioner

## 2019-09-23 ENCOUNTER — Other Ambulatory Visit: Payer: Self-pay | Admitting: *Deleted

## 2019-09-23 DIAGNOSIS — C9000 Multiple myeloma not having achieved remission: Secondary | ICD-10-CM

## 2019-09-23 MED ORDER — ZOLPIDEM TARTRATE ER 12.5 MG PO TBCR: 12.5000 mg | EXTENDED_RELEASE_TABLET | Freq: Every evening | ORAL | 0 refills | Status: DC | PRN

## 2019-10-06 ENCOUNTER — Other Ambulatory Visit: Payer: Self-pay | Admitting: Nurse Practitioner

## 2019-10-06 ENCOUNTER — Encounter: Payer: Self-pay | Admitting: Nurse Practitioner

## 2019-10-06 DIAGNOSIS — C9000 Multiple myeloma not having achieved remission: Secondary | ICD-10-CM

## 2019-10-06 MED ORDER — OXYCODONE-ACETAMINOPHEN 5-325 MG PO TABS: 1.0000 | ORAL_TABLET | Freq: Four times a day (QID) | ORAL | 0 refills | Status: DC | PRN

## 2019-10-07 ENCOUNTER — Other Ambulatory Visit: Payer: Self-pay | Admitting: Oncology

## 2019-10-07 ENCOUNTER — Encounter: Payer: Self-pay | Admitting: Nurse Practitioner

## 2019-10-08 ENCOUNTER — Encounter: Payer: Self-pay | Admitting: Nurse Practitioner

## 2019-10-09 ENCOUNTER — Telehealth: Payer: Self-pay | Admitting: *Deleted

## 2019-10-09 NOTE — Telephone Encounter (Signed)
Wife called to inquire if [facility] has contacted Dr. Benay Spice yet regarding starting chemotherapy again? They received his bone marrow results yesterday. Confirmed w/Dr. Benay Spice that he has not heard from [facility] yet. Wife notified and encouraged to keep his OV on Monday.

## 2019-10-12 ENCOUNTER — Other Ambulatory Visit: Payer: Self-pay | Admitting: *Deleted

## 2019-10-12 ENCOUNTER — Other Ambulatory Visit: Payer: Self-pay

## 2019-10-12 ENCOUNTER — Inpatient Hospital Stay: Payer: BC Managed Care – PPO

## 2019-10-12 ENCOUNTER — Inpatient Hospital Stay: Payer: BC Managed Care – PPO | Attending: Nurse Practitioner | Admitting: Oncology

## 2019-10-12 ENCOUNTER — Other Ambulatory Visit: Payer: Self-pay | Admitting: Oncology

## 2019-10-12 VITALS — BP 139/89 | HR 67 | Temp 98.3°F | Resp 17 | Ht 74.0 in | Wt 248.5 lb

## 2019-10-12 DIAGNOSIS — Z79891 Long term (current) use of opiate analgesic: Secondary | ICD-10-CM | POA: Insufficient documentation

## 2019-10-12 DIAGNOSIS — R102 Pelvic and perineal pain: Secondary | ICD-10-CM | POA: Diagnosis not present

## 2019-10-12 DIAGNOSIS — C9000 Multiple myeloma not having achieved remission: Secondary | ICD-10-CM

## 2019-10-12 DIAGNOSIS — R4182 Altered mental status, unspecified: Secondary | ICD-10-CM | POA: Diagnosis not present

## 2019-10-12 DIAGNOSIS — I10 Essential (primary) hypertension: Secondary | ICD-10-CM | POA: Diagnosis not present

## 2019-10-12 DIAGNOSIS — Z7189 Other specified counseling: Secondary | ICD-10-CM

## 2019-10-12 DIAGNOSIS — F329 Major depressive disorder, single episode, unspecified: Secondary | ICD-10-CM | POA: Diagnosis not present

## 2019-10-12 DIAGNOSIS — Z5112 Encounter for antineoplastic immunotherapy: Secondary | ICD-10-CM | POA: Diagnosis present

## 2019-10-12 DIAGNOSIS — G893 Neoplasm related pain (acute) (chronic): Secondary | ICD-10-CM | POA: Diagnosis not present

## 2019-10-12 DIAGNOSIS — R197 Diarrhea, unspecified: Secondary | ICD-10-CM | POA: Diagnosis not present

## 2019-10-12 DIAGNOSIS — Z79899 Other long term (current) drug therapy: Secondary | ICD-10-CM | POA: Insufficient documentation

## 2019-10-12 LAB — CBC WITH DIFFERENTIAL (CANCER CENTER ONLY)
Abs Immature Granulocytes: 0.13 10*3/uL — ABNORMAL HIGH (ref 0.00–0.07)
Basophils Absolute: 0.1 10*3/uL (ref 0.0–0.1)
Basophils Relative: 2 %
Eosinophils Absolute: 0.2 10*3/uL (ref 0.0–0.5)
Eosinophils Relative: 3 %
HCT: 48.6 % (ref 39.0–52.0)
Hemoglobin: 16.7 g/dL (ref 13.0–17.0)
Immature Granulocytes: 2 %
Lymphocytes Relative: 16 %
Lymphs Abs: 1.1 10*3/uL (ref 0.7–4.0)
MCH: 31 pg (ref 26.0–34.0)
MCHC: 34.4 g/dL (ref 30.0–36.0)
MCV: 90.2 fL (ref 80.0–100.0)
Monocytes Absolute: 0.8 10*3/uL (ref 0.1–1.0)
Monocytes Relative: 12 %
Neutro Abs: 4.2 10*3/uL (ref 1.7–7.7)
Neutrophils Relative %: 65 %
Platelet Count: 187 10*3/uL (ref 150–400)
RBC: 5.39 MIL/uL (ref 4.22–5.81)
RDW: 12.8 % (ref 11.5–15.5)
WBC Count: 6.5 10*3/uL (ref 4.0–10.5)
nRBC: 0 % (ref 0.0–0.2)

## 2019-10-12 LAB — CMP (CANCER CENTER ONLY)
ALT: 74 U/L — ABNORMAL HIGH (ref 0–44)
AST: 36 U/L (ref 15–41)
Albumin: 4.2 g/dL (ref 3.5–5.0)
Alkaline Phosphatase: 56 U/L (ref 38–126)
Anion gap: 14 (ref 5–15)
BUN: 14 mg/dL (ref 6–20)
CO2: 23 mmol/L (ref 22–32)
Calcium: 8.5 mg/dL — ABNORMAL LOW (ref 8.9–10.3)
Chloride: 101 mmol/L (ref 98–111)
Creatinine: 1.64 mg/dL — ABNORMAL HIGH (ref 0.61–1.24)
GFR, Est AFR Am: 55 mL/min — ABNORMAL LOW (ref >60)
GFR, Estimated: 47 mL/min — ABNORMAL LOW (ref >60)
Glucose, Bld: 145 mg/dL — ABNORMAL HIGH (ref 70–99)
Potassium: 3.9 mmol/L (ref 3.5–5.1)
Sodium: 138 mmol/L (ref 135–145)
Total Bilirubin: 0.6 mg/dL (ref 0.3–1.2)
Total Protein: 7.4 g/dL (ref 6.5–8.1)

## 2019-10-12 MED ORDER — POMALIDOMIDE 3 MG PO CAPS: 3.0000 mg | ORAL_CAPSULE | Freq: Every day | ORAL | 0 refills | Status: DC

## 2019-10-12 NOTE — Progress Notes (Signed)
[employer] registration completed and authorization obtained. Script fowarded to oral chemo specialist to process.

## 2019-10-12 NOTE — Progress Notes (Signed)
[place] OFFICE PROGRESS NOTE   Diagnosis: Multiple myeloma  INTERVAL HISTORY:   Mr. Deems returns as scheduled.  He is here with his wife.  He continues to have pain at the right sacrum.  The pain is relieved with oxycodone.  No other complaint. He has been evaluated at [facility] over the past week. A bone marrow biopsy on 10/01/2019 revealed scant hematopoietic tissue with approximately 5% plasma cells.  Flow cytometry did not reveal a clonal population. A PET scan 09/25/2019 revealed FDG uptake at S2 and S3, in the right humeral neck.  Overall similar size and distribution of remaining lytic lesions involving the cranium, axial, and appendicular skeleton without SUV above background liver.  He reports Dr. Melburn Hake recommends beginning treatment with daratumumab, pomalidomide, and Decadron. Objective:  Vital signs in last 24 hours:  Blood pressure 139/89, pulse 67, temperature 98.3 F (36.8 C), temperature source Temporal, resp. rate 17, height '6\' 2"'$  (1.88 m), weight 248 lb 8 oz (112.7 kg), SpO2 97 %.    Resp: Lungs clear bilaterally Cardio: Regular rate and rhythm GI: No hepatosplenomegaly Vascular: No leg edema Musculoskeletal: No tenderness at the spine or sacrum    Lab Results:  Lab Results  Component Value Date   WBC 6.5 10/12/2019   HGB 16.7 10/12/2019   HCT 48.6 10/12/2019   MCV 90.2 10/12/2019   PLT 187 10/12/2019   NEUTROABS 4.2 10/12/2019    CMP  Lab Results  Component Value Date   NA 138 10/12/2019   K 3.9 10/12/2019   CL 101 10/12/2019   CO2 23 10/12/2019   GLUCOSE 145 (H) 10/12/2019   BUN 14 10/12/2019   CREATININE 1.64 (H) 10/12/2019   CALCIUM 8.5 (L) 10/12/2019   PROT 7.4 10/12/2019   ALBUMIN 4.2 10/12/2019   AST 36 10/12/2019   ALT 74 (H) 10/12/2019   ALKPHOS 56 10/12/2019   BILITOT 0.6 10/12/2019   GFRNONAA 47 (L) 10/12/2019   GFRAA 55 (L) 10/12/2019     Medications: I have reviewed the patient's  current medications.   Assessment/Plan: 1.Multiple myeloma-IgG lambda serum monoclonal protein, elevated serum lambda light chains  Bone survey 10/11/2017-lytic lesions noted in the skull, right iliac, left second rib, and mottled appearance of the proximal femurs/pelvis  Bone marrow biopsy 10/14/2017-hypercellular marrow with plasma cell neoplasm, 82% plasma cells, lambda light chain restricted, hyperdiploid with gains of chromosomes 3, 5, 7, 9, and 11. FISH panel positive for gain of ATM(+11)  Cycle 1 RVD 10/18/2017 (Revlimid started 10/23/2017)  Cycle 2 RVD 11/15/2017  Cycle 3 RVD 12/13/2017  Cycle 4 RVD 01/14/2018  Cycle 5 of RVD 02/14/2018 (Revlimid given for 7 days and 1 week of Velcade), therapy held beginning 02/21/2018 secondary to plan for stem cell therapy  Bone marrow biopsy 02/20/2018, 1-2% plasma cell  PET scan 02/20/2018, no malignant range activity above background, numerous lytic lesions throughout the axial and appendicular skeleton,, left iliac wing fracture  Melphalan, 200 mg/m on 03/24/2018  Stem cell infusion 03/25/2018  Restaging at [facility] 07/09/2018: Normal lambda light chains, no serum M spike, bone marrow biopsy negative for myeloma, less than 1% plasma cells  PET scan at [facility] 07/09/2018-lytic bone lesions, no malignant range activity  Initiation of maintenance Revlimid, 10 mg, 21/28 days 08/15/2018  Cycle 2 maintenance Revlimid 09/12/2018  Cycle 3 maintenance Revlimid 10/10/2018  Revlimid placed on hold 06/15/2019 due to presyncopal/syncopal episodes and diarrhea  Revlimid resumed 5 mg 21 days on/7 days off  following office visit 06/29/2019  Revlimidplaced on hold 07/14/2019  Revlimid resumed 08/21/2019  Bone survey 09/09/2019- no acute findings or clear explanation for right buttock pain.  Lucent lesions in the right pelvis are stable without pathologic fracture.  Evidence of healing lytic lesions in the right scapula, cervical spine spinous  processes and left L4 transverse process.  Possible mild progression of lytic lesions within the L1 and L4 vertebral bodies.  Stable lytic lesions in the calvarium.  PET scan 09/25/2019-new FDG avid bone lesions in the right humeral neck and sacrum similar remaining lytic lesions with FDG activity below background  Bone marrow biopsy at [facility] on 10/01/2019-5% plasma cells on the bone marrow biopsy suboptimal sample   2.Pain secondary to multiple myeloma involving the spine and pelvis-resolved  MRI of the lumbar spine 10/02/2018-numerous rounded foci in the vertebral bodies, hypertrophy of the L4 transverse process  3.Hypertension-losartan dose increased 11/15/2017  4.Depression-improved with Wellbutrin beginning September 2020  5.Altered mental status-improved depression related?     Disposition: Mr. Strickling has clinical, laboratory, and x-ray evidence of progressive myeloma.  He and his wife report Dr. Stacie Glaze has recommended daratumumab, pomalidomide, and Decadron.  I will confirm this recommendation with Dr. Stacie Glaze.  We reviewed potential toxicities associated with these agents including the chance of an allergic reaction, hematologic toxicity, neuropathy, diarrhea, infection, respiratory symptoms, increased risk of venous thrombosis and interference with blood typing.  He agrees to proceed.  The creatinine is higher today.  He will contact his cardiologist to discuss the indication for holding valsartan.  We will check a creatinine level when he returns for treatment next week.  Zometa will be held today secondary to the elevated creatinine.  We will follow-up on the baseline myeloma panel from today.  40 minutes were spent with the patient today.  The majority of the time was used for counseling and coordination of care.    Betsy Coder, MD  10/12/2019  9:19 AM

## 2019-10-12 NOTE — Progress Notes (Signed)
Per Manuela Schwartz, RN: Dr. Benay Spice is holding treatment today due to elevated Scr. of 1.64. Patient informed and agreeable.

## 2019-10-13 LAB — PROTEIN ELECTROPHORESIS, SERUM
A/G Ratio: 1.3 (ref 0.7–1.7)
Albumin ELP: 3.9 g/dL (ref 2.9–4.4)
Alpha-1-Globulin: 0.2 g/dL (ref 0.0–0.4)
Alpha-2-Globulin: 0.7 g/dL (ref 0.4–1.0)
Beta Globulin: 1.1 g/dL (ref 0.7–1.3)
Gamma Globulin: 1 g/dL (ref 0.4–1.8)
Globulin, Total: 3 g/dL (ref 2.2–3.9)
M-Spike, %: 0.3 g/dL — ABNORMAL HIGH
Total Protein ELP: 6.9 g/dL (ref 6.0–8.5)

## 2019-10-13 LAB — KAPPA/LAMBDA LIGHT CHAINS
Kappa free light chain: 26.8 mg/L — ABNORMAL HIGH (ref 3.3–19.4)
Kappa, lambda light chain ratio: 0.3 (ref 0.26–1.65)
Lambda free light chains: 90 mg/L — ABNORMAL HIGH (ref 5.7–26.3)

## 2019-10-14 ENCOUNTER — Other Ambulatory Visit: Payer: Self-pay | Admitting: *Deleted

## 2019-10-14 ENCOUNTER — Telehealth: Payer: Self-pay | Admitting: Pharmacist

## 2019-10-14 ENCOUNTER — Encounter: Payer: Self-pay | Admitting: Oncology

## 2019-10-14 MED ORDER — DEXAMETHASONE 4 MG PO TABS: ORAL_TABLET | ORAL | 1 refills | Status: DC

## 2019-10-14 MED ORDER — ACYCLOVIR 400 MG PO TABS: 400.0000 mg | ORAL_TABLET | Freq: Two times a day (BID) | ORAL | 3 refills | Status: DC

## 2019-10-14 MED ORDER — PROCHLORPERAZINE MALEATE 10 MG PO TABS: 10.0000 mg | ORAL_TABLET | Freq: Four times a day (QID) | ORAL | 1 refills | Status: DC | PRN

## 2019-10-14 MED ORDER — ONDANSETRON HCL 8 MG PO TABS: 8.0000 mg | ORAL_TABLET | Freq: Three times a day (TID) | ORAL | 1 refills | Status: DC | PRN

## 2019-10-14 NOTE — Telephone Encounter (Signed)
Informed wife of scripts for acyclovir, dexamethasone, prochlorperazine and ondansetron sent to pharmacy with instructions on how to take and why. They have not yet heard about the pomalidomide cost. Informed her of special lab testing that needs to occur prior to the daratumumab dose and that this may set his chemo appointment back 30 minutes or so on 11/16--explained reason for this testing as well. Scheduler will be calling with new time.

## 2019-10-14 NOTE — Telephone Encounter (Signed)
Oral Oncology Pharmacist Encounter  Insurance authorization submitted to [employer] and [employer] on Cover My Meds It was unclear where patient has coverage as insurance card is BCBS of [place] and eligibility could not be determined by plan with information on card.  [employer] Medicare Key: [id number] Status: pending  [employer] Key: [id number] Status: pending  This encounter will continue to be updated until final determination.  Steve Carter, PharmD, BCPS, BCOP  10/14/2019   11:26 AM Oral Oncology Clinic [phone]

## 2019-10-15 ENCOUNTER — Telehealth: Payer: Self-pay | Admitting: Pharmacist

## 2019-10-15 ENCOUNTER — Encounter: Payer: Self-pay | Admitting: Oncology

## 2019-10-15 DIAGNOSIS — C9000 Multiple myeloma not having achieved remission: Secondary | ICD-10-CM

## 2019-10-15 MED ORDER — POMALIDOMIDE 3 MG PO CAPS: 3.0000 mg | ORAL_CAPSULE | Freq: Every day | ORAL | 0 refills | Status: DC

## 2019-10-15 NOTE — Telephone Encounter (Signed)
Oral Oncology Pharmacist Encounter  Received new prescription for Pomalyst (pomalidomide) for the treatment of multiple myeloma in relapse in conjunction with daratumumab and dexamethasone, planned duration until disease progression or unacceptable toxicity.  Pomalidomide is planned to be initiated at 97m by mouth once daily for 21 days on, 7 days off, repeated every 28 days Planned start date 10/19/19  Labs from 10/12/19 assessed, OK for treatment initiation. SCr=1.64, est CrCl ~ 50 mL/min No dose adjustment per manufacturer for CrCl > 45 mL/min, noted slight dose reduction at this time for increased tolerance  Current medication list in Epic reviewed, no significant DDIs with pomalidomide identified.  Noted acyclovir for VZV prophylaxis and aspirin for thromboprophylaxis are on patient's medication list. I will ensure patient is on both of these medications and reinforce their importance.  [place]. [facility].  Supporting information including insurance information, demographic information, medication list, and insurance authorization has been faxed to dispensing pharmacy.   Oral Oncology Clinic will continue to follow for copayment issues, initial counseling and start date.  JJohny Drilling PharmD, BCPS, BCOP  10/15/2019 8:33 AM Oral Oncology Clinic [phone]

## 2019-10-15 NOTE — Telephone Encounter (Signed)
Oral Oncology Pharmacist Encounter  Insurance authorization submitted to [employer] on Cover My Meds  Key: [id number] Status: approved Ref#: [id number] Effective dates: 10/14/19 - 10/13/2020  Johny Drilling, PharmD, BCPS, BCOP  10/15/2019   8:58 AM Oral Oncology Clinic [phone]

## 2019-10-16 ENCOUNTER — Telehealth: Payer: Self-pay | Admitting: Nurse Practitioner

## 2019-10-16 NOTE — Telephone Encounter (Signed)
Oral Chemotherapy Pharmacist Encounter   I spoke with patient's wife, Olivia Mackie, for overview of: Pomalyst (pomalidomide) for the treatment of relapsed multiple myeloma in conjunction with daratumumab and dexamethasone, planned duration until disease progression or unacceptable toxicity.   Counseled on administration, dosing, side effects, monitoring, drug-food interactions, safe handling, storage, and disposal.  Patient will take Pomalyst '3mg'$  capsules, 1 capsule by mouth once daily, without regard to food, with a full glass of water.  Pomalyst will be given 21 days on, 7 days off, repeat every 28 days.  Patient will take dexamethasone '4mg'$  tablets, 5 tablets ('20mg'$ ) by mouth once weekly with breakfast the day after daratumumab.  Pomalyst and daratumumab start date: 10/19/2019  Adverse effects of Pomalyst include but are not limited to: nausea, constipation, diarrhea, abdominal pain, rash, fatigue, drug fever, peripheral edema, and decreased blood counts.    Reviewed importance of keeping a medication schedule and plan for any missed doses.  Medication reconciliation performed and medication/allergy list updated.  Patient has started acyclovir 400 mg BID for VZV prophylaxis and daily aspirin '81mg'$  for VTE prophylaxis.  Insurance authorization for [employer] has been obtained.  Pomalyst prescription is being dispensed from [place] as it is a limited distribution medication. Olivia Mackie provided phone number of dispensing pharmacy of [phone] and will call them now to schedule 1st shipment of Pomalyst.  All questions answered.  Mrs. [place] voiced understanding and appreciation.   They know to call the office with questions or concerns.  Johny Drilling, PharmD, BCPS, BCOP  10/16/2019   9:38 AM Oral Oncology Clinic [phone]

## 2019-10-16 NOTE — Telephone Encounter (Signed)
R/s appt per 11/13 sch message - unable to reach pt. Left message with new appt date and time

## 2019-10-18 ENCOUNTER — Other Ambulatory Visit: Payer: Self-pay | Admitting: Oncology

## 2019-10-19 ENCOUNTER — Inpatient Hospital Stay: Payer: BC Managed Care – PPO

## 2019-10-19 ENCOUNTER — Other Ambulatory Visit: Payer: Self-pay

## 2019-10-19 ENCOUNTER — Other Ambulatory Visit: Payer: Self-pay | Admitting: Oncology

## 2019-10-19 VITALS — BP 161/89 | HR 63 | Temp 98.3°F | Resp 16 | Ht 74.0 in | Wt 252.5 lb

## 2019-10-19 DIAGNOSIS — C9000 Multiple myeloma not having achieved remission: Secondary | ICD-10-CM

## 2019-10-19 LAB — BASIC METABOLIC PANEL - CANCER CENTER ONLY
Anion gap: 12 (ref 5–15)
BUN: 11 mg/dL (ref 6–20)
CO2: 23 mmol/L (ref 22–32)
Calcium: 8.4 mg/dL — ABNORMAL LOW (ref 8.9–10.3)
Chloride: 104 mmol/L (ref 98–111)
Creatinine: 1.16 mg/dL (ref 0.61–1.24)
GFR, Est AFR Am: >60 mL/min (ref >60)
GFR, Estimated: >60 mL/min (ref >60)
Glucose, Bld: 147 mg/dL — ABNORMAL HIGH (ref 70–99)
Potassium: 4 mmol/L (ref 3.5–5.1)
Sodium: 139 mmol/L (ref 135–145)

## 2019-10-19 LAB — TYPE AND SCREEN
ABO/RH(D): O POS
Antibody Screen: NEGATIVE

## 2019-10-19 LAB — PRETREATMENT RBC PHENOTYPE

## 2019-10-19 MED ORDER — DARATUMUMAB-HYALURONIDASE-FIHJ 1800-30000 MG-UT/15ML ~~LOC~~ SOLN
1800.0000 mg | Freq: Once | SUBCUTANEOUS | Status: AC
  Administered 2019-10-19: 1800 mg via SUBCUTANEOUS
  Filled 2019-10-19: qty 15

## 2019-10-19 MED ORDER — MONTELUKAST SODIUM 10 MG PO TABS
ORAL_TABLET | ORAL | Status: AC
  Filled 2019-10-19: qty 1

## 2019-10-19 MED ORDER — ZOLEDRONIC ACID 4 MG/100ML IV SOLN
4.0000 mg | Freq: Once | INTRAVENOUS | Status: AC
  Administered 2019-10-19: 13:00:00 4 mg via INTRAVENOUS
  Filled 2019-10-19: qty 100

## 2019-10-19 MED ORDER — MONTELUKAST SODIUM 10 MG PO TABS
10.0000 mg | ORAL_TABLET | Freq: Once | ORAL | Status: AC
  Administered 2019-10-19: 10 mg via ORAL

## 2019-10-19 MED ORDER — DEXAMETHASONE 4 MG PO TABS
ORAL_TABLET | ORAL | Status: AC
  Filled 2019-10-19: qty 5

## 2019-10-19 MED ORDER — DIPHENHYDRAMINE HCL 25 MG PO CAPS
ORAL_CAPSULE | ORAL | Status: AC
  Filled 2019-10-19: qty 2

## 2019-10-19 MED ORDER — ACETAMINOPHEN 325 MG PO TABS
ORAL_TABLET | ORAL | Status: AC
  Filled 2019-10-19: qty 2

## 2019-10-19 MED ORDER — DEXAMETHASONE 4 MG PO TABS
20.0000 mg | ORAL_TABLET | Freq: Once | ORAL | Status: AC
  Administered 2019-10-19: 20 mg via ORAL

## 2019-10-19 MED ORDER — DIPHENHYDRAMINE HCL 25 MG PO CAPS
50.0000 mg | ORAL_CAPSULE | Freq: Once | ORAL | Status: AC
  Administered 2019-10-19: 50 mg via ORAL

## 2019-10-19 MED ORDER — ACETAMINOPHEN 325 MG PO TABS
650.0000 mg | ORAL_TABLET | Freq: Once | ORAL | Status: AC
  Administered 2019-10-19: 650 mg via ORAL

## 2019-10-19 NOTE — Patient Instructions (Signed)
[facility] Discharge Instructions for Patients Receiving Chemotherapy  Today you received the following chemotherapy agents :  Daratumumab.  To help prevent nausea and vomiting after your treatment, we encourage you to take your nausea medication as prescribed.   If you develop nausea and vomiting that is not controlled by your nausea medication, call the clinic.   BELOW ARE SYMPTOMS THAT SHOULD BE REPORTED IMMEDIATELY:  *FEVER GREATER THAN 100.5 F  *CHILLS WITH OR WITHOUT FEVER  NAUSEA AND VOMITING THAT IS NOT CONTROLLED WITH YOUR NAUSEA MEDICATION  *UNUSUAL SHORTNESS OF BREATH  *UNUSUAL BRUISING OR BLEEDING  TENDERNESS IN MOUTH AND THROAT WITH OR WITHOUT PRESENCE OF ULCERS  *URINARY PROBLEMS  *BOWEL PROBLEMS  UNUSUAL RASH Items with * indicate a potential emergency and should be followed up as soon as possible.  Feel free to call the clinic should you have any questions or concerns. The clinic phone number is [phone].  Please show the CHEMO ALERT CARD at check-in to the Emergency Department and triage nurse.   

## 2019-10-19 NOTE — Progress Notes (Signed)
Give Zometa today with Ca = 8.4 and SCr=1.1 per Dr Benay Spice

## 2019-10-19 NOTE — Progress Notes (Signed)
Per Dr Benay Spice OK to proceed with D1C1 of daratumumab with CBC from 10/12/19

## 2019-10-20 ENCOUNTER — Telehealth: Payer: Self-pay | Admitting: *Deleted

## 2019-10-20 ENCOUNTER — Encounter: Payer: Self-pay | Admitting: Oncology

## 2019-10-20 NOTE — Telephone Encounter (Signed)
Oral Oncology Pharmacist Encounter  I confirmed with [facility] that Pomalyst prescription was delivered to patient on 10/17/2019. Copayment $75 Pharmacy is looking into rebilling this with a copayment coupon card to reduce copayment to $25.  Johny Drilling, PharmD, BCPS, BCOP  10/20/2019 11:51 AM Oral Oncology Clinic [phone]

## 2019-10-20 NOTE — Telephone Encounter (Signed)
Left VM asking for status of documentation they requested for his July and April 2020 periods of being out of work. States she sent request on 10/09/19. Forwarded message to [place], [place] to f/u.

## 2019-10-21 ENCOUNTER — Other Ambulatory Visit: Payer: Self-pay | Admitting: Nurse Practitioner

## 2019-10-21 ENCOUNTER — Encounter: Payer: Self-pay | Admitting: Oncology

## 2019-10-21 DIAGNOSIS — C9000 Multiple myeloma not having achieved remission: Secondary | ICD-10-CM

## 2019-10-21 MED ORDER — OXYCODONE-ACETAMINOPHEN 5-325 MG PO TABS: 1.0000 | ORAL_TABLET | Freq: Four times a day (QID) | ORAL | 0 refills | Status: DC | PRN

## 2019-10-23 ENCOUNTER — Telehealth: Payer: Self-pay | Admitting: Oncology

## 2019-10-23 ENCOUNTER — Encounter: Payer: Self-pay | Admitting: Oncology

## 2019-10-23 NOTE — Telephone Encounter (Signed)
R/s appt per 11/17 sch message - pt is aware of appt changes

## 2019-10-23 NOTE — Progress Notes (Signed)
Pt is enrolled in the [employer] program for [employer].  Pt is eligible to pay $5 per treatment with up to $20,000 per calendar year.

## 2019-10-26 ENCOUNTER — Inpatient Hospital Stay: Payer: BC Managed Care – PPO

## 2019-10-26 ENCOUNTER — Other Ambulatory Visit: Payer: Self-pay

## 2019-10-26 VITALS — BP 157/97 | HR 62 | Temp 98.4°F | Resp 18

## 2019-10-26 DIAGNOSIS — C9000 Multiple myeloma not having achieved remission: Secondary | ICD-10-CM

## 2019-10-26 LAB — CBC WITH DIFFERENTIAL (CANCER CENTER ONLY)
Abs Immature Granulocytes: 0.02 10*3/uL (ref 0.00–0.07)
Basophils Absolute: 0 10*3/uL (ref 0.0–0.1)
Basophils Relative: 0 %
Eosinophils Absolute: 0.2 10*3/uL (ref 0.0–0.5)
Eosinophils Relative: 2 %
HCT: 46.2 % (ref 39.0–52.0)
Hemoglobin: 16.3 g/dL (ref 13.0–17.0)
Immature Granulocytes: 0 %
Lymphocytes Relative: 7 %
Lymphs Abs: 0.5 10*3/uL — ABNORMAL LOW (ref 0.7–4.0)
MCH: 31.6 pg (ref 26.0–34.0)
MCHC: 35.3 g/dL (ref 30.0–36.0)
MCV: 89.5 fL (ref 80.0–100.0)
Monocytes Absolute: 0.3 10*3/uL (ref 0.1–1.0)
Monocytes Relative: 5 %
Neutro Abs: 5.6 10*3/uL (ref 1.7–7.7)
Neutrophils Relative %: 86 %
Platelet Count: 164 10*3/uL (ref 150–400)
RBC: 5.16 MIL/uL (ref 4.22–5.81)
RDW: 12.9 % (ref 11.5–15.5)
WBC Count: 6.6 10*3/uL (ref 4.0–10.5)
nRBC: 0 % (ref 0.0–0.2)

## 2019-10-26 LAB — CMP (CANCER CENTER ONLY)
ALT: 51 U/L — ABNORMAL HIGH (ref 0–44)
AST: 24 U/L (ref 15–41)
Albumin: 3.8 g/dL (ref 3.5–5.0)
Alkaline Phosphatase: 64 U/L (ref 38–126)
Anion gap: 13 (ref 5–15)
BUN: 16 mg/dL (ref 6–20)
CO2: 20 mmol/L — ABNORMAL LOW (ref 22–32)
Calcium: 8.5 mg/dL — ABNORMAL LOW (ref 8.9–10.3)
Chloride: 105 mmol/L (ref 98–111)
Creatinine: 1.2 mg/dL (ref 0.61–1.24)
GFR, Est AFR Am: >60 mL/min (ref >60)
GFR, Estimated: >60 mL/min (ref >60)
Glucose, Bld: 202 mg/dL — ABNORMAL HIGH (ref 70–99)
Potassium: 3.8 mmol/L (ref 3.5–5.1)
Sodium: 138 mmol/L (ref 135–145)
Total Bilirubin: 0.5 mg/dL (ref 0.3–1.2)
Total Protein: 7.1 g/dL (ref 6.5–8.1)

## 2019-10-26 MED ORDER — ACETAMINOPHEN 325 MG PO TABS
650.0000 mg | ORAL_TABLET | Freq: Once | ORAL | Status: AC
  Administered 2019-10-26: 650 mg via ORAL

## 2019-10-26 MED ORDER — DIPHENHYDRAMINE HCL 25 MG PO CAPS
50.0000 mg | ORAL_CAPSULE | Freq: Once | ORAL | Status: AC
  Administered 2019-10-26: 50 mg via ORAL

## 2019-10-26 MED ORDER — DARATUMUMAB-HYALURONIDASE-FIHJ 1800-30000 MG-UT/15ML ~~LOC~~ SOLN
1800.0000 mg | Freq: Once | SUBCUTANEOUS | Status: AC
  Administered 2019-10-26: 1800 mg via SUBCUTANEOUS
  Filled 2019-10-26: qty 15

## 2019-10-26 MED ORDER — DIPHENHYDRAMINE HCL 25 MG PO CAPS
ORAL_CAPSULE | ORAL | Status: AC
  Filled 2019-10-26: qty 2

## 2019-10-26 MED ORDER — ACETAMINOPHEN 325 MG PO TABS
ORAL_TABLET | ORAL | Status: AC
  Filled 2019-10-26: qty 2

## 2019-10-26 MED ORDER — DEXAMETHASONE 4 MG PO TABS
20.0000 mg | ORAL_TABLET | Freq: Once | ORAL | Status: AC
  Administered 2019-10-26: 20 mg via ORAL

## 2019-10-26 MED ORDER — DEXAMETHASONE 4 MG PO TABS
ORAL_TABLET | ORAL | Status: AC
  Filled 2019-10-26: qty 5

## 2019-10-26 NOTE — Patient Instructions (Addendum)
[facility] Discharge Instructions for Patients Receiving Chemotherapy  Today you received the following chemotherapy agents:  Daratumumab Injection  To help prevent nausea and vomiting after your treatment, we encourage you to take your nausea medication as prescribed.   If you develop nausea and vomiting that is not controlled by your nausea medication, call the clinic.   BELOW ARE SYMPTOMS THAT SHOULD BE REPORTED IMMEDIATELY:  *FEVER GREATER THAN 100.5 F  *CHILLS WITH OR WITHOUT FEVER  NAUSEA AND VOMITING THAT IS NOT CONTROLLED WITH YOUR NAUSEA MEDICATION  *UNUSUAL SHORTNESS OF BREATH  *UNUSUAL BRUISING OR BLEEDING  TENDERNESS IN MOUTH AND THROAT WITH OR WITHOUT PRESENCE OF ULCERS  *URINARY PROBLEMS  *BOWEL PROBLEMS  UNUSUAL RASH Items with * indicate a potential emergency and should be followed up as soon as possible.  Feel free to call the clinic should you have any questions or concerns. The clinic phone number is [phone].  Please show the [place] at check-in to the Emergency Department and triage nurse.

## 2019-10-30 ENCOUNTER — Ambulatory Visit: Payer: BC Managed Care – PPO | Admitting: Nurse Practitioner

## 2019-10-30 ENCOUNTER — Other Ambulatory Visit: Payer: BC Managed Care – PPO

## 2019-11-02 ENCOUNTER — Inpatient Hospital Stay: Payer: BC Managed Care – PPO

## 2019-11-02 ENCOUNTER — Ambulatory Visit: Payer: BC Managed Care – PPO

## 2019-11-02 ENCOUNTER — Other Ambulatory Visit: Payer: Self-pay

## 2019-11-02 ENCOUNTER — Inpatient Hospital Stay (HOSPITAL_BASED_OUTPATIENT_CLINIC_OR_DEPARTMENT_OTHER): Payer: BC Managed Care – PPO | Admitting: Oncology

## 2019-11-02 ENCOUNTER — Other Ambulatory Visit: Payer: Self-pay | Admitting: *Deleted

## 2019-11-02 VITALS — BP 146/96 | HR 72 | Temp 98.2°F | Resp 17 | Ht 74.0 in | Wt 245.4 lb

## 2019-11-02 DIAGNOSIS — C9000 Multiple myeloma not having achieved remission: Secondary | ICD-10-CM

## 2019-11-02 LAB — CBC WITH DIFFERENTIAL (CANCER CENTER ONLY)
Abs Immature Granulocytes: 0.03 10*3/uL (ref 0.00–0.07)
Basophils Absolute: 0.1 10*3/uL (ref 0.0–0.1)
Basophils Relative: 1 %
Eosinophils Absolute: 0.2 10*3/uL (ref 0.0–0.5)
Eosinophils Relative: 2 %
HCT: 46.4 % (ref 39.0–52.0)
Hemoglobin: 16.1 g/dL (ref 13.0–17.0)
Immature Granulocytes: 0 %
Lymphocytes Relative: 8 %
Lymphs Abs: 0.6 10*3/uL — ABNORMAL LOW (ref 0.7–4.0)
MCH: 30.8 pg (ref 26.0–34.0)
MCHC: 34.7 g/dL (ref 30.0–36.0)
MCV: 88.7 fL (ref 80.0–100.0)
Monocytes Absolute: 1.1 10*3/uL — ABNORMAL HIGH (ref 0.1–1.0)
Monocytes Relative: 15 %
Neutro Abs: 5.7 10*3/uL (ref 1.7–7.7)
Neutrophils Relative %: 74 %
Platelet Count: 124 10*3/uL — ABNORMAL LOW (ref 150–400)
RBC: 5.23 MIL/uL (ref 4.22–5.81)
RDW: 13 % (ref 11.5–15.5)
WBC Count: 7.7 10*3/uL (ref 4.0–10.5)
nRBC: 0 % (ref 0.0–0.2)

## 2019-11-02 LAB — CMP (CANCER CENTER ONLY)
ALT: 49 U/L — ABNORMAL HIGH (ref 0–44)
AST: 24 U/L (ref 15–41)
Albumin: 3.6 g/dL (ref 3.5–5.0)
Alkaline Phosphatase: 63 U/L (ref 38–126)
Anion gap: 11 (ref 5–15)
BUN: 14 mg/dL (ref 6–20)
CO2: 26 mmol/L (ref 22–32)
Calcium: 8.9 mg/dL (ref 8.9–10.3)
Chloride: 102 mmol/L (ref 98–111)
Creatinine: 0.91 mg/dL (ref 0.61–1.24)
GFR, Est AFR Am: >60 mL/min (ref >60)
GFR, Estimated: >60 mL/min (ref >60)
Glucose, Bld: 132 mg/dL — ABNORMAL HIGH (ref 70–99)
Potassium: 3.9 mmol/L (ref 3.5–5.1)
Sodium: 139 mmol/L (ref 135–145)
Total Bilirubin: 0.7 mg/dL (ref 0.3–1.2)
Total Protein: 6.6 g/dL (ref 6.5–8.1)

## 2019-11-02 MED ORDER — ACETAMINOPHEN 325 MG PO TABS
650.0000 mg | ORAL_TABLET | Freq: Once | ORAL | Status: AC
  Administered 2019-11-02: 650 mg via ORAL

## 2019-11-02 MED ORDER — DEXAMETHASONE 4 MG PO TABS
20.0000 mg | ORAL_TABLET | Freq: Once | ORAL | Status: AC
  Administered 2019-11-02: 20 mg via ORAL

## 2019-11-02 MED ORDER — POMALIDOMIDE 3 MG PO CAPS: 3.0000 mg | ORAL_CAPSULE | Freq: Every day | ORAL | 0 refills | Status: DC

## 2019-11-02 MED ORDER — SODIUM CHLORIDE 0.9% FLUSH
10.0000 mL | INTRAVENOUS | Status: DC | PRN
  Filled 2019-11-02: qty 10

## 2019-11-02 MED ORDER — DIPHENHYDRAMINE HCL 25 MG PO CAPS
ORAL_CAPSULE | ORAL | Status: AC
  Filled 2019-11-02: qty 2

## 2019-11-02 MED ORDER — DEXAMETHASONE 4 MG PO TABS
ORAL_TABLET | ORAL | Status: AC
  Filled 2019-11-02: qty 5

## 2019-11-02 MED ORDER — ACETAMINOPHEN 325 MG PO TABS
ORAL_TABLET | ORAL | Status: AC
  Filled 2019-11-02: qty 2

## 2019-11-02 MED ORDER — DARATUMUMAB-HYALURONIDASE-FIHJ 1800-30000 MG-UT/15ML ~~LOC~~ SOLN
1800.0000 mg | Freq: Once | SUBCUTANEOUS | Status: AC
  Administered 2019-11-02: 1800 mg via SUBCUTANEOUS
  Filled 2019-11-02: qty 15

## 2019-11-02 MED ORDER — DIPHENHYDRAMINE HCL 25 MG PO CAPS
50.0000 mg | ORAL_CAPSULE | Freq: Once | ORAL | Status: AC
  Administered 2019-11-02: 50 mg via ORAL

## 2019-11-02 NOTE — Progress Notes (Signed)
[place] [facility] OFFICE PROGRESS NOTE   Diagnosis: Multiple myeloma  INTERVAL HISTORY:   Steve Carter began treatment with pomalidomide, daratumumab, and Decadron on 10/19/2019.  He reports tolerating treatment well.  No symptom of an allergic reaction, cough, or dyspnea.  He had one episode of diarrhea this morning.  No consistent diarrhea.  Depression symptoms remain improved.  The right buttock pain has resolved.  He is no longer taking pain medication.  Objective:  Vital signs in last 24 hours:  Blood pressure (!) 146/96, pulse 72, temperature 98.2 F (36.8 C), temperature source Temporal, resp. rate 17, height 6' 2" (1.88 m), weight 245 lb 6.4 oz (111.3 kg), SpO2 98 %.   Physical examination not performed today secondary to distancing with the Covid pandemic  Lab Results:  Lab Results  Component Value Date   WBC 7.7 11/02/2019   HGB 16.1 11/02/2019   HCT 46.4 11/02/2019   MCV 88.7 11/02/2019   PLT 124 (L) 11/02/2019   NEUTROABS 5.7 11/02/2019    CMP  Lab Results  Component Value Date   NA 139 11/02/2019   K 3.9 11/02/2019   CL 102 11/02/2019   CO2 26 11/02/2019   GLUCOSE 132 (H) 11/02/2019   BUN 14 11/02/2019   CREATININE 0.91 11/02/2019   CALCIUM 8.9 11/02/2019   PROT 6.6 11/02/2019   ALBUMIN 3.6 11/02/2019   AST 24 11/02/2019   ALT 49 (H) 11/02/2019   ALKPHOS 63 11/02/2019   BILITOT 0.7 11/02/2019   GFRNONAA >60 11/02/2019   GFRAA >60 11/02/2019    Medications: I have reviewed the patient's current medications.   Assessment/Plan: 1.Multiple myeloma-IgG lambda serum monoclonal protein, elevated serum lambda light chains  Bone survey 10/11/2017-lytic lesions noted in the skull, right iliac, left second rib, and mottled appearance of the proximal femurs/pelvis  Bone marrow biopsy 10/14/2017-hypercellular marrow with plasma cell neoplasm, 82% plasma cells, lambda light chain restricted, hyperdiploid with gains of chromosomes 3, 5, 7, 9, and  11. FISH panel positive for gain of ATM(+11)  Cycle 1 RVD 10/18/2017 (Revlimid started 10/23/2017)  Cycle 2 RVD 11/15/2017  Cycle 3 RVD 12/13/2017  Cycle 4 RVD 01/14/2018  Cycle 5 of RVD 02/14/2018 (Revlimid given for 7 days and 1 week of Velcade), therapy held beginning 02/21/2018 secondary to plan for stem cell therapy  Bone marrow biopsy 02/20/2018, 1-2% plasma cell  PET scan 02/20/2018, no malignant range activity above background, numerous lytic lesions throughout the axial and appendicular skeleton,, left iliac wing fracture  Melphalan, 200 mg/m on 03/24/2018  Stem cell infusion 03/25/2018  Restaging at [facility] 07/09/2018: Normal lambda light chains, no serum M spike, bone marrow biopsy negative for myeloma, less than 1% plasma cells  PET scan at [facility] 07/09/2018-lytic bone lesions, no malignant range activity  Initiation of maintenance Revlimid, 10 mg, 21/28 days 08/15/2018  Cycle 2 maintenance Revlimid 09/12/2018  Cycle 3 maintenance Revlimid 10/10/2018  Revlimid placed on hold 06/15/2019 due to presyncopal/syncopal episodes and diarrhea  Revlimid resumed 5 mg 21 days on/7 days off following office visit 06/29/2019  Revlimidplaced on hold 07/14/2019  Revlimid resumed 08/21/2019  Bone survey 09/09/2019- no acute findings or clear explanation for right buttock pain.  Lucent lesions in the right pelvis are stable without pathologic fracture.  Evidence of healing lytic lesions in the right scapula, cervical spine spinous processes and left L4 transverse process.  Possible mild progression of lytic lesions within the L1 and L4 vertebral bodies.  Stable lytic lesions in  the calvarium.  PET scan 09/25/2019-new FDG avid bone lesions in the right humeral neck and sacrum similar remaining lytic lesions with FDG activity below background  Bone marrow biopsy at [facility] on 10/01/2019-5% plasma cells on the bone marrow biopsy suboptimal sample   Cycle 1 daratumumab,  pomalidomide, Decadron 10/19/2019  Zometa 10/19/2019   2.Pain secondary to multiple myeloma involving the spine and pelvis-resolved  MRI of the lumbar spine 10/02/2018-numerous rounded foci in the vertebral bodies, hypertrophy of the L4 transverse process  3.Hypertension-losartan dose increased 11/15/2017  4.Depression-improved with Wellbutrin beginning September 2020  5.Altered mental status-improved depression related?  Disposition: Steve Carter appears well.  He is tolerating the current treatment well.  He will continue pomalidomide and he will receive daratumumab today.  We will repeat a myeloma panel when he is here to begin cycle 2 on 11/16/2019.  He will continue every 13-monthZometa.  GBetsy Coder MD  11/02/2019  12:14 PM

## 2019-11-02 NOTE — Patient Instructions (Signed)
[facility] Discharge Instructions for Patients Receiving Chemotherapy  Today you received the following chemotherapy agents:  Daratumumab Injection  To help prevent nausea and vomiting after your treatment, we encourage you to take your nausea medication as prescribed.   If you develop nausea and vomiting that is not controlled by your nausea medication, call the clinic.   BELOW ARE SYMPTOMS THAT SHOULD BE REPORTED IMMEDIATELY:  *FEVER GREATER THAN 100.5 F  *CHILLS WITH OR WITHOUT FEVER  NAUSEA AND VOMITING THAT IS NOT CONTROLLED WITH YOUR NAUSEA MEDICATION  *UNUSUAL SHORTNESS OF BREATH  *UNUSUAL BRUISING OR BLEEDING  TENDERNESS IN MOUTH AND THROAT WITH OR WITHOUT PRESENCE OF ULCERS  *URINARY PROBLEMS  *BOWEL PROBLEMS  UNUSUAL RASH Items with * indicate a potential emergency and should be followed up as soon as possible.  Feel free to call the clinic should you have any questions or concerns. The clinic phone number is [phone].  Please show the [place] at check-in to the Emergency Department and triage nurse.

## 2019-11-02 NOTE — Progress Notes (Signed)
Confirmed w/his [facility] that he still had 1 refill on his dexamethasone. Requested they fill this today for him

## 2019-11-02 NOTE — Progress Notes (Signed)
Per Dr. Benay Spice, patient does not have to stay for post observation. Patient has not had any history of reaction after his first 2 injections.

## 2019-11-03 ENCOUNTER — Telehealth: Payer: Self-pay | Admitting: Oncology

## 2019-11-03 NOTE — Telephone Encounter (Signed)
Scheduled per los. Called and spoke with wife.confirmed appt

## 2019-11-09 ENCOUNTER — Telehealth: Payer: Self-pay | Admitting: *Deleted

## 2019-11-09 ENCOUNTER — Inpatient Hospital Stay: Payer: BC Managed Care – PPO

## 2019-11-09 ENCOUNTER — Other Ambulatory Visit: Payer: Self-pay

## 2019-11-09 ENCOUNTER — Inpatient Hospital Stay: Payer: BC Managed Care – PPO | Attending: Oncology

## 2019-11-09 VITALS — BP 148/91 | HR 70 | Temp 98.0°F | Resp 16 | Wt 248.5 lb

## 2019-11-09 DIAGNOSIS — R5383 Other fatigue: Secondary | ICD-10-CM | POA: Diagnosis not present

## 2019-11-09 DIAGNOSIS — R5381 Other malaise: Secondary | ICD-10-CM | POA: Insufficient documentation

## 2019-11-09 DIAGNOSIS — Z5112 Encounter for antineoplastic immunotherapy: Secondary | ICD-10-CM | POA: Insufficient documentation

## 2019-11-09 DIAGNOSIS — R4182 Altered mental status, unspecified: Secondary | ICD-10-CM | POA: Diagnosis not present

## 2019-11-09 DIAGNOSIS — G893 Neoplasm related pain (acute) (chronic): Secondary | ICD-10-CM | POA: Insufficient documentation

## 2019-11-09 DIAGNOSIS — F329 Major depressive disorder, single episode, unspecified: Secondary | ICD-10-CM | POA: Insufficient documentation

## 2019-11-09 DIAGNOSIS — C9000 Multiple myeloma not having achieved remission: Secondary | ICD-10-CM

## 2019-11-09 DIAGNOSIS — R102 Pelvic and perineal pain: Secondary | ICD-10-CM | POA: Diagnosis not present

## 2019-11-09 DIAGNOSIS — Z79899 Other long term (current) drug therapy: Secondary | ICD-10-CM | POA: Diagnosis not present

## 2019-11-09 DIAGNOSIS — R197 Diarrhea, unspecified: Secondary | ICD-10-CM | POA: Diagnosis not present

## 2019-11-09 DIAGNOSIS — I10 Essential (primary) hypertension: Secondary | ICD-10-CM | POA: Insufficient documentation

## 2019-11-09 LAB — CBC WITH DIFFERENTIAL (CANCER CENTER ONLY)
Abs Immature Granulocytes: 0.02 10*3/uL (ref 0.00–0.07)
Basophils Absolute: 0 10*3/uL (ref 0.0–0.1)
Basophils Relative: 1 %
Eosinophils Absolute: 0.2 10*3/uL (ref 0.0–0.5)
Eosinophils Relative: 6 %
HCT: 46.5 % (ref 39.0–52.0)
Hemoglobin: 16.4 g/dL (ref 13.0–17.0)
Immature Granulocytes: 1 %
Lymphocytes Relative: 13 %
Lymphs Abs: 0.4 10*3/uL — ABNORMAL LOW (ref 0.7–4.0)
MCH: 31.3 pg (ref 26.0–34.0)
MCHC: 35.3 g/dL (ref 30.0–36.0)
MCV: 88.7 fL (ref 80.0–100.0)
Monocytes Absolute: 0.6 10*3/uL (ref 0.1–1.0)
Monocytes Relative: 17 %
Neutro Abs: 2 10*3/uL (ref 1.7–7.7)
Neutrophils Relative %: 62 %
Platelet Count: 120 10*3/uL — ABNORMAL LOW (ref 150–400)
RBC: 5.24 MIL/uL (ref 4.22–5.81)
RDW: 12.8 % (ref 11.5–15.5)
WBC Count: 3.2 10*3/uL — ABNORMAL LOW (ref 4.0–10.5)
nRBC: 0 % (ref 0.0–0.2)

## 2019-11-09 MED ORDER — DIPHENHYDRAMINE HCL 25 MG PO CAPS
ORAL_CAPSULE | ORAL | Status: AC
  Filled 2019-11-09: qty 2

## 2019-11-09 MED ORDER — DEXAMETHASONE 4 MG PO TABS
ORAL_TABLET | ORAL | Status: AC
  Filled 2019-11-09: qty 5

## 2019-11-09 MED ORDER — DARATUMUMAB-HYALURONIDASE-FIHJ 1800-30000 MG-UT/15ML ~~LOC~~ SOLN
1800.0000 mg | Freq: Once | SUBCUTANEOUS | Status: AC
  Administered 2019-11-09: 1800 mg via SUBCUTANEOUS
  Filled 2019-11-09: qty 15

## 2019-11-09 MED ORDER — DEXAMETHASONE 4 MG PO TABS
20.0000 mg | ORAL_TABLET | Freq: Once | ORAL | Status: AC
  Administered 2019-11-09: 20 mg via ORAL

## 2019-11-09 MED ORDER — ACETAMINOPHEN 325 MG PO TABS
ORAL_TABLET | ORAL | Status: AC
  Filled 2019-11-09: qty 2

## 2019-11-09 MED ORDER — DIPHENHYDRAMINE HCL 25 MG PO CAPS
50.0000 mg | ORAL_CAPSULE | Freq: Once | ORAL | Status: AC
  Administered 2019-11-09: 50 mg via ORAL

## 2019-11-09 MED ORDER — ACETAMINOPHEN 325 MG PO TABS
650.0000 mg | ORAL_TABLET | Freq: Once | ORAL | Status: AC
  Administered 2019-11-09: 650 mg via ORAL

## 2019-11-09 NOTE — Telephone Encounter (Signed)
Connected with Telford Nab in reference to [employer] life/disability claim: [id number] for 03/14/2019 - 05/19/2019.  "Out of work as Dr. Norma Fredrickson instructed with COVID.  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

## 2019-11-09 NOTE — Patient Instructions (Signed)
[facility] Discharge Instructions for Patients Receiving Chemotherapy  Today you received the following chemotherapy agents:  Daratumumab Injection  To help prevent nausea and vomiting after your treatment, we encourage you to take your nausea medication as prescribed.   If you develop nausea and vomiting that is not controlled by your nausea medication, call the clinic.   BELOW ARE SYMPTOMS THAT SHOULD BE REPORTED IMMEDIATELY:  *FEVER GREATER THAN 100.5 F  *CHILLS WITH OR WITHOUT FEVER  NAUSEA AND VOMITING THAT IS NOT CONTROLLED WITH YOUR NAUSEA MEDICATION  *UNUSUAL SHORTNESS OF BREATH  *UNUSUAL BRUISING OR BLEEDING  TENDERNESS IN MOUTH AND THROAT WITH OR WITHOUT PRESENCE OF ULCERS  *URINARY PROBLEMS  *BOWEL PROBLEMS  UNUSUAL RASH Items with * indicate a potential emergency and should be followed up as soon as possible.  Feel free to call the clinic should you have any questions or concerns. The clinic phone number is [phone].  Please show the [place] at check-in to the Emergency Department and triage nurse.

## 2019-11-12 ENCOUNTER — Encounter: Payer: Self-pay | Admitting: Oncology

## 2019-11-13 ENCOUNTER — Encounter: Payer: Self-pay | Admitting: Oncology

## 2019-11-15 ENCOUNTER — Other Ambulatory Visit: Payer: Self-pay | Admitting: Oncology

## 2019-11-16 ENCOUNTER — Inpatient Hospital Stay: Payer: BC Managed Care – PPO | Admitting: Nurse Practitioner

## 2019-11-16 ENCOUNTER — Inpatient Hospital Stay: Payer: BC Managed Care – PPO

## 2019-11-16 ENCOUNTER — Encounter: Payer: Self-pay | Admitting: Oncology

## 2019-11-16 ENCOUNTER — Encounter: Payer: Self-pay | Admitting: Nurse Practitioner

## 2019-11-16 ENCOUNTER — Other Ambulatory Visit: Payer: Self-pay

## 2019-11-16 ENCOUNTER — Other Ambulatory Visit: Payer: Self-pay | Admitting: Oncology

## 2019-11-16 VITALS — BP 123/74 | HR 69 | Temp 97.5°F | Resp 18 | Ht 74.0 in | Wt 246.0 lb

## 2019-11-16 DIAGNOSIS — C9000 Multiple myeloma not having achieved remission: Secondary | ICD-10-CM | POA: Diagnosis not present

## 2019-11-16 LAB — CMP (CANCER CENTER ONLY)
ALT: 41 U/L (ref 0–44)
AST: 19 U/L (ref 15–41)
Albumin: 3.7 g/dL (ref 3.5–5.0)
Alkaline Phosphatase: 67 U/L (ref 38–126)
Anion gap: 13 (ref 5–15)
BUN: 14 mg/dL (ref 6–20)
CO2: 20 mmol/L — ABNORMAL LOW (ref 22–32)
Calcium: 8.5 mg/dL — ABNORMAL LOW (ref 8.9–10.3)
Chloride: 101 mmol/L (ref 98–111)
Creatinine: 1.18 mg/dL (ref 0.61–1.24)
GFR, Est AFR Am: >60 mL/min (ref >60)
GFR, Estimated: >60 mL/min (ref >60)
Glucose, Bld: 269 mg/dL — ABNORMAL HIGH (ref 70–99)
Potassium: 4 mmol/L (ref 3.5–5.1)
Sodium: 134 mmol/L — ABNORMAL LOW (ref 135–145)
Total Bilirubin: 0.5 mg/dL (ref 0.3–1.2)
Total Protein: 6.3 g/dL — ABNORMAL LOW (ref 6.5–8.1)

## 2019-11-16 LAB — CBC WITH DIFFERENTIAL (CANCER CENTER ONLY)
Abs Immature Granulocytes: 0.05 10*3/uL (ref 0.00–0.07)
Basophils Absolute: 0.1 10*3/uL (ref 0.0–0.1)
Basophils Relative: 1 %
Eosinophils Absolute: 0 10*3/uL (ref 0.0–0.5)
Eosinophils Relative: 1 %
HCT: 50.5 % (ref 39.0–52.0)
Hemoglobin: 17.4 g/dL — ABNORMAL HIGH (ref 13.0–17.0)
Immature Granulocytes: 1 %
Lymphocytes Relative: 19 %
Lymphs Abs: 0.9 10*3/uL (ref 0.7–4.0)
MCH: 31.1 pg (ref 26.0–34.0)
MCHC: 34.5 g/dL (ref 30.0–36.0)
MCV: 90.2 fL (ref 80.0–100.0)
Monocytes Absolute: 0.9 10*3/uL (ref 0.1–1.0)
Monocytes Relative: 18 %
Neutro Abs: 3 10*3/uL (ref 1.7–7.7)
Neutrophils Relative %: 60 %
Platelet Count: 181 10*3/uL (ref 150–400)
RBC: 5.6 MIL/uL (ref 4.22–5.81)
RDW: 13.3 % (ref 11.5–15.5)
WBC Count: 4.9 10*3/uL (ref 4.0–10.5)
nRBC: 0 % (ref 0.0–0.2)

## 2019-11-16 MED ORDER — ACETAMINOPHEN 325 MG PO TABS
650.0000 mg | ORAL_TABLET | Freq: Once | ORAL | Status: AC
  Administered 2019-11-16: 650 mg via ORAL

## 2019-11-16 MED ORDER — ACETAMINOPHEN 325 MG PO TABS
ORAL_TABLET | ORAL | Status: AC
  Filled 2019-11-16: qty 2

## 2019-11-16 MED ORDER — DEXAMETHASONE 4 MG PO TABS
20.0000 mg | ORAL_TABLET | Freq: Once | ORAL | Status: AC
  Administered 2019-11-16: 20 mg via ORAL

## 2019-11-16 MED ORDER — DEXAMETHASONE 4 MG PO TABS
ORAL_TABLET | ORAL | Status: AC
  Filled 2019-11-16: qty 5

## 2019-11-16 MED ORDER — DIPHENHYDRAMINE HCL 25 MG PO CAPS
ORAL_CAPSULE | ORAL | Status: AC
  Filled 2019-11-16: qty 21

## 2019-11-16 MED ORDER — DIPHENHYDRAMINE HCL 25 MG PO CAPS
50.0000 mg | ORAL_CAPSULE | Freq: Once | ORAL | Status: AC
  Administered 2019-11-16: 50 mg via ORAL

## 2019-11-16 MED ORDER — DARATUMUMAB-HYALURONIDASE-FIHJ 1800-30000 MG-UT/15ML ~~LOC~~ SOLN
1800.0000 mg | Freq: Once | SUBCUTANEOUS | Status: AC
  Administered 2019-11-16: 1800 mg via SUBCUTANEOUS
  Filled 2019-11-16: qty 15

## 2019-11-16 NOTE — Patient Instructions (Signed)
[facility] Discharge Instructions for Patients Receiving Chemotherapy  Today you received the following chemotherapy agents:  Daratumumab Injection  To help prevent nausea and vomiting after your treatment, we encourage you to take your nausea medication as prescribed.   If you develop nausea and vomiting that is not controlled by your nausea medication, call the clinic.   BELOW ARE SYMPTOMS THAT SHOULD BE REPORTED IMMEDIATELY:  *FEVER GREATER THAN 100.5 F  *CHILLS WITH OR WITHOUT FEVER  NAUSEA AND VOMITING THAT IS NOT CONTROLLED WITH YOUR NAUSEA MEDICATION  *UNUSUAL SHORTNESS OF BREATH  *UNUSUAL BRUISING OR BLEEDING  TENDERNESS IN MOUTH AND THROAT WITH OR WITHOUT PRESENCE OF ULCERS  *URINARY PROBLEMS  *BOWEL PROBLEMS  UNUSUAL RASH Items with * indicate a potential emergency and should be followed up as soon as possible.  Feel free to call the clinic should you have any questions or concerns. The clinic phone number is [phone].  Please show the [place] at check-in to the Emergency Department and triage nurse.

## 2019-11-16 NOTE — Progress Notes (Signed)
[place] OFFICE PROGRESS NOTE   Diagnosis: Multiple myeloma  INTERVAL HISTORY:   Steve Carter returns as scheduled.  He completed cycle 1 daratumumab, pomalidomide, dexamethasone 11/09/2019.  He denies nausea/vomiting.  No mouth sores.  No diarrhea.  No rash.  He is no longer having pain.  Main complaint is fatigue.  He feels like the treatment is "draining" his energy.  Objective:  Vital signs in last 24 hours:  Blood pressure 123/74, pulse 69, temperature (!) 97.5 F (36.4 C), temperature source Temporal, resp. rate 18, height _0  (1.88 m), weight 246 lb (111.6 kg), SpO2 97 %.    HEENT: No thrush or ulcers. GI: Abdomen soft and nontender.  No hepatosplenomegaly. Vascular: No leg edema. Neuro: Alert and oriented. Skin: No rash.   Lab Results:  Lab Results  Component Value Date   WBC 4.9 11/16/2019   HGB 17.4 (H) 11/16/2019   HCT 50.5 11/16/2019   MCV 90.2 11/16/2019   PLT 181 11/16/2019   NEUTROABS 3.0 11/16/2019    Imaging:  No results found.  Medications: I have reviewed the patient's current medications.  Assessment/Plan: 1.Multiple myeloma-IgG lambda serum monoclonal protein, elevated serum lambda light chains  Bone survey 10/11/2017-lytic lesions noted in the skull, right iliac, left second rib, and mottled appearance of the proximal femurs/pelvis  Bone marrow biopsy 10/14/2017-hypercellular marrow with plasma cell neoplasm, 82% plasma cells, lambda light chain restricted, hyperdiploid with gains of chromosomes 3, 5, 7, 9, and 11. FISH panel positive for gain of ATM(+11)  Cycle 1 RVD 10/18/2017 (Revlimid started 10/23/2017)  Cycle 2 RVD 11/15/2017  Cycle 3 RVD 12/13/2017  Cycle 4 RVD 01/14/2018  Cycle 5 of RVD 02/14/2018 (Revlimid given for 7 days and 1 week of Velcade), therapy held beginning 02/21/2018 secondary to plan for stem cell therapy  Bone marrow biopsy 02/20/2018, 1-2% plasma cell  PET scan 02/20/2018, no malignant  range activity above background, numerous lytic lesions throughout the axial and appendicular skeleton,, left iliac wing fracture  Melphalan, 200 mg/m on 03/24/2018  Stem cell infusion 03/25/2018  Restaging at [facility] 07/09/2018: Normal lambda light chains, no serum M spike, bone marrow biopsy negative for myeloma, less than 1% plasma cells  [facility] scan at [facility] 07/09/2018-lytic bone lesions, no malignant range activity  Initiation of maintenance Revlimid, 10 mg, 21/28 days 08/15/2018  Cycle 2 maintenance Revlimid 09/12/2018  Cycle 3 maintenance Revlimid 10/10/2018  Revlimid placed on hold 06/15/2019 due to presyncopal/syncopal episodes and diarrhea  Revlimid resumed 5 mg 21 days on/7 days off following office visit 06/29/2019  Revlimidplaced on hold 07/14/2019  Revlimid resumed 08/21/2019  Bone survey 09/09/2019- no acute findings or clear explanation for right buttock pain.  Lucent lesions in the right pelvis are stable without pathologic fracture.  Evidence of healing lytic lesions in the right scapula, cervical spine spinous processes and left L4 transverse process.  Possible mild progression of lytic lesions within the L1 and L4 vertebral bodies.  Stable lytic lesions in the calvarium.  PET scan 09/25/2019-new FDG avid bone lesions in the right humeral neck and sacrum similar remaining lytic lesions with FDG activity below background  Bone marrow biopsy at [facility] on 10/01/2019-5% plasma cells on the bone marrow biopsy suboptimal sample   Cycle 1 daratumumab, pomalidomide, Decadron 10/19/2019  Zometa 10/19/2019  Cycle 2 daratumumab, pomalidomide, Decadron 11/16/2019 (he will begin the pomalidomide 11/17/2019)   2.Pain secondary to multiple myeloma involving the spine and pelvis-resolved  MRI of the lumbar spine  10/02/2018-numerous rounded foci in the vertebral bodies, hypertrophy of the L4 transverse process  3.Hypertension-losartan dose increased  11/15/2017  4.Depression-improved with Wellbutrin beginning September 2020  5.Altered mental status-improved depression related?   Disposition: Steve Carter appears stable.  He has completed 1 cycle of pomalidomide, daratumumab, dexamethasone.  Overall he seems to be tolerating treatment well.  Plan to proceed with cycle 2 today as scheduled.  We will follow-up on the myeloma labs from today.  We reviewed the CBC from today.  Counts adequate to proceed with treatment.  We will see him in follow-up in 2 weeks.  He will contact the office in the interim with any problems.  Plan reviewed with Dr. Benay Spice.    Steve Carter ANP/GNP-BC   11/16/2019  12:25 PM

## 2019-11-17 ENCOUNTER — Encounter: Payer: Self-pay | Admitting: Oncology

## 2019-11-17 ENCOUNTER — Telehealth: Payer: Self-pay | Admitting: *Deleted

## 2019-11-17 ENCOUNTER — Other Ambulatory Visit: Payer: Self-pay | Admitting: *Deleted

## 2019-11-17 ENCOUNTER — Telehealth: Payer: Self-pay | Admitting: Oncology

## 2019-11-17 ENCOUNTER — Other Ambulatory Visit: Payer: Self-pay | Admitting: Oncology

## 2019-11-17 DIAGNOSIS — C9000 Multiple myeloma not having achieved remission: Secondary | ICD-10-CM

## 2019-11-17 LAB — PROTEIN ELECTROPHORESIS, SERUM
A/G Ratio: 1.5 (ref 0.7–1.7)
Albumin ELP: 3.6 g/dL (ref 2.9–4.4)
Alpha-1-Globulin: 0.2 g/dL (ref 0.0–0.4)
Alpha-2-Globulin: 0.8 g/dL (ref 0.4–1.0)
Beta Globulin: 0.9 g/dL (ref 0.7–1.3)
Gamma Globulin: 0.5 g/dL (ref 0.4–1.8)
Globulin, Total: 2.4 g/dL (ref 2.2–3.9)
M-Spike, %: 0.2 g/dL — ABNORMAL HIGH
Total Protein ELP: 6 g/dL (ref 6.0–8.5)

## 2019-11-17 LAB — KAPPA/LAMBDA LIGHT CHAINS
Kappa free light chain: 3.4 mg/L (ref 3.3–19.4)
Kappa, lambda light chain ratio: 0.25 — ABNORMAL LOW (ref 0.26–1.65)
Lambda free light chains: 13.4 mg/L (ref 5.7–26.3)

## 2019-11-17 MED ORDER — ZOLPIDEM TARTRATE ER 12.5 MG PO TBCR: 12.5000 mg | EXTENDED_RELEASE_TABLET | Freq: Every evening | ORAL | 0 refills | Status: DC | PRN

## 2019-11-17 NOTE — Telephone Encounter (Signed)
Scheduled per los. Called and spoke with patients wife. Confirmed changes and new appts

## 2019-11-17 NOTE — Telephone Encounter (Signed)
Dr. Benay Spice approves refill on [place]. Does not feel comfortable changing sleep med due to potential interaction with his antidepressant and abilify.

## 2019-11-17 NOTE — Telephone Encounter (Signed)
Spoke with spouse requesting clearer copy of form scanned to MyChart.

## 2019-11-23 ENCOUNTER — Inpatient Hospital Stay: Payer: BC Managed Care – PPO

## 2019-11-23 ENCOUNTER — Other Ambulatory Visit: Payer: Self-pay

## 2019-11-23 ENCOUNTER — Telehealth: Payer: Self-pay | Admitting: Nurse Practitioner

## 2019-11-23 VITALS — BP 130/91 | HR 77 | Temp 98.0°F | Resp 18 | Ht 74.0 in | Wt 247.8 lb

## 2019-11-23 DIAGNOSIS — C9000 Multiple myeloma not having achieved remission: Secondary | ICD-10-CM | POA: Diagnosis not present

## 2019-11-23 LAB — CMP (CANCER CENTER ONLY)
ALT: 65 U/L — ABNORMAL HIGH (ref 0–44)
AST: 18 U/L (ref 15–41)
Albumin: 3.6 g/dL (ref 3.5–5.0)
Alkaline Phosphatase: 66 U/L (ref 38–126)
Anion gap: 13 (ref 5–15)
BUN: 12 mg/dL (ref 6–20)
CO2: 22 mmol/L (ref 22–32)
Calcium: 8.7 mg/dL — ABNORMAL LOW (ref 8.9–10.3)
Chloride: 101 mmol/L (ref 98–111)
Creatinine: 1.19 mg/dL (ref 0.61–1.24)
GFR, Est AFR Am: >60 mL/min (ref >60)
GFR, Estimated: >60 mL/min (ref >60)
Glucose, Bld: 398 mg/dL — ABNORMAL HIGH (ref 70–99)
Potassium: 4.4 mmol/L (ref 3.5–5.1)
Sodium: 136 mmol/L (ref 135–145)
Total Bilirubin: 0.3 mg/dL (ref 0.3–1.2)
Total Protein: 6.3 g/dL — ABNORMAL LOW (ref 6.5–8.1)

## 2019-11-23 LAB — CBC WITH DIFFERENTIAL (CANCER CENTER ONLY)
Abs Immature Granulocytes: 0.07 10*3/uL (ref 0.00–0.07)
Basophils Absolute: 0 10*3/uL (ref 0.0–0.1)
Basophils Relative: 1 %
Eosinophils Absolute: 0.1 10*3/uL (ref 0.0–0.5)
Eosinophils Relative: 1 %
HCT: 48.9 % (ref 39.0–52.0)
Hemoglobin: 16.7 g/dL (ref 13.0–17.0)
Immature Granulocytes: 1 %
Lymphocytes Relative: 9 %
Lymphs Abs: 0.5 10*3/uL — ABNORMAL LOW (ref 0.7–4.0)
MCH: 30.9 pg (ref 26.0–34.0)
MCHC: 34.2 g/dL (ref 30.0–36.0)
MCV: 90.4 fL (ref 80.0–100.0)
Monocytes Absolute: 0.3 10*3/uL (ref 0.1–1.0)
Monocytes Relative: 6 %
Neutro Abs: 4.3 10*3/uL (ref 1.7–7.7)
Neutrophils Relative %: 82 %
Platelet Count: 157 10*3/uL (ref 150–400)
RBC: 5.41 MIL/uL (ref 4.22–5.81)
RDW: 13.5 % (ref 11.5–15.5)
WBC Count: 5.3 10*3/uL (ref 4.0–10.5)
nRBC: 0 % (ref 0.0–0.2)

## 2019-11-23 MED ORDER — DIPHENHYDRAMINE HCL 25 MG PO CAPS
50.0000 mg | ORAL_CAPSULE | Freq: Once | ORAL | Status: AC
  Administered 2019-11-23: 11:00:00 50 mg via ORAL

## 2019-11-23 MED ORDER — DEXAMETHASONE 4 MG PO TABS
ORAL_TABLET | ORAL | Status: AC
  Filled 2019-11-23: qty 5

## 2019-11-23 MED ORDER — DEXAMETHASONE 4 MG PO TABS
20.0000 mg | ORAL_TABLET | Freq: Once | ORAL | Status: AC
  Administered 2019-11-23: 20 mg via ORAL

## 2019-11-23 MED ORDER — ACETAMINOPHEN 325 MG PO TABS
650.0000 mg | ORAL_TABLET | Freq: Once | ORAL | Status: AC
  Administered 2019-11-23: 11:00:00 650 mg via ORAL

## 2019-11-23 MED ORDER — DIPHENHYDRAMINE HCL 25 MG PO CAPS
ORAL_CAPSULE | ORAL | Status: AC
  Filled 2019-11-23: qty 2

## 2019-11-23 MED ORDER — DARATUMUMAB-HYALURONIDASE-FIHJ 1800-30000 MG-UT/15ML ~~LOC~~ SOLN
1800.0000 mg | Freq: Once | SUBCUTANEOUS | Status: AC
  Administered 2019-11-23: 1800 mg via SUBCUTANEOUS
  Filled 2019-11-23: qty 15

## 2019-11-23 MED ORDER — ACETAMINOPHEN 325 MG PO TABS
ORAL_TABLET | ORAL | Status: AC
  Filled 2019-11-23: qty 2

## 2019-11-23 NOTE — Telephone Encounter (Signed)
I contacted Steve Carter regarding the blood sugar of 398 on labs today.  She will confirm he is taking Metformin as prescribed.  They will monitor his blood sugar closely over the next several days and contact us/PCP for persistent elevated readings.  He will avoid concentrated sweets and make sure he is adequately hydrated.

## 2019-11-23 NOTE — Patient Instructions (Signed)
[facility] Discharge Instructions for Patients Receiving Chemotherapy  Today you received the following chemotherapy agents: Darzalex Faspro  To help prevent nausea and vomiting after your treatment, we encourage you to take your nausea medication as directed.    If you develop nausea and vomiting that is not controlled by your nausea medication, call the clinic.   BELOW ARE SYMPTOMS THAT SHOULD BE REPORTED IMMEDIATELY:  *FEVER GREATER THAN 100.5 F  *CHILLS WITH OR WITHOUT FEVER  NAUSEA AND VOMITING THAT IS NOT CONTROLLED WITH YOUR NAUSEA MEDICATION  *UNUSUAL SHORTNESS OF BREATH  *UNUSUAL BRUISING OR BLEEDING  TENDERNESS IN MOUTH AND THROAT WITH OR WITHOUT PRESENCE OF ULCERS  *URINARY PROBLEMS  *BOWEL PROBLEMS  UNUSUAL RASH Items with * indicate a potential emergency and should be followed up as soon as possible.  Feel free to call the clinic should you have any questions or concerns. The clinic phone number is [phone].  Please show the CHEMO ALERT CARD at check-in to the Emergency Department and triage nurse.   

## 2019-11-30 ENCOUNTER — Inpatient Hospital Stay: Payer: BC Managed Care – PPO

## 2019-11-30 ENCOUNTER — Inpatient Hospital Stay (HOSPITAL_BASED_OUTPATIENT_CLINIC_OR_DEPARTMENT_OTHER): Payer: BC Managed Care – PPO | Admitting: Oncology

## 2019-11-30 ENCOUNTER — Other Ambulatory Visit: Payer: Self-pay

## 2019-11-30 VITALS — BP 136/90 | HR 71 | Temp 98.7°F | Resp 17 | Ht 74.0 in | Wt 248.8 lb

## 2019-11-30 DIAGNOSIS — C9000 Multiple myeloma not having achieved remission: Secondary | ICD-10-CM

## 2019-11-30 LAB — CBC WITH DIFFERENTIAL (CANCER CENTER ONLY)
Abs Immature Granulocytes: 0.06 10*3/uL (ref 0.00–0.07)
Basophils Absolute: 0.1 10*3/uL (ref 0.0–0.1)
Basophils Relative: 1 %
Eosinophils Absolute: 0.1 10*3/uL (ref 0.0–0.5)
Eosinophils Relative: 2 %
HCT: 44.7 % (ref 39.0–52.0)
Hemoglobin: 15.7 g/dL (ref 13.0–17.0)
Immature Granulocytes: 1 %
Lymphocytes Relative: 6 %
Lymphs Abs: 0.5 10*3/uL — ABNORMAL LOW (ref 0.7–4.0)
MCH: 31.3 pg (ref 26.0–34.0)
MCHC: 35.1 g/dL (ref 30.0–36.0)
MCV: 89 fL (ref 80.0–100.0)
Monocytes Absolute: 1.2 10*3/uL — ABNORMAL HIGH (ref 0.1–1.0)
Monocytes Relative: 14 %
Neutro Abs: 6.6 10*3/uL (ref 1.7–7.7)
Neutrophils Relative %: 76 %
Platelet Count: 119 10*3/uL — ABNORMAL LOW (ref 150–400)
RBC: 5.02 MIL/uL (ref 4.22–5.81)
RDW: 13.6 % (ref 11.5–15.5)
WBC Count: 8.5 10*3/uL (ref 4.0–10.5)
nRBC: 0 % (ref 0.0–0.2)

## 2019-11-30 LAB — CMP (CANCER CENTER ONLY)
ALT: 47 U/L — ABNORMAL HIGH (ref 0–44)
AST: 16 U/L (ref 15–41)
Albumin: 3.6 g/dL (ref 3.5–5.0)
Alkaline Phosphatase: 63 U/L (ref 38–126)
Anion gap: 12 (ref 5–15)
BUN: 10 mg/dL (ref 6–20)
CO2: 23 mmol/L (ref 22–32)
Calcium: 8.2 mg/dL — ABNORMAL LOW (ref 8.9–10.3)
Chloride: 101 mmol/L (ref 98–111)
Creatinine: 0.94 mg/dL (ref 0.61–1.24)
GFR, Est AFR Am: >60 mL/min (ref >60)
GFR, Estimated: >60 mL/min (ref >60)
Glucose, Bld: 297 mg/dL — ABNORMAL HIGH (ref 70–99)
Potassium: 3.9 mmol/L (ref 3.5–5.1)
Sodium: 136 mmol/L (ref 135–145)
Total Bilirubin: 0.4 mg/dL (ref 0.3–1.2)
Total Protein: 6.2 g/dL — ABNORMAL LOW (ref 6.5–8.1)

## 2019-11-30 MED ORDER — ACETAMINOPHEN 325 MG PO TABS
ORAL_TABLET | ORAL | Status: AC
  Filled 2019-11-30: qty 2

## 2019-11-30 MED ORDER — ACETAMINOPHEN 325 MG PO TABS
650.0000 mg | ORAL_TABLET | Freq: Once | ORAL | Status: AC
  Administered 2019-11-30: 650 mg via ORAL

## 2019-11-30 MED ORDER — DEXAMETHASONE 4 MG PO TABS
ORAL_TABLET | ORAL | Status: AC
  Filled 2019-11-30: qty 5

## 2019-11-30 MED ORDER — DIPHENHYDRAMINE HCL 25 MG PO CAPS
50.0000 mg | ORAL_CAPSULE | Freq: Once | ORAL | Status: AC
  Administered 2019-11-30: 50 mg via ORAL

## 2019-11-30 MED ORDER — DEXAMETHASONE 4 MG PO TABS
20.0000 mg | ORAL_TABLET | Freq: Once | ORAL | Status: AC
  Administered 2019-11-30: 20 mg via ORAL

## 2019-11-30 MED ORDER — DARATUMUMAB-HYALURONIDASE-FIHJ 1800-30000 MG-UT/15ML ~~LOC~~ SOLN
1800.0000 mg | Freq: Once | SUBCUTANEOUS | Status: AC
  Administered 2019-11-30: 09:00:00 1800 mg via SUBCUTANEOUS
  Filled 2019-11-30: qty 15

## 2019-11-30 MED ORDER — DIPHENHYDRAMINE HCL 25 MG PO CAPS
ORAL_CAPSULE | ORAL | Status: AC
  Filled 2019-11-30: qty 2

## 2019-11-30 NOTE — Progress Notes (Signed)
[place] OFFICE PROGRESS NOTE   Diagnosis: Multiple myeloma  INTERVAL HISTORY:   [name] is completing cycle 2 of pomalidomide, daratumumab, and Decadron.  No dyspnea, rash, nausea, or diarrhea.  His only complaint is malaise.  He no longer has gluteal pain.  Good appetite.  His blood sugar returned at 198 this morning.  Objective:  Vital signs in last 24 hours:  Blood pressure 136/90, pulse 71, temperature 98.7 F (37.1 C), temperature source Temporal, resp. rate 17, height '6\' 2"'$  (1.88 m), weight 248 lb 12.8 oz (112.9 kg), SpO2 96 %.    HEENT: No thrush, healing ulcer at the left side of the tongue Resp: Lungs clear bilaterally Cardio: Regular rate and rhythm  Vascular: No leg edema  Skin: No rash    Lab Results:  Lab Results  Component Value Date   WBC 8.5 11/30/2019   HGB 15.7 11/30/2019   HCT 44.7 11/30/2019   MCV 89.0 11/30/2019   PLT 119 (L) 11/30/2019   NEUTROABS 6.6 11/30/2019    CMP  Lab Results  Component Value Date   NA 136 11/23/2019   K 4.4 11/23/2019   CL 101 11/23/2019   CO2 22 11/23/2019   GLUCOSE 398 (H) 11/23/2019   BUN 12 11/23/2019   CREATININE 1.19 11/23/2019   CALCIUM 8.7 (L) 11/23/2019   PROT 6.3 (L) 11/23/2019   ALBUMIN 3.6 11/23/2019   AST 18 11/23/2019   ALT 65 (H) 11/23/2019   ALKPHOS 66 11/23/2019   BILITOT 0.3 11/23/2019   GFRNONAA >60 11/23/2019   GFRAA >60 11/23/2019     Medications: I have reviewed the patient's current medications.   Assessment/Plan: 1.Multiple myeloma-IgG lambda serum monoclonal protein, elevated serum lambda light chains  Bone survey 10/11/2017-lytic lesions noted in the skull, right iliac, left second rib, and mottled appearance of the proximal femurs/pelvis  Bone marrow biopsy 10/14/2017-hypercellular marrow with plasma cell neoplasm, 82% plasma cells, lambda light chain restricted, hyperdiploid with gains of chromosomes 3, 5, 7, 9, and 11. FISH panel positive for  gain of ATM(+11)  Cycle 1 RVD 10/18/2017 (Revlimid started 10/23/2017)  Cycle 2 RVD 11/15/2017  Cycle 3 RVD 12/13/2017  Cycle 4 RVD 01/14/2018  Cycle 5 of RVD 02/14/2018 (Revlimid given for 7 days and 1 week of Velcade), therapy held beginning 02/21/2018 secondary to plan for stem cell therapy  Bone marrow biopsy 02/20/2018, 1-2% plasma cell  PET scan 02/20/2018, no malignant range activity above background, numerous lytic lesions throughout the axial and appendicular skeleton,, left iliac wing fracture  Melphalan, 200 mg/m on 03/24/2018  Stem cell infusion 03/25/2018  Restaging at [facility] 07/09/2018: Normal lambda light chains, no serum M spike, bone marrow biopsy negative for myeloma, less than 1% plasma cells  PET scan at [facility] 07/09/2018-lytic bone lesions, no malignant range activity  Initiation of maintenance Revlimid, 10 mg, 21/28 days 08/15/2018  Cycle 2 maintenance Revlimid 09/12/2018  Cycle 3 maintenance Revlimid 10/10/2018  Revlimid placed on hold 06/15/2019 due to presyncopal/syncopal episodes and diarrhea  Revlimid resumed 5 mg 21 days on/7 days off following office visit 06/29/2019  Revlimidplaced on hold 07/14/2019  Revlimid resumed 08/21/2019  Bone survey 09/09/2019- no acute findings or clear explanation for right buttock pain.  Lucent lesions in the right pelvis are stable without pathologic fracture.  Evidence of healing lytic lesions in the right scapula, cervical spine spinous processes and left L4 transverse process.  Possible mild progression of lytic lesions within the L1 and L4 vertebral  bodies.  Stable lytic lesions in the calvarium.  PET scan 09/25/2019-new FDG avid bone lesions in the right humeral neck and sacrum similar remaining lytic lesions with FDG activity below background  Bone marrow biopsy at [facility] on 10/01/2019-5% plasma cells on the bone marrow biopsy suboptimal sample   Cycle 1 daratumumab, pomalidomide, Decadron  10/19/2019  Zometa 10/19/2019  Cycle 2 daratumumab, pomalidomide, Decadron 11/16/2019 (he will begin the pomalidomide 11/17/2019)   2.Pain secondary to multiple myeloma involving the spine and pelvis-resolved  MRI of the lumbar spine 10/02/2018-numerous rounded foci in the vertebral bodies, hypertrophy of the L4 transverse process  3.Hypertension-losartan dose increased 11/15/2017  4.Depression-improved with Wellbutrin beginning September 2020  5.Altered mental status-improved depression related?     Disposition: [name] appears well.  The serum M spike was slightly lower after the first cycle of salvage therapy.  He is completing cycle 2 of pomalidomide, daratumumab, and Decadron.  He will be scheduled for an office visit prior to cycle 3.  The daratumumab will be switched to an every 2-week schedule beginning with the treatment on 12/14/2019.  He will continue monitoring the blood sugar and follow a diabetic diet.  We will ask him to call for a blood sugar of greater than 300.  He may need to adjust the diabetic regimen with his primary provider.  Betsy Coder, MD  11/30/2019  8:31 AM

## 2019-11-30 NOTE — Patient Instructions (Signed)
[facility] Discharge Instructions for Patients Receiving Chemotherapy  Today you received the following chemotherapy agents: Darzalex Faspro  To help prevent nausea and vomiting after your treatment, we encourage you to take your nausea medication as directed.    If you develop nausea and vomiting that is not controlled by your nausea medication, call the clinic.   BELOW ARE SYMPTOMS THAT SHOULD BE REPORTED IMMEDIATELY:  *FEVER GREATER THAN 100.5 F  *CHILLS WITH OR WITHOUT FEVER  NAUSEA AND VOMITING THAT IS NOT CONTROLLED WITH YOUR NAUSEA MEDICATION  *UNUSUAL SHORTNESS OF BREATH  *UNUSUAL BRUISING OR BLEEDING  TENDERNESS IN MOUTH AND THROAT WITH OR WITHOUT PRESENCE OF ULCERS  *URINARY PROBLEMS  *BOWEL PROBLEMS  UNUSUAL RASH Items with * indicate a potential emergency and should be followed up as soon as possible.  Feel free to call the clinic should you have any questions or concerns. The clinic phone number is [phone].  Please show the CHEMO ALERT CARD at check-in to the Emergency Department and triage nurse.   

## 2019-12-01 ENCOUNTER — Telehealth: Payer: Self-pay | Admitting: Nurse Practitioner

## 2019-12-01 ENCOUNTER — Encounter: Payer: Self-pay | Admitting: Oncology

## 2019-12-01 DIAGNOSIS — C7951 Secondary malignant neoplasm of bone: Secondary | ICD-10-CM | POA: Insufficient documentation

## 2019-12-01 NOTE — Telephone Encounter (Signed)
Scheduled per los. Called and spoke with wife, confirmed appt

## 2019-12-02 ENCOUNTER — Telehealth: Payer: Self-pay | Admitting: *Deleted

## 2019-12-02 NOTE — Telephone Encounter (Signed)
Returned call to wife in regards to Mychart message of patient waking up shaking during the night. She reports he had no fever. Did not think to check his blood sugar. Dr. Benay Spice does not think this is related to the daratumumab or pomalidomide. Suggest checking temp and blood sugar if it happens again. IF he has a fever, call office or go to ER. Could be interaction/side effect of the psych drugs he is taking and Dr. Benay Spice suggests checking with that physician as well.

## 2019-12-08 ENCOUNTER — Inpatient Hospital Stay: Payer: BC Managed Care – PPO

## 2019-12-08 ENCOUNTER — Other Ambulatory Visit: Payer: Self-pay | Admitting: Oncology

## 2019-12-08 ENCOUNTER — Encounter: Payer: Self-pay | Admitting: *Deleted

## 2019-12-08 ENCOUNTER — Other Ambulatory Visit: Payer: Self-pay

## 2019-12-08 ENCOUNTER — Inpatient Hospital Stay: Payer: BC Managed Care – PPO | Attending: Oncology

## 2019-12-08 ENCOUNTER — Inpatient Hospital Stay: Payer: BC Managed Care – PPO | Admitting: Oncology

## 2019-12-08 VITALS — BP 121/68 | HR 71 | Temp 97.3°F | Resp 17 | Ht 74.0 in | Wt 247.1 lb

## 2019-12-08 DIAGNOSIS — R4182 Altered mental status, unspecified: Secondary | ICD-10-CM | POA: Insufficient documentation

## 2019-12-08 DIAGNOSIS — R102 Pelvic and perineal pain: Secondary | ICD-10-CM | POA: Insufficient documentation

## 2019-12-08 DIAGNOSIS — Z79899 Other long term (current) drug therapy: Secondary | ICD-10-CM | POA: Insufficient documentation

## 2019-12-08 DIAGNOSIS — F329 Major depressive disorder, single episode, unspecified: Secondary | ICD-10-CM | POA: Insufficient documentation

## 2019-12-08 DIAGNOSIS — G893 Neoplasm related pain (acute) (chronic): Secondary | ICD-10-CM | POA: Insufficient documentation

## 2019-12-08 DIAGNOSIS — Z5112 Encounter for antineoplastic immunotherapy: Secondary | ICD-10-CM | POA: Diagnosis present

## 2019-12-08 DIAGNOSIS — I10 Essential (primary) hypertension: Secondary | ICD-10-CM | POA: Insufficient documentation

## 2019-12-08 DIAGNOSIS — C9000 Multiple myeloma not having achieved remission: Secondary | ICD-10-CM

## 2019-12-08 DIAGNOSIS — R5381 Other malaise: Secondary | ICD-10-CM | POA: Insufficient documentation

## 2019-12-08 DIAGNOSIS — E119 Type 2 diabetes mellitus without complications: Secondary | ICD-10-CM | POA: Insufficient documentation

## 2019-12-08 LAB — CMP (CANCER CENTER ONLY)
ALT: 42 U/L (ref 0–44)
AST: 24 U/L (ref 15–41)
Albumin: 3.8 g/dL (ref 3.5–5.0)
Alkaline Phosphatase: 61 U/L (ref 38–126)
Anion gap: 11 (ref 5–15)
BUN: 13 mg/dL (ref 6–20)
CO2: 23 mmol/L (ref 22–32)
Calcium: 8.3 mg/dL — ABNORMAL LOW (ref 8.9–10.3)
Chloride: 101 mmol/L (ref 98–111)
Creatinine: 1 mg/dL (ref 0.61–1.24)
GFR, Est AFR Am: >60 mL/min (ref >60)
GFR, Estimated: >60 mL/min (ref >60)
Glucose, Bld: 329 mg/dL — ABNORMAL HIGH (ref 70–99)
Potassium: 4.1 mmol/L (ref 3.5–5.1)
Sodium: 135 mmol/L (ref 135–145)
Total Bilirubin: 0.7 mg/dL (ref 0.3–1.2)
Total Protein: 6.2 g/dL — ABNORMAL LOW (ref 6.5–8.1)

## 2019-12-08 LAB — CBC WITH DIFFERENTIAL (CANCER CENTER ONLY)
Abs Immature Granulocytes: 0.07 10*3/uL (ref 0.00–0.07)
Basophils Absolute: 0 10*3/uL (ref 0.0–0.1)
Basophils Relative: 1 %
Eosinophils Absolute: 0.2 10*3/uL (ref 0.0–0.5)
Eosinophils Relative: 5 %
HCT: 44.9 % (ref 39.0–52.0)
Hemoglobin: 15.7 g/dL (ref 13.0–17.0)
Immature Granulocytes: 2 %
Lymphocytes Relative: 8 %
Lymphs Abs: 0.4 10*3/uL — ABNORMAL LOW (ref 0.7–4.0)
MCH: 31.4 pg (ref 26.0–34.0)
MCHC: 35 g/dL (ref 30.0–36.0)
MCV: 89.8 fL (ref 80.0–100.0)
Monocytes Absolute: 0.8 10*3/uL (ref 0.1–1.0)
Monocytes Relative: 17 %
Neutro Abs: 3.1 10*3/uL (ref 1.7–7.7)
Neutrophils Relative %: 67 %
Platelet Count: 117 10*3/uL — ABNORMAL LOW (ref 150–400)
RBC: 5 MIL/uL (ref 4.22–5.81)
RDW: 14.4 % (ref 11.5–15.5)
WBC Count: 4.6 10*3/uL (ref 4.0–10.5)
nRBC: 0 % (ref 0.0–0.2)

## 2019-12-08 MED ORDER — DIPHENHYDRAMINE HCL 25 MG PO CAPS
50.0000 mg | ORAL_CAPSULE | Freq: Once | ORAL | Status: AC
  Administered 2019-12-08: 50 mg via ORAL

## 2019-12-08 MED ORDER — DEXAMETHASONE 4 MG PO TABS
20.0000 mg | ORAL_TABLET | Freq: Once | ORAL | Status: AC
  Administered 2019-12-08: 20 mg via ORAL

## 2019-12-08 MED ORDER — DARATUMUMAB-HYALURONIDASE-FIHJ 1800-30000 MG-UT/15ML ~~LOC~~ SOLN
1800.0000 mg | Freq: Once | SUBCUTANEOUS | Status: AC
  Administered 2019-12-08: 1800 mg via SUBCUTANEOUS
  Filled 2019-12-08: qty 15

## 2019-12-08 MED ORDER — DEXAMETHASONE 4 MG PO TABS
ORAL_TABLET | ORAL | Status: AC
  Filled 2019-12-08: qty 5

## 2019-12-08 MED ORDER — DEXAMETHASONE 4 MG PO TABS: ORAL_TABLET | ORAL | 1 refills | Status: DC

## 2019-12-08 MED ORDER — ACETAMINOPHEN 325 MG PO TABS
650.0000 mg | ORAL_TABLET | Freq: Once | ORAL | Status: AC
  Administered 2019-12-08: 650 mg via ORAL

## 2019-12-08 MED ORDER — ACETAMINOPHEN 325 MG PO TABS
ORAL_TABLET | ORAL | Status: AC
  Filled 2019-12-08: qty 2

## 2019-12-08 MED ORDER — DIPHENHYDRAMINE HCL 25 MG PO CAPS
ORAL_CAPSULE | ORAL | Status: AC
  Filled 2019-12-08: qty 2

## 2019-12-08 NOTE — Patient Instructions (Signed)
[facility] Discharge Instructions for Patients Receiving Chemotherapy  Today you received the following chemotherapy agents: Darzalex Faspro  To help prevent nausea and vomiting after your treatment, we encourage you to take your nausea medication as directed.    If you develop nausea and vomiting that is not controlled by your nausea medication, call the clinic.   BELOW ARE SYMPTOMS THAT SHOULD BE REPORTED IMMEDIATELY:  *FEVER GREATER THAN 100.5 F  *CHILLS WITH OR WITHOUT FEVER  NAUSEA AND VOMITING THAT IS NOT CONTROLLED WITH YOUR NAUSEA MEDICATION  *UNUSUAL SHORTNESS OF BREATH  *UNUSUAL BRUISING OR BLEEDING  TENDERNESS IN MOUTH AND THROAT WITH OR WITHOUT PRESENCE OF ULCERS  *URINARY PROBLEMS  *BOWEL PROBLEMS  UNUSUAL RASH Items with * indicate a potential emergency and should be followed up as soon as possible.  Feel free to call the clinic should you have any questions or concerns. The clinic phone number is [phone].  Please show the CHEMO ALERT CARD at check-in to the Emergency Department and triage nurse.   

## 2019-12-08 NOTE — Progress Notes (Signed)
Sent today's labs and office note to PCP office, Dellia Nims, FNP with message that his home glucose checks have been running in 400's. Needs f/u.

## 2019-12-08 NOTE — Progress Notes (Signed)
[place] OFFICE PROGRESS NOTE   Diagnosis: Multiple myeloma  INTERVAL HISTORY:   Mr. Cromley returns as scheduled. He completed another cycle of pomalidomide yesterday. No nausea, diarrhea, or neuropathy symptoms. He continues daratumumab. No dyspnea, cough, or rash. No pain. He reports malaise. Depression symptoms remain improved. His blood sugar has been running over 400 at home. He is not following a diabetic diet.  Objective:  Vital signs in last 24 hours:  Blood pressure 121/68, pulse 71, temperature (!) 97.3 F (36.3 C), temperature source Temporal, resp. rate 17, height _0  (1.88 m), weight 247 lb 1.6 oz (112.1 kg), SpO2 97 %.   Limited physical examination secondary to distancing with the Covid pandemic HEENT: No thrush, healing ulcer of the left lateral tongue  Vascular: No leg edema   Lab Results:  Lab Results  Component Value Date   WBC 4.6 12/08/2019   HGB 15.7 12/08/2019   HCT 44.9 12/08/2019   MCV 89.8 12/08/2019   PLT 117 (L) 12/08/2019   NEUTROABS 3.1 12/08/2019    CMP  Lab Results  Component Value Date   NA 135 12/08/2019   K 4.1 12/08/2019   CL 101 12/08/2019   CO2 23 12/08/2019   GLUCOSE 329 (H) 12/08/2019   BUN 13 12/08/2019   CREATININE 1.00 12/08/2019   CALCIUM 8.3 (L) 12/08/2019   PROT 6.2 (L) 12/08/2019   ALBUMIN 3.8 12/08/2019   AST 24 12/08/2019   ALT 42 12/08/2019   ALKPHOS 61 12/08/2019   BILITOT 0.7 12/08/2019   GFRNONAA >60 12/08/2019   GFRAA >60 12/08/2019     Medications: I have reviewed the patient's current medications.   Assessment/Plan: 1.Multiple myeloma-IgG lambda serum monoclonal protein, elevated serum lambda light chains  Bone survey 10/11/2017-lytic lesions noted in the skull, right iliac, left second rib, and mottled appearance of the proximal femurs/pelvis  Bone marrow biopsy 10/14/2017-hypercellular marrow with plasma cell neoplasm, 82% plasma cells, lambda light chain restricted,  hyperdiploid with gains of chromosomes 3, 5, 7, 9, and 11. FISH panel positive for gain of ATM(+11)  Cycle 1 RVD 10/18/2017 (Revlimid started 10/23/2017)  Cycle 2 RVD 11/15/2017  Cycle 3 RVD 12/13/2017  Cycle 4 RVD 01/14/2018  Cycle 5 of RVD 02/14/2018 (Revlimid given for 7 days and 1 week of Velcade), therapy held beginning 02/21/2018 secondary to plan for stem cell therapy  Bone marrow biopsy 02/20/2018, 1-2% plasma cell  PET scan 02/20/2018, no malignant range activity above background, numerous lytic lesions throughout the axial and appendicular skeleton,, left iliac wing fracture  Melphalan, 200 mg/m on 03/24/2018  Stem cell infusion 03/25/2018  Restaging at [facility] 07/09/2018: Normal lambda light chains, no serum M spike, bone marrow biopsy negative for myeloma, less than 1% plasma cells  PET scan at [facility] 07/09/2018-lytic bone lesions, no malignant range activity  Initiation of maintenance Revlimid, 10 mg, 21/28 days 08/15/2018  Cycle 2 maintenance Revlimid 09/12/2018  Cycle 3 maintenance Revlimid 10/10/2018  Revlimid placed on hold 06/15/2019 due to presyncopal/syncopal episodes and diarrhea  Revlimid resumed 5 mg 21 days on/7 days off following office visit 06/29/2019  Revlimidplaced on hold 07/14/2019  Revlimid resumed 08/21/2019  Bone survey 09/09/2019- no acute findings or clear explanation for right buttock pain.  Lucent lesions in the right pelvis are stable without pathologic fracture.  Evidence of healing lytic lesions in the right scapula, cervical spine spinous processes and left L4 transverse process.  Possible mild progression of lytic lesions within the L1 and  L4 vertebral bodies.  Stable lytic lesions in the calvarium.  PET scan 09/25/2019-new FDG avid bone lesions in the right humeral neck and sacrum similar remaining lytic lesions with FDG activity below background  Bone marrow biopsy at [facility] on 10/01/2019-5% plasma cells on the bone marrow biopsy  suboptimal sample   Cycle 1 daratumumab, pomalidomide, Decadron 10/19/2019  Zometa 10/19/2019  Cycle 2 daratumumab, pomalidomide, Decadron 11/16/2019 (he will begin the pomalidomide 11/17/2019)  Cycle 3 daratumumab, pomalidomide, and Decadron 12/14/2019   2.Pain secondary to multiple myeloma involving the spine and pelvis-resolved  MRI of the lumbar spine 10/02/2018-numerous rounded foci in the vertebral bodies, hypertrophy of the L4 transverse process  3.Hypertension-losartan dose increased 11/15/2017  4.Depression-improved with Wellbutrin beginning September 2020  5.Altered mental status-improved depression related? 6. Diabetes   Disposition: Mr. Monrreal appears unchanged. He is tolerating the current treatment well. He will complete a final weekly cycle of daratumumab today. Daratumumab will be changed to an every 2-week schedule beginning with treatment on 12/14/2019. He will begin cycle 3 that day.  The blood sugar is elevated and he is not following a diabetic diet. The hyperglycemia is in part related to Decadron therapy. He is taking Metformin. I recommended he follow-up with his primary provider this week for blood sugar management. He will likely require insulin in addition to Metformin.  Mr. Siverson is scheduled for a follow-up visit at [facility] in a few weeks. He will return for an office visit here and repeat myeloma panel on 12/28/2019.  He will be due for the next treatment with Zometa in February.  Betsy Coder, MD  12/08/2019  11:14 AM

## 2019-12-08 NOTE — Progress Notes (Signed)
MD aware of pt's glucose today, per Cristy Friedlander, RN. No new orders at this time

## 2019-12-09 ENCOUNTER — Encounter: Payer: Self-pay | Admitting: Oncology

## 2019-12-10 ENCOUNTER — Encounter: Payer: Self-pay | Admitting: Oncology

## 2019-12-13 ENCOUNTER — Other Ambulatory Visit: Payer: Self-pay | Admitting: Adult Health

## 2019-12-13 DIAGNOSIS — F411 Generalized anxiety disorder: Secondary | ICD-10-CM

## 2019-12-13 DIAGNOSIS — F331 Major depressive disorder, recurrent, moderate: Secondary | ICD-10-CM

## 2019-12-14 ENCOUNTER — Encounter: Payer: Self-pay | Admitting: *Deleted

## 2019-12-14 ENCOUNTER — Inpatient Hospital Stay: Payer: BC Managed Care – PPO | Admitting: Nurse Practitioner

## 2019-12-14 ENCOUNTER — Inpatient Hospital Stay: Payer: BC Managed Care – PPO

## 2019-12-18 DIAGNOSIS — E119 Type 2 diabetes mellitus without complications: Secondary | ICD-10-CM | POA: Insufficient documentation

## 2019-12-18 DIAGNOSIS — E349 Endocrine disorder, unspecified: Secondary | ICD-10-CM | POA: Insufficient documentation

## 2019-12-18 DIAGNOSIS — I10 Essential (primary) hypertension: Secondary | ICD-10-CM | POA: Insufficient documentation

## 2019-12-27 ENCOUNTER — Other Ambulatory Visit: Payer: Self-pay | Admitting: Oncology

## 2019-12-28 ENCOUNTER — Other Ambulatory Visit: Payer: Self-pay

## 2019-12-28 ENCOUNTER — Telehealth: Payer: Self-pay | Admitting: *Deleted

## 2019-12-28 ENCOUNTER — Inpatient Hospital Stay: Payer: BC Managed Care – PPO

## 2019-12-28 ENCOUNTER — Inpatient Hospital Stay (HOSPITAL_BASED_OUTPATIENT_CLINIC_OR_DEPARTMENT_OTHER): Payer: BC Managed Care – PPO | Admitting: Oncology

## 2019-12-28 VITALS — BP 118/70 | HR 73 | Temp 98.7°F | Resp 20 | Ht 74.0 in | Wt 253.3 lb

## 2019-12-28 DIAGNOSIS — C9000 Multiple myeloma not having achieved remission: Secondary | ICD-10-CM

## 2019-12-28 LAB — CBC WITH DIFFERENTIAL (CANCER CENTER ONLY)
Abs Immature Granulocytes: 0.07 10*3/uL (ref 0.00–0.07)
Basophils Absolute: 0.1 10*3/uL (ref 0.0–0.1)
Basophils Relative: 2 %
Eosinophils Absolute: 0.2 10*3/uL (ref 0.0–0.5)
Eosinophils Relative: 3 %
HCT: 42.6 % (ref 39.0–52.0)
Hemoglobin: 14.7 g/dL (ref 13.0–17.0)
Immature Granulocytes: 1 %
Lymphocytes Relative: 12 %
Lymphs Abs: 0.7 10*3/uL (ref 0.7–4.0)
MCH: 30.9 pg (ref 26.0–34.0)
MCHC: 34.5 g/dL (ref 30.0–36.0)
MCV: 89.5 fL (ref 80.0–100.0)
Monocytes Absolute: 0.8 10*3/uL (ref 0.1–1.0)
Monocytes Relative: 13 %
Neutro Abs: 4.2 10*3/uL (ref 1.7–7.7)
Neutrophils Relative %: 69 %
Platelet Count: 146 10*3/uL — ABNORMAL LOW (ref 150–400)
RBC: 4.76 MIL/uL (ref 4.22–5.81)
RDW: 14.5 % (ref 11.5–15.5)
WBC Count: 6.1 10*3/uL (ref 4.0–10.5)
nRBC: 0 % (ref 0.0–0.2)

## 2019-12-28 LAB — CMP (CANCER CENTER ONLY)
ALT: 34 U/L (ref 0–44)
AST: 15 U/L (ref 15–41)
Albumin: 3.7 g/dL (ref 3.5–5.0)
Alkaline Phosphatase: 51 U/L (ref 38–126)
Anion gap: 12 (ref 5–15)
BUN: 12 mg/dL (ref 6–20)
CO2: 24 mmol/L (ref 22–32)
Calcium: 8 mg/dL — ABNORMAL LOW (ref 8.9–10.3)
Chloride: 104 mmol/L (ref 98–111)
Creatinine: 1.02 mg/dL (ref 0.61–1.24)
GFR, Est AFR Am: >60 mL/min (ref >60)
GFR, Estimated: >60 mL/min (ref >60)
Glucose, Bld: 206 mg/dL — ABNORMAL HIGH (ref 70–99)
Potassium: 4 mmol/L (ref 3.5–5.1)
Sodium: 140 mmol/L (ref 135–145)
Total Bilirubin: 0.4 mg/dL (ref 0.3–1.2)
Total Protein: 5.8 g/dL — ABNORMAL LOW (ref 6.5–8.1)

## 2019-12-28 MED ORDER — DARATUMUMAB-HYALURONIDASE-FIHJ 1800-30000 MG-UT/15ML ~~LOC~~ SOLN
1800.0000 mg | Freq: Once | SUBCUTANEOUS | Status: AC
  Administered 2019-12-28: 1800 mg via SUBCUTANEOUS
  Filled 2019-12-28: qty 15

## 2019-12-28 MED ORDER — DIPHENHYDRAMINE HCL 25 MG PO CAPS
50.0000 mg | ORAL_CAPSULE | Freq: Once | ORAL | Status: AC
  Administered 2019-12-28: 50 mg via ORAL

## 2019-12-28 MED ORDER — ACETAMINOPHEN 325 MG PO TABS
ORAL_TABLET | ORAL | Status: AC
  Filled 2019-12-28: qty 2

## 2019-12-28 MED ORDER — ACETAMINOPHEN 325 MG PO TABS
650.0000 mg | ORAL_TABLET | Freq: Once | ORAL | Status: AC
  Administered 2019-12-28: 650 mg via ORAL

## 2019-12-28 MED ORDER — DEXAMETHASONE 4 MG PO TABS
20.0000 mg | ORAL_TABLET | Freq: Once | ORAL | Status: AC
  Administered 2019-12-28: 20 mg via ORAL

## 2019-12-28 MED ORDER — DIPHENHYDRAMINE HCL 25 MG PO CAPS
ORAL_CAPSULE | ORAL | Status: AC
  Filled 2019-12-28: qty 2

## 2019-12-28 MED ORDER — DEXAMETHASONE 4 MG PO TABS
ORAL_TABLET | ORAL | Status: AC
  Filled 2019-12-28: qty 5

## 2019-12-28 NOTE — Progress Notes (Signed)
Per Dr. Benay Spice: OK to proceed with treatment today. Hold dexamethasone tomorrow. Follow up with PCP about need for a sliding scale at home.

## 2019-12-28 NOTE — Telephone Encounter (Signed)
At request of Dr. Benay Spice, called [facility] to inquire that if home dexamethasone is discontinued on tx weeks, does he need it at home on off week. Per NP: Only give dexamethasone on day of treatment.

## 2019-12-28 NOTE — Patient Instructions (Addendum)
Do not take her dexamethasone tomorrow due to elevated glucose. You need to follow up with PCP regarding possible need for sliding scale at home.  [facility] Discharge Instructions for Patients Receiving Chemotherapy  Today you received the following chemotherapy agents :  Darzalex  Faspro.  To help prevent nausea and vomiting after your treatment, we encourage you to take your nausea medication as prescribed.   If you develop nausea and vomiting that is not controlled by your nausea medication, call the clinic.   BELOW ARE SYMPTOMS THAT SHOULD BE REPORTED IMMEDIATELY:  *FEVER GREATER THAN 100.5 F  *CHILLS WITH OR WITHOUT FEVER  NAUSEA AND VOMITING THAT IS NOT CONTROLLED WITH YOUR NAUSEA MEDICATION  *UNUSUAL SHORTNESS OF BREATH  *UNUSUAL BRUISING OR BLEEDING  TENDERNESS IN MOUTH AND THROAT WITH OR WITHOUT PRESENCE OF ULCERS  *URINARY PROBLEMS  *BOWEL PROBLEMS  UNUSUAL RASH Items with * indicate a potential emergency and should be followed up as soon as possible.  Feel free to call the clinic should you have any questions or concerns. The clinic phone number is [phone].  Please show the [place] at check-in to the Emergency Department and triage nurse.

## 2019-12-28 NOTE — Progress Notes (Signed)
[place] OFFICE VISIT PROGRESS NOTE  I connected with Steve Carter on 12/28/19 at 10:00 AM EST by video and verified that I am speaking with the correct person using two identifiers.   I discussed the limitations, risks, security and privacy concerns of performing an evaluation and management service by telemedicine and the availability of in-person appointments. I also discussed with the patient that there may be a patient responsible charge related to this service. The patient expressed understanding and agreed to proceed.    Patient's location: Office Provider's location: Home    Diagnosis: Multiple myeloma  INTERVAL HISTORY:   Steve Carter returns as scheduled.  He began another cycle of pomalidomide on 12/14/2019.  No nausea, diarrhea, dyspnea, or pain.  He feels well.  Good appetite.  He reports his blood sugars under better control since the Metformin was increased by his primary provider.  He reports the blood sugar has been running in the 150-160 range.  No higher readings. He was seen at [facility] last week.  They suggest discontinuing the at home Decadron. Objective:  Vital signs in last 24 hours:  Blood pressure 118/70, pulse 73, temperature 98.7 F (37.1 C), temperature source Temporal, resp. rate 20, height '6\' 2"'$  (1.88 m), weight 253 lb 4.8 oz (114.9 kg), SpO2 98 %.       Lab Results:  Lab Results  Component Value Date   WBC 6.1 12/28/2019   HGB 14.7 12/28/2019   HCT 42.6 12/28/2019   MCV 89.5 12/28/2019   PLT 146 (L) 12/28/2019   NEUTROABS 4.2 12/28/2019     Medications: I have reviewed the patient's current medications.  Assessment/Plan: 1.Multiple myeloma-IgG lambda serum monoclonal protein, elevated serum lambda light chains  Bone survey 10/11/2017-lytic lesions noted in the skull, right iliac, left second rib, and mottled appearance of the proximal femurs/pelvis  Bone marrow biopsy  10/14/2017-hypercellular marrow with plasma cell neoplasm, 82% plasma cells, lambda light chain restricted, hyperdiploid with gains of chromosomes 3, 5, 7, 9, and 11. FISH panel positive for gain of ATM(+11)  Cycle 1 RVD 10/18/2017 (Revlimid started 10/23/2017)  Cycle 2 RVD 11/15/2017  Cycle 3 RVD 12/13/2017  Cycle 4 RVD 01/14/2018  Cycle 5 of RVD 02/14/2018 (Revlimid given for 7 days and 1 week of Velcade), therapy held beginning 02/21/2018 secondary to plan for stem cell therapy  Bone marrow biopsy 02/20/2018, 1-2% plasma cell  PET scan 02/20/2018, no malignant range activity above background, numerous lytic lesions throughout the axial and appendicular skeleton,, left iliac wing fracture  Melphalan, 200 mg/m on 03/24/2018  Stem cell infusion 03/25/2018  Restaging at [facility] 07/09/2018: Normal lambda light chains, no serum M spike, bone marrow biopsy negative for myeloma, less than 1% plasma cells  PET scan at [facility] 07/09/2018-lytic bone lesions, no malignant range activity  Initiation of maintenance Revlimid, 10 mg, 21/28 days 08/15/2018  Cycle 2 maintenance Revlimid 09/12/2018  Cycle 3 maintenance Revlimid 10/10/2018  Revlimid placed on hold 06/15/2019 due to presyncopal/syncopal episodes and diarrhea  Revlimid resumed 5 mg 21 days on/7 days off following office visit 06/29/2019  Revlimidplaced on hold 07/14/2019  Revlimid resumed 08/21/2019  Bone survey 09/09/2019-no acute findings or clear explanation for right buttock pain. Lucent lesions in the right pelvis are stable without pathologic fracture. Evidence of healing lytic lesions in the right scapula, cervical spine spinous processes and left L4 transverse process. Possible mild progression of lytic lesions within the L1 and L4 vertebral  bodies. Stable lytic lesions in the calvarium.  PET scan 09/25/2019-new FDG avid bone lesions in the right humeral neck and sacrum similar remaining lytic lesions with FDG activity  below background  Bone marrow biopsy at [facility] on 10/01/2019-5% plasma cells on the bone marrow biopsy suboptimal sample  Cycle 1 daratumumab, pomalidomide, Decadron 10/19/2019  Zometa 10/19/2019  Cycle 2 daratumumab, pomalidomide, Decadron 11/16/2019 (he will begin the pomalidomide 11/17/2019)  Cycle 3 daratumumab, pomalidomide, and Decadron 12/14/2019   2.Pain secondary to multiple myeloma involving the spine and pelvis-resolved  MRI of the lumbar spine 10/02/2018-numerous rounded foci in the vertebral bodies, hypertrophy of the L4 transverse process  3.Hypertension-losartan dose increased 11/15/2017  4.Depression-improved with Wellbutrin beginning September 2020  5.Altered mental status-improved depression related? 6. Diabetes   Disposition: Steve Carter appears well.  He will complete the current cycle of pomalidomide this week.  He is now maintained on every 2-week daratumumab.  He is tolerating the treatment well.  His blood sugar appears to be under better control with the increased dose of Metformin.  He is also using insulin as needed.  He will discontinue the at home Decadron on the day after daratumumab.  Steve Carter will begin the next cycle of pomalidomide on 01/11/2020.  He will be scheduled for an office visit and Zometa that day.   I discussed the assessment and treatment plan with the patient. The patient was provided an opportunity to ask questions and all were answered. The patient agreed with the plan and demonstrated an understanding of the instructions.   The patient was advised to call back or seek an in-person evaluation if the symptoms worsen or if the condition fails to improve as anticipated.  I provided 20 minutes of video, chart review, and documentation time during this encounter, and > 50% was spent counseling as documented under my assessment & plan.  Betsy Coder ANP/GNP-BC   12/28/2019 10:12 AM

## 2019-12-29 LAB — PROTEIN ELECTROPHORESIS, SERUM
A/G Ratio: 1.9 — ABNORMAL HIGH (ref 0.7–1.7)
Albumin ELP: 3.7 g/dL (ref 2.9–4.4)
Alpha-1-Globulin: 0.1 g/dL (ref 0.0–0.4)
Alpha-2-Globulin: 0.8 g/dL (ref 0.4–1.0)
Beta Globulin: 0.8 g/dL (ref 0.7–1.3)
Gamma Globulin: 0.2 g/dL — ABNORMAL LOW (ref 0.4–1.8)
Globulin, Total: 1.9 g/dL — ABNORMAL LOW (ref 2.2–3.9)
Total Protein ELP: 5.6 g/dL — ABNORMAL LOW (ref 6.0–8.5)

## 2019-12-29 LAB — KAPPA/LAMBDA LIGHT CHAINS
Kappa free light chain: 7.4 mg/L (ref 3.3–19.4)
Kappa, lambda light chain ratio: 0.69 (ref 0.26–1.65)
Lambda free light chains: 10.7 mg/L (ref 5.7–26.3)

## 2019-12-31 ENCOUNTER — Encounter: Payer: Self-pay | Admitting: Nurse Practitioner

## 2020-01-01 ENCOUNTER — Encounter: Payer: Self-pay | Admitting: *Deleted

## 2020-01-01 ENCOUNTER — Other Ambulatory Visit: Payer: Self-pay | Admitting: Oncology

## 2020-01-01 DIAGNOSIS — C9000 Multiple myeloma not having achieved remission: Secondary | ICD-10-CM

## 2020-01-05 ENCOUNTER — Other Ambulatory Visit: Payer: Self-pay | Admitting: Oncology

## 2020-01-08 ENCOUNTER — Telehealth: Payer: Self-pay | Admitting: Oncology

## 2020-01-08 NOTE — Telephone Encounter (Signed)
Called and spoke with patients wife. Confirmed 2/9 appt

## 2020-01-10 ENCOUNTER — Other Ambulatory Visit: Payer: Self-pay | Admitting: Oncology

## 2020-01-11 ENCOUNTER — Telehealth: Payer: Self-pay | Admitting: Adult Health

## 2020-01-11 ENCOUNTER — Ambulatory Visit: Payer: BC Managed Care – PPO | Admitting: Nurse Practitioner

## 2020-01-11 ENCOUNTER — Other Ambulatory Visit: Payer: BC Managed Care – PPO

## 2020-01-11 NOTE — Telephone Encounter (Signed)
Pt spouse left a msg for Steve Carter to call concerning prescriptions. He hasn't been seen at [facility].

## 2020-01-12 ENCOUNTER — Other Ambulatory Visit: Payer: Self-pay

## 2020-01-12 ENCOUNTER — Inpatient Hospital Stay: Payer: BC Managed Care – PPO

## 2020-01-12 ENCOUNTER — Inpatient Hospital Stay (HOSPITAL_BASED_OUTPATIENT_CLINIC_OR_DEPARTMENT_OTHER): Payer: BC Managed Care – PPO | Admitting: Oncology

## 2020-01-12 ENCOUNTER — Inpatient Hospital Stay: Payer: BC Managed Care – PPO | Attending: Oncology

## 2020-01-12 VITALS — BP 102/74 | HR 89 | Temp 98.0°F | Resp 20 | Ht 74.0 in | Wt 250.1 lb

## 2020-01-12 DIAGNOSIS — E119 Type 2 diabetes mellitus without complications: Secondary | ICD-10-CM | POA: Insufficient documentation

## 2020-01-12 DIAGNOSIS — G893 Neoplasm related pain (acute) (chronic): Secondary | ICD-10-CM | POA: Diagnosis not present

## 2020-01-12 DIAGNOSIS — R55 Syncope and collapse: Secondary | ICD-10-CM | POA: Insufficient documentation

## 2020-01-12 DIAGNOSIS — F329 Major depressive disorder, single episode, unspecified: Secondary | ICD-10-CM | POA: Insufficient documentation

## 2020-01-12 DIAGNOSIS — I10 Essential (primary) hypertension: Secondary | ICD-10-CM | POA: Diagnosis not present

## 2020-01-12 DIAGNOSIS — Z5112 Encounter for antineoplastic immunotherapy: Secondary | ICD-10-CM | POA: Diagnosis present

## 2020-01-12 DIAGNOSIS — Z79899 Other long term (current) drug therapy: Secondary | ICD-10-CM | POA: Insufficient documentation

## 2020-01-12 DIAGNOSIS — C9000 Multiple myeloma not having achieved remission: Secondary | ICD-10-CM | POA: Insufficient documentation

## 2020-01-12 LAB — CMP (CANCER CENTER ONLY)
ALT: 39 U/L (ref 0–44)
AST: 16 U/L (ref 15–41)
Albumin: 4.1 g/dL (ref 3.5–5.0)
Alkaline Phosphatase: 57 U/L (ref 38–126)
Anion gap: 14 (ref 5–15)
BUN: 20 mg/dL (ref 6–20)
CO2: 25 mmol/L (ref 22–32)
Calcium: 8.7 mg/dL — ABNORMAL LOW (ref 8.9–10.3)
Chloride: 101 mmol/L (ref 98–111)
Creatinine: 1.28 mg/dL — ABNORMAL HIGH (ref 0.61–1.24)
GFR, Est AFR Am: >60 mL/min (ref >60)
GFR, Estimated: >60 mL/min (ref >60)
Glucose, Bld: 192 mg/dL — ABNORMAL HIGH (ref 70–99)
Potassium: 3.9 mmol/L (ref 3.5–5.1)
Sodium: 140 mmol/L (ref 135–145)
Total Bilirubin: 0.4 mg/dL (ref 0.3–1.2)
Total Protein: 6.7 g/dL (ref 6.5–8.1)

## 2020-01-12 LAB — CBC WITH DIFFERENTIAL (CANCER CENTER ONLY)
Abs Immature Granulocytes: 0.09 10*3/uL — ABNORMAL HIGH (ref 0.00–0.07)
Basophils Absolute: 0.1 10*3/uL (ref 0.0–0.1)
Basophils Relative: 2 %
Eosinophils Absolute: 0.1 10*3/uL (ref 0.0–0.5)
Eosinophils Relative: 2 %
HCT: 44.5 % (ref 39.0–52.0)
Hemoglobin: 15.8 g/dL (ref 13.0–17.0)
Immature Granulocytes: 2 %
Lymphocytes Relative: 17 %
Lymphs Abs: 1 10*3/uL (ref 0.7–4.0)
MCH: 31.9 pg (ref 26.0–34.0)
MCHC: 35.5 g/dL (ref 30.0–36.0)
MCV: 89.9 fL (ref 80.0–100.0)
Monocytes Absolute: 1.2 10*3/uL — ABNORMAL HIGH (ref 0.1–1.0)
Monocytes Relative: 20 %
Neutro Abs: 3.4 10*3/uL (ref 1.7–7.7)
Neutrophils Relative %: 57 %
Platelet Count: 220 10*3/uL (ref 150–400)
RBC: 4.95 MIL/uL (ref 4.22–5.81)
RDW: 15.9 % — ABNORMAL HIGH (ref 11.5–15.5)
WBC Count: 5.9 10*3/uL (ref 4.0–10.5)
nRBC: 0 % (ref 0.0–0.2)

## 2020-01-12 MED ORDER — DARATUMUMAB-HYALURONIDASE-FIHJ 1800-30000 MG-UT/15ML ~~LOC~~ SOLN
1800.0000 mg | Freq: Once | SUBCUTANEOUS | Status: AC
  Administered 2020-01-12: 1800 mg via SUBCUTANEOUS
  Filled 2020-01-12: qty 15

## 2020-01-12 MED ORDER — DEXAMETHASONE 4 MG PO TABS
20.0000 mg | ORAL_TABLET | Freq: Once | ORAL | Status: AC
  Administered 2020-01-12: 20 mg via ORAL

## 2020-01-12 MED ORDER — ZOLEDRONIC ACID 4 MG/100ML IV SOLN
4.0000 mg | Freq: Once | INTRAVENOUS | Status: AC
  Administered 2020-01-12: 4 mg via INTRAVENOUS

## 2020-01-12 MED ORDER — DEXAMETHASONE 4 MG PO TABS
ORAL_TABLET | ORAL | Status: AC
  Filled 2020-01-12: qty 5

## 2020-01-12 MED ORDER — SODIUM CHLORIDE 0.9 % IV SOLN
INTRAVENOUS | Status: DC
  Filled 2020-01-12: qty 250

## 2020-01-12 MED ORDER — DIPHENHYDRAMINE HCL 25 MG PO CAPS
ORAL_CAPSULE | ORAL | Status: AC
  Filled 2020-01-12: qty 2

## 2020-01-12 MED ORDER — DIPHENHYDRAMINE HCL 25 MG PO CAPS
50.0000 mg | ORAL_CAPSULE | Freq: Once | ORAL | Status: AC
  Administered 2020-01-12: 50 mg via ORAL

## 2020-01-12 MED ORDER — SODIUM CHLORIDE 0.9 % IV SOLN
Freq: Once | INTRAVENOUS | Status: AC
  Filled 2020-01-12: qty 250

## 2020-01-12 MED ORDER — ACETAMINOPHEN 325 MG PO TABS
ORAL_TABLET | ORAL | Status: AC
  Filled 2020-01-12: qty 2

## 2020-01-12 MED ORDER — ACETAMINOPHEN 325 MG PO TABS
650.0000 mg | ORAL_TABLET | Freq: Once | ORAL | Status: AC
  Administered 2020-01-12: 650 mg via ORAL

## 2020-01-12 MED ORDER — ZOLEDRONIC ACID 4 MG/100ML IV SOLN
INTRAVENOUS | Status: AC
  Filled 2020-01-12: qty 100

## 2020-01-12 NOTE — Progress Notes (Signed)
[place] OFFICE PROGRESS NOTE   Diagnosis: Multiple myeloma  INTERVAL HISTORY:   [name] returns as scheduled.  He began another cycle of pomalidomide yesterday.  No pain.  Good appetite.  He feels well.  He reports 25 episodes of feeling tremulous and presyncope with "falls "over the past 2 months.  He had syncope on one episode.  He reports he has an intermittent feeling of presyncope when standing that resolves after several seconds.  He has been alert throughout these episodes except for the one occasion.  He reports his blood sugar is under better control.  He is no longer taking the Decadron on the day after daratumumab.  Objective:  Vital signs in last 24 hours:  Blood pressure 125/71, pulse 80, temperature 98 F (36.7 C), temperature source Temporal, resp. rate 20, height '6\' 2"'$  (1.88 m), weight 250 lb 1.6 oz (113.4 kg), SpO2 100 %.    HEENT: No thrush Resp: Lungs clear bilaterally Cardio: Regular rate and rhythm GI: No hepatosplenomegaly, nontender Vascular: No leg edema Neuro: Alert and oriented, he ambulated to the examination table without difficulty, finger-to-nose testing is normal, the motor exam appears intact in the upper and lower extremities bilaterally Skin: Few small ecchymoses at the left flank and right subcostal regions   Lab Results:  Lab Results  Component Value Date   WBC 5.9 01/12/2020   HGB 15.8 01/12/2020   HCT 44.5 01/12/2020   MCV 89.9 01/12/2020   PLT 220 01/12/2020   NEUTROABS 3.4 01/12/2020    CMP  Lab Results  Component Value Date   NA 140 01/12/2020   K 3.9 01/12/2020   CL 101 01/12/2020   CO2 25 01/12/2020   GLUCOSE 192 (H) 01/12/2020   BUN 20 01/12/2020   CREATININE 1.28 (H) 01/12/2020   CALCIUM 8.7 (L) 01/12/2020   PROT 6.7 01/12/2020   ALBUMIN 4.1 01/12/2020   AST 16 01/12/2020   ALT 39 01/12/2020   ALKPHOS 57 01/12/2020   BILITOT 0.4 01/12/2020   GFRNONAA >60 01/12/2020   GFRAA >60 01/12/2020    12/28/2019: Serum M spike not observed, lambda free light chains 10.7  Medications: I have reviewed the patient's current medications.   Assessment/Plan: 1.Multiple myeloma-IgG lambda serum monoclonal protein, elevated serum lambda light chains  Bone survey 10/11/2017-lytic lesions noted in the skull, right iliac, left second rib, and mottled appearance of the proximal femurs/pelvis  Bone marrow biopsy 10/14/2017-hypercellular marrow with plasma cell neoplasm, 82% plasma cells, lambda light chain restricted, hyperdiploid with gains of chromosomes 3, 5, 7, 9, and 11. FISH panel positive for gain of ATM(+11)  Cycle 1 RVD 10/18/2017 (Revlimid started 10/23/2017)  Cycle 2 RVD 11/15/2017  Cycle 3 RVD 12/13/2017  Cycle 4 RVD 01/14/2018  Cycle 5 of RVD 02/14/2018 (Revlimid given for 7 days and 1 week of Velcade), therapy held beginning 02/21/2018 secondary to plan for stem cell therapy  Bone marrow biopsy 02/20/2018, 1-2% plasma cell  PET scan 02/20/2018, no malignant range activity above background, numerous lytic lesions throughout the axial and appendicular skeleton,, left iliac wing fracture  Melphalan, 200 mg/m on 03/24/2018  Stem cell infusion 03/25/2018  Restaging at [facility] 07/09/2018: Normal lambda light chains, no serum M spike, bone marrow biopsy negative for myeloma, less than 1% plasma cells  PET scan at [facility] 07/09/2018-lytic bone lesions, no malignant range activity  Initiation of maintenance Revlimid, 10 mg, 21/28 days 08/15/2018  Cycle 2 maintenance Revlimid 09/12/2018  Cycle 3 maintenance Revlimid  10/10/2018  Revlimid placed on hold 06/15/2019 due to presyncopal/syncopal episodes and diarrhea  Revlimid resumed 5 mg 21 days on/7 days off following office visit 06/29/2019  Revlimidplaced on hold 07/14/2019  Revlimid resumed 08/21/2019  Bone survey 09/09/2019-no acute findings or clear explanation for right buttock pain. Lucent lesions in the right pelvis are  stable without pathologic fracture. Evidence of healing lytic lesions in the right scapula, cervical spine spinous processes and left L4 transverse process. Possible mild progression of lytic lesions within the L1 and L4 vertebral bodies. Stable lytic lesions in the calvarium.  PET scan 09/25/2019-new FDG avid bone lesions in the right humeral neck and sacrum similar remaining lytic lesions with FDG activity below background  Bone marrow biopsy at [facility] on 10/01/2019-5% plasma cells on the bone marrow biopsy suboptimal sample  Cycle 1 daratumumab, pomalidomide, Decadron 10/19/2019  Zometa 10/19/2019  Cycle 2 daratumumab, pomalidomide, Decadron 11/16/2019 (he will begin the pomalidomide 11/17/2019)  Cycle 3 daratumumab, pomalidomide, and Decadron 12/14/2019  Cycle 4 daratumumab, pomalidomide, and Decadron 01/11/2020   2.Pain secondary to multiple myeloma involving the spine and pelvis-resolved  MRI of the lumbar spine 10/02/2018-numerous rounded foci in the vertebral bodies, hypertrophy of the L4 transverse process  3.Hypertension-losartan dose increased 11/15/2017  4.Depression-improved with Wellbutrin beginning September 2020  5.Altered mental status-improved depression related? 6. Diabetes      Disposition: Mr. Blough began cycle 4 pomalidomide yesterday.  He will be treated with daratumumab today.  He is in clinical remission for myeloma.  The lambda free light chains are normal and no serum M spike was detected when he was here on 12/28/2019.  I cannot relate the presyncope/syncope events to his medical regimen.  He does not describe clear seizure activity.  I wonder whether his symptoms are related to orthostasis.  He is maintained on 3 antihypertensive medications.  We will check orthostatic vital signs today.  He has been referred to neurology at [facility] and is scheduled to be seen on 01/19/2020.  I recommended he push oral hydration and get up  slowly from a seated position.  He will be scheduled for an office visit in daratumumab in 2 weeks.  Betsy Coder, MD  01/12/2020  8:46 AM

## 2020-01-12 NOTE — Telephone Encounter (Signed)
Pt. Has been seen here. He is starting to shake in the mornings and has recently started this about December. Has had 5 episodes. Could this be from any of the medications that he is currently taking from you? They were wondering before they go see his PCP about these episodes.

## 2020-01-12 NOTE — Telephone Encounter (Signed)
Yes. Or he can discuss with [place]. He was to return in 4 weeks.

## 2020-01-12 NOTE — Progress Notes (Signed)
Per Dr. Benay Spice: OK to treat w/creatinine 1.28

## 2020-01-12 NOTE — Patient Instructions (Signed)
[place] Discharge Instructions for Patients Receiving Chemotherapy  Today you received the following chemotherapy agents Darzalex Faspro and Zometa.    To help prevent nausea and vomiting after your treatment, we encourage you to take your nausea medication as directed.    If you develop nausea and vomiting that is not controlled by your nausea medication, call the clinic.   BELOW ARE SYMPTOMS THAT SHOULD BE REPORTED IMMEDIATELY:  *FEVER GREATER THAN 100.5 F  *CHILLS WITH OR WITHOUT FEVER  NAUSEA AND VOMITING THAT IS NOT CONTROLLED WITH YOUR NAUSEA MEDICATION  *UNUSUAL SHORTNESS OF BREATH  *UNUSUAL BRUISING OR BLEEDING  TENDERNESS IN MOUTH AND THROAT WITH OR WITHOUT PRESENCE OF ULCERS  *URINARY PROBLEMS  *BOWEL PROBLEMS  UNUSUAL RASH Items with * indicate a potential emergency and should be followed up as soon as possible.  Feel free to call the clinic should you have any questions or concerns. The clinic phone number is [phone].  Please show the [place] at check-in to the Emergency Department and triage nurse.

## 2020-01-12 NOTE — Telephone Encounter (Signed)
Would you suggest seeing his PCP about symptoms?

## 2020-01-12 NOTE — Telephone Encounter (Signed)
I saw him once in August as a new patient and he was already on medications. He was supposed to return in 4 weeks and did not.

## 2020-01-12 NOTE — Telephone Encounter (Signed)
Pt's wife made aware. She will let his Cancer doctor take care of it for now and schedule a follow up at some point.

## 2020-01-13 LAB — KAPPA/LAMBDA LIGHT CHAINS
Kappa free light chain: 6.8 mg/L (ref 3.3–19.4)
Kappa, lambda light chain ratio: 0.78 (ref 0.26–1.65)
Lambda free light chains: 8.7 mg/L (ref 5.7–26.3)

## 2020-01-13 LAB — PROTEIN ELECTROPHORESIS, SERUM
A/G Ratio: 1.5 (ref 0.7–1.7)
Albumin ELP: 3.7 g/dL (ref 2.9–4.4)
Alpha-1-Globulin: 0.2 g/dL (ref 0.0–0.4)
Alpha-2-Globulin: 0.9 g/dL (ref 0.4–1.0)
Beta Globulin: 1 g/dL (ref 0.7–1.3)
Gamma Globulin: 0.3 g/dL — ABNORMAL LOW (ref 0.4–1.8)
Globulin, Total: 2.4 g/dL (ref 2.2–3.9)
M-Spike, %: 0.1 g/dL — ABNORMAL HIGH
Total Protein ELP: 6.1 g/dL (ref 6.0–8.5)

## 2020-01-14 ENCOUNTER — Telehealth: Payer: Self-pay | Admitting: Oncology

## 2020-01-14 NOTE — Telephone Encounter (Signed)
Scheduled per los. Called and spoke with patients wife. Confirmed appt  

## 2020-01-19 DIAGNOSIS — R296 Repeated falls: Secondary | ICD-10-CM | POA: Insufficient documentation

## 2020-01-19 DIAGNOSIS — G250 Essential tremor: Secondary | ICD-10-CM | POA: Insufficient documentation

## 2020-01-19 DIAGNOSIS — R4020 Unspecified coma: Secondary | ICD-10-CM | POA: Insufficient documentation

## 2020-01-20 ENCOUNTER — Encounter: Payer: Self-pay | Admitting: Nurse Practitioner

## 2020-01-24 ENCOUNTER — Other Ambulatory Visit: Payer: Self-pay | Admitting: Oncology

## 2020-01-25 ENCOUNTER — Encounter: Payer: Self-pay | Admitting: Oncology

## 2020-01-26 ENCOUNTER — Inpatient Hospital Stay: Payer: BC Managed Care – PPO

## 2020-01-26 ENCOUNTER — Inpatient Hospital Stay: Payer: BC Managed Care – PPO | Admitting: Nurse Practitioner

## 2020-01-29 ENCOUNTER — Telehealth: Payer: Self-pay | Admitting: Oncology

## 2020-01-29 NOTE — Telephone Encounter (Signed)
Scheduled appt per 2/25 sch message- left message for patient with new appt date and time

## 2020-01-30 ENCOUNTER — Other Ambulatory Visit: Payer: Self-pay | Admitting: Oncology

## 2020-01-30 DIAGNOSIS — C9000 Multiple myeloma not having achieved remission: Secondary | ICD-10-CM

## 2020-02-03 ENCOUNTER — Other Ambulatory Visit: Payer: Self-pay

## 2020-02-03 ENCOUNTER — Inpatient Hospital Stay (HOSPITAL_BASED_OUTPATIENT_CLINIC_OR_DEPARTMENT_OTHER): Payer: BC Managed Care – PPO | Admitting: Nurse Practitioner

## 2020-02-03 ENCOUNTER — Encounter: Payer: Self-pay | Admitting: Nurse Practitioner

## 2020-02-03 ENCOUNTER — Inpatient Hospital Stay: Payer: BC Managed Care – PPO

## 2020-02-03 ENCOUNTER — Inpatient Hospital Stay: Payer: BC Managed Care – PPO | Attending: Nurse Practitioner

## 2020-02-03 VITALS — BP 111/71 | HR 65 | Temp 98.2°F | Resp 18 | Ht 74.0 in | Wt 256.3 lb

## 2020-02-03 DIAGNOSIS — C9000 Multiple myeloma not having achieved remission: Secondary | ICD-10-CM | POA: Diagnosis not present

## 2020-02-03 DIAGNOSIS — R55 Syncope and collapse: Secondary | ICD-10-CM | POA: Diagnosis not present

## 2020-02-03 DIAGNOSIS — I10 Essential (primary) hypertension: Secondary | ICD-10-CM | POA: Insufficient documentation

## 2020-02-03 DIAGNOSIS — G893 Neoplasm related pain (acute) (chronic): Secondary | ICD-10-CM | POA: Insufficient documentation

## 2020-02-03 DIAGNOSIS — I6782 Cerebral ischemia: Secondary | ICD-10-CM | POA: Insufficient documentation

## 2020-02-03 DIAGNOSIS — R197 Diarrhea, unspecified: Secondary | ICD-10-CM | POA: Diagnosis not present

## 2020-02-03 DIAGNOSIS — R4182 Altered mental status, unspecified: Secondary | ICD-10-CM | POA: Diagnosis not present

## 2020-02-03 DIAGNOSIS — Z79899 Other long term (current) drug therapy: Secondary | ICD-10-CM | POA: Diagnosis not present

## 2020-02-03 DIAGNOSIS — Z5112 Encounter for antineoplastic immunotherapy: Secondary | ICD-10-CM | POA: Insufficient documentation

## 2020-02-03 DIAGNOSIS — F329 Major depressive disorder, single episode, unspecified: Secondary | ICD-10-CM | POA: Diagnosis not present

## 2020-02-03 DIAGNOSIS — E119 Type 2 diabetes mellitus without complications: Secondary | ICD-10-CM | POA: Diagnosis not present

## 2020-02-03 DIAGNOSIS — R251 Tremor, unspecified: Secondary | ICD-10-CM | POA: Diagnosis not present

## 2020-02-03 LAB — BASIC METABOLIC PANEL - CANCER CENTER ONLY
Anion gap: 14 (ref 5–15)
BUN: 20 mg/dL (ref 6–20)
CO2: 25 mmol/L (ref 22–32)
Calcium: 7.8 mg/dL — ABNORMAL LOW (ref 8.9–10.3)
Chloride: 101 mmol/L (ref 98–111)
Creatinine: 1.44 mg/dL — ABNORMAL HIGH (ref 0.61–1.24)
GFR, Est AFR Am: >60 mL/min (ref >60)
GFR, Estimated: 55 mL/min — ABNORMAL LOW (ref >60)
Glucose, Bld: 216 mg/dL — ABNORMAL HIGH (ref 70–99)
Potassium: 3.8 mmol/L (ref 3.5–5.1)
Sodium: 140 mmol/L (ref 135–145)

## 2020-02-03 LAB — CBC WITH DIFFERENTIAL (CANCER CENTER ONLY)
Abs Immature Granulocytes: 0.04 10*3/uL (ref 0.00–0.07)
Basophils Absolute: 0.1 10*3/uL (ref 0.0–0.1)
Basophils Relative: 2 %
Eosinophils Absolute: 0.3 10*3/uL (ref 0.0–0.5)
Eosinophils Relative: 6 %
HCT: 38.4 % — ABNORMAL LOW (ref 39.0–52.0)
Hemoglobin: 13.6 g/dL (ref 13.0–17.0)
Immature Granulocytes: 1 %
Lymphocytes Relative: 16 %
Lymphs Abs: 0.7 10*3/uL (ref 0.7–4.0)
MCH: 32.6 pg (ref 26.0–34.0)
MCHC: 35.4 g/dL (ref 30.0–36.0)
MCV: 92.1 fL (ref 80.0–100.0)
Monocytes Absolute: 0.8 10*3/uL (ref 0.1–1.0)
Monocytes Relative: 18 %
Neutro Abs: 2.3 10*3/uL (ref 1.7–7.7)
Neutrophils Relative %: 57 %
Platelet Count: 134 10*3/uL — ABNORMAL LOW (ref 150–400)
RBC: 4.17 MIL/uL — ABNORMAL LOW (ref 4.22–5.81)
RDW: 16.5 % — ABNORMAL HIGH (ref 11.5–15.5)
WBC Count: 4.2 10*3/uL (ref 4.0–10.5)
nRBC: 0 % (ref 0.0–0.2)

## 2020-02-03 MED ORDER — ACETAMINOPHEN 325 MG PO TABS
650.0000 mg | ORAL_TABLET | Freq: Once | ORAL | Status: AC
  Administered 2020-02-03: 650 mg via ORAL

## 2020-02-03 MED ORDER — DIPHENHYDRAMINE HCL 25 MG PO CAPS
ORAL_CAPSULE | ORAL | Status: AC
  Filled 2020-02-03: qty 2

## 2020-02-03 MED ORDER — DIPHENHYDRAMINE HCL 25 MG PO CAPS
50.0000 mg | ORAL_CAPSULE | Freq: Once | ORAL | Status: AC
  Administered 2020-02-03: 50 mg via ORAL

## 2020-02-03 MED ORDER — DARATUMUMAB-HYALURONIDASE-FIHJ 1800-30000 MG-UT/15ML ~~LOC~~ SOLN
1800.0000 mg | Freq: Once | SUBCUTANEOUS | Status: AC
  Administered 2020-02-03: 1800 mg via SUBCUTANEOUS
  Filled 2020-02-03: qty 15

## 2020-02-03 MED ORDER — ACETAMINOPHEN 325 MG PO TABS
ORAL_TABLET | ORAL | Status: AC
  Filled 2020-02-03: qty 2

## 2020-02-03 MED ORDER — DEXAMETHASONE 4 MG PO TABS
ORAL_TABLET | ORAL | Status: AC
  Filled 2020-02-03: qty 5

## 2020-02-03 MED ORDER — DEXAMETHASONE 4 MG PO TABS
20.0000 mg | ORAL_TABLET | Freq: Once | ORAL | Status: AC
  Administered 2020-02-03: 20 mg via ORAL

## 2020-02-03 MED ORDER — DEXAMETHASONE 4 MG PO TABS: ORAL_TABLET | ORAL | 1 refills | Status: DC

## 2020-02-03 NOTE — Progress Notes (Addendum)
[place] OFFICE PROGRESS NOTE   Diagnosis: Multiple myeloma  INTERVAL HISTORY:   Mr. Capek returns as scheduled.  He has completed 4 cycles of daratumumab/pomalidomide/dexamethasone.  He overall feels well.  No nausea or vomiting.  No mouth sores.  No diarrhea.  He denies pain.  He has a good appetite.  No further presyncopal episodes.  His wife reports he is scheduled for a brain MRI with contrast at [facility] on 02/06/2020.  Objective:  Vital signs in last 24 hours:  Blood pressure 111/71, pulse 65, temperature 98.2 F (36.8 C), temperature source Temporal, resp. rate 18, height _0  (1.88 m), weight 256 lb 4.8 oz (116.3 kg), SpO2 99 %.    HEENT: No thrush or ulcers. GI: Abdomen soft and nontender.  No hepatomegaly. Vascular: No leg edema.   Lab Results:  Lab Results  Component Value Date   WBC 4.2 02/03/2020   HGB 13.6 02/03/2020   HCT 38.4 (L) 02/03/2020   MCV 92.1 02/03/2020   PLT 134 (L) 02/03/2020   NEUTROABS 2.3 02/03/2020    Imaging:  No results found.  Medications: I have reviewed the patient's current medications.  Assessment/Plan: 1.Multiple myeloma-IgG lambda serum monoclonal protein, elevated serum lambda light chains  Bone survey 10/11/2017-lytic lesions noted in the skull, right iliac, left second rib, and mottled appearance of the proximal femurs/pelvis  Bone marrow biopsy 10/14/2017-hypercellular marrow with plasma cell neoplasm, 82% plasma cells, lambda light chain restricted, hyperdiploid with gains of chromosomes 3, 5, 7, 9, and 11. FISH panel positive for gain of ATM(+11)  Cycle 1 RVD 10/18/2017 (Revlimid started 10/23/2017)  Cycle 2 RVD 11/15/2017  Cycle 3 RVD 12/13/2017  Cycle 4 RVD 01/14/2018  Cycle 5 of RVD 02/14/2018 (Revlimid given for 7 days and 1 week of Velcade), therapy held beginning 02/21/2018 secondary to plan for stem cell therapy  Bone marrow biopsy 02/20/2018, 1-2% plasma cell  PET scan 02/20/2018, no  malignant range activity above background, numerous lytic lesions throughout the axial and appendicular skeleton,, left iliac wing fracture  Melphalan, 200 mg/m on 03/24/2018  Stem cell infusion 03/25/2018  Restaging at [facility] 07/09/2018: Normal lambda light chains, no serum M spike, bone marrow biopsy negative for myeloma, less than 1% plasma cells  PET scan at [facility] 07/09/2018-lytic bone lesions, no malignant range activity  Initiation of maintenance Revlimid, 10 mg, 21/28 days 08/15/2018  Cycle 2 maintenance Revlimid 09/12/2018  Cycle 3 maintenance Revlimid 10/10/2018  Revlimid placed on hold 06/15/2019 due to presyncopal/syncopal episodes and diarrhea  Revlimid resumed 5 mg 21 days on/7 days off following office visit 06/29/2019  Revlimidplaced on hold 07/14/2019  Revlimid resumed 08/21/2019  Bone survey 09/09/2019-no acute findings or clear explanation for right buttock pain. Lucent lesions in the right pelvis are stable without pathologic fracture. Evidence of healing lytic lesions in the right scapula, cervical spine spinous processes and left L4 transverse process. Possible mild progression of lytic lesions within the L1 and L4 vertebral bodies. Stable lytic lesions in the calvarium.  PET scan 09/25/2019-new FDG avid bone lesions in the right humeral neck and sacrum similar remaining lytic lesions with FDG activity below background  Bone marrow biopsy at [facility] on 10/01/2019-5% plasma cells on the bone marrow biopsy suboptimal sample  Cycle 1 daratumumab, pomalidomide, Decadron 10/19/2019  Zometa 10/19/2019  Cycle 2 daratumumab, pomalidomide, Decadron 11/16/2019 (he will begin the pomalidomide 11/17/2019)  Cycle 3 daratumumab, pomalidomide, and Decadron 12/14/2019  Cycle 4 daratumumab, pomalidomide, and Decadron 01/11/2020  Cycle 5 daratumumab, pomalidomide, Decadron 02/03/2020 (he will begin pomalidomide on 02/06/2020)   2.Pain secondary to multiple  myeloma involving the spine and pelvis-resolved  MRI of the lumbar spine 10/02/2018-numerous rounded foci in the vertebral bodies, hypertrophy of the L4 transverse process  3.Hypertension-losartan dose increased 11/15/2017  4.Depression-improved with Wellbutrin beginning September 2020  5.Altered mental status-improved depression related? 6.Diabetes   Disposition: [name] appears stable.  He has completed 4 cycles of daratumumab/pomalidomide/dexamethasone.  Plan to proceed with cycle 5 today as scheduled.  He will begin the cycle of pomalidomide on 02/06/2020.  We reviewed the CBC and basic metabolic panel from today.  Labs adequate to proceed.  Creatinine is mildly elevated.  We will continue to monitor.  He will return for lab, follow-up, daratumumab in 2 weeks.  We will obtain a myeloma panel at that time.  He will contact the office prior to his next visit with any problems.  Patient seen with Dr. Benay Spice.    [name] ANP/GNP-BC   02/03/2020  10:07 AM This was a shared visit with [name].  Mr. Koelzer appears stable.  He will continue daratumumab/pomalidomide/Decadron.  He will continue every 84-monthZometa.  The creatinine is mildly elevated today.  We will repeat this when he returns in 2 weeks.  [facility]

## 2020-02-04 ENCOUNTER — Telehealth: Payer: Self-pay | Admitting: Oncology

## 2020-02-04 NOTE — Telephone Encounter (Signed)
Scheduled per los. Called and spoke with patients wife. Confirmed appts 

## 2020-02-14 ENCOUNTER — Other Ambulatory Visit: Payer: Self-pay | Admitting: Oncology

## 2020-02-17 ENCOUNTER — Inpatient Hospital Stay: Payer: BC Managed Care – PPO

## 2020-02-17 ENCOUNTER — Other Ambulatory Visit: Payer: Self-pay

## 2020-02-17 ENCOUNTER — Inpatient Hospital Stay (HOSPITAL_BASED_OUTPATIENT_CLINIC_OR_DEPARTMENT_OTHER): Payer: BC Managed Care – PPO | Admitting: Oncology

## 2020-02-17 VITALS — BP 124/66 | HR 79 | Temp 98.5°F | Resp 18 | Ht 74.0 in | Wt 258.2 lb

## 2020-02-17 DIAGNOSIS — C9000 Multiple myeloma not having achieved remission: Secondary | ICD-10-CM

## 2020-02-17 LAB — CBC WITH DIFFERENTIAL (CANCER CENTER ONLY)
Abs Immature Granulocytes: 0.07 10*3/uL (ref 0.00–0.07)
Basophils Absolute: 0.1 10*3/uL (ref 0.0–0.1)
Basophils Relative: 1 %
Eosinophils Absolute: 0.1 10*3/uL (ref 0.0–0.5)
Eosinophils Relative: 2 %
HCT: 37.1 % — ABNORMAL LOW (ref 39.0–52.0)
Hemoglobin: 12.8 g/dL — ABNORMAL LOW (ref 13.0–17.0)
Immature Granulocytes: 1 %
Lymphocytes Relative: 9 %
Lymphs Abs: 0.7 10*3/uL (ref 0.7–4.0)
MCH: 32.9 pg (ref 26.0–34.0)
MCHC: 34.5 g/dL (ref 30.0–36.0)
MCV: 95.4 fL (ref 80.0–100.0)
Monocytes Absolute: 0.6 10*3/uL (ref 0.1–1.0)
Monocytes Relative: 8 %
Neutro Abs: 5.5 10*3/uL (ref 1.7–7.7)
Neutrophils Relative %: 79 %
Platelet Count: 176 10*3/uL (ref 150–400)
RBC: 3.89 MIL/uL — ABNORMAL LOW (ref 4.22–5.81)
RDW: 16 % — ABNORMAL HIGH (ref 11.5–15.5)
WBC Count: 7 10*3/uL (ref 4.0–10.5)
nRBC: 0 % (ref 0.0–0.2)

## 2020-02-17 LAB — CMP (CANCER CENTER ONLY)
ALT: 40 U/L (ref 0–44)
AST: 21 U/L (ref 15–41)
Albumin: 3.9 g/dL (ref 3.5–5.0)
Alkaline Phosphatase: 51 U/L (ref 38–126)
Anion gap: 15 (ref 5–15)
BUN: 18 mg/dL (ref 6–20)
CO2: 26 mmol/L (ref 22–32)
Calcium: 8.6 mg/dL — ABNORMAL LOW (ref 8.9–10.3)
Chloride: 101 mmol/L (ref 98–111)
Creatinine: 1.36 mg/dL — ABNORMAL HIGH (ref 0.61–1.24)
GFR, Est AFR Am: >60 mL/min (ref >60)
GFR, Estimated: 59 mL/min — ABNORMAL LOW (ref >60)
Glucose, Bld: 156 mg/dL — ABNORMAL HIGH (ref 70–99)
Potassium: 4.4 mmol/L (ref 3.5–5.1)
Sodium: 142 mmol/L (ref 135–145)
Total Bilirubin: 0.6 mg/dL (ref 0.3–1.2)
Total Protein: 6.2 g/dL — ABNORMAL LOW (ref 6.5–8.1)

## 2020-02-17 MED ORDER — DIPHENHYDRAMINE HCL 25 MG PO CAPS
50.0000 mg | ORAL_CAPSULE | Freq: Once | ORAL | Status: AC
  Administered 2020-02-17: 50 mg via ORAL

## 2020-02-17 MED ORDER — DIPHENHYDRAMINE HCL 25 MG PO CAPS
ORAL_CAPSULE | ORAL | Status: AC
  Filled 2020-02-17: qty 2

## 2020-02-17 MED ORDER — ACETAMINOPHEN 325 MG PO TABS
ORAL_TABLET | ORAL | Status: AC
  Filled 2020-02-17: qty 2

## 2020-02-17 MED ORDER — DEXAMETHASONE 4 MG PO TABS
ORAL_TABLET | ORAL | Status: AC
  Filled 2020-02-17: qty 5

## 2020-02-17 MED ORDER — DARATUMUMAB-HYALURONIDASE-FIHJ 1800-30000 MG-UT/15ML ~~LOC~~ SOLN
1800.0000 mg | Freq: Once | SUBCUTANEOUS | Status: AC
  Administered 2020-02-17: 1800 mg via SUBCUTANEOUS
  Filled 2020-02-17: qty 15

## 2020-02-17 MED ORDER — ACETAMINOPHEN 325 MG PO TABS
650.0000 mg | ORAL_TABLET | Freq: Once | ORAL | Status: AC
  Administered 2020-02-17: 650 mg via ORAL

## 2020-02-17 MED ORDER — DEXAMETHASONE 4 MG PO TABS
20.0000 mg | ORAL_TABLET | Freq: Once | ORAL | Status: AC
  Administered 2020-02-17: 20 mg via ORAL

## 2020-02-17 NOTE — Patient Instructions (Signed)
[facility] Discharge Instructions for Patients Receiving Chemotherapy  Today you received the following chemotherapy agents: Darzalex Faspro  To help prevent nausea and vomiting after your treatment, we encourage you to take your nausea medication as directed.    If you develop nausea and vomiting that is not controlled by your nausea medication, call the clinic.   BELOW ARE SYMPTOMS THAT SHOULD BE REPORTED IMMEDIATELY:  *FEVER GREATER THAN 100.5 F  *CHILLS WITH OR WITHOUT FEVER  NAUSEA AND VOMITING THAT IS NOT CONTROLLED WITH YOUR NAUSEA MEDICATION  *UNUSUAL SHORTNESS OF BREATH  *UNUSUAL BRUISING OR BLEEDING  TENDERNESS IN MOUTH AND THROAT WITH OR WITHOUT PRESENCE OF ULCERS  *URINARY PROBLEMS  *BOWEL PROBLEMS  UNUSUAL RASH Items with * indicate a potential emergency and should be followed up as soon as possible.  Feel free to call the clinic should you have any questions or concerns. The clinic phone number is [phone].  Please show the CHEMO ALERT CARD at check-in to the Emergency Department and triage nurse.   

## 2020-02-17 NOTE — Progress Notes (Signed)
[place] OFFICE PROGRESS NOTE   Diagnosis: Multiple myeloma  INTERVAL HISTORY:   Steve Carter returns as scheduled.  His wife is present by telephone for today's visit.  He use Revlimid and daratumumab.  No nausea, diarrhea, or pain.  He continues to have a tremor.  He reports several recent falls when getting up quickly from a sitting position.  He has undergone an extensive neurologic evaluation at [facility] that has been nondiagnostic.  An area of abnormal signal is noted in the left frontal region has been noted repeatedly.  There are also hyperintense white matter lesions.  He has been referred to neurosurgery.  He underwent an EEG which revealed no epileptiform discharges, but he was noted to have shaking with altered awareness when standing.   Objective:  Vital signs in last 24 hours:  Blood pressure 124/66, pulse 79, temperature 98.5 F (36.9 C), temperature source Temporal, resp. rate 18, height '6\' 2"'$  (1.88 m), weight 258 lb 3.2 oz (117.1 kg), SpO2 98 %.    HEENT: No thrush or ulcers Vascular: No leg edema Neuro: Alert, resting tremor that is worse with finger-to-nose testing Musculoskeletal: No low back tenderness    Lab Results:  Lab Results  Component Value Date   WBC 7.0 02/17/2020   HGB 12.8 (L) 02/17/2020   HCT 37.1 (L) 02/17/2020   MCV 95.4 02/17/2020   PLT 176 02/17/2020   NEUTROABS 5.5 02/17/2020    CMP  Lab Results  Component Value Date   NA 140 02/03/2020   K 3.8 02/03/2020   CL 101 02/03/2020   CO2 25 02/03/2020   GLUCOSE 216 (H) 02/03/2020   BUN 20 02/03/2020   CREATININE 1.44 (H) 02/03/2020   CALCIUM 7.8 (L) 02/03/2020   PROT 6.7 01/12/2020   ALBUMIN 4.1 01/12/2020   AST 16 01/12/2020   ALT 39 01/12/2020   ALKPHOS 57 01/12/2020   BILITOT 0.4 01/12/2020   GFRNONAA 55 (L) 02/03/2020   GFRAA >60 02/03/2020    Medications: I have reviewed the patient's current medications.   Assessment/Plan: Multiple myeloma-IgG  lambda serum monoclonal protein, elevated serum lambda light chains  Bone survey 10/11/2017-lytic lesions noted in the skull, right iliac, left second rib, and mottled appearance of the proximal femurs/pelvis  Bone marrow biopsy 10/14/2017-hypercellular marrow with plasma cell neoplasm, 82% plasma cells, lambda light chain restricted, hyperdiploid with gains of chromosomes 3, 5, 7, 9, and 11. FISH panel positive for gain of ATM(+11)  Cycle 1 RVD 10/18/2017 (Revlimid started 10/23/2017)  Cycle 2 RVD 11/15/2017  Cycle 3 RVD 12/13/2017  Cycle 4 RVD 01/14/2018  Cycle 5 of RVD 02/14/2018 (Revlimid given for 7 days and 1 week of Velcade), therapy held beginning 02/21/2018 secondary to plan for stem cell therapy  Bone marrow biopsy 02/20/2018, 1-2% plasma cell  PET scan 02/20/2018, no malignant range activity above background, numerous lytic lesions throughout the axial and appendicular skeleton,, left iliac wing fracture  Melphalan, 200 mg/m on 03/24/2018  Stem cell infusion 03/25/2018  Restaging at [facility] 07/09/2018: Normal lambda light chains, no serum M spike, bone marrow biopsy negative for myeloma, less than 1% plasma cells  PET scan at [facility] 07/09/2018-lytic bone lesions, no malignant range activity  Initiation of maintenance Revlimid, 10 mg, 21/28 days 08/15/2018  Cycle 2 maintenance Revlimid 09/12/2018  Cycle 3 maintenance Revlimid 10/10/2018  Revlimid placed on hold 06/15/2019 due to presyncopal/syncopal episodes and diarrhea  Revlimid resumed 5 mg 21 days on/7 days off following office  visit 06/29/2019  Revlimidplaced on hold 07/14/2019  Revlimid resumed 08/21/2019  Bone survey 09/09/2019-no acute findings or clear explanation for right buttock pain. Lucent lesions in the right pelvis are stable without pathologic fracture. Evidence of healing lytic lesions in the right scapula, cervical spine spinous processes and left L4 transverse process. Possible mild progression of  lytic lesions within the L1 and L4 vertebral bodies. Stable lytic lesions in the calvarium.  PET scan 09/25/2019-new FDG avid bone lesions in the right humeral neck and sacrum similar remaining lytic lesions with FDG activity below background  Bone marrow biopsy at [facility] on 10/01/2019-5% plasma cells on the bone marrow biopsy suboptimal sample  Cycle 1 daratumumab, pomalidomide, Decadron 10/19/2019  Zometa 10/19/2019  Cycle 2 daratumumab, pomalidomide, Decadron 11/16/2019 (he will begin the pomalidomide 11/17/2019)  Cycle 3 daratumumab, pomalidomide, and Decadron 12/14/2019  Cycle 4 daratumumab, pomalidomide, and Decadron 01/11/2020  Cycle 5 daratumumab, pomalidomide, Decadron 02/03/2020 (he began pomalidomide on 02/06/2020)   2.Pain secondary to multiple myeloma involving the spine and pelvis-resolved  MRI of the lumbar spine 10/02/2018-numerous rounded foci in the vertebral bodies, hypertrophy of the L4 transverse process  3.Hypertension-losartan dose increased 11/15/2017  4.Depression-improved with Wellbutrin beginning September 2020  5.Altered mental status-improved depression related? 6.Diabetes 7.  Recurrent episodes of fall/syncope-etiology unclear-evaluated by neurology at [facility], MRI brain 01/25/2020 with abnormal signal at the left superior frontal gyrus-potentially representing a low-grade glioma, scattered foci of increased T2 white matter signal-likely related to chronic small vessel disease    Disposition: Steve Carter appears unchanged.  He is completing cycle 5 of daratumumab, pomalidomide, and Decadron.  He is now maintained on every 2-week daratumumab.  He will complete another treatment today.  He will continue the current cycle of pomalidomide.  Steve Carter will return for an office visit in daratumumab in 2 weeks.  We will follow-up on the myeloma panel from today.  He continues evaluation at [facility] for the recurrent falls and  tremors.  Steve Carter has received the COVID-19 vaccine.  He will continue every 3 months Zometa.  Betsy Coder, MD  02/17/2020  12:37 PM

## 2020-02-18 LAB — KAPPA/LAMBDA LIGHT CHAINS
Kappa free light chain: 4.6 mg/L (ref 3.3–19.4)
Kappa, lambda light chain ratio: 0.53 (ref 0.26–1.65)
Lambda free light chains: 8.7 mg/L (ref 5.7–26.3)

## 2020-02-19 ENCOUNTER — Telehealth: Payer: Self-pay | Admitting: Oncology

## 2020-02-19 LAB — PROTEIN ELECTROPHORESIS, SERUM
A/G Ratio: 1.5 (ref 0.7–1.7)
Albumin ELP: 3.5 g/dL (ref 2.9–4.4)
Alpha-1-Globulin: 0.2 g/dL (ref 0.0–0.4)
Alpha-2-Globulin: 0.9 g/dL (ref 0.4–1.0)
Beta Globulin: 0.9 g/dL (ref 0.7–1.3)
Gamma Globulin: 0.3 g/dL — ABNORMAL LOW (ref 0.4–1.8)
Globulin, Total: 2.3 g/dL (ref 2.2–3.9)
M-Spike, %: 0.1 g/dL — ABNORMAL HIGH
Total Protein ELP: 5.8 g/dL — ABNORMAL LOW (ref 6.0–8.5)

## 2020-02-19 NOTE — Telephone Encounter (Signed)
Scheduled per los. Called and spoke with patients wife. Confirmed appt  

## 2020-02-22 ENCOUNTER — Other Ambulatory Visit: Payer: Self-pay | Admitting: Oncology

## 2020-02-22 DIAGNOSIS — C9000 Multiple myeloma not having achieved remission: Secondary | ICD-10-CM

## 2020-02-25 NOTE — Progress Notes (Signed)
Pharmacist Chemotherapy Monitoring - Follow Up Assessment    I verify that I have reviewed each item in the below checklist:  . Regimen for the patient is scheduled for the appropriate day and plan matches scheduled date. Marland Kitchen Appropriate non-routine labs are ordered dependent on drug ordered. . If applicable, additional medications reviewed and ordered per protocol based on lifetime cumulative doses and/or treatment regimen.   Plan for follow-up and/or issues identified: No . I-vent associated with next due treatment: No . MD and/or nursing notified: No  Tatyanna Cronk K 02/25/2020 1:13 PM

## 2020-02-28 ENCOUNTER — Other Ambulatory Visit: Payer: Self-pay | Admitting: Oncology

## 2020-03-02 ENCOUNTER — Inpatient Hospital Stay: Payer: BC Managed Care – PPO

## 2020-03-02 ENCOUNTER — Other Ambulatory Visit: Payer: Self-pay

## 2020-03-02 ENCOUNTER — Inpatient Hospital Stay (HOSPITAL_BASED_OUTPATIENT_CLINIC_OR_DEPARTMENT_OTHER): Payer: BC Managed Care – PPO | Admitting: Oncology

## 2020-03-02 VITALS — BP 107/76 | HR 75 | Temp 97.8°F | Resp 17 | Ht 74.0 in | Wt 260.1 lb

## 2020-03-02 DIAGNOSIS — C9000 Multiple myeloma not having achieved remission: Secondary | ICD-10-CM

## 2020-03-02 LAB — CMP (CANCER CENTER ONLY)
ALT: 53 U/L — ABNORMAL HIGH (ref 0–44)
AST: 23 U/L (ref 15–41)
Albumin: 3.7 g/dL (ref 3.5–5.0)
Alkaline Phosphatase: 50 U/L (ref 38–126)
Anion gap: 15 (ref 5–15)
BUN: 18 mg/dL (ref 6–20)
CO2: 19 mmol/L — ABNORMAL LOW (ref 22–32)
Calcium: 8.4 mg/dL — ABNORMAL LOW (ref 8.9–10.3)
Chloride: 103 mmol/L (ref 98–111)
Creatinine: 1.46 mg/dL — ABNORMAL HIGH (ref 0.61–1.24)
GFR, Est AFR Am: >60 mL/min (ref >60)
GFR, Estimated: 54 mL/min — ABNORMAL LOW (ref >60)
Glucose, Bld: 260 mg/dL — ABNORMAL HIGH (ref 70–99)
Potassium: 4.2 mmol/L (ref 3.5–5.1)
Sodium: 137 mmol/L (ref 135–145)
Total Bilirubin: 0.5 mg/dL (ref 0.3–1.2)
Total Protein: 6.1 g/dL — ABNORMAL LOW (ref 6.5–8.1)

## 2020-03-02 LAB — CBC WITH DIFFERENTIAL (CANCER CENTER ONLY)
Abs Immature Granulocytes: 0.04 10*3/uL (ref 0.00–0.07)
Basophils Absolute: 0.1 10*3/uL (ref 0.0–0.1)
Basophils Relative: 2 %
Eosinophils Absolute: 0.2 10*3/uL (ref 0.0–0.5)
Eosinophils Relative: 4 %
HCT: 35.6 % — ABNORMAL LOW (ref 39.0–52.0)
Hemoglobin: 12.5 g/dL — ABNORMAL LOW (ref 13.0–17.0)
Immature Granulocytes: 1 %
Lymphocytes Relative: 15 %
Lymphs Abs: 0.6 10*3/uL — ABNORMAL LOW (ref 0.7–4.0)
MCH: 33.8 pg (ref 26.0–34.0)
MCHC: 35.1 g/dL (ref 30.0–36.0)
MCV: 96.2 fL (ref 80.0–100.0)
Monocytes Absolute: 0.7 10*3/uL (ref 0.1–1.0)
Monocytes Relative: 19 %
Neutro Abs: 2.3 10*3/uL (ref 1.7–7.7)
Neutrophils Relative %: 59 %
Platelet Count: 143 10*3/uL — ABNORMAL LOW (ref 150–400)
RBC: 3.7 MIL/uL — ABNORMAL LOW (ref 4.22–5.81)
RDW: 15.7 % — ABNORMAL HIGH (ref 11.5–15.5)
WBC Count: 3.9 10*3/uL — ABNORMAL LOW (ref 4.0–10.5)
nRBC: 0 % (ref 0.0–0.2)

## 2020-03-02 MED ORDER — DIPHENHYDRAMINE HCL 25 MG PO CAPS
50.0000 mg | ORAL_CAPSULE | Freq: Once | ORAL | Status: AC
  Administered 2020-03-02: 50 mg via ORAL

## 2020-03-02 MED ORDER — DEXAMETHASONE 4 MG PO TABS
ORAL_TABLET | ORAL | Status: AC
  Filled 2020-03-02: qty 5

## 2020-03-02 MED ORDER — DARATUMUMAB-HYALURONIDASE-FIHJ 1800-30000 MG-UT/15ML ~~LOC~~ SOLN
1800.0000 mg | Freq: Once | SUBCUTANEOUS | Status: AC
  Administered 2020-03-02: 1800 mg via SUBCUTANEOUS
  Filled 2020-03-02: qty 15

## 2020-03-02 MED ORDER — ACETAMINOPHEN 325 MG PO TABS
650.0000 mg | ORAL_TABLET | Freq: Once | ORAL | Status: AC
  Administered 2020-03-02: 650 mg via ORAL

## 2020-03-02 MED ORDER — DEXAMETHASONE 4 MG PO TABS
20.0000 mg | ORAL_TABLET | Freq: Once | ORAL | Status: AC
  Administered 2020-03-02: 20 mg via ORAL

## 2020-03-02 MED ORDER — DIPHENHYDRAMINE HCL 25 MG PO CAPS
ORAL_CAPSULE | ORAL | Status: AC
  Filled 2020-03-02: qty 2

## 2020-03-02 MED ORDER — ACETAMINOPHEN 325 MG PO TABS
ORAL_TABLET | ORAL | Status: AC
  Filled 2020-03-02: qty 2

## 2020-03-02 NOTE — Progress Notes (Signed)
[place] OFFICE PROGRESS NOTE   Diagnosis: Multiple myeloma  INTERVAL HISTORY:   Steve Carter returns for a scheduled visit.  He continues every 2-week daratumumab.  He is scheduled to begin another cycle of pomalidomide on 03/05/2020.  No pain.  He continues to have falls when standing up quickly, last on 02/19/2020.  He is scheduled for a consult and neurosurgery at [facility] later today. He reports his blood sugar is under good control. Objective:  Vital signs in last 24 hours:  Blood pressure 107/76, pulse 75, temperature 97.8 F (36.6 C), temperature source Temporal, resp. rate 17, height '6\' 2"'$  (1.88 m), weight 260 lb 1.6 oz (118 kg), SpO2 98 %.    HEENT: No thrush or ulcers Cardio: Regular rate and rhythm GI: No hepatosplenomegaly, nontender Vascular: No leg edema   Lab Results:  Lab Results  Component Value Date   WBC 3.9 (L) 03/02/2020   HGB 12.5 (L) 03/02/2020   HCT 35.6 (L) 03/02/2020   MCV 96.2 03/02/2020   PLT 143 (L) 03/02/2020   NEUTROABS 2.3 03/02/2020    CMP  Lab Results  Component Value Date   NA 137 03/02/2020   K 4.2 03/02/2020   CL 103 03/02/2020   CO2 19 (L) 03/02/2020   GLUCOSE 260 (H) 03/02/2020   BUN 18 03/02/2020   CREATININE 1.46 (H) 03/02/2020   CALCIUM 8.4 (L) 03/02/2020   PROT 6.1 (L) 03/02/2020   ALBUMIN 3.7 03/02/2020   AST 23 03/02/2020   ALT 53 (H) 03/02/2020   ALKPHOS 50 03/02/2020   BILITOT 0.5 03/02/2020   GFRNONAA 54 (L) 03/02/2020   GFRAA >60 03/02/2020     Medications: I have reviewed the patient's current medications.   Assessment/Plan: 1. Multiple myeloma-IgG lambda serum monoclonal protein, elevated serum lambda light chains  Bone survey 10/11/2017-lytic lesions noted in the skull, right iliac, left second rib, and mottled appearance of the proximal femurs/pelvis  Bone marrow biopsy 10/14/2017-hypercellular marrow with plasma cell neoplasm, 82% plasma cells, lambda light chain restricted,  hyperdiploid with gains of chromosomes 3, 5, 7, 9, and 11. FISH panel positive for gain of ATM(+11)  Cycle 1 RVD 10/18/2017 (Revlimid started 10/23/2017)  Cycle 2 RVD 11/15/2017  Cycle 3 RVD 12/13/2017  Cycle 4 RVD 01/14/2018  Cycle 5 of RVD 02/14/2018 (Revlimid given for 7 days and 1 week of Velcade), therapy held beginning 02/21/2018 secondary to plan for stem cell therapy  Bone marrow biopsy 02/20/2018, 1-2% plasma cell  PET scan 02/20/2018, no malignant range activity above background, numerous lytic lesions throughout the axial and appendicular skeleton,, left iliac wing fracture  Melphalan, 200 mg/m on 03/24/2018  Stem cell infusion 03/25/2018  Restaging at [facility] 07/09/2018: Normal lambda light chains, no serum M spike, bone marrow biopsy negative for myeloma, less than 1% plasma cells  PET scan at [facility] 07/09/2018-lytic bone lesions, no malignant range activity  Initiation of maintenance Revlimid, 10 mg, 21/28 days 08/15/2018  Cycle 2 maintenance Revlimid 09/12/2018  Cycle 3 maintenance Revlimid 10/10/2018  Revlimid placed on hold 06/15/2019 due to presyncopal/syncopal episodes and diarrhea  Revlimid resumed 5 mg 21 days on/7 days off following office visit 06/29/2019  Revlimidplaced on hold 07/14/2019  Revlimid resumed 08/21/2019  Bone survey 09/09/2019-no acute findings or clear explanation for right buttock pain. Lucent lesions in the right pelvis are stable without pathologic fracture. Evidence of healing lytic lesions in the right scapula, cervical spine spinous processes and left L4 transverse process. Possible mild  progression of lytic lesions within the L1 and L4 vertebral bodies. Stable lytic lesions in the calvarium.  PET scan 09/25/2019-new FDG avid bone lesions in the right humeral neck and sacrum similar remaining lytic lesions with FDG activity below background  Bone marrow biopsy at [facility] on 10/01/2019-5% plasma cells on the bone marrow biopsy  suboptimal sample  Cycle 1 daratumumab, pomalidomide, Decadron 10/19/2019  Zometa 10/19/2019  Cycle 2 daratumumab, pomalidomide, Decadron 11/16/2019 (he will begin the pomalidomide 11/17/2019)  Cycle 3 daratumumab, pomalidomide, and Decadron 12/14/2019  Cycle 4 daratumumab, pomalidomide, and Decadron 01/11/2020  Cycle 5 daratumumab, pomalidomide, Decadron 02/03/2020 (he began pomalidomide on 02/06/2020)  Cycle 6 daratumumab, pomalidomide, Decadron 03/02/2020 (pomalidomide starting 03/05/2020)   2.Pain secondary to multiple myeloma involving the spine and pelvis-resolved  MRI of the lumbar spine 10/02/2018-numerous rounded foci in the vertebral bodies, hypertrophy of the L4 transverse process  3.Hypertension-losartan dose increased 11/15/2017  4.Depression-improved with Wellbutrin beginning September 2020  5.Altered mental status-improved depression related? 6.Diabetes 7.  Recurrent episodes of fall/syncope-etiology unclear-evaluated by neurology at [facility], MRI brain 01/25/2020 with abnormal signal at the left superior frontal gyrus-potentially representing a low-grade glioma, scattered foci of increased T2 white matter signal-likely related to chronic small vessel disease     Disposition: Steve Carter appears stable.  He is tolerating the daratumumab and pomalidomide well.  He will begin another cycle this week.  He continues to have symptoms of orthostasis.  I recommended he follow-up with his cardiology to consider adjusting the antihypertensive regimen.  His blood pressure has been low here for the past several months.  I recommended he schedule an appointment with his cardiologist this week.  Steve Carter will return for an office visit in daratumumab in 2 weeks.  We will check a myeloma panel when he is here in 2 weeks.  He will continue every 1-monthZometa. GBetsy Coder MD  03/02/2020  9:49 AM

## 2020-03-02 NOTE — Patient Instructions (Signed)
[facility] Discharge Instructions for Patients Receiving Chemotherapy  Today you received the following chemotherapy agents: Darzalex Faspro  To help prevent nausea and vomiting after your treatment, we encourage you to take your nausea medication as directed.    If you develop nausea and vomiting that is not controlled by your nausea medication, call the clinic.   BELOW ARE SYMPTOMS THAT SHOULD BE REPORTED IMMEDIATELY:  *FEVER GREATER THAN 100.5 F  *CHILLS WITH OR WITHOUT FEVER  NAUSEA AND VOMITING THAT IS NOT CONTROLLED WITH YOUR NAUSEA MEDICATION  *UNUSUAL SHORTNESS OF BREATH  *UNUSUAL BRUISING OR BLEEDING  TENDERNESS IN MOUTH AND THROAT WITH OR WITHOUT PRESENCE OF ULCERS  *URINARY PROBLEMS  *BOWEL PROBLEMS  UNUSUAL RASH Items with * indicate a potential emergency and should be followed up as soon as possible.  Feel free to call the clinic should you have any questions or concerns. The clinic phone number is [phone].  Please show the CHEMO ALERT CARD at check-in to the Emergency Department and triage nurse.   

## 2020-03-03 ENCOUNTER — Telehealth: Payer: Self-pay | Admitting: Oncology

## 2020-03-03 ENCOUNTER — Telehealth: Payer: Self-pay | Admitting: *Deleted

## 2020-03-03 ENCOUNTER — Encounter: Payer: Self-pay | Admitting: Oncology

## 2020-03-03 NOTE — Telephone Encounter (Signed)
Scheduled per los. Called and left msg. Mailed printout  °

## 2020-03-03 NOTE — Telephone Encounter (Signed)
Wife notified office that pharmacy needs the authorization # for the Pomalidomide refilled on 02/23/20. Obtained authorization [id number] dated 03/03/20 and called to pharmacy. Claim processed and they will call patient.

## 2020-03-08 ENCOUNTER — Telehealth: Payer: Self-pay | Admitting: Oncology

## 2020-03-08 ENCOUNTER — Encounter: Payer: Self-pay | Admitting: Oncology

## 2020-03-08 NOTE — Telephone Encounter (Signed)
R/s appt per 4/6 sch message - pt wife aware of appt

## 2020-03-10 ENCOUNTER — Encounter: Payer: Self-pay | Admitting: Oncology

## 2020-03-13 DIAGNOSIS — G939 Disorder of brain, unspecified: Secondary | ICD-10-CM | POA: Insufficient documentation

## 2020-03-15 ENCOUNTER — Ambulatory Visit: Payer: Self-pay | Admitting: Oncology

## 2020-03-15 ENCOUNTER — Inpatient Hospital Stay: Payer: BC Managed Care – PPO

## 2020-03-15 ENCOUNTER — Other Ambulatory Visit: Payer: Self-pay

## 2020-03-16 ENCOUNTER — Encounter: Payer: Self-pay | Admitting: *Deleted

## 2020-03-16 NOTE — Progress Notes (Signed)
Received faxed forms for FMLA for wife, Olivia Mackie for intermittent leave 03/14/20-03/14/21. Forwarded to [place], Therapist, sports.

## 2020-03-16 NOTE — Progress Notes (Signed)
Pharmacist Chemotherapy Monitoring - Follow Up Assessment    I verify that I have reviewed each item in the below checklist:  . Regimen for the patient is scheduled for the appropriate day and plan matches scheduled date. Marland Kitchen Appropriate non-routine labs are ordered dependent on drug ordered. . If applicable, additional medications reviewed and ordered per protocol based on lifetime cumulative doses and/or treatment regimen.   Plan for follow-up and/or issues identified: No . I-vent associated with next due treatment: Yes . MD and/or nursing notified: No  Adelina Mings 03/16/2020 9:16 AM

## 2020-03-21 ENCOUNTER — Encounter: Payer: Self-pay | Admitting: Oncology

## 2020-03-22 ENCOUNTER — Inpatient Hospital Stay: Payer: BC Managed Care – PPO

## 2020-03-22 ENCOUNTER — Inpatient Hospital Stay: Payer: BC Managed Care – PPO | Admitting: Oncology

## 2020-03-22 DIAGNOSIS — G253 Myoclonus: Secondary | ICD-10-CM | POA: Insufficient documentation

## 2020-03-23 NOTE — Progress Notes (Signed)
Pharmacist Chemotherapy Monitoring - Follow Up Assessment    I verify that I have reviewed each item in the below checklist:  . Regimen for the patient is scheduled for the appropriate day and plan matches scheduled date. Marland Kitchen Appropriate non-routine labs are ordered dependent on drug ordered. . If applicable, additional medications reviewed and ordered per protocol based on lifetime cumulative doses and/or treatment regimen.   Plan for follow-up and/or issues identified: No . I-vent associated with next due treatment: No . MD and/or nursing notified: No  Steve Carter [facility] 03/23/2020 2:31 PM

## 2020-03-29 ENCOUNTER — Inpatient Hospital Stay: Payer: BC Managed Care – PPO

## 2020-03-29 ENCOUNTER — Other Ambulatory Visit: Payer: Self-pay

## 2020-03-29 ENCOUNTER — Inpatient Hospital Stay: Payer: BC Managed Care – PPO | Attending: Nurse Practitioner | Admitting: Oncology

## 2020-03-29 VITALS — BP 128/81 | HR 67 | Temp 98.2°F | Resp 18 | Ht 74.0 in | Wt 251.8 lb

## 2020-03-29 DIAGNOSIS — C9000 Multiple myeloma not having achieved remission: Secondary | ICD-10-CM | POA: Diagnosis not present

## 2020-03-29 DIAGNOSIS — Z5112 Encounter for antineoplastic immunotherapy: Secondary | ICD-10-CM | POA: Diagnosis present

## 2020-03-29 DIAGNOSIS — I10 Essential (primary) hypertension: Secondary | ICD-10-CM | POA: Insufficient documentation

## 2020-03-29 DIAGNOSIS — G893 Neoplasm related pain (acute) (chronic): Secondary | ICD-10-CM | POA: Diagnosis not present

## 2020-03-29 DIAGNOSIS — F329 Major depressive disorder, single episode, unspecified: Secondary | ICD-10-CM | POA: Insufficient documentation

## 2020-03-29 DIAGNOSIS — Z79899 Other long term (current) drug therapy: Secondary | ICD-10-CM | POA: Insufficient documentation

## 2020-03-29 DIAGNOSIS — E119 Type 2 diabetes mellitus without complications: Secondary | ICD-10-CM | POA: Insufficient documentation

## 2020-03-29 DIAGNOSIS — R251 Tremor, unspecified: Secondary | ICD-10-CM | POA: Diagnosis not present

## 2020-03-29 DIAGNOSIS — R4182 Altered mental status, unspecified: Secondary | ICD-10-CM | POA: Insufficient documentation

## 2020-03-29 LAB — CBC WITH DIFFERENTIAL (CANCER CENTER ONLY)
Abs Immature Granulocytes: 0.05 10*3/uL (ref 0.00–0.07)
Basophils Absolute: 0 10*3/uL (ref 0.0–0.1)
Basophils Relative: 0 %
Eosinophils Absolute: 0.3 10*3/uL (ref 0.0–0.5)
Eosinophils Relative: 4 %
HCT: 34.4 % — ABNORMAL LOW (ref 39.0–52.0)
Hemoglobin: 12.2 g/dL — ABNORMAL LOW (ref 13.0–17.0)
Immature Granulocytes: 1 %
Lymphocytes Relative: 13 %
Lymphs Abs: 0.9 10*3/uL (ref 0.7–4.0)
MCH: 34.7 pg — ABNORMAL HIGH (ref 26.0–34.0)
MCHC: 35.5 g/dL (ref 30.0–36.0)
MCV: 97.7 fL (ref 80.0–100.0)
Monocytes Absolute: 1.1 10*3/uL — ABNORMAL HIGH (ref 0.1–1.0)
Monocytes Relative: 16 %
Neutro Abs: 4.8 10*3/uL (ref 1.7–7.7)
Neutrophils Relative %: 66 %
Platelet Count: 159 10*3/uL (ref 150–400)
RBC: 3.52 MIL/uL — ABNORMAL LOW (ref 4.22–5.81)
RDW: 13.8 % (ref 11.5–15.5)
WBC Count: 7.1 10*3/uL (ref 4.0–10.5)
nRBC: 0 % (ref 0.0–0.2)

## 2020-03-29 LAB — CMP (CANCER CENTER ONLY)
ALT: 43 U/L (ref 0–44)
AST: 20 U/L (ref 15–41)
Albumin: 3.9 g/dL (ref 3.5–5.0)
Alkaline Phosphatase: 53 U/L (ref 38–126)
Anion gap: 13 (ref 5–15)
BUN: 21 mg/dL — ABNORMAL HIGH (ref 6–20)
CO2: 22 mmol/L (ref 22–32)
Calcium: 8.3 mg/dL — ABNORMAL LOW (ref 8.9–10.3)
Chloride: 101 mmol/L (ref 98–111)
Creatinine: 1.38 mg/dL — ABNORMAL HIGH (ref 0.61–1.24)
GFR, Est AFR Am: >60 mL/min (ref >60)
GFR, Estimated: 58 mL/min — ABNORMAL LOW (ref >60)
Glucose, Bld: 163 mg/dL — ABNORMAL HIGH (ref 70–99)
Potassium: 4.1 mmol/L (ref 3.5–5.1)
Sodium: 136 mmol/L (ref 135–145)
Total Bilirubin: 0.4 mg/dL (ref 0.3–1.2)
Total Protein: 6.3 g/dL — ABNORMAL LOW (ref 6.5–8.1)

## 2020-03-29 MED ORDER — ACETAMINOPHEN 325 MG PO TABS
ORAL_TABLET | ORAL | Status: AC
  Filled 2020-03-29: qty 2

## 2020-03-29 MED ORDER — ACETAMINOPHEN 325 MG PO TABS
650.0000 mg | ORAL_TABLET | Freq: Once | ORAL | Status: AC
  Administered 2020-03-29: 650 mg via ORAL

## 2020-03-29 MED ORDER — DARATUMUMAB-HYALURONIDASE-FIHJ 1800-30000 MG-UT/15ML ~~LOC~~ SOLN
1800.0000 mg | Freq: Once | SUBCUTANEOUS | Status: AC
  Administered 2020-03-29: 1800 mg via SUBCUTANEOUS
  Filled 2020-03-29: qty 15

## 2020-03-29 MED ORDER — DEXAMETHASONE 4 MG PO TABS
20.0000 mg | ORAL_TABLET | Freq: Once | ORAL | Status: AC
  Administered 2020-03-29: 20 mg via ORAL

## 2020-03-29 MED ORDER — DIPHENHYDRAMINE HCL 25 MG PO CAPS
50.0000 mg | ORAL_CAPSULE | Freq: Once | ORAL | Status: AC
  Administered 2020-03-29: 50 mg via ORAL

## 2020-03-29 MED ORDER — DEXAMETHASONE 4 MG PO TABS
ORAL_TABLET | ORAL | Status: AC
  Filled 2020-03-29: qty 5

## 2020-03-29 MED ORDER — DIPHENHYDRAMINE HCL 25 MG PO CAPS
ORAL_CAPSULE | ORAL | Status: AC
  Filled 2020-03-29: qty 2

## 2020-03-29 NOTE — Patient Instructions (Signed)
Please provide a copy of your medical advanced directive to have scanned into your record.

## 2020-03-29 NOTE — Patient Instructions (Signed)
[facility] Discharge Instructions for Patients Receiving Chemotherapy  Today you received the following chemotherapy agents: Darzalex Faspro  To help prevent nausea and vomiting after your treatment, we encourage you to take your nausea medication as directed.    If you develop nausea and vomiting that is not controlled by your nausea medication, call the clinic.   BELOW ARE SYMPTOMS THAT SHOULD BE REPORTED IMMEDIATELY:  *FEVER GREATER THAN 100.5 F  *CHILLS WITH OR WITHOUT FEVER  NAUSEA AND VOMITING THAT IS NOT CONTROLLED WITH YOUR NAUSEA MEDICATION  *UNUSUAL SHORTNESS OF BREATH  *UNUSUAL BRUISING OR BLEEDING  TENDERNESS IN MOUTH AND THROAT WITH OR WITHOUT PRESENCE OF ULCERS  *URINARY PROBLEMS  *BOWEL PROBLEMS  UNUSUAL RASH Items with * indicate a potential emergency and should be followed up as soon as possible.  Feel free to call the clinic should you have any questions or concerns. The clinic phone number is [phone].  Please show the CHEMO ALERT CARD at check-in to the Emergency Department and triage nurse.   

## 2020-03-29 NOTE — Progress Notes (Signed)
[place] OFFICE PROGRESS NOTE   Diagnosis: Multiple myeloma  INTERVAL HISTORY:   Mr. Angert returns for a scheduled visit.  He is here with his wife.  He began the most recent cycle of pomalidomide on 03/12/2020.  He reports improvement in his tremor since starting primidone.  Mr. Guia is not checking his blood sugar regularly.  He has followed a better diet.  He underwent biopsy of a suspected brain lesion on 03/14/2020-the pathology revealed no neoplasm.  The biopsy was consistent with gliosis.  Mr. Harner has been started on vitamin B12 replacement.  Objective:  Vital signs in last 24 hours:  Blood pressure 128/81, pulse 67, temperature 98.2 F (36.8 C), temperature source Temporal, resp. rate 18, height '6\' 2"'$  (1.88 m), weight 251 lb 12.8 oz (114.2 kg), SpO2 98 %.    HEENT: No thrush or ulcers Resp: Lungs clear bilaterally Cardio: Regular rate and rhythm GI: No hepatosplenomegaly, nontender Vascular: No leg edema Neuro: Alert, ambulates without difficuly  Lab Results:  Lab Results  Component Value Date   WBC 7.1 03/29/2020   HGB 12.2 (L) 03/29/2020   HCT 34.4 (L) 03/29/2020   MCV 97.7 03/29/2020   PLT 159 03/29/2020   NEUTROABS 4.8 03/29/2020    CMP  Lab Results  Component Value Date   NA 136 03/29/2020   K 4.1 03/29/2020   CL 101 03/29/2020   CO2 22 03/29/2020   GLUCOSE 163 (H) 03/29/2020   BUN 21 (H) 03/29/2020   CREATININE 1.38 (H) 03/29/2020   CALCIUM 8.3 (L) 03/29/2020   PROT 6.3 (L) 03/29/2020   ALBUMIN 3.9 03/29/2020   AST 20 03/29/2020   ALT 43 03/29/2020   ALKPHOS 53 03/29/2020   BILITOT 0.4 03/29/2020   GFRNONAA 58 (L) 03/29/2020   GFRAA >60 03/29/2020     Medications: I have reviewed the patient's current medications.   Assessment/Plan: Multiple myeloma-IgG lambda serum monoclonal protein, elevated serum lambda light chains  Bone survey 10/11/2017-lytic lesions noted in the skull, right iliac, left second  rib, and mottled appearance of the proximal femurs/pelvis  Bone marrow biopsy 10/14/2017-hypercellular marrow with plasma cell neoplasm, 82% plasma cells, lambda light chain restricted, hyperdiploid with gains of chromosomes 3, 5, 7, 9, and 11. FISH panel positive for gain of ATM(+11)  Cycle 1 RVD 10/18/2017 (Revlimid started 10/23/2017)  Cycle 2 RVD 11/15/2017  Cycle 3 RVD 12/13/2017  Cycle 4 RVD 01/14/2018  Cycle 5 of RVD 02/14/2018 (Revlimid given for 7 days and 1 week of Velcade), therapy held beginning 02/21/2018 secondary to plan for stem cell therapy  Bone marrow biopsy 02/20/2018, 1-2% plasma cell  PET scan 02/20/2018, no malignant range activity above background, numerous lytic lesions throughout the axial and appendicular skeleton,, left iliac wing fracture  Melphalan, 200 mg/m on 03/24/2018  Stem cell infusion 03/25/2018  Restaging at [facility] 07/09/2018: Normal lambda light chains, no serum M spike, bone marrow biopsy negative for myeloma, less than 1% plasma cells  PET scan at [facility] 07/09/2018-lytic bone lesions, no malignant range activity  Initiation of maintenance Revlimid, 10 mg, 21/28 days 08/15/2018  Cycle 2 maintenance Revlimid 09/12/2018  Cycle 3 maintenance Revlimid 10/10/2018  Revlimid placed on hold 06/15/2019 due to presyncopal/syncopal episodes and diarrhea  Revlimid resumed 5 mg 21 days on/7 days off following office visit 06/29/2019  Revlimidplaced on hold 07/14/2019  Revlimid resumed 08/21/2019  Bone survey 09/09/2019-no acute findings or clear explanation for right buttock pain. Lucent lesions in the right pelvis  are stable without pathologic fracture. Evidence of healing lytic lesions in the right scapula, cervical spine spinous processes and left L4 transverse process. Possible mild progression of lytic lesions within the L1 and L4 vertebral bodies. Stable lytic lesions in the calvarium.  PET scan 09/25/2019-new FDG avid bone lesions in the  right humeral neck and sacrum similar remaining lytic lesions with FDG activity below background  Bone marrow biopsy at [facility] on 10/01/2019-5% plasma cells on the bone marrow biopsy suboptimal sample  Cycle 1 daratumumab, pomalidomide, Decadron 10/19/2019  Zometa 10/19/2019  Cycle 2 daratumumab, pomalidomide, Decadron 11/16/2019 (he will begin the pomalidomide 11/17/2019)  Cycle 3 daratumumab, pomalidomide, and Decadron 12/14/2019  Cycle 4 daratumumab, pomalidomide, and Decadron 01/11/2020  Cycle 5 daratumumab, pomalidomide, Decadron 02/03/2020 (he began pomalidomide on 02/06/2020)  Cycle 6 daratumumab, pomalidomide, Decadron 03/02/2020 (pomalidomide starting 03/12/2020)  Cycle 7 daratumumab, pomalidomide, Decadron (pomalidomide scheduled to start 04/09/2020)   2.Pain secondary to multiple myeloma involving the spine and pelvis-resolved  MRI of the lumbar spine 10/02/2018-numerous rounded foci in the vertebral bodies, hypertrophy of the L4 transverse process  3.Hypertension-losartan dose increased 11/15/2017  4.Depression-improved with Wellbutrin beginning September 2020  5.Altered mental status-improved depression related? 6.Diabetes 7.  Recurrent episodes of fall/syncope-etiology unclear-evaluated by neurology at [facility], MRI brain 01/25/2020 with abnormal signal at the left superior frontal gyrus-potentially representing a low-grade glioma, scattered foci of increased T2 white matter signal-likely related to chronic small vessel disease      Disposition: Mr. Tompson appears stable.  He is completing a cycle of pomalidomide.  This cycle was delayed by 1 week due to drug delivery issues.  Daratumumab has been delayed for a week secondary to the recent brain biopsy.  He will continue pomalidomide and daratumumab.  He takes Decadron on the day of and day following daratumumab therapy.  Mr. Normoyle is in clinical remission from multiple myeloma.  The serum  light chains were normal last month.  The low-level serum M spike may be related to interference from daratumumab.  He will complete 3 more doses of daratumumab on a 2-week schedule and then switch to monthly daratumumab.  Mr. Cozart will return for an office visit, daratumumab, and Zometa in 2 weeks.    Betsy Coder, MD  03/29/2020  8:42 AM

## 2020-03-30 LAB — PROTEIN ELECTROPHORESIS, SERUM
A/G Ratio: 1.6 (ref 0.7–1.7)
Albumin ELP: 3.7 g/dL (ref 2.9–4.4)
Alpha-1-Globulin: 0.2 g/dL (ref 0.0–0.4)
Alpha-2-Globulin: 0.9 g/dL (ref 0.4–1.0)
Beta Globulin: 1 g/dL (ref 0.7–1.3)
Gamma Globulin: 0.2 g/dL — ABNORMAL LOW (ref 0.4–1.8)
Globulin, Total: 2.3 g/dL (ref 2.2–3.9)
Total Protein ELP: 6 g/dL (ref 6.0–8.5)

## 2020-03-30 LAB — KAPPA/LAMBDA LIGHT CHAINS
Kappa free light chain: 8 mg/L (ref 3.3–19.4)
Kappa, lambda light chain ratio: 1.01 (ref 0.26–1.65)
Lambda free light chains: 7.9 mg/L (ref 5.7–26.3)

## 2020-03-30 LAB — IGG: IgG (Immunoglobin G), Serum: 221 mg/dL — ABNORMAL LOW (ref 603–1613)

## 2020-04-01 ENCOUNTER — Telehealth: Payer: Self-pay | Admitting: Oncology

## 2020-04-01 NOTE — Telephone Encounter (Signed)
Scheduled per los. Called and spoke with tracey. Confirmed appt

## 2020-04-06 NOTE — Progress Notes (Signed)
Pharmacist Chemotherapy Monitoring - Follow Up Assessment    I verify that I have reviewed each item in the below checklist:  . Regimen for the patient is scheduled for the appropriate day and plan matches scheduled date. Marland Kitchen Appropriate non-routine labs are ordered dependent on drug ordered. . If applicable, additional medications reviewed and ordered per protocol based on lifetime cumulative doses and/or treatment regimen.   Plan for follow-up and/or issues identified: No . I-vent associated with next due treatment: No . MD and/or nursing notified: No  Damyah Gugel D 04/06/2020 4:41 PM

## 2020-04-08 ENCOUNTER — Other Ambulatory Visit: Payer: Self-pay | Admitting: Oncology

## 2020-04-08 DIAGNOSIS — C9000 Multiple myeloma not having achieved remission: Secondary | ICD-10-CM

## 2020-04-10 ENCOUNTER — Other Ambulatory Visit: Payer: Self-pay | Admitting: Oncology

## 2020-04-12 ENCOUNTER — Inpatient Hospital Stay: Payer: Medicare Other | Attending: Nurse Practitioner | Admitting: Nurse Practitioner

## 2020-04-12 ENCOUNTER — Encounter: Payer: Self-pay | Admitting: Nurse Practitioner

## 2020-04-12 ENCOUNTER — Inpatient Hospital Stay: Payer: Medicare Other

## 2020-04-12 ENCOUNTER — Other Ambulatory Visit: Payer: Self-pay

## 2020-04-12 VITALS — BP 132/80 | HR 71 | Temp 98.5°F | Resp 18 | Ht 74.0 in | Wt 252.7 lb

## 2020-04-12 DIAGNOSIS — Z79899 Other long term (current) drug therapy: Secondary | ICD-10-CM | POA: Insufficient documentation

## 2020-04-12 DIAGNOSIS — Z5112 Encounter for antineoplastic immunotherapy: Secondary | ICD-10-CM | POA: Diagnosis present

## 2020-04-12 DIAGNOSIS — C9000 Multiple myeloma not having achieved remission: Secondary | ICD-10-CM

## 2020-04-12 DIAGNOSIS — I10 Essential (primary) hypertension: Secondary | ICD-10-CM | POA: Diagnosis not present

## 2020-04-12 DIAGNOSIS — F329 Major depressive disorder, single episode, unspecified: Secondary | ICD-10-CM | POA: Insufficient documentation

## 2020-04-12 DIAGNOSIS — R531 Weakness: Secondary | ICD-10-CM | POA: Insufficient documentation

## 2020-04-12 DIAGNOSIS — E119 Type 2 diabetes mellitus without complications: Secondary | ICD-10-CM | POA: Diagnosis not present

## 2020-04-12 DIAGNOSIS — I6782 Cerebral ischemia: Secondary | ICD-10-CM | POA: Insufficient documentation

## 2020-04-12 DIAGNOSIS — G893 Neoplasm related pain (acute) (chronic): Secondary | ICD-10-CM | POA: Insufficient documentation

## 2020-04-12 LAB — BASIC METABOLIC PANEL - CANCER CENTER ONLY
Anion gap: 14 (ref 5–15)
BUN: 19 mg/dL (ref 6–20)
CO2: 23 mmol/L (ref 22–32)
Calcium: 9.4 mg/dL (ref 8.9–10.3)
Chloride: 101 mmol/L (ref 98–111)
Creatinine: 1.38 mg/dL — ABNORMAL HIGH (ref 0.61–1.24)
GFR, Est AFR Am: >60 mL/min (ref >60)
GFR, Estimated: 58 mL/min — ABNORMAL LOW (ref >60)
Glucose, Bld: 122 mg/dL — ABNORMAL HIGH (ref 70–99)
Potassium: 4.4 mmol/L (ref 3.5–5.1)
Sodium: 138 mmol/L (ref 135–145)

## 2020-04-12 LAB — CBC WITH DIFFERENTIAL (CANCER CENTER ONLY)
Abs Immature Granulocytes: 0.04 10*3/uL (ref 0.00–0.07)
Basophils Absolute: 0 10*3/uL (ref 0.0–0.1)
Basophils Relative: 1 %
Eosinophils Absolute: 0.1 10*3/uL (ref 0.0–0.5)
Eosinophils Relative: 2 %
HCT: 36.6 % — ABNORMAL LOW (ref 39.0–52.0)
Hemoglobin: 12.7 g/dL — ABNORMAL LOW (ref 13.0–17.0)
Immature Granulocytes: 1 %
Lymphocytes Relative: 18 %
Lymphs Abs: 0.8 10*3/uL (ref 0.7–4.0)
MCH: 33.7 pg (ref 26.0–34.0)
MCHC: 34.7 g/dL (ref 30.0–36.0)
MCV: 97.1 fL (ref 80.0–100.0)
Monocytes Absolute: 0.7 10*3/uL (ref 0.1–1.0)
Monocytes Relative: 16 %
Neutro Abs: 2.9 10*3/uL (ref 1.7–7.7)
Neutrophils Relative %: 62 %
Platelet Count: 197 10*3/uL (ref 150–400)
RBC: 3.77 MIL/uL — ABNORMAL LOW (ref 4.22–5.81)
RDW: 13.5 % (ref 11.5–15.5)
WBC Count: 4.7 10*3/uL (ref 4.0–10.5)
nRBC: 0 % (ref 0.0–0.2)

## 2020-04-12 MED ORDER — ACETAMINOPHEN 325 MG PO TABS
ORAL_TABLET | ORAL | Status: AC
  Filled 2020-04-12: qty 2

## 2020-04-12 MED ORDER — DEXAMETHASONE 4 MG PO TABS
ORAL_TABLET | ORAL | Status: AC
  Filled 2020-04-12: qty 5

## 2020-04-12 MED ORDER — DEXAMETHASONE 4 MG PO TABS
20.0000 mg | ORAL_TABLET | Freq: Once | ORAL | Status: AC
  Administered 2020-04-12: 20 mg via ORAL

## 2020-04-12 MED ORDER — ACETAMINOPHEN 325 MG PO TABS
650.0000 mg | ORAL_TABLET | Freq: Once | ORAL | Status: AC
  Administered 2020-04-12: 650 mg via ORAL

## 2020-04-12 MED ORDER — SODIUM CHLORIDE 0.9 % IV SOLN
Freq: Once | INTRAVENOUS | Status: AC
  Filled 2020-04-12: qty 250

## 2020-04-12 MED ORDER — ZOLEDRONIC ACID 4 MG/100ML IV SOLN
INTRAVENOUS | Status: AC
  Filled 2020-04-12: qty 100

## 2020-04-12 MED ORDER — DARATUMUMAB-HYALURONIDASE-FIHJ 1800-30000 MG-UT/15ML ~~LOC~~ SOLN
1800.0000 mg | Freq: Once | SUBCUTANEOUS | Status: AC
  Administered 2020-04-12: 1800 mg via SUBCUTANEOUS
  Filled 2020-04-12: qty 15

## 2020-04-12 MED ORDER — DIPHENHYDRAMINE HCL 25 MG PO CAPS
50.0000 mg | ORAL_CAPSULE | Freq: Once | ORAL | Status: AC
  Administered 2020-04-12: 50 mg via ORAL

## 2020-04-12 MED ORDER — DIPHENHYDRAMINE HCL 25 MG PO CAPS
ORAL_CAPSULE | ORAL | Status: AC
  Filled 2020-04-12: qty 2

## 2020-04-12 MED ORDER — ZOLEDRONIC ACID 4 MG/100ML IV SOLN
4.0000 mg | Freq: Once | INTRAVENOUS | Status: AC
  Administered 2020-04-12: 4 mg via INTRAVENOUS

## 2020-04-12 NOTE — Patient Instructions (Signed)
[facility] Discharge Instructions for Patients Receiving Chemotherapy  Today you received the following chemotherapy agents: Darzalex Faspro, Zometa  To help prevent nausea and vomiting after your treatment, we encourage you to take your nausea medication as directed.   If you develop nausea and vomiting that is not controlled by your nausea medication, call the clinic.   BELOW ARE SYMPTOMS THAT SHOULD BE REPORTED IMMEDIATELY:  *FEVER GREATER THAN 100.5 F  *CHILLS WITH OR WITHOUT FEVER  NAUSEA AND VOMITING THAT IS NOT CONTROLLED WITH YOUR NAUSEA MEDICATION  *UNUSUAL SHORTNESS OF BREATH  *UNUSUAL BRUISING OR BLEEDING  TENDERNESS IN MOUTH AND THROAT WITH OR WITHOUT PRESENCE OF ULCERS  *URINARY PROBLEMS  *BOWEL PROBLEMS  UNUSUAL RASH Items with * indicate a potential emergency and should be followed up as soon as possible.  Feel free to call the clinic should you have any questions or concerns. The clinic phone number is [phone].  Please show the CHEMO ALERT CARD at check-in to the Emergency Department and triage nurse.   

## 2020-04-12 NOTE — Progress Notes (Signed)
[place] OFFICE PROGRESS NOTE   Diagnosis: Multiple myeloma  INTERVAL HISTORY:   Steve Carter returns as scheduled.  He continues daratumumab, pomalidomide, Decadron.  Overall he feels well.  Main complaint is feeling weak.  No nausea or vomiting.  No mouth sores.  No diarrhea.  He denies pain.  He has a good appetite.  No wheezing or shortness of breath.  Objective:  Vital signs in last 24 hours:  Blood pressure 132/80, pulse 71, temperature 98.5 F (36.9 C), temperature source Temporal, resp. rate 18, height '6\' 2"'$  (1.88 m), weight 252 lb 11.2 oz (114.6 kg), SpO2 100 %.    HEENT: Mild white coating over tongue.  No buccal thrush. Resp: Lungs clear bilaterally. Cardio: Regular rate and rhythm. GI: Abdomen soft and nontender.  No hepatosplenomegaly. Vascular: No leg edema.  Lab Results:  Lab Results  Component Value Date   WBC 4.7 04/12/2020   HGB 12.7 (L) 04/12/2020   HCT 36.6 (L) 04/12/2020   MCV 97.1 04/12/2020   PLT 197 04/12/2020   NEUTROABS 2.9 04/12/2020    Imaging:  No results found.  Medications: I have reviewed the patient's current medications.  Assessment/Plan: Multiple myeloma-IgG lambda serum monoclonal protein, elevated serum lambda light chains  Bone survey 10/11/2017-lytic lesions noted in the skull, right iliac, left second rib, and mottled appearance of the proximal femurs/pelvis  Bone marrow biopsy 10/14/2017-hypercellular marrow with plasma cell neoplasm, 82% plasma cells, lambda light chain restricted, hyperdiploid with gains of chromosomes 3, 5, 7, 9, and 11. FISH panel positive for gain of ATM(+11)  Cycle 1 RVD 10/18/2017 (Revlimid started 10/23/2017)  Cycle 2 RVD 11/15/2017  Cycle 3 RVD 12/13/2017  Cycle 4 RVD 01/14/2018  Cycle 5 of RVD 02/14/2018 (Revlimid given for 7 days and 1 week of Velcade), therapy held beginning 02/21/2018 secondary to plan for stem cell therapy  Bone marrow biopsy 02/20/2018, 1-2% plasma  cell  PET scan 02/20/2018, no malignant range activity above background, numerous lytic lesions throughout the axial and appendicular skeleton,, left iliac wing fracture  Melphalan, 200 mg/m on 03/24/2018  Stem cell infusion 03/25/2018  Restaging at [facility] 07/09/2018: Normal lambda light chains, no serum M spike, bone marrow biopsy negative for myeloma, less than 1% plasma cells  PET scan at [facility] 07/09/2018-lytic bone lesions, no malignant range activity  Initiation of maintenance Revlimid, 10 mg, 21/28 days 08/15/2018  Cycle 2 maintenance Revlimid 09/12/2018  Cycle 3 maintenance Revlimid 10/10/2018  Revlimid placed on hold 06/15/2019 due to presyncopal/syncopal episodes and diarrhea  Revlimid resumed 5 mg 21 days on/7 days off following office visit 06/29/2019  Revlimidplaced on hold 07/14/2019  Revlimid resumed 08/21/2019  Bone survey 09/09/2019-no acute findings or clear explanation for right buttock pain. Lucent lesions in the right pelvis are stable without pathologic fracture. Evidence of healing lytic lesions in the right scapula, cervical spine spinous processes and left L4 transverse process. Possible mild progression of lytic lesions within the L1 and L4 vertebral bodies. Stable lytic lesions in the calvarium.  PET scan 09/25/2019-new FDG avid bone lesions in the right humeral neck and sacrum similar remaining lytic lesions with FDG activity below background  Bone marrow biopsy at [facility] on 10/01/2019-5% plasma cells on the bone marrow biopsy suboptimal sample  Cycle 1 daratumumab, pomalidomide, Decadron 10/19/2019  Zometa 10/19/2019  Cycle 2 daratumumab, pomalidomide, Decadron 11/16/2019 (he will begin the pomalidomide 11/17/2019)  Cycle 3 daratumumab, pomalidomide, and Decadron 12/14/2019  Cycle 4 daratumumab, pomalidomide, and Decadron  01/11/2020  Cycle 5 daratumumab, pomalidomide, Decadron 02/03/2020 (he began pomalidomide on 02/06/2020)  Cycle 6  daratumumab, pomalidomide, Decadron 03/02/2020 (pomalidomide starting 03/12/2020)  Cycle 7 daratumumab, pomalidomide, Decadron 03/29/2020 (pomalidomide scheduled to start 04/15/2020)   2.Pain secondary to multiple myeloma involving the spine and pelvis-resolved  MRI of the lumbar spine 10/02/2018-numerous rounded foci in the vertebral bodies, hypertrophy of the L4 transverse process  3.Hypertension-losartan dose increased 11/15/2017  4.Depression-improved with Wellbutrin beginning September 2020  5.Altered mental status-improved depression related? 6.Diabetes 7.  Recurrent episodes of fall/syncope-etiology unclear-evaluated by neurology at [facility], MRI brain 01/25/2020 with abnormal signal at the left superior frontal gyrus-potentially representing a low-grade glioma, scattered foci of increased T2 white matter signal-likely related to chronic small vessel disease   Disposition: Steve Carter appears stable.  He is tolerating treatment well.  Plan to proceed with daratumumab today as scheduled.  He will begin the next cycle of pomalidomide once he receives the shipment, estimated to be 04/15/2020.  We reviewed the CBC from today.  Counts adequate to proceed as above.  He will return for lab, follow-up, daratumumab in 3 weeks (he would typically return in 2 weeks but they will be out of town).    [name] ANP/GNP-BC   04/12/2020  2:07 PM

## 2020-04-14 ENCOUNTER — Telehealth: Payer: Self-pay | Admitting: Nurse Practitioner

## 2020-04-14 NOTE — Telephone Encounter (Signed)
Scheduled per los. Called and spoke with patients wife. Confirmed apt

## 2020-04-27 NOTE — Progress Notes (Signed)
Pharmacist Chemotherapy Monitoring - Follow Up Assessment    I verify that I have reviewed each item in the below checklist:  . Regimen for the patient is scheduled for the appropriate day and plan matches scheduled date. Marland Kitchen Appropriate non-routine labs are ordered dependent on drug ordered. . If applicable, additional medications reviewed and ordered per protocol based on lifetime cumulative doses and/or treatment regimen.   Plan for follow-up and/or issues identified: No . I-vent associated with next due treatment: No . MD and/or nursing notified: No  Philomena Course 04/27/2020 12:02 PM

## 2020-05-02 ENCOUNTER — Other Ambulatory Visit: Payer: Self-pay | Admitting: Oncology

## 2020-05-04 ENCOUNTER — Other Ambulatory Visit: Payer: Self-pay | Admitting: *Deleted

## 2020-05-04 ENCOUNTER — Encounter: Payer: Self-pay | Admitting: Oncology

## 2020-05-04 ENCOUNTER — Inpatient Hospital Stay: Payer: Medicare Other

## 2020-05-04 ENCOUNTER — Other Ambulatory Visit: Payer: BC Managed Care – PPO

## 2020-05-04 ENCOUNTER — Ambulatory Visit: Payer: BC Managed Care – PPO | Admitting: Oncology

## 2020-05-04 ENCOUNTER — Inpatient Hospital Stay: Payer: Medicare Other | Admitting: Oncology

## 2020-05-04 ENCOUNTER — Other Ambulatory Visit: Payer: Self-pay

## 2020-05-04 ENCOUNTER — Inpatient Hospital Stay: Payer: Medicare Other | Attending: Nurse Practitioner

## 2020-05-04 VITALS — BP 125/74 | HR 71 | Temp 97.5°F | Resp 17 | Ht 74.0 in | Wt 257.8 lb

## 2020-05-04 DIAGNOSIS — C9000 Multiple myeloma not having achieved remission: Secondary | ICD-10-CM

## 2020-05-04 DIAGNOSIS — E119 Type 2 diabetes mellitus without complications: Secondary | ICD-10-CM | POA: Insufficient documentation

## 2020-05-04 DIAGNOSIS — Z79899 Other long term (current) drug therapy: Secondary | ICD-10-CM | POA: Insufficient documentation

## 2020-05-04 DIAGNOSIS — I10 Essential (primary) hypertension: Secondary | ICD-10-CM | POA: Insufficient documentation

## 2020-05-04 DIAGNOSIS — F329 Major depressive disorder, single episode, unspecified: Secondary | ICD-10-CM | POA: Diagnosis not present

## 2020-05-04 DIAGNOSIS — R768 Other specified abnormal immunological findings in serum: Secondary | ICD-10-CM | POA: Insufficient documentation

## 2020-05-04 DIAGNOSIS — R4182 Altered mental status, unspecified: Secondary | ICD-10-CM | POA: Insufficient documentation

## 2020-05-04 DIAGNOSIS — G893 Neoplasm related pain (acute) (chronic): Secondary | ICD-10-CM | POA: Diagnosis not present

## 2020-05-04 DIAGNOSIS — I6782 Cerebral ischemia: Secondary | ICD-10-CM | POA: Diagnosis not present

## 2020-05-04 DIAGNOSIS — Z5112 Encounter for antineoplastic immunotherapy: Secondary | ICD-10-CM | POA: Insufficient documentation

## 2020-05-04 DIAGNOSIS — R197 Diarrhea, unspecified: Secondary | ICD-10-CM | POA: Insufficient documentation

## 2020-05-04 LAB — CBC WITH DIFFERENTIAL (CANCER CENTER ONLY)
Abs Immature Granulocytes: 0.04 10*3/uL (ref 0.00–0.07)
Basophils Absolute: 0.1 10*3/uL (ref 0.0–0.1)
Basophils Relative: 2 %
Eosinophils Absolute: 0.3 10*3/uL (ref 0.0–0.5)
Eosinophils Relative: 7 %
HCT: 34.5 % — ABNORMAL LOW (ref 39.0–52.0)
Hemoglobin: 11.9 g/dL — ABNORMAL LOW (ref 13.0–17.0)
Immature Granulocytes: 1 %
Lymphocytes Relative: 16 %
Lymphs Abs: 0.7 10*3/uL (ref 0.7–4.0)
MCH: 33.9 pg (ref 26.0–34.0)
MCHC: 34.5 g/dL (ref 30.0–36.0)
MCV: 98.3 fL (ref 80.0–100.0)
Monocytes Absolute: 0.8 10*3/uL (ref 0.1–1.0)
Monocytes Relative: 19 %
Neutro Abs: 2.3 10*3/uL (ref 1.7–7.7)
Neutrophils Relative %: 55 %
Platelet Count: 138 10*3/uL — ABNORMAL LOW (ref 150–400)
RBC: 3.51 MIL/uL — ABNORMAL LOW (ref 4.22–5.81)
RDW: 13.8 % (ref 11.5–15.5)
WBC Count: 4.2 10*3/uL (ref 4.0–10.5)
nRBC: 0 % (ref 0.0–0.2)

## 2020-05-04 LAB — CMP (CANCER CENTER ONLY)
ALT: 34 U/L (ref 0–44)
AST: 16 U/L (ref 15–41)
Albumin: 3.9 g/dL (ref 3.5–5.0)
Alkaline Phosphatase: 44 U/L (ref 38–126)
Anion gap: 13 (ref 5–15)
BUN: 20 mg/dL (ref 6–20)
CO2: 26 mmol/L (ref 22–32)
Calcium: 8.9 mg/dL (ref 8.9–10.3)
Chloride: 100 mmol/L (ref 98–111)
Creatinine: 1.46 mg/dL — ABNORMAL HIGH (ref 0.61–1.24)
GFR, Est AFR Am: >60 mL/min (ref >60)
GFR, Estimated: 54 mL/min — ABNORMAL LOW (ref >60)
Glucose, Bld: 155 mg/dL — ABNORMAL HIGH (ref 70–99)
Potassium: 4.5 mmol/L (ref 3.5–5.1)
Sodium: 139 mmol/L (ref 135–145)
Total Bilirubin: 0.3 mg/dL (ref 0.3–1.2)
Total Protein: 6.4 g/dL — ABNORMAL LOW (ref 6.5–8.1)

## 2020-05-04 MED ORDER — DIPHENHYDRAMINE HCL 25 MG PO CAPS
ORAL_CAPSULE | ORAL | Status: AC
  Filled 2020-05-04: qty 2

## 2020-05-04 MED ORDER — DARATUMUMAB-HYALURONIDASE-FIHJ 1800-30000 MG-UT/15ML ~~LOC~~ SOLN
1800.0000 mg | Freq: Once | SUBCUTANEOUS | Status: AC
  Administered 2020-05-04: 1800 mg via SUBCUTANEOUS
  Filled 2020-05-04: qty 15

## 2020-05-04 MED ORDER — DEXAMETHASONE 4 MG PO TABS
ORAL_TABLET | ORAL | Status: AC
  Filled 2020-05-04: qty 5

## 2020-05-04 MED ORDER — POMALIDOMIDE 3 MG PO CAPS: ORAL_CAPSULE | ORAL | 0 refills | Status: DC

## 2020-05-04 MED ORDER — DIPHENHYDRAMINE HCL 25 MG PO CAPS
50.0000 mg | ORAL_CAPSULE | Freq: Once | ORAL | Status: AC
  Administered 2020-05-04: 50 mg via ORAL

## 2020-05-04 MED ORDER — ACETAMINOPHEN 325 MG PO TABS
ORAL_TABLET | ORAL | Status: AC
  Filled 2020-05-04: qty 2

## 2020-05-04 MED ORDER — ACETAMINOPHEN 325 MG PO TABS
650.0000 mg | ORAL_TABLET | Freq: Once | ORAL | Status: AC
  Administered 2020-05-04: 650 mg via ORAL

## 2020-05-04 MED ORDER — DEXAMETHASONE 4 MG PO TABS
20.0000 mg | ORAL_TABLET | Freq: Once | ORAL | Status: AC
  Administered 2020-05-04: 20 mg via ORAL

## 2020-05-04 NOTE — Patient Instructions (Signed)
[facility] Discharge Instructions for Patients Receiving Chemotherapy  Today you received the following chemotherapy agents: Darzalex Faspro  To help prevent nausea and vomiting after your treatment, we encourage you to take your nausea medication as directed.    If you develop nausea and vomiting that is not controlled by your nausea medication, call the clinic.   BELOW ARE SYMPTOMS THAT SHOULD BE REPORTED IMMEDIATELY:  *FEVER GREATER THAN 100.5 F  *CHILLS WITH OR WITHOUT FEVER  NAUSEA AND VOMITING THAT IS NOT CONTROLLED WITH YOUR NAUSEA MEDICATION  *UNUSUAL SHORTNESS OF BREATH  *UNUSUAL BRUISING OR BLEEDING  TENDERNESS IN MOUTH AND THROAT WITH OR WITHOUT PRESENCE OF ULCERS  *URINARY PROBLEMS  *BOWEL PROBLEMS  UNUSUAL RASH Items with * indicate a potential emergency and should be followed up as soon as possible.  Feel free to call the clinic should you have any questions or concerns. The clinic phone number is [phone].  Please show the CHEMO ALERT CARD at check-in to the Emergency Department and triage nurse.   

## 2020-05-04 NOTE — Progress Notes (Signed)
[place] OFFICE PROGRESS NOTE   Diagnosis: Multiple myeloma  INTERVAL HISTORY:   Steve Carter began another cycle of pomalidomide on 04/15/2020.  No rash, peripheral neuropathy symptoms, or diarrhea.  He continues to feel tremulous.  He is followed in neurology at [facility].  No jaw pain.  No symptom of an allergic reaction with daratumumab.  No cough or shortness of breath.  No recurrent fall.  Objective:  Vital signs in last 24 hours:  Blood pressure 125/74, pulse 71, temperature (!) 97.5 F (36.4 C), temperature source Temporal, resp. rate 17, height '6\' 2"'$  (1.88 m), weight 257 lb 12.8 oz (116.9 kg), SpO2 100 %.    HEENT: No thrush or ulcers Resp: Lungs clear bilaterally Cardio: Regular rate and rhythm GI: No hepatosplenomegaly, nontender Vascular: No leg edema, the left lower leg is slightly larger than the right side Neuro: Tremor of hands     Lab Results:  Lab Results  Component Value Date   WBC 4.2 05/04/2020   HGB 11.9 (L) 05/04/2020   HCT 34.5 (L) 05/04/2020   MCV 98.3 05/04/2020   PLT 138 (L) 05/04/2020   NEUTROABS 2.3 05/04/2020    CMP  Lab Results  Component Value Date   NA 138 04/12/2020   K 4.4 04/12/2020   CL 101 04/12/2020   CO2 23 04/12/2020   GLUCOSE 122 (H) 04/12/2020   BUN 19 04/12/2020   CREATININE 1.38 (H) 04/12/2020   CALCIUM 9.4 04/12/2020   PROT 6.3 (L) 03/29/2020   ALBUMIN 3.9 03/29/2020   AST 20 03/29/2020   ALT 43 03/29/2020   ALKPHOS 53 03/29/2020   BILITOT 0.4 03/29/2020   GFRNONAA 58 (L) 04/12/2020   GFRAA >60 04/12/2020    Medications: I have reviewed the patient's current medications.   Assessment/Plan: 1. Multiple myeloma-IgG lambda serum monoclonal protein, elevated serum lambda light chains  Bone survey 10/11/2017-lytic lesions noted in the skull, right iliac, left second rib, and mottled appearance of the proximal femurs/pelvis  Bone marrow biopsy 10/14/2017-hypercellular marrow with plasma  cell neoplasm, 82% plasma cells, lambda light chain restricted, hyperdiploid with gains of chromosomes 3, 5, 7, 9, and 11. FISH panel positive for gain of ATM(+11)  Cycle 1 RVD 10/18/2017 (Revlimid started 10/23/2017)  Cycle 2 RVD 11/15/2017  Cycle 3 RVD 12/13/2017  Cycle 4 RVD 01/14/2018  Cycle 5 of RVD 02/14/2018 (Revlimid given for 7 days and 1 week of Velcade), therapy held beginning 02/21/2018 secondary to plan for stem cell therapy  Bone marrow biopsy 02/20/2018, 1-2% plasma cell  PET scan 02/20/2018, no malignant range activity above background, numerous lytic lesions throughout the axial and appendicular skeleton,, left iliac wing fracture  Melphalan, 200 mg/m on 03/24/2018  Stem cell infusion 03/25/2018  Restaging at [facility] 07/09/2018: Normal lambda light chains, no serum M spike, bone marrow biopsy negative for myeloma, less than 1% plasma cells  PET scan at [facility] 07/09/2018-lytic bone lesions, no malignant range activity  Initiation of maintenance Revlimid, 10 mg, 21/28 days 08/15/2018  Cycle 2 maintenance Revlimid 09/12/2018  Cycle 3 maintenance Revlimid 10/10/2018  Revlimid placed on hold 06/15/2019 due to presyncopal/syncopal episodes and diarrhea  Revlimid resumed 5 mg 21 days on/7 days off following office visit 06/29/2019  Revlimidplaced on hold 07/14/2019  Revlimid resumed 08/21/2019  Bone survey 09/09/2019-no acute findings or clear explanation for right buttock pain. Lucent lesions in the right pelvis are stable without pathologic fracture. Evidence of healing lytic lesions in the right scapula, cervical  spine spinous processes and left L4 transverse process. Possible mild progression of lytic lesions within the L1 and L4 vertebral bodies. Stable lytic lesions in the calvarium.  PET scan 09/25/2019-new FDG avid bone lesions in the right humeral neck and sacrum similar remaining lytic lesions with FDG activity below background  Bone marrow biopsy at [facility] on 10/01/2019-5% plasma cells on the bone marrow biopsy suboptimal sample  Cycle 1 daratumumab, pomalidomide, Decadron 10/19/2019  Zometa 10/19/2019  Cycle 2 daratumumab, pomalidomide, Decadron 11/16/2019 (he will begin the pomalidomide 11/17/2019)  Cycle 3 daratumumab, pomalidomide, and Decadron 12/14/2019  Cycle 4 daratumumab, pomalidomide, and Decadron 01/11/2020  Cycle 5 daratumumab, pomalidomide, Decadron 02/03/2020 (he began pomalidomide on 02/06/2020)  Cycle 6 daratumumab, pomalidomide, Decadron 03/02/2020 (pomalidomide starting 03/12/2020)  Cycle 7 daratumumab, pomalidomide, Decadron 03/29/2020 (pomalidomide scheduled to start 04/15/2020)  Cycle 8 daratumumab, pomalidomide, Decadron 05/04/2020 (pomalidomide scheduled to start 05/13/2020)   2.Pain secondary to multiple myeloma involving the spine and pelvis-resolved  MRI of the lumbar spine 10/02/2018-numerous rounded foci in the vertebral bodies, hypertrophy of the L4 transverse process  3.Hypertension-losartan dose increased 11/15/2017  4.Depression-improved with Wellbutrin beginning September 2020  5.Altered mental status-improved depression related? 6.Diabetes 7.  Recurrent episodes of fall/syncope-etiology unclear-evaluated by neurology at [facility], MRI brain 01/25/2020 with abnormal signal at the left superior frontal gyrus-potentially representing a low-grade glioma, scattered foci of increased T2 white matter signal-likely related to chronic small vessel disease  Stereotactic brain biopsy at [facility] 03/14/2020-gliosis, no evidence of malignancy    Disposition: Steve Carter appears stable.  He will complete another treatment with daratumumab today.  He will complete the current cycle of pomalidomide tomorrow.  He will return for an office visit in the last 2-week dose of daratumumab on 05/18/2020.  We will follow up on the myeloma panel from today.  Daratumumab will be switched to monthly dosing  after the 05/18/2020 dose.  We will decide on continuing the current regimen versus switching to maintenance therapy based on the myeloma panel from today.  Steve Carter will be due for Zometa in August.  Betsy Coder, MD  05/04/2020  9:14 AM

## 2020-05-05 LAB — IGG: IgG (Immunoglobin G), Serum: 192 mg/dL — ABNORMAL LOW (ref 603–1613)

## 2020-05-05 LAB — PROTEIN ELECTROPHORESIS, SERUM
A/G Ratio: 1.5 (ref 0.7–1.7)
Albumin ELP: 3.5 g/dL (ref 2.9–4.4)
Alpha-1-Globulin: 0.2 g/dL (ref 0.0–0.4)
Alpha-2-Globulin: 0.9 g/dL (ref 0.4–1.0)
Beta Globulin: 1 g/dL (ref 0.7–1.3)
Gamma Globulin: 0.2 g/dL — ABNORMAL LOW (ref 0.4–1.8)
Globulin, Total: 2.3 g/dL (ref 2.2–3.9)
M-Spike, %: 0.1 g/dL — ABNORMAL HIGH
Total Protein ELP: 5.8 g/dL — ABNORMAL LOW (ref 6.0–8.5)

## 2020-05-05 LAB — KAPPA/LAMBDA LIGHT CHAINS
Kappa free light chain: 11.1 mg/L (ref 3.3–19.4)
Kappa, lambda light chain ratio: 0.97 (ref 0.26–1.65)
Lambda free light chains: 11.4 mg/L (ref 5.7–26.3)

## 2020-05-11 ENCOUNTER — Other Ambulatory Visit: Payer: Self-pay | Admitting: Adult Health

## 2020-05-11 DIAGNOSIS — F411 Generalized anxiety disorder: Secondary | ICD-10-CM

## 2020-05-11 DIAGNOSIS — F331 Major depressive disorder, recurrent, moderate: Secondary | ICD-10-CM

## 2020-05-15 ENCOUNTER — Other Ambulatory Visit: Payer: Self-pay | Admitting: Oncology

## 2020-05-16 ENCOUNTER — Encounter: Payer: Self-pay | Admitting: Oncology

## 2020-05-17 ENCOUNTER — Other Ambulatory Visit: Payer: Self-pay | Admitting: *Deleted

## 2020-05-17 DIAGNOSIS — C9000 Multiple myeloma not having achieved remission: Secondary | ICD-10-CM

## 2020-05-18 ENCOUNTER — Inpatient Hospital Stay: Payer: Medicare Other

## 2020-05-18 ENCOUNTER — Inpatient Hospital Stay (HOSPITAL_BASED_OUTPATIENT_CLINIC_OR_DEPARTMENT_OTHER): Payer: Medicare Other | Admitting: Nurse Practitioner

## 2020-05-18 ENCOUNTER — Encounter: Payer: Self-pay | Admitting: Nurse Practitioner

## 2020-05-18 ENCOUNTER — Other Ambulatory Visit: Payer: Self-pay

## 2020-05-18 VITALS — BP 113/79 | HR 75 | Temp 98.1°F | Resp 18 | Ht 74.0 in | Wt 262.3 lb

## 2020-05-18 DIAGNOSIS — C9000 Multiple myeloma not having achieved remission: Secondary | ICD-10-CM

## 2020-05-18 DIAGNOSIS — Z5112 Encounter for antineoplastic immunotherapy: Secondary | ICD-10-CM | POA: Diagnosis not present

## 2020-05-18 LAB — CBC WITH DIFFERENTIAL (CANCER CENTER ONLY)
Abs Immature Granulocytes: 0.14 10*3/uL — ABNORMAL HIGH (ref 0.00–0.07)
Basophils Absolute: 0.1 10*3/uL (ref 0.0–0.1)
Basophils Relative: 2 %
Eosinophils Absolute: 0.2 10*3/uL (ref 0.0–0.5)
Eosinophils Relative: 4 %
HCT: 34.7 % — ABNORMAL LOW (ref 39.0–52.0)
Hemoglobin: 12 g/dL — ABNORMAL LOW (ref 13.0–17.0)
Immature Granulocytes: 3 %
Lymphocytes Relative: 15 %
Lymphs Abs: 0.8 10*3/uL (ref 0.7–4.0)
MCH: 34.8 pg — ABNORMAL HIGH (ref 26.0–34.0)
MCHC: 34.6 g/dL (ref 30.0–36.0)
MCV: 100.6 fL — ABNORMAL HIGH (ref 80.0–100.0)
Monocytes Absolute: 0.9 10*3/uL (ref 0.1–1.0)
Monocytes Relative: 17 %
Neutro Abs: 3.2 10*3/uL (ref 1.7–7.7)
Neutrophils Relative %: 59 %
Platelet Count: 206 10*3/uL (ref 150–400)
RBC: 3.45 MIL/uL — ABNORMAL LOW (ref 4.22–5.81)
RDW: 14.2 % (ref 11.5–15.5)
WBC Count: 5.3 10*3/uL (ref 4.0–10.5)
nRBC: 0.8 % — ABNORMAL HIGH (ref 0.0–0.2)

## 2020-05-18 MED ORDER — DIPHENHYDRAMINE HCL 25 MG PO CAPS
50.0000 mg | ORAL_CAPSULE | Freq: Once | ORAL | Status: AC
  Administered 2020-05-18: 50 mg via ORAL

## 2020-05-18 MED ORDER — DEXAMETHASONE 4 MG PO TABS
20.0000 mg | ORAL_TABLET | Freq: Once | ORAL | Status: AC
  Administered 2020-05-18: 20 mg via ORAL

## 2020-05-18 MED ORDER — DARATUMUMAB-HYALURONIDASE-FIHJ 1800-30000 MG-UT/15ML ~~LOC~~ SOLN
1800.0000 mg | Freq: Once | SUBCUTANEOUS | Status: AC
  Administered 2020-05-18: 1800 mg via SUBCUTANEOUS
  Filled 2020-05-18: qty 15

## 2020-05-18 MED ORDER — DIPHENHYDRAMINE HCL 25 MG PO CAPS
ORAL_CAPSULE | ORAL | Status: AC
  Filled 2020-05-18: qty 2

## 2020-05-18 MED ORDER — ACETAMINOPHEN 325 MG PO TABS
650.0000 mg | ORAL_TABLET | Freq: Once | ORAL | Status: AC
  Administered 2020-05-18: 650 mg via ORAL

## 2020-05-18 MED ORDER — DEXAMETHASONE 4 MG PO TABS
ORAL_TABLET | ORAL | Status: AC
  Filled 2020-05-18: qty 5

## 2020-05-18 MED ORDER — ACETAMINOPHEN 325 MG PO TABS
ORAL_TABLET | ORAL | Status: AC
  Filled 2020-05-18: qty 2

## 2020-05-18 NOTE — Progress Notes (Signed)
Per [name], NP ok to proceed with CMET from 05/04/20.

## 2020-05-18 NOTE — Patient Instructions (Signed)
[facility] Discharge Instructions for Patients Receiving Chemotherapy  Today you received the following chemotherapy agents: Darzalex Faspro  To help prevent nausea and vomiting after your treatment, we encourage you to take your nausea medication as directed.    If you develop nausea and vomiting that is not controlled by your nausea medication, call the clinic.   BELOW ARE SYMPTOMS THAT SHOULD BE REPORTED IMMEDIATELY:  *FEVER GREATER THAN 100.5 F  *CHILLS WITH OR WITHOUT FEVER  NAUSEA AND VOMITING THAT IS NOT CONTROLLED WITH YOUR NAUSEA MEDICATION  *UNUSUAL SHORTNESS OF BREATH  *UNUSUAL BRUISING OR BLEEDING  TENDERNESS IN MOUTH AND THROAT WITH OR WITHOUT PRESENCE OF ULCERS  *URINARY PROBLEMS  *BOWEL PROBLEMS  UNUSUAL RASH Items with * indicate a potential emergency and should be followed up as soon as possible.  Feel free to call the clinic should you have any questions or concerns. The clinic phone number is [phone].  Please show the CHEMO ALERT CARD at check-in to the Emergency Department and triage nurse.   

## 2020-05-18 NOTE — Progress Notes (Addendum)
[place] OFFICE PROGRESS NOTE   Diagnosis: Multiple myeloma  INTERVAL HISTORY:   Mr. Bodkin returns as scheduled.  He completed another cycle of daratumumab 05/04/2020.  He denies nausea/vomiting.  He has occasional diarrhea.  No mouth sores.  No shortness of breath or wheezing.  Objective:  Vital signs in last 24 hours:  Blood pressure 113/79, pulse 75, temperature 98.1 F (36.7 C), temperature source Temporal, resp. rate 18, height 6' 2" (1.88 m), weight 262 lb 4.8 oz (119 kg), SpO2 100 %.    HEENT: No thrush or ulcers. Resp: Lungs clear bilaterally. Cardio: Regular rate and rhythm. GI: Abdomen soft and nontender.  No hepatomegaly. Vascular: No leg edema.    Lab Results:  Lab Results  Component Value Date   WBC 5.3 05/18/2020   HGB 12.0 (L) 05/18/2020   HCT 34.7 (L) 05/18/2020   MCV 100.6 (H) 05/18/2020   PLT 206 05/18/2020   NEUTROABS 3.2 05/18/2020    Imaging:  No results found.  Medications: I have reviewed the patient's current medications.  Assessment/Plan: 1. Multiple myeloma-IgG lambda serum monoclonal protein, elevated serum lambda light chains  Bone survey 10/11/2017-lytic lesions noted in the skull, right iliac, left second rib, and mottled appearance of the proximal femurs/pelvis  Bone marrow biopsy 10/14/2017-hypercellular marrow with plasma cell neoplasm, 82% plasma cells, lambda light chain restricted, hyperdiploid with gains of chromosomes 3, 5, 7, 9, and 11. FISH panel positive for gain of ATM(+11)  Cycle 1 RVD 10/18/2017 (Revlimid started 10/23/2017)  Cycle 2 RVD 11/15/2017  Cycle 3 RVD 12/13/2017  Cycle 4 RVD 01/14/2018  Cycle 5 of RVD 02/14/2018 (Revlimid given for 7 days and 1 week of Velcade), therapy held beginning 02/21/2018 secondary to plan for stem cell therapy  Bone marrow biopsy 02/20/2018, 1-2% plasma cell  PET scan 02/20/2018, no malignant range activity above background, numerous lytic lesions throughout  the axial and appendicular skeleton,, left iliac wing fracture  Melphalan, 200 mg/m on 03/24/2018  Stem cell infusion 03/25/2018  Restaging at [facility] 07/09/2018: Normal lambda light chains, no serum M spike, bone marrow biopsy negative for myeloma, less than 1% plasma cells  PET scan at [facility] 07/09/2018-lytic bone lesions, no malignant range activity  Initiation of maintenance Revlimid, 10 mg, 21/28 days 08/15/2018  Cycle 2 maintenance Revlimid 09/12/2018  Cycle 3 maintenance Revlimid 10/10/2018  Revlimid placed on hold 06/15/2019 due to presyncopal/syncopal episodes and diarrhea  Revlimid resumed 5 mg 21 days on/7 days off following office visit 06/29/2019  Revlimidplaced on hold 07/14/2019  Revlimid resumed 08/21/2019  Bone survey 09/09/2019-no acute findings or clear explanation for right buttock pain. Lucent lesions in the right pelvis are stable without pathologic fracture. Evidence of healing lytic lesions in the right scapula, cervical spine spinous processes and left L4 transverse process. Possible mild progression of lytic lesions within the L1 and L4 vertebral bodies. Stable lytic lesions in the calvarium.  PET scan 09/25/2019-new FDG avid bone lesions in the right humeral neck and sacrum similar remaining lytic lesions with FDG activity below background  Bone marrow biopsy at [facility] on 10/01/2019-5% plasma cells on the bone marrow biopsy suboptimal sample  Cycle 1 daratumumab, pomalidomide, Decadron 10/19/2019  Zometa 10/19/2019  Cycle 2 daratumumab, pomalidomide, Decadron 11/16/2019 (he will begin the pomalidomide 11/17/2019)  Cycle 3 daratumumab, pomalidomide, and Decadron 12/14/2019  Cycle 4 daratumumab, pomalidomide, and Decadron 01/11/2020  Cycle 5 daratumumab, pomalidomide, Decadron 02/03/2020 (he began pomalidomide on 02/06/2020)  Cycle 6 daratumumab, pomalidomide, Decadron  03/02/2020 (pomalidomide starting 03/12/2020)  Cycle 7 daratumumab,  pomalidomide, Decadron 03/29/2020 (pomalidomide scheduled to start 04/15/2020)  Cycle 8 daratumumab, pomalidomide, Decadron 05/04/2020 (pomalidomide started 05/14/2020)   2.Pain secondary to multiple myeloma involving the spine and pelvis-resolved  MRI of the lumbar spine 10/02/2018-numerous rounded foci in the vertebral bodies, hypertrophy of the L4 transverse process  3.Hypertension-losartan dose increased 11/15/2017  4.Depression-improved with Wellbutrin beginning September 2020  5.Altered mental status-improved depression related? 6.Diabetes 7. Recurrent episodes of fall/syncope-etiology unclear-evaluated by neurology at [facility], MRI brain 01/25/2020 with abnormal signal at the left superior frontal gyrus-potentially representing a low-grade glioma, scattered foci of increased T2 white matter signal-likely related to chronic small vessel disease  Stereotactic brain biopsy at [facility] 03/14/2020-gliosis, no evidence of malignancy    Disposition: Mr. Lawless appears stable.  He will complete another treatment with daratumumab today.  He will continue the current cycle of pomalidomide.  After today's treatment subsequent daratumumab will be given on a 4-week schedule.  Next pomalidomide cycle will be scheduled to begin on 06/15/2020.  He has a follow-up appointment at [facility] on 06/14/2020.  We will follow-up recommendations from that visit and adjust the above accordingly.  We reviewed the CBC from today.  Counts adequate to proceed as above.  He will return for lab, follow-up, daratumumab in 4 weeks.  He will contact the office in the interim with any problems.  Patient seen with Dr. Benay Spice.      [name] ANP/GNP-BC   05/18/2020  1:20 PM This was a shared visit with [name].  Mr. Dy appears unchanged.  The plan is to continue the current treatment regimen.  Julieanne Manson, MD

## 2020-05-19 ENCOUNTER — Telehealth: Payer: Self-pay | Admitting: Nurse Practitioner

## 2020-05-19 NOTE — Telephone Encounter (Signed)
Scheduled per los. Called, not able to leave msg. Mailed printout  

## 2020-06-01 ENCOUNTER — Other Ambulatory Visit: Payer: Self-pay | Admitting: Oncology

## 2020-06-01 DIAGNOSIS — C9000 Multiple myeloma not having achieved remission: Secondary | ICD-10-CM

## 2020-06-12 ENCOUNTER — Other Ambulatory Visit: Payer: Self-pay | Admitting: Oncology

## 2020-06-14 ENCOUNTER — Other Ambulatory Visit: Payer: Self-pay | Admitting: *Deleted

## 2020-06-14 DIAGNOSIS — C9000 Multiple myeloma not having achieved remission: Secondary | ICD-10-CM

## 2020-06-15 ENCOUNTER — Other Ambulatory Visit: Payer: Self-pay

## 2020-06-15 ENCOUNTER — Other Ambulatory Visit: Payer: Medicare Other

## 2020-06-15 ENCOUNTER — Inpatient Hospital Stay: Payer: Medicare Other

## 2020-06-15 ENCOUNTER — Inpatient Hospital Stay: Payer: Medicare Other | Attending: Nurse Practitioner | Admitting: Oncology

## 2020-06-15 VITALS — BP 120/79 | HR 73 | Temp 97.6°F | Resp 18 | Ht 74.0 in | Wt 257.6 lb

## 2020-06-15 DIAGNOSIS — C9000 Multiple myeloma not having achieved remission: Secondary | ICD-10-CM | POA: Insufficient documentation

## 2020-06-15 DIAGNOSIS — I10 Essential (primary) hypertension: Secondary | ICD-10-CM | POA: Insufficient documentation

## 2020-06-15 DIAGNOSIS — Z79899 Other long term (current) drug therapy: Secondary | ICD-10-CM | POA: Insufficient documentation

## 2020-06-15 DIAGNOSIS — Z5112 Encounter for antineoplastic immunotherapy: Secondary | ICD-10-CM | POA: Insufficient documentation

## 2020-06-15 DIAGNOSIS — I6782 Cerebral ischemia: Secondary | ICD-10-CM | POA: Insufficient documentation

## 2020-06-15 DIAGNOSIS — F329 Major depressive disorder, single episode, unspecified: Secondary | ICD-10-CM | POA: Insufficient documentation

## 2020-06-15 DIAGNOSIS — E119 Type 2 diabetes mellitus without complications: Secondary | ICD-10-CM | POA: Insufficient documentation

## 2020-06-15 DIAGNOSIS — G893 Neoplasm related pain (acute) (chronic): Secondary | ICD-10-CM | POA: Diagnosis not present

## 2020-06-15 MED ORDER — DIPHENHYDRAMINE HCL 25 MG PO CAPS
50.0000 mg | ORAL_CAPSULE | Freq: Once | ORAL | Status: AC
  Administered 2020-06-15: 50 mg via ORAL

## 2020-06-15 MED ORDER — DEXAMETHASONE 4 MG PO TABS
20.0000 mg | ORAL_TABLET | Freq: Once | ORAL | Status: AC
  Administered 2020-06-15: 20 mg via ORAL

## 2020-06-15 MED ORDER — DEXAMETHASONE 4 MG PO TABS
ORAL_TABLET | ORAL | Status: AC
  Filled 2020-06-15: qty 5

## 2020-06-15 MED ORDER — DARATUMUMAB-HYALURONIDASE-FIHJ 1800-30000 MG-UT/15ML ~~LOC~~ SOLN
1800.0000 mg | Freq: Once | SUBCUTANEOUS | Status: AC
  Administered 2020-06-15: 1800 mg via SUBCUTANEOUS
  Filled 2020-06-15: qty 15

## 2020-06-15 MED ORDER — DIPHENHYDRAMINE HCL 25 MG PO CAPS
ORAL_CAPSULE | ORAL | Status: AC
  Filled 2020-06-15: qty 2

## 2020-06-15 MED ORDER — ACETAMINOPHEN 325 MG PO TABS
ORAL_TABLET | ORAL | Status: AC
  Filled 2020-06-15: qty 2

## 2020-06-15 MED ORDER — ACETAMINOPHEN 325 MG PO TABS
650.0000 mg | ORAL_TABLET | Freq: Once | ORAL | Status: AC
  Administered 2020-06-15: 650 mg via ORAL

## 2020-06-15 NOTE — Progress Notes (Signed)
06/15/20  [facility]: 06/14/20  WBC: 6.0 Hgb: 13.3 Plt: 344   Henreitta Leber, PharmD

## 2020-06-15 NOTE — Progress Notes (Signed)
Dr. Benay Spice reviewed labs from yesterday at [facility], and wishes to proceed with treatment today. Abnormal: creatine 1.63.

## 2020-06-15 NOTE — Progress Notes (Signed)
[place] OFFICE PROGRESS NOTE   Diagnosis: Multiple myeloma  INTERVAL HISTORY:   Mr. Schubring returns as scheduled.  He completed another cycle of pomalidomide beginning 05/14/2020.  No new complaint.  He was seen at [facility] yesterday.  He reports they recommend holding pomalidomide with this cycle.  Objective:  Vital signs in last 24 hours:  Blood pressure 120/79, pulse 73, temperature 97.6 F (36.4 C), temperature source Temporal, resp. rate 18, height _0  (1.88 m), weight 257 lb 9.6 oz (116.8 kg), SpO2 100 %.    HEENT: No thrush Resp: Lungs clear bilaterally Cardio: Regular rate and rhythm GI: No hepatosplenomegaly Vascular: No leg edema   Lab Results:  Lab Results  Component Value Date   WBC 5.3 05/18/2020   HGB 12.0 (L) 05/18/2020   HCT 34.7 (L) 05/18/2020   MCV 100.6 (H) 05/18/2020   PLT 206 05/18/2020   NEUTROABS 3.2 05/18/2020    CMP  Lab Results  Component Value Date   NA 139 05/04/2020   K 4.5 05/04/2020   CL 100 05/04/2020   CO2 26 05/04/2020   GLUCOSE 155 (H) 05/04/2020   BUN 20 05/04/2020   CREATININE 1.46 (H) 05/04/2020   CALCIUM 8.9 05/04/2020   PROT 6.4 (L) 05/04/2020   ALBUMIN 3.9 05/04/2020   AST 16 05/04/2020   ALT 34 05/04/2020   ALKPHOS 44 05/04/2020   BILITOT 0.3 05/04/2020   GFRNONAA 54 (L) 05/04/2020   GFRAA >60 05/04/2020    Medications: I have reviewed the patient's current medications.   Assessment/Plan:  1. Multiple myeloma-IgG lambda serum monoclonal protein, elevated serum lambda light chains  Bone survey 10/11/2017-lytic lesions noted in the skull, right iliac, left second rib, and mottled appearance of the proximal femurs/pelvis  Bone marrow biopsy 10/14/2017-hypercellular marrow with plasma cell neoplasm, 82% plasma cells, lambda light chain restricted, hyperdiploid with gains of chromosomes 3, 5, 7, 9, and 11. FISH panel positive for gain of ATM(+11)  Cycle 1 RVD 10/18/2017 (Revlimid  started 10/23/2017)  Cycle 2 RVD 11/15/2017  Cycle 3 RVD 12/13/2017  Cycle 4 RVD 01/14/2018  Cycle 5 of RVD 02/14/2018 (Revlimid given for 7 days and 1 week of Velcade), therapy held beginning 02/21/2018 secondary to plan for stem cell therapy  Bone marrow biopsy 02/20/2018, 1-2% plasma cell  PET scan 02/20/2018, no malignant range activity above background, numerous lytic lesions throughout the axial and appendicular skeleton,, left iliac wing fracture  Melphalan, 200 mg/m on 03/24/2018  Stem cell infusion 03/25/2018  Restaging at [facility] 07/09/2018: Normal lambda light chains, no serum M spike, bone marrow biopsy negative for myeloma, less than 1% plasma cells  PET scan at [facility] 07/09/2018-lytic bone lesions, no malignant range activity  Initiation of maintenance Revlimid, 10 mg, 21/28 days 08/15/2018  Cycle 2 maintenance Revlimid 09/12/2018  Cycle 3 maintenance Revlimid 10/10/2018  Revlimid placed on hold 06/15/2019 due to presyncopal/syncopal episodes and diarrhea  Revlimid resumed 5 mg 21 days on/7 days off following office visit 06/29/2019  Revlimidplaced on hold 07/14/2019  Revlimid resumed 08/21/2019  Bone survey 09/09/2019-no acute findings or clear explanation for right buttock pain. Lucent lesions in the right pelvis are stable without pathologic fracture. Evidence of healing lytic lesions in the right scapula, cervical spine spinous processes and left L4 transverse process. Possible mild progression of lytic lesions within the L1 and L4 vertebral bodies. Stable lytic lesions in the calvarium.  PET scan 09/25/2019-new FDG avid bone lesions in the right humeral neck  and sacrum similar remaining lytic lesions with FDG activity below background  Bone marrow biopsy at [facility] on 10/01/2019-5% plasma cells on the bone marrow biopsy suboptimal sample  Cycle 1 daratumumab, pomalidomide, Decadron 10/19/2019  Zometa 10/19/2019  Cycle 2 daratumumab, pomalidomide,  Decadron 11/16/2019 (he will begin the pomalidomide 11/17/2019)  Cycle 3 daratumumab, pomalidomide, and Decadron 12/14/2019  Cycle 4 daratumumab, pomalidomide, and Decadron 01/11/2020  Cycle 5 daratumumab, pomalidomide, Decadron 02/03/2020 (he began pomalidomide on 02/06/2020)  Cycle 6 daratumumab, pomalidomide, Decadron 03/02/2020 (pomalidomide starting 03/12/2020)  Cycle 7 daratumumab, pomalidomide, Decadron 03/29/2020 (pomalidomide scheduled to start 04/15/2020)  Cycle 8 daratumumab, pomalidomide, Decadron 05/04/2020 (pomalidomide started 05/14/2020)  Cycle 9 daratumumab-monthly 06/15/2020, pomalidomide held   2.Pain secondary to multiple myeloma involving the spine and pelvis-resolved  MRI of the lumbar spine 10/02/2018-numerous rounded foci in the vertebral bodies, hypertrophy of the L4 transverse process  3.Hypertension-losartan dose increased 11/15/2017  4.Depression-improved with Wellbutrin beginning September 2020  5.Altered mental status-improved depression related? 6.Diabetes 7. Recurrent episodes of fall/syncope-etiology unclear-evaluated by neurology at [facility], MRI brain 01/25/2020 with abnormal signal at the left superior frontal gyrus-potentially representing a low-grade glioma, scattered foci of increased T2 white matter signal-likely related to chronic small vessel disease  Stereotactic brain biopsy at [facility] 03/14/2020-gliosis, no evidence of malignancy    Disposition: Mr. Kauth appears stable.  He will continue monthly daratumumab/Decadron.  Pomalidomide has been placed on hold per the recommendations of the [facility] transplant team.  The serum protein electrophoresis from yesterday is pending.  Ms. Wamble will contact us within the next few days if she has not been contacted by [facility] with a final treatment recommendation. He will complete another treatment with daratumumab today.  Mr. Benney will return for an office visit,  daratumumab, and Zometa in 1 month. Betsy Coder, MD  06/15/2020  12:51 PM

## 2020-06-15 NOTE — Patient Instructions (Signed)
[facility] Discharge Instructions for Patients Receiving Chemotherapy  Today you received the following chemotherapy agents: Darzalex Faspro  To help prevent nausea and vomiting after your treatment, we encourage you to take your nausea medication as directed.    If you develop nausea and vomiting that is not controlled by your nausea medication, call the clinic.   BELOW ARE SYMPTOMS THAT SHOULD BE REPORTED IMMEDIATELY:  *FEVER GREATER THAN 100.5 F  *CHILLS WITH OR WITHOUT FEVER  NAUSEA AND VOMITING THAT IS NOT CONTROLLED WITH YOUR NAUSEA MEDICATION  *UNUSUAL SHORTNESS OF BREATH  *UNUSUAL BRUISING OR BLEEDING  TENDERNESS IN MOUTH AND THROAT WITH OR WITHOUT PRESENCE OF ULCERS  *URINARY PROBLEMS  *BOWEL PROBLEMS  UNUSUAL RASH Items with * indicate a potential emergency and should be followed up as soon as possible.  Feel free to call the clinic should you have any questions or concerns. The clinic phone number is [phone].  Please show the CHEMO ALERT CARD at check-in to the Emergency Department and triage nurse.   

## 2020-06-17 ENCOUNTER — Telehealth: Payer: Self-pay | Admitting: Oncology

## 2020-06-17 ENCOUNTER — Encounter: Payer: Self-pay | Admitting: Oncology

## 2020-06-17 NOTE — Telephone Encounter (Signed)
Scheduled appts per 7/14 los. Pt's spouse confirmed appt date and time.

## 2020-06-18 ENCOUNTER — Encounter: Payer: Self-pay | Admitting: Oncology

## 2020-06-22 ENCOUNTER — Other Ambulatory Visit: Payer: Self-pay | Admitting: *Deleted

## 2020-06-22 ENCOUNTER — Encounter: Payer: Self-pay | Admitting: *Deleted

## 2020-06-22 DIAGNOSIS — C9000 Multiple myeloma not having achieved remission: Secondary | ICD-10-CM

## 2020-06-22 NOTE — Progress Notes (Signed)
Per Dr. Sherrill order: Referral sent with chart information to [facility] for increasing renal insufficiency in setting of multiple myeloma. 

## 2020-06-26 ENCOUNTER — Other Ambulatory Visit: Payer: Self-pay | Admitting: Oncology

## 2020-06-27 ENCOUNTER — Other Ambulatory Visit: Payer: Self-pay

## 2020-06-27 NOTE — Progress Notes (Signed)
Medication refill completed Acyclovir as requested

## 2020-07-05 ENCOUNTER — Encounter: Payer: Self-pay | Admitting: Oncology

## 2020-07-06 ENCOUNTER — Other Ambulatory Visit: Payer: Self-pay | Admitting: Oncology

## 2020-07-07 ENCOUNTER — Encounter: Payer: Self-pay | Admitting: *Deleted

## 2020-07-10 ENCOUNTER — Encounter: Payer: Self-pay | Admitting: Oncology

## 2020-07-10 ENCOUNTER — Other Ambulatory Visit: Payer: Self-pay | Admitting: Adult Health

## 2020-07-10 DIAGNOSIS — F331 Major depressive disorder, recurrent, moderate: Secondary | ICD-10-CM

## 2020-07-10 DIAGNOSIS — F411 Generalized anxiety disorder: Secondary | ICD-10-CM

## 2020-07-11 ENCOUNTER — Encounter: Payer: Self-pay | Admitting: Oncology

## 2020-07-13 ENCOUNTER — Inpatient Hospital Stay: Payer: Medicare Other

## 2020-07-13 ENCOUNTER — Other Ambulatory Visit: Payer: Self-pay

## 2020-07-13 ENCOUNTER — Inpatient Hospital Stay (HOSPITAL_BASED_OUTPATIENT_CLINIC_OR_DEPARTMENT_OTHER): Payer: Medicare Other | Admitting: Oncology

## 2020-07-13 ENCOUNTER — Inpatient Hospital Stay: Payer: Medicare Other | Attending: Nurse Practitioner

## 2020-07-13 VITALS — BP 133/84 | HR 81 | Temp 96.6°F | Resp 18 | Ht 74.0 in | Wt 257.4 lb

## 2020-07-13 DIAGNOSIS — F329 Major depressive disorder, single episode, unspecified: Secondary | ICD-10-CM | POA: Insufficient documentation

## 2020-07-13 DIAGNOSIS — Z79899 Other long term (current) drug therapy: Secondary | ICD-10-CM | POA: Diagnosis not present

## 2020-07-13 DIAGNOSIS — C9000 Multiple myeloma not having achieved remission: Secondary | ICD-10-CM

## 2020-07-13 DIAGNOSIS — G893 Neoplasm related pain (acute) (chronic): Secondary | ICD-10-CM | POA: Diagnosis not present

## 2020-07-13 DIAGNOSIS — I10 Essential (primary) hypertension: Secondary | ICD-10-CM | POA: Insufficient documentation

## 2020-07-13 DIAGNOSIS — N289 Disorder of kidney and ureter, unspecified: Secondary | ICD-10-CM | POA: Diagnosis not present

## 2020-07-13 DIAGNOSIS — E1142 Type 2 diabetes mellitus with diabetic polyneuropathy: Secondary | ICD-10-CM | POA: Diagnosis not present

## 2020-07-13 DIAGNOSIS — Z5112 Encounter for antineoplastic immunotherapy: Secondary | ICD-10-CM | POA: Diagnosis not present

## 2020-07-13 DIAGNOSIS — R4182 Altered mental status, unspecified: Secondary | ICD-10-CM | POA: Diagnosis not present

## 2020-07-13 DIAGNOSIS — I6782 Cerebral ischemia: Secondary | ICD-10-CM | POA: Diagnosis not present

## 2020-07-13 LAB — CMP (CANCER CENTER ONLY)
ALT: 40 U/L (ref 0–44)
AST: 25 U/L (ref 15–41)
Albumin: 4.3 g/dL (ref 3.5–5.0)
Alkaline Phosphatase: 61 U/L (ref 38–126)
Anion gap: 16 — ABNORMAL HIGH (ref 5–15)
BUN: 20 mg/dL (ref 6–20)
CO2: 20 mmol/L — ABNORMAL LOW (ref 22–32)
Calcium: 9.6 mg/dL (ref 8.9–10.3)
Chloride: 102 mmol/L (ref 98–111)
Creatinine: 1.52 mg/dL — ABNORMAL HIGH (ref 0.61–1.24)
GFR, Est AFR Am: 60 mL/min — ABNORMAL LOW (ref >60)
GFR, Estimated: 52 mL/min — ABNORMAL LOW (ref >60)
Glucose, Bld: 176 mg/dL — ABNORMAL HIGH (ref 70–99)
Potassium: 4.1 mmol/L (ref 3.5–5.1)
Sodium: 138 mmol/L (ref 135–145)
Total Bilirubin: 0.3 mg/dL (ref 0.3–1.2)
Total Protein: 7.1 g/dL (ref 6.5–8.1)

## 2020-07-13 LAB — CBC WITH DIFFERENTIAL (CANCER CENTER ONLY)
Abs Immature Granulocytes: 0.05 10*3/uL (ref 0.00–0.07)
Basophils Absolute: 0.1 10*3/uL (ref 0.0–0.1)
Basophils Relative: 1 %
Eosinophils Absolute: 0.3 10*3/uL (ref 0.0–0.5)
Eosinophils Relative: 3 %
HCT: 35.3 % — ABNORMAL LOW (ref 39.0–52.0)
Hemoglobin: 12.2 g/dL — ABNORMAL LOW (ref 13.0–17.0)
Immature Granulocytes: 1 %
Lymphocytes Relative: 13 %
Lymphs Abs: 1.1 10*3/uL (ref 0.7–4.0)
MCH: 33.3 pg (ref 26.0–34.0)
MCHC: 34.6 g/dL (ref 30.0–36.0)
MCV: 96.4 fL (ref 80.0–100.0)
Monocytes Absolute: 0.7 10*3/uL (ref 0.1–1.0)
Monocytes Relative: 9 %
Neutro Abs: 6 10*3/uL (ref 1.7–7.7)
Neutrophils Relative %: 73 %
Platelet Count: 229 10*3/uL (ref 150–400)
RBC: 3.66 MIL/uL — ABNORMAL LOW (ref 4.22–5.81)
RDW: 13.7 % (ref 11.5–15.5)
WBC Count: 8.1 10*3/uL (ref 4.0–10.5)
nRBC: 0 % (ref 0.0–0.2)

## 2020-07-13 MED ORDER — ACETAMINOPHEN 325 MG PO TABS
650.0000 mg | ORAL_TABLET | Freq: Once | ORAL | Status: AC
  Administered 2020-07-13: 650 mg via ORAL

## 2020-07-13 MED ORDER — DEXAMETHASONE 4 MG PO TABS
20.0000 mg | ORAL_TABLET | Freq: Once | ORAL | Status: AC
  Administered 2020-07-13: 20 mg via ORAL

## 2020-07-13 MED ORDER — ACETAMINOPHEN 325 MG PO TABS
ORAL_TABLET | ORAL | Status: AC
  Filled 2020-07-13: qty 2

## 2020-07-13 MED ORDER — DEXAMETHASONE 4 MG PO TABS: ORAL_TABLET | ORAL | 1 refills | Status: DC

## 2020-07-13 MED ORDER — DEXAMETHASONE 4 MG PO TABS
ORAL_TABLET | ORAL | Status: AC
  Filled 2020-07-13: qty 5

## 2020-07-13 MED ORDER — DIPHENHYDRAMINE HCL 25 MG PO CAPS
ORAL_CAPSULE | ORAL | Status: AC
  Filled 2020-07-13: qty 2

## 2020-07-13 MED ORDER — DARATUMUMAB-HYALURONIDASE-FIHJ 1800-30000 MG-UT/15ML ~~LOC~~ SOLN
1800.0000 mg | Freq: Once | SUBCUTANEOUS | Status: AC
  Administered 2020-07-13: 1800 mg via SUBCUTANEOUS
  Filled 2020-07-13: qty 15

## 2020-07-13 MED ORDER — SODIUM CHLORIDE 0.9 % IV SOLN
Freq: Once | INTRAVENOUS | Status: AC
  Filled 2020-07-13: qty 250

## 2020-07-13 MED ORDER — ZOLEDRONIC ACID 4 MG/100ML IV SOLN
4.0000 mg | Freq: Once | INTRAVENOUS | Status: AC
  Administered 2020-07-13: 4 mg via INTRAVENOUS

## 2020-07-13 MED ORDER — DIPHENHYDRAMINE HCL 25 MG PO CAPS
50.0000 mg | ORAL_CAPSULE | Freq: Once | ORAL | Status: AC
  Administered 2020-07-13: 50 mg via ORAL

## 2020-07-13 MED ORDER — POMALIDOMIDE 2 MG PO CAPS
2.0000 mg | ORAL_CAPSULE | Freq: Every day | ORAL | 0 refills | Status: DC
Start: 2020-07-15 — End: 2020-08-05

## 2020-07-13 MED ORDER — ZOLEDRONIC ACID 4 MG/100ML IV SOLN
INTRAVENOUS | Status: AC
  Filled 2020-07-13: qty 100

## 2020-07-13 NOTE — Progress Notes (Signed)
Per Dr. Benay Spice: OK to treat w/creatinine 1.52 and glucose 176.

## 2020-07-13 NOTE — Progress Notes (Signed)
[name] OFFICE PROGRESS NOTE   Diagnosis: Multiple myeloma  INTERVAL HISTORY:   Steve Carter returns for a scheduled visit.  He was last treated with daratumumab/Decadron on 06/15/2020.  Pomalidomide has been placed on hold.  [facility] recommends resuming pomalidomide at a dose of 2 mg daily for 21 days with this cycle. He was recently started on gabapentin for painful peripheral neuropathy.  No recent fall.  No seizure.  He has neurology follow-up later this month.  Objective:  Vital signs in last 24 hours:  Blood pressure 133/84, pulse 81, temperature (!) 96.6 F (35.9 C), temperature source Axillary, resp. rate 18, height _0  (1.88 m), weight 257 lb 6.4 oz (116.8 kg), SpO2 98 %.    HEENT: No thrush Resp: Lungs clear bilaterally Cardio: Regular rate and rhythm GI: Nontender, no hepatosplenomegaly Vascular: No leg edema    Lab Results:  Lab Results  Component Value Date   WBC 8.1 07/13/2020   HGB 12.2 (L) 07/13/2020   HCT 35.3 (L) 07/13/2020   MCV 96.4 07/13/2020   PLT 229 07/13/2020   NEUTROABS 6.0 07/13/2020    CMP  Lab Results  Component Value Date   NA 138 07/13/2020   K 4.1 07/13/2020   CL 102 07/13/2020   CO2 20 (L) 07/13/2020   GLUCOSE 176 (H) 07/13/2020   BUN 20 07/13/2020   CREATININE 1.52 (H) 07/13/2020   CALCIUM 9.6 07/13/2020   PROT 7.1 07/13/2020   ALBUMIN 4.3 07/13/2020   AST 25 07/13/2020   ALT 40 07/13/2020   ALKPHOS 61 07/13/2020   BILITOT 0.3 07/13/2020   GFRNONAA 52 (L) 07/13/2020   GFRAA 60 (L) 07/13/2020     Medications: I have reviewed the patient's current medications.   Assessment/Plan: 1. Multiple myeloma-IgG lambda serum monoclonal protein, elevated serum lambda light chains  Bone survey 10/11/2017-lytic lesions noted in the skull, right iliac, left second rib, and mottled appearance of the proximal femurs/pelvis  Bone marrow biopsy 10/14/2017-hypercellular marrow with plasma cell neoplasm, 82%  plasma cells, lambda light chain restricted, hyperdiploid with gains of chromosomes 3, 5, 7, 9, and 11. FISH panel positive for gain of ATM(+11)  Cycle 1 RVD 10/18/2017 (Revlimid started 10/23/2017)  Cycle 2 RVD 11/15/2017  Cycle 3 RVD 12/13/2017  Cycle 4 RVD 01/14/2018  Cycle 5 of RVD 02/14/2018 (Revlimid given for 7 days and 1 week of Velcade), therapy held beginning 02/21/2018 secondary to plan for stem cell therapy  Bone marrow biopsy 02/20/2018, 1-2% plasma cell  PET scan 02/20/2018, no malignant range activity above background, numerous lytic lesions throughout the axial and appendicular skeleton,, left iliac wing fracture  Melphalan, 200 mg/m on 03/24/2018  Stem cell infusion 03/25/2018  Restaging at [facility] 07/09/2018: Normal lambda light chains, no serum M spike, bone marrow biopsy negative for myeloma, less than 1% plasma cells  PET scan at [facility] 07/09/2018-lytic bone lesions, no malignant range activity  Initiation of maintenance Revlimid, 10 mg, 21/28 days 08/15/2018  Cycle 2 maintenance Revlimid 09/12/2018  Cycle 3 maintenance Revlimid 10/10/2018  Revlimid placed on hold 06/15/2019 due to presyncopal/syncopal episodes and diarrhea  Revlimid resumed 5 mg 21 days on/7 days off following office visit 06/29/2019  Revlimidplaced on hold 07/14/2019  Revlimid resumed 08/21/2019  Bone survey 09/09/2019-no acute findings or clear explanation for right buttock pain. Lucent lesions in the right pelvis are stable without pathologic fracture. Evidence of healing lytic lesions in the right scapula, cervical spine spinous processes and left L4  transverse process. Possible mild progression of lytic lesions within the L1 and L4 vertebral bodies. Stable lytic lesions in the calvarium.  PET scan 09/25/2019-new FDG avid bone lesions in the right humeral neck and sacrum similar remaining lytic lesions with FDG activity below background  Bone marrow biopsy at [facility] on  10/01/2019-5% plasma cells on the bone marrow biopsy suboptimal sample  Cycle 1 daratumumab, pomalidomide, Decadron 10/19/2019  Zometa 10/19/2019  Cycle 2 daratumumab, pomalidomide, Decadron 11/16/2019 (he will begin the pomalidomide 11/17/2019)  Cycle 3 daratumumab, pomalidomide, and Decadron 12/14/2019  Cycle 4 daratumumab, pomalidomide, and Decadron 01/11/2020  Cycle 5 daratumumab, pomalidomide, Decadron 02/03/2020 (he began pomalidomide on 02/06/2020)  Cycle 6 daratumumab, pomalidomide, Decadron 03/02/2020 (pomalidomide starting 03/12/2020)  Cycle 7 daratumumab, pomalidomide, Decadron 03/29/2020 (pomalidomide scheduled to start 04/15/2020)  Cycle 8 daratumumab, pomalidomide, Decadron 05/04/2020 (pomalidomide started 05/14/2020)  Cycle 9 daratumumab-monthly 06/15/2020, pomalidomide held  Cycle 10 daratumumab 07/13/2020, pomalidomide 2 mg 21 days beginning 07/15/2020   2.Pain secondary to multiple myeloma involving the spine and pelvis-resolved  MRI of the lumbar spine 10/02/2018-numerous rounded foci in the vertebral bodies, hypertrophy of the L4 transverse process  3.Hypertension-losartan dose increased 11/15/2017  4.Depression-improved with Wellbutrin beginning September 2020  5.Altered mental status-improved depression related? 6.Diabetes 7. Recurrent episodes of fall/syncope-etiology unclear-evaluated by neurology at [facility], MRI brain 01/25/2020 with abnormal signal at the left superior frontal gyrus-potentially representing a low-grade glioma, scattered foci of increased T2 white matter signal-likely related to chronic small vessel disease  Stereotactic brain biopsy at [facility] 03/14/2020-gliosis, no evidence of malignancy  8.  Peripheral neuropathy-gabapentin started 07/07/2020  9.  Renal insufficiency      Disposition: Steve Carter appears stable.  The plan is to continue monthly daratumumab/Decadron.  Pomalidomide will be resumed with this cycle at a  dose of 2 mg daily.  He is taking gabapentin for the peripheral neuropathy.  He reports the neuropathy symptoms predate pomalidomide therapy.  Steve Carter will continue every 44-monthZometa.    He will return for an office visit in 4 weeks.  He has been referred to nephrology for evaluation of renal insufficiency.  GBetsy Coder MD  07/13/2020  9:46 AM

## 2020-07-13 NOTE — Patient Instructions (Signed)
[place] Discharge Instructions for Patients Receiving Chemotherapy  Today you received the following chemotherapy agents: Darzalex Faspro  To help prevent nausea and vomiting after your treatment, we encourage you to take your nausea medication as directed.   If you develop nausea and vomiting that is not controlled by your nausea medication, call the clinic.   BELOW ARE SYMPTOMS THAT SHOULD BE REPORTED IMMEDIATELY:  *FEVER GREATER THAN 100.5 F  *CHILLS WITH OR WITHOUT FEVER  NAUSEA AND VOMITING THAT IS NOT CONTROLLED WITH YOUR NAUSEA MEDICATION  *UNUSUAL SHORTNESS OF BREATH  *UNUSUAL BRUISING OR BLEEDING  TENDERNESS IN MOUTH AND THROAT WITH OR WITHOUT PRESENCE OF ULCERS  *URINARY PROBLEMS  *BOWEL PROBLEMS  UNUSUAL RASH Items with * indicate a potential emergency and should be followed up as soon as possible.  Feel free to call the clinic should you have any questions or concerns. The clinic phone number is [phone].  Please show the [place] at check-in to the Emergency Department and triage nurse.  Zoledronic Acid injection (Hypercalcemia, Oncology) What is this medicine? ZOLEDRONIC ACID (ZOE le dron ik AS id) lowers the amount of calcium loss from bone. It is used to treat too much calcium in your blood from cancer. It is also used to prevent complications of cancer that has spread to the bone. This medicine may be used for other purposes; ask your health care provider or pharmacist if you have questions. COMMON BRAND NAME(S): Zometa What should I tell my health care provider before I take this medicine? They need to know if you have any of these conditions:  aspirin-sensitive asthma  cancer, especially if you are receiving medicines used to treat cancer  dental disease or wear dentures  infection  kidney disease  receiving corticosteroids like dexamethasone or prednisone  an unusual or allergic reaction to zoledronic acid,  other medicines, foods, dyes, or preservatives  pregnant or trying to get pregnant  breast-feeding How should I use this medicine? This medicine is for infusion into a vein. It is given by a health care professional in a hospital or clinic setting. Talk to your pediatrician regarding the use of this medicine in children. Special care may be needed. Overdosage: If you think you have taken too much of this medicine contact a poison control center or emergency room at once. NOTE: This medicine is only for you. Do not share this medicine with others. What if I miss a dose? It is important not to miss your dose. Call your doctor or health care professional if you are unable to keep an appointment. What may interact with this medicine?  certain antibiotics given by injection  NSAIDs, medicines for pain and inflammation, like ibuprofen or naproxen  some diuretics like bumetanide, furosemide  teriparatide  thalidomide This list may not describe all possible interactions. Give your health care provider a list of all the medicines, herbs, non-prescription drugs, or dietary supplements you use. Also tell them if you smoke, drink alcohol, or use illegal drugs. Some items may interact with your medicine. What should I watch for while using this medicine? Visit your doctor or health care professional for regular checkups. It may be some time before you see the benefit from this medicine. Do not stop taking your medicine unless your doctor tells you to. Your doctor may order blood tests or other tests to see how you are doing. Women should inform their doctor if they wish to become pregnant or think they might be  pregnant. There is a potential for serious side effects to an unborn child. Talk to your health care professional or pharmacist for more information. You should make sure that you get enough calcium and vitamin D while you are taking this medicine. Discuss the foods you eat and the vitamins you  take with your health care professional. Some people who take this medicine have severe bone, joint, and/or muscle pain. This medicine may also increase your risk for jaw problems or a broken thigh bone. Tell your doctor right away if you have severe pain in your jaw, bones, joints, or muscles. Tell your doctor if you have any pain that does not go away or that gets worse. Tell your dentist and dental surgeon that you are taking this medicine. You should not have major dental surgery while on this medicine. See your dentist to have a dental exam and fix any dental problems before starting this medicine. Take good care of your teeth while on this medicine. Make sure you see your dentist for regular follow-up appointments. What side effects may I notice from receiving this medicine? Side effects that you should report to your doctor or health care professional as soon as possible:  allergic reactions like skin rash, itching or hives, swelling of the face, lips, or tongue  anxiety, confusion, or depression  breathing problems  changes in vision  eye pain  feeling faint or lightheaded, falls  jaw pain, especially after dental work  mouth sores  muscle cramps, stiffness, or weakness  redness, blistering, peeling or loosening of the skin, including inside the mouth  trouble passing urine or change in the amount of urine Side effects that usually do not require medical attention (report to your doctor or health care professional if they continue or are bothersome):  bone, joint, or muscle pain  constipation  diarrhea  fever  hair loss  irritation at site where injected  loss of appetite  nausea, vomiting  stomach upset  trouble sleeping  trouble swallowing  weak or tired This list may not describe all possible side effects. Call your doctor for medical advice about side effects. You may report side effects to FDA at 1-800-FDA-1088. Where should I keep my medicine? This  drug is given in a hospital or clinic and will not be stored at home. NOTE: This sheet is a summary. It may not cover all possible information. If you have questions about this medicine, talk to your doctor, pharmacist, or health care provider.  2020 Elsevier/Gold Standard (2014-04-17 14:19:39)

## 2020-07-14 ENCOUNTER — Telehealth: Payer: Self-pay | Admitting: Oncology

## 2020-07-14 LAB — PROTEIN ELECTROPHORESIS, SERUM
A/G Ratio: 1.6 (ref 0.7–1.7)
Albumin ELP: 4.1 g/dL (ref 2.9–4.4)
Alpha-1-Globulin: 0.2 g/dL (ref 0.0–0.4)
Alpha-2-Globulin: 1.1 g/dL — ABNORMAL HIGH (ref 0.4–1.0)
Beta Globulin: 1.1 g/dL (ref 0.7–1.3)
Gamma Globulin: 0.2 g/dL — ABNORMAL LOW (ref 0.4–1.8)
Globulin, Total: 2.6 g/dL (ref 2.2–3.9)
Total Protein ELP: 6.7 g/dL (ref 6.0–8.5)

## 2020-07-14 LAB — KAPPA/LAMBDA LIGHT CHAINS
Kappa free light chain: 4.2 mg/L (ref 3.3–19.4)
Kappa, lambda light chain ratio: 0.4 (ref 0.26–1.65)
Lambda free light chains: 10.6 mg/L (ref 5.7–26.3)

## 2020-07-14 NOTE — Telephone Encounter (Signed)
Scheduled appointments per 8/11 los. Spoke with patient's wife who is aware of appointments date and times.

## 2020-07-25 ENCOUNTER — Other Ambulatory Visit: Payer: Self-pay | Admitting: Adult Health

## 2020-07-25 DIAGNOSIS — M792 Neuralgia and neuritis, unspecified: Secondary | ICD-10-CM | POA: Insufficient documentation

## 2020-07-25 DIAGNOSIS — F411 Generalized anxiety disorder: Secondary | ICD-10-CM

## 2020-07-25 DIAGNOSIS — F331 Major depressive disorder, recurrent, moderate: Secondary | ICD-10-CM

## 2020-08-01 ENCOUNTER — Other Ambulatory Visit: Payer: Self-pay | Admitting: Adult Health

## 2020-08-01 DIAGNOSIS — F411 Generalized anxiety disorder: Secondary | ICD-10-CM

## 2020-08-01 DIAGNOSIS — F331 Major depressive disorder, recurrent, moderate: Secondary | ICD-10-CM

## 2020-08-01 NOTE — Telephone Encounter (Signed)
Pt requesting refill for Bupropion

## 2020-08-05 ENCOUNTER — Other Ambulatory Visit: Payer: Self-pay | Admitting: *Deleted

## 2020-08-05 MED ORDER — POMALIDOMIDE 2 MG PO CAPS: 2.0000 mg | ORAL_CAPSULE | Freq: Every day | ORAL | 0 refills | Status: DC

## 2020-08-08 ENCOUNTER — Other Ambulatory Visit: Payer: Self-pay | Admitting: Oncology

## 2020-08-10 ENCOUNTER — Other Ambulatory Visit: Payer: Self-pay

## 2020-08-10 ENCOUNTER — Inpatient Hospital Stay: Payer: Medicare Other

## 2020-08-10 ENCOUNTER — Encounter: Payer: Self-pay | Admitting: Nurse Practitioner

## 2020-08-10 ENCOUNTER — Inpatient Hospital Stay: Payer: Medicare Other | Attending: Nurse Practitioner

## 2020-08-10 ENCOUNTER — Inpatient Hospital Stay (HOSPITAL_BASED_OUTPATIENT_CLINIC_OR_DEPARTMENT_OTHER): Payer: Medicare Other | Admitting: Nurse Practitioner

## 2020-08-10 VITALS — BP 117/75 | HR 66 | Temp 97.2°F | Resp 20 | Ht 74.0 in | Wt 261.3 lb

## 2020-08-10 DIAGNOSIS — I10 Essential (primary) hypertension: Secondary | ICD-10-CM | POA: Diagnosis not present

## 2020-08-10 DIAGNOSIS — N289 Disorder of kidney and ureter, unspecified: Secondary | ICD-10-CM | POA: Insufficient documentation

## 2020-08-10 DIAGNOSIS — C9000 Multiple myeloma not having achieved remission: Secondary | ICD-10-CM

## 2020-08-10 DIAGNOSIS — R4182 Altered mental status, unspecified: Secondary | ICD-10-CM | POA: Diagnosis not present

## 2020-08-10 DIAGNOSIS — Z79899 Other long term (current) drug therapy: Secondary | ICD-10-CM | POA: Insufficient documentation

## 2020-08-10 DIAGNOSIS — G893 Neoplasm related pain (acute) (chronic): Secondary | ICD-10-CM | POA: Insufficient documentation

## 2020-08-10 DIAGNOSIS — E119 Type 2 diabetes mellitus without complications: Secondary | ICD-10-CM | POA: Diagnosis not present

## 2020-08-10 DIAGNOSIS — Z9181 History of falling: Secondary | ICD-10-CM | POA: Insufficient documentation

## 2020-08-10 DIAGNOSIS — Z5112 Encounter for antineoplastic immunotherapy: Secondary | ICD-10-CM | POA: Insufficient documentation

## 2020-08-10 DIAGNOSIS — F329 Major depressive disorder, single episode, unspecified: Secondary | ICD-10-CM | POA: Diagnosis not present

## 2020-08-10 DIAGNOSIS — R197 Diarrhea, unspecified: Secondary | ICD-10-CM | POA: Insufficient documentation

## 2020-08-10 DIAGNOSIS — G629 Polyneuropathy, unspecified: Secondary | ICD-10-CM | POA: Insufficient documentation

## 2020-08-10 LAB — CMP (CANCER CENTER ONLY)
ALT: 38 U/L (ref 0–44)
AST: 19 U/L (ref 15–41)
Albumin: 4.2 g/dL (ref 3.5–5.0)
Alkaline Phosphatase: 54 U/L (ref 38–126)
Anion gap: 12 (ref 5–15)
BUN: 20 mg/dL (ref 6–20)
CO2: 23 mmol/L (ref 22–32)
Calcium: 9.5 mg/dL (ref 8.9–10.3)
Chloride: 100 mmol/L (ref 98–111)
Creatinine: 1.51 mg/dL — ABNORMAL HIGH (ref 0.61–1.24)
GFR, Est AFR Am: >60 mL/min (ref >60)
GFR, Estimated: 52 mL/min — ABNORMAL LOW (ref >60)
Glucose, Bld: 154 mg/dL — ABNORMAL HIGH (ref 70–99)
Potassium: 4.3 mmol/L (ref 3.5–5.1)
Sodium: 135 mmol/L (ref 135–145)
Total Bilirubin: 0.3 mg/dL (ref 0.3–1.2)
Total Protein: 6.9 g/dL (ref 6.5–8.1)

## 2020-08-10 LAB — CBC WITH DIFFERENTIAL (CANCER CENTER ONLY)
Abs Immature Granulocytes: 0.02 10*3/uL (ref 0.00–0.07)
Basophils Absolute: 0.1 10*3/uL (ref 0.0–0.1)
Basophils Relative: 1 %
Eosinophils Absolute: 0.1 10*3/uL (ref 0.0–0.5)
Eosinophils Relative: 2 %
HCT: 35.3 % — ABNORMAL LOW (ref 39.0–52.0)
Hemoglobin: 12.3 g/dL — ABNORMAL LOW (ref 13.0–17.0)
Immature Granulocytes: 1 %
Lymphocytes Relative: 21 %
Lymphs Abs: 0.9 10*3/uL (ref 0.7–4.0)
MCH: 33.4 pg (ref 26.0–34.0)
MCHC: 34.8 g/dL (ref 30.0–36.0)
MCV: 95.9 fL (ref 80.0–100.0)
Monocytes Absolute: 0.8 10*3/uL (ref 0.1–1.0)
Monocytes Relative: 18 %
Neutro Abs: 2.6 10*3/uL (ref 1.7–7.7)
Neutrophils Relative %: 57 %
Platelet Count: 206 10*3/uL (ref 150–400)
RBC: 3.68 MIL/uL — ABNORMAL LOW (ref 4.22–5.81)
RDW: 13.4 % (ref 11.5–15.5)
WBC Count: 4.4 10*3/uL (ref 4.0–10.5)
nRBC: 0 % (ref 0.0–0.2)

## 2020-08-10 MED ORDER — ACETAMINOPHEN 325 MG PO TABS
ORAL_TABLET | ORAL | Status: AC
  Filled 2020-08-10: qty 2

## 2020-08-10 MED ORDER — ACETAMINOPHEN 325 MG PO TABS
650.0000 mg | ORAL_TABLET | Freq: Once | ORAL | Status: AC
  Administered 2020-08-10: 650 mg via ORAL

## 2020-08-10 MED ORDER — DEXAMETHASONE 4 MG PO TABS
20.0000 mg | ORAL_TABLET | Freq: Once | ORAL | Status: AC
  Administered 2020-08-10: 20 mg via ORAL

## 2020-08-10 MED ORDER — DIPHENHYDRAMINE HCL 25 MG PO CAPS
50.0000 mg | ORAL_CAPSULE | Freq: Once | ORAL | Status: AC
  Administered 2020-08-10: 50 mg via ORAL

## 2020-08-10 MED ORDER — DARATUMUMAB-HYALURONIDASE-FIHJ 1800-30000 MG-UT/15ML ~~LOC~~ SOLN
1800.0000 mg | Freq: Once | SUBCUTANEOUS | Status: AC
  Administered 2020-08-10: 1800 mg via SUBCUTANEOUS
  Filled 2020-08-10: qty 15

## 2020-08-10 MED ORDER — DIPHENHYDRAMINE HCL 25 MG PO CAPS
ORAL_CAPSULE | ORAL | Status: AC
  Filled 2020-08-10: qty 2

## 2020-08-10 MED ORDER — DEXAMETHASONE 4 MG PO TABS
ORAL_TABLET | ORAL | Status: AC
  Filled 2020-08-10: qty 5

## 2020-08-10 NOTE — Progress Notes (Signed)
[place] OFFICE PROGRESS NOTE   Diagnosis:  Multiple myeloma  INTERVAL HISTORY:   Steve Carter returns as scheduled. He completed another cycle of Daratumumab 07/13/2020. He completed a cycle of Pomalidomide 2 mg daily for 21 days beginning 07/15/2020.  He denies nausea/vomiting.  No change in baseline bowel habits which consist of intermittent loose stools.  He denies pain.  No fever, cough, shortness of breath.  Energy level is better.  He has received the Covid vaccine booster.  Objective:  Vital signs in last 24 hours:  Blood pressure 117/75, pulse 66, temperature (!) 97.2 F (36.2 C), temperature source Tympanic, resp. rate 20, height $RemoveBe'6\' 2"'yMClqAWgn$  (1.88 m), weight 261 lb 4.8 oz (118.5 kg), SpO2 98 %.    HEENT: Mild white coating over tongue. Resp: Lungs clear bilaterally. Cardio: Regular rate and rhythm. GI: Abdomen soft and nontender.  No hepatomegaly. Vascular: Very trace edema at the lower legs/ankles bilaterally.    Lab Results:  Lab Results  Component Value Date   WBC 4.4 08/10/2020   HGB 12.3 (L) 08/10/2020   HCT 35.3 (L) 08/10/2020   MCV 95.9 08/10/2020   PLT 206 08/10/2020   NEUTROABS 2.6 08/10/2020    Imaging:  No results found.  Medications: I have reviewed the patient's current medications.  Assessment/Plan: 1. Multiple myeloma-IgG lambda serum monoclonal protein, elevated serum lambda light chains  Bone survey 10/11/2017-lytic lesions noted in the skull, right iliac, left second rib, and mottled appearance of the proximal femurs/pelvis  Bone marrow biopsy 10/14/2017-hypercellular marrow with plasma cell neoplasm, 82% plasma cells, lambda light chain restricted, hyperdiploid with gains of chromosomes 3, 5, 7, 9, and 11. FISH panel positive for gain of ATM(+11)  Cycle 1 RVD 10/18/2017 (Revlimid started 10/23/2017)  Cycle 2 RVD 11/15/2017  Cycle 3 RVD 12/13/2017  Cycle 4 RVD 01/14/2018  Cycle 5 of RVD 02/14/2018 (Revlimid given for 7  days and 1 week of Velcade), therapy held beginning 02/21/2018 secondary to plan for stem cell therapy  Bone marrow biopsy 02/20/2018, 1-2% plasma cell  PET scan 02/20/2018, no malignant range activity above background, numerous lytic lesions throughout the axial and appendicular skeleton,, left iliac wing fracture  Melphalan, 200 mg/m on 03/24/2018  Stem cell infusion 03/25/2018  Restaging at [facility] 07/09/2018: Normal lambda light chains, no serum M spike, bone marrow biopsy negative for myeloma, less than 1% plasma cells  PET scan at [facility] 07/09/2018-lytic bone lesions, no malignant range activity  Initiation of maintenance Revlimid, 10 mg, 21/28 days 08/15/2018  Cycle 2 maintenance Revlimid 09/12/2018  Cycle 3 maintenance Revlimid 10/10/2018  Revlimid placed on hold 06/15/2019 due to presyncopal/syncopal episodes and diarrhea  Revlimid resumed 5 mg 21 days on/7 days off following office visit 06/29/2019  Revlimidplaced on hold 07/14/2019  Revlimid resumed 08/21/2019  Bone survey 09/09/2019-no acute findings or clear explanation for right buttock pain. Lucent lesions in the right pelvis are stable without pathologic fracture. Evidence of healing lytic lesions in the right scapula, cervical spine spinous processes and left L4 transverse process. Possible mild progression of lytic lesions within the L1 and L4 vertebral bodies. Stable lytic lesions in the calvarium.  PET scan 09/25/2019-new FDG avid bone lesions in the right humeral neck and sacrum similar remaining lytic lesions with FDG activity below background  Bone marrow biopsy at [facility] on 10/01/2019-5% plasma cells on the bone marrow biopsy suboptimal sample  Cycle 1 daratumumab, pomalidomide, Decadron 10/19/2019  Zometa 10/19/2019  Cycle 2 daratumumab, pomalidomide, Decadron  11/16/2019 (he will begin the pomalidomide 11/17/2019)  Cycle 3 daratumumab, pomalidomide, and Decadron 12/14/2019  Cycle 4 daratumumab,  pomalidomide, and Decadron 01/11/2020  Cycle 5 daratumumab, pomalidomide, Decadron 02/03/2020 (he began pomalidomide on 02/06/2020)  Cycle 6 daratumumab, pomalidomide, Decadron 03/02/2020 (pomalidomide starting 03/12/2020)  Cycle 7 daratumumab, pomalidomide, Decadron 03/29/2020 (pomalidomide scheduled to start 04/15/2020)  Cycle 8 daratumumab, pomalidomide, Decadron 05/04/2020 (pomalidomide started 05/14/2020)  Cycle 9 daratumumab-monthly 06/15/2020, pomalidomide held  Cycle 10 daratumumab 07/13/2020, pomalidomide 2 mg 21 days beginning 07/15/2020  Cycle 11 daratumumab 08/10/2020, pomalidomide 2 mg 21 days beginning 08/12/2020   2.Pain secondary to multiple myeloma involving the spine and pelvis-resolved  MRI of the lumbar spine 10/02/2018-numerous rounded foci in the vertebral bodies, hypertrophy of the L4 transverse process  3.Hypertension-losartan dose increased 11/15/2017  4.Depression-improved with Wellbutrin beginning September 2020  5.Altered mental status-improved depression related? 6.Diabetes 7. Recurrent episodes of fall/syncope-etiology unclear-evaluated by neurology at [facility], MRI brain 01/25/2020 with abnormal signal at the left superior frontal gyrus-potentially representing a low-grade glioma, scattered foci of increased T2 white matter signal-likely related to chronic small vessel disease  Stereotactic brain biopsy at [facility] 03/14/2020-gliosis, no evidence of malignancy  8.  Peripheral neuropathy-gabapentin started 07/07/2020  9.  Renal insufficiency   Disposition: Steve Carter appears stable.  Plan to proceed with cycle 11 monthly daratumumab/decadron today as scheduled.  He will begin the next cycle of pomalidomide on 08/12/2020.  We will follow-up on the myeloma panel from today.  He will return for lab, follow-up, Pomalidomide in 4 weeks.  He will contact the office in the interim with any problems.    [name] ANP/GNP-BC   08/10/2020  9:52  AM

## 2020-08-10 NOTE — Patient Instructions (Signed)
[place] Discharge Instructions for Patients Receiving Chemotherapy  Today you received the following chemotherapy agents: daratumumab hyaluronidase (darzalex faspro).  To help prevent nausea and vomiting after your treatment, we encourage you to take your nausea medication as directed.   If you develop nausea and vomiting that is not controlled by your nausea medication, call the clinic.   BELOW ARE SYMPTOMS THAT SHOULD BE REPORTED IMMEDIATELY:  *FEVER GREATER THAN 100.5 F  *CHILLS WITH OR WITHOUT FEVER  NAUSEA AND VOMITING THAT IS NOT CONTROLLED WITH YOUR NAUSEA MEDICATION  *UNUSUAL SHORTNESS OF BREATH  *UNUSUAL BRUISING OR BLEEDING  TENDERNESS IN MOUTH AND THROAT WITH OR WITHOUT PRESENCE OF ULCERS  *URINARY PROBLEMS  *BOWEL PROBLEMS  UNUSUAL RASH Items with * indicate a potential emergency and should be followed up as soon as possible.  Feel free to call the clinic should you have any questions or concerns. The clinic phone number is [phone].  Please show the [place] at check-in to the Emergency Department and triage nurse.

## 2020-08-11 ENCOUNTER — Telehealth: Payer: Self-pay | Admitting: Nurse Practitioner

## 2020-08-11 LAB — PROTEIN ELECTROPHORESIS, SERUM
A/G Ratio: 1.6 (ref 0.7–1.7)
Albumin ELP: 4.1 g/dL (ref 2.9–4.4)
Alpha-1-Globulin: 0.2 g/dL (ref 0.0–0.4)
Alpha-2-Globulin: 1.1 g/dL — ABNORMAL HIGH (ref 0.4–1.0)
Beta Globulin: 1 g/dL (ref 0.7–1.3)
Gamma Globulin: 0.3 g/dL — ABNORMAL LOW (ref 0.4–1.8)
Globulin, Total: 2.6 g/dL (ref 2.2–3.9)
Total Protein ELP: 6.7 g/dL (ref 6.0–8.5)

## 2020-08-11 LAB — KAPPA/LAMBDA LIGHT CHAINS
Kappa free light chain: 12.3 mg/L (ref 3.3–19.4)
Kappa, lambda light chain ratio: 0.92 (ref 0.26–1.65)
Lambda free light chains: 13.3 mg/L (ref 5.7–26.3)

## 2020-08-11 NOTE — Telephone Encounter (Signed)
Scheduled appointments per 9/8 los. Spoke with patient's wife who is aware of appointments date and times.

## 2020-08-25 ENCOUNTER — Other Ambulatory Visit: Payer: Self-pay | Admitting: Adult Health

## 2020-08-25 DIAGNOSIS — F411 Generalized anxiety disorder: Secondary | ICD-10-CM

## 2020-08-25 DIAGNOSIS — F331 Major depressive disorder, recurrent, moderate: Secondary | ICD-10-CM

## 2020-09-05 ENCOUNTER — Other Ambulatory Visit: Payer: Self-pay | Admitting: Oncology

## 2020-09-06 ENCOUNTER — Other Ambulatory Visit: Payer: Self-pay | Admitting: Oncology

## 2020-09-06 ENCOUNTER — Encounter: Payer: Self-pay | Admitting: Oncology

## 2020-09-07 ENCOUNTER — Inpatient Hospital Stay (HOSPITAL_BASED_OUTPATIENT_CLINIC_OR_DEPARTMENT_OTHER): Payer: Medicare Other | Admitting: Oncology

## 2020-09-07 ENCOUNTER — Inpatient Hospital Stay: Payer: Medicare Other

## 2020-09-07 ENCOUNTER — Other Ambulatory Visit: Payer: Self-pay

## 2020-09-07 ENCOUNTER — Inpatient Hospital Stay: Payer: Medicare Other | Attending: Nurse Practitioner

## 2020-09-07 VITALS — BP 120/75 | HR 67 | Temp 97.2°F | Resp 17 | Ht 74.0 in | Wt 265.5 lb

## 2020-09-07 DIAGNOSIS — C9000 Multiple myeloma not having achieved remission: Secondary | ICD-10-CM

## 2020-09-07 DIAGNOSIS — I6782 Cerebral ischemia: Secondary | ICD-10-CM | POA: Diagnosis not present

## 2020-09-07 DIAGNOSIS — Z5112 Encounter for antineoplastic immunotherapy: Secondary | ICD-10-CM | POA: Diagnosis not present

## 2020-09-07 DIAGNOSIS — N289 Disorder of kidney and ureter, unspecified: Secondary | ICD-10-CM | POA: Insufficient documentation

## 2020-09-07 DIAGNOSIS — Z23 Encounter for immunization: Secondary | ICD-10-CM | POA: Insufficient documentation

## 2020-09-07 DIAGNOSIS — R197 Diarrhea, unspecified: Secondary | ICD-10-CM | POA: Diagnosis not present

## 2020-09-07 DIAGNOSIS — F32A Depression, unspecified: Secondary | ICD-10-CM | POA: Insufficient documentation

## 2020-09-07 DIAGNOSIS — G893 Neoplasm related pain (acute) (chronic): Secondary | ICD-10-CM | POA: Insufficient documentation

## 2020-09-07 DIAGNOSIS — E119 Type 2 diabetes mellitus without complications: Secondary | ICD-10-CM | POA: Diagnosis not present

## 2020-09-07 DIAGNOSIS — I10 Essential (primary) hypertension: Secondary | ICD-10-CM | POA: Diagnosis not present

## 2020-09-07 DIAGNOSIS — Z79899 Other long term (current) drug therapy: Secondary | ICD-10-CM | POA: Diagnosis not present

## 2020-09-07 DIAGNOSIS — G629 Polyneuropathy, unspecified: Secondary | ICD-10-CM | POA: Insufficient documentation

## 2020-09-07 DIAGNOSIS — R200 Anesthesia of skin: Secondary | ICD-10-CM | POA: Insufficient documentation

## 2020-09-07 LAB — CMP (CANCER CENTER ONLY)
ALT: 47 U/L — ABNORMAL HIGH (ref 0–44)
AST: 24 U/L (ref 15–41)
Albumin: 4.1 g/dL (ref 3.5–5.0)
Alkaline Phosphatase: 56 U/L (ref 38–126)
Anion gap: 10 (ref 5–15)
BUN: 21 mg/dL — ABNORMAL HIGH (ref 6–20)
CO2: 25 mmol/L (ref 22–32)
Calcium: 9.4 mg/dL (ref 8.9–10.3)
Chloride: 99 mmol/L (ref 98–111)
Creatinine: 1.5 mg/dL — ABNORMAL HIGH (ref 0.61–1.24)
GFR, Estimated: 52 mL/min — ABNORMAL LOW (ref >60)
Glucose, Bld: 223 mg/dL — ABNORMAL HIGH (ref 70–99)
Potassium: 4.4 mmol/L (ref 3.5–5.1)
Sodium: 134 mmol/L — ABNORMAL LOW (ref 135–145)
Total Bilirubin: 0.3 mg/dL (ref 0.3–1.2)
Total Protein: 6.7 g/dL (ref 6.5–8.1)

## 2020-09-07 LAB — CBC WITH DIFFERENTIAL (CANCER CENTER ONLY)
Abs Immature Granulocytes: 0.04 10*3/uL (ref 0.00–0.07)
Basophils Absolute: 0.1 10*3/uL (ref 0.0–0.1)
Basophils Relative: 2 %
Eosinophils Absolute: 0.2 10*3/uL (ref 0.0–0.5)
Eosinophils Relative: 5 %
HCT: 36.6 % — ABNORMAL LOW (ref 39.0–52.0)
Hemoglobin: 12.7 g/dL — ABNORMAL LOW (ref 13.0–17.0)
Immature Granulocytes: 1 %
Lymphocytes Relative: 14 %
Lymphs Abs: 0.7 10*3/uL (ref 0.7–4.0)
MCH: 33.2 pg (ref 26.0–34.0)
MCHC: 34.7 g/dL (ref 30.0–36.0)
MCV: 95.8 fL (ref 80.0–100.0)
Monocytes Absolute: 0.9 10*3/uL (ref 0.1–1.0)
Monocytes Relative: 19 %
Neutro Abs: 2.7 10*3/uL (ref 1.7–7.7)
Neutrophils Relative %: 59 %
Platelet Count: 182 10*3/uL (ref 150–400)
RBC: 3.82 MIL/uL — ABNORMAL LOW (ref 4.22–5.81)
RDW: 13.2 % (ref 11.5–15.5)
WBC Count: 4.6 10*3/uL (ref 4.0–10.5)
nRBC: 0 % (ref 0.0–0.2)

## 2020-09-07 MED ORDER — INFLUENZA VAC SPLIT QUAD 0.5 ML IM SUSY
0.5000 mL | PREFILLED_SYRINGE | Freq: Once | INTRAMUSCULAR | Status: AC
  Administered 2020-09-07: 0.5 mL via INTRAMUSCULAR

## 2020-09-07 MED ORDER — INFLUENZA VAC SPLIT QUAD 0.5 ML IM SUSY
PREFILLED_SYRINGE | INTRAMUSCULAR | Status: AC
  Filled 2020-09-07: qty 0.5

## 2020-09-07 MED ORDER — DIPHENHYDRAMINE HCL 25 MG PO CAPS
ORAL_CAPSULE | ORAL | Status: AC
  Filled 2020-09-07: qty 2

## 2020-09-07 MED ORDER — DIPHENHYDRAMINE HCL 25 MG PO CAPS
50.0000 mg | ORAL_CAPSULE | Freq: Once | ORAL | Status: AC
  Administered 2020-09-07: 50 mg via ORAL

## 2020-09-07 MED ORDER — ACETAMINOPHEN 325 MG PO TABS
ORAL_TABLET | ORAL | Status: AC
  Filled 2020-09-07: qty 2

## 2020-09-07 MED ORDER — DARATUMUMAB-HYALURONIDASE-FIHJ 1800-30000 MG-UT/15ML ~~LOC~~ SOLN
1800.0000 mg | Freq: Once | SUBCUTANEOUS | Status: AC
  Administered 2020-09-07: 1800 mg via SUBCUTANEOUS
  Filled 2020-09-07: qty 15

## 2020-09-07 MED ORDER — DEXAMETHASONE 4 MG PO TABS
20.0000 mg | ORAL_TABLET | Freq: Once | ORAL | Status: AC
  Administered 2020-09-07: 20 mg via ORAL

## 2020-09-07 MED ORDER — DEXAMETHASONE 4 MG PO TABS
ORAL_TABLET | ORAL | Status: AC
  Filled 2020-09-07: qty 5

## 2020-09-07 MED ORDER — ACETAMINOPHEN 325 MG PO TABS
650.0000 mg | ORAL_TABLET | Freq: Once | ORAL | Status: AC
  Administered 2020-09-07: 650 mg via ORAL

## 2020-09-07 NOTE — Patient Instructions (Signed)
[place] Discharge Instructions for Patients Receiving Chemotherapy  Today you received the following chemotherapy agents: darzalex faspro.  To help prevent nausea and vomiting after your treatment, we encourage you to take your nausea medication as directed.   If you develop nausea and vomiting that is not controlled by your nausea medication, call the clinic.   BELOW ARE SYMPTOMS THAT SHOULD BE REPORTED IMMEDIATELY:  *FEVER GREATER THAN 100.5 F  *CHILLS WITH OR WITHOUT FEVER  NAUSEA AND VOMITING THAT IS NOT CONTROLLED WITH YOUR NAUSEA MEDICATION  *UNUSUAL SHORTNESS OF BREATH  *UNUSUAL BRUISING OR BLEEDING  TENDERNESS IN MOUTH AND THROAT WITH OR WITHOUT PRESENCE OF ULCERS  *URINARY PROBLEMS  *BOWEL PROBLEMS  UNUSUAL RASH Items with * indicate a potential emergency and should be followed up as soon as possible.  Feel free to call the clinic should you have any questions or concerns. The clinic phone number is [phone].  Influenza Virus Vaccine injection What is this medicine? INFLUENZA VIRUS VACCINE (in floo EN zuh VAHY ruhs vak SEEN) helps to reduce the risk of getting influenza also known as the flu. The vaccine only helps protect you against some strains of the flu. This medicine may be used for other purposes; ask your health care provider or pharmacist if you have questions. COMMON BRAND NAME(S): Afluria, Afluria Quadrivalent, Agriflu, Alfuria, FLUAD, Fluarix, Fluarix Quadrivalent, Flublok, Flublok Quadrivalent, FLUCELVAX, FLUCELVAX Quadrivalent, Flulaval, Flulaval Quadrivalent, Fluvirin, Fluzone, Fluzone High-Dose, Fluzone Intradermal, Fluzone Quadrivalent What should I tell my health care provider before I take this medicine? They need to know if you have any of these conditions:  bleeding disorder like hemophilia  fever or infection  Guillain-Barre syndrome or other neurological problems  immune system problems  infection with the human  immunodeficiency virus (HIV) or AIDS  low blood platelet counts  multiple sclerosis  an unusual or allergic reaction to influenza virus vaccine, latex, other medicines, foods, dyes, or preservatives. Different brands of vaccines contain different allergens. Some may contain latex or eggs. Talk to your doctor about your allergies to make sure that you get the right vaccine.  pregnant or trying to get pregnant  breast-feeding How should I use this medicine? This vaccine is for injection into a muscle or under the skin. It is given by a health care professional. A copy of Vaccine Information Statements will be given before each vaccination. Read this sheet carefully each time. The sheet may change frequently. Talk to your healthcare provider to see which vaccines are right for you. Some vaccines should not be used in all age groups. Overdosage: If you think you have taken too much of this medicine contact a poison control center or emergency room at once. NOTE: This medicine is only for you. Do not share this medicine with others. What if I miss a dose? This does not apply. What may interact with this medicine?  chemotherapy or radiation therapy  medicines that lower your immune system like etanercept, anakinra, infliximab, and adalimumab  medicines that treat or prevent blood clots like warfarin  phenytoin  steroid medicines like prednisone or cortisone  theophylline  vaccines This list may not describe all possible interactions. Give your health care provider a list of all the medicines, herbs, non-prescription drugs, or dietary supplements you use. Also tell them if you smoke, drink alcohol, or use illegal drugs. Some items may interact with your medicine. What should I watch for while using this medicine? Report any side effects that do not go  away within 3 days to your doctor or health care professional. Call your health care provider if any unusual symptoms occur within 6 weeks  of receiving this vaccine. You may still catch the flu, but the illness is not usually as bad. You cannot get the flu from the vaccine. The vaccine will not protect against colds or other illnesses that may cause fever. The vaccine is needed every year. What side effects may I notice from receiving this medicine? Side effects that you should report to your doctor or health care professional as soon as possible:  allergic reactions like skin rash, itching or hives, swelling of the face, lips, or tongue Side effects that usually do not require medical attention (report to your doctor or health care professional if they continue or are bothersome):  fever  headache  muscle aches and pains  pain, tenderness, redness, or swelling at the injection site  tiredness This list may not describe all possible side effects. Call your doctor for medical advice about side effects. You may report side effects to FDA at 1-800-FDA-1088. Where should I keep my medicine? The vaccine will be given by a health care professional in a clinic, pharmacy, doctor's office, or other health care setting. You will not be given vaccine doses to store at home. NOTE: This sheet is a summary. It may not cover all possible information. If you have questions about this medicine, talk to your doctor, pharmacist, or health care provider.  2020 Elsevier/Gold Standard (2018-10-14 08:45:43)   Please show the [place] at check-in to the Emergency Department and triage nurse.

## 2020-09-07 NOTE — Progress Notes (Signed)
[facility] OFFICE PROGRESS NOTE   Diagnosis: Multiple myeloma  INTERVAL HISTORY:   Steve Carter returns as scheduled. He completed another treatment with daratumumab on 08/10/20 he began a cycle of pomalidomide on 08/12/20. No respiratory symptoms. No pain. He underwent a restaging evaluation at [facility] yesterday including a bone marrow biopsy. He reports pain in the feet for years, but he now has numbness. No recent fall. Good appetite.  Objective:  Vital signs in last 24 hours:  Blood pressure 120/75, pulse 67, temperature (!) 97.2 F (36.2 C), temperature source Tympanic, resp. rate 17, height 6\' 2"  (1.88 m), weight 265 lb 8 oz (120.4 kg), SpO2 98 %.    HEENT: No thrush Resp: Lungs clear bilaterally Cardio: Regular rate and rhythm GI: No hepatomegaly, nontender Vascular: No leg edema  Skin: Gauze dressings at bone marrow biopsy sites at the bilateral posterior iliac   Lab Results:  Lab Results  Component Value Date   WBC 4.6 09/07/2020   HGB 12.7 (L) 09/07/2020   HCT 36.6 (L) 09/07/2020   MCV 95.8 09/07/2020   PLT 182 09/07/2020   NEUTROABS 2.7 09/07/2020    CMP  Lab Results  Component Value Date   NA 134 (L) 09/07/2020   K 4.4 09/07/2020   CL 99 09/07/2020   CO2 25 09/07/2020   GLUCOSE 223 (H) 09/07/2020   BUN 21 (H) 09/07/2020   CREATININE 1.50 (H) 09/07/2020   CALCIUM 9.4 09/07/2020   PROT 6.7 09/07/2020   ALBUMIN 4.1 09/07/2020   AST 24 09/07/2020   ALT 47 (H) 09/07/2020   ALKPHOS 56 09/07/2020   BILITOT 0.3 09/07/2020   GFRNONAA 52 (L) 09/07/2020   GFRAA >60 08/10/2020     Medications: I have reviewed the patient's current medications.   Assessment/Plan: 1. Multiple myeloma-IgG lambda serum monoclonal protein, elevated serum lambda light chains  Bone survey 10/11/2017-lytic lesions noted in the skull, right iliac, left second rib, and mottled appearance of the proximal femurs/pelvis  Bone marrow biopsy  10/14/2017-hypercellular marrow with plasma cell neoplasm, 82% plasma cells, lambda light chain restricted, hyperdiploid with gains of chromosomes 3, 5, 7, 9, and 11. FISH panel positive for gain of ATM(+11)  Cycle 1 RVD 10/18/2017 (Revlimid started 10/23/2017)  Cycle 2 RVD 11/15/2017  Cycle 3 RVD 12/13/2017  Cycle 4 RVD 01/14/2018  Cycle 5 of RVD 02/14/2018 (Revlimid given for 7 days and 1 week of Velcade), therapy held beginning 02/21/2018 secondary to plan for stem cell therapy  Bone marrow biopsy 02/20/2018, 1-2% plasma cell  PET scan 02/20/2018, no malignant range activity above background, numerous lytic lesions throughout the axial and appendicular skeleton,, left iliac wing fracture  Melphalan, 200 mg/m on 03/24/2018  Stem cell infusion 03/25/2018  Restaging at [facility] 07/09/2018: Normal lambda light chains, no serum M spike, bone marrow biopsy negative for myeloma, less than 1% plasma cells  PET scan at [facility] 07/09/2018-lytic bone lesions, no malignant range activity  Initiation of maintenance Revlimid, 10 mg, 21/28 days 08/15/2018  Cycle 2 maintenance Revlimid 09/12/2018  Cycle 3 maintenance Revlimid 10/10/2018  Revlimid placed on hold 06/15/2019 due to presyncopal/syncopal episodes and diarrhea  Revlimid resumed 5 mg 21 days on/7 days off following office visit 06/29/2019  Revlimidplaced on hold 07/14/2019  Revlimid resumed 08/21/2019  Bone survey 09/09/2019-no acute findings or clear explanation for right buttock pain. Lucent lesions in the right pelvis are stable without pathologic fracture. Evidence of healing lytic lesions in the right scapula, cervical spine  spinous processes and left L4 transverse process. Possible mild progression of lytic lesions within the L1 and L4 vertebral bodies. Stable lytic lesions in the calvarium.  PET scan 09/25/2019-new FDG avid bone lesions in the right humeral neck and sacrum similar remaining lytic lesions with FDG activity  below background  Bone marrow biopsy at [facility] on 10/01/2019-5% plasma cells on the bone marrow biopsy suboptimal sample  Cycle 1 daratumumab, pomalidomide, Decadron 10/19/2019  Zometa 10/19/2019  Cycle 2 daratumumab, pomalidomide, Decadron 11/16/2019 (he will begin the pomalidomide 11/17/2019)  Cycle 3 daratumumab, pomalidomide, and Decadron 12/14/2019  Cycle 4 daratumumab, pomalidomide, and Decadron 01/11/2020  Cycle 5 daratumumab, pomalidomide, Decadron 02/03/2020 (he began pomalidomide on 02/06/2020)  Cycle 6 daratumumab, pomalidomide, Decadron 03/02/2020 (pomalidomide starting 03/12/2020)  Cycle 7 daratumumab, pomalidomide, Decadron 03/29/2020 (pomalidomide scheduled to start 04/15/2020)  Cycle 8 daratumumab, pomalidomide, Decadron 05/04/2020 (pomalidomide started 05/14/2020)  Cycle 9 daratumumab-monthly 06/15/2020, pomalidomide held  Cycle 10 daratumumab 07/13/2020, pomalidomide 2 mg 21 days beginning 07/15/2020  Cycle 11 daratumumab 08/10/2020, pomalidomide 2 mg 21 days beginning 08/12/2020  Cycle 12 daratumumab 09/09/2020, pomalidomide 2 mg 21 days beginning 09/09/2020   2.Pain secondary to multiple myeloma involving the spine and pelvis-resolved  MRI of the lumbar spine 10/02/2018-numerous rounded foci in the vertebral bodies, hypertrophy of the L4 transverse process  3.Hypertension-losartan dose increased 11/15/2017  4.Depression-improved with Wellbutrin beginning September 2020  5.Altered mental status-improved depression related? 6.Diabetes 7. Recurrent episodes of fall/syncope-etiology unclear-evaluated by neurology at [facility], MRI brain 01/25/2020 with abnormal signal at the left superior frontal gyrus-potentially representing a low-grade glioma, scattered foci of increased T2 white matter signal-likely related to chronic small vessel disease  Stereotactic brain biopsy at [facility] 03/14/2020-gliosis, no evidence of malignancy  8.  Peripheral  neuropathy-gabapentin started 07/07/2020  9.  Renal insufficiency     Disposition: [name] appears unchanged. He will begin another cycle of daratumumab and pomalidomide today. He will return for an office visit, daratumumab, and Zometa in 1 month. We will follow up on the myeloma panel from today.  He underwent a restaging evaluation at [facility] including a bone marrow biopsy yesterday. He is scheduled to see Dr. Marcell Anger in 2 weeks.  Mr. Valletta will receive an influenza vaccine today.  Betsy Coder, MD  09/07/2020  10:10 AM

## 2020-09-08 ENCOUNTER — Encounter: Payer: Self-pay | Admitting: Nurse Practitioner

## 2020-09-08 ENCOUNTER — Telehealth: Payer: Self-pay | Admitting: Oncology

## 2020-09-08 LAB — KAPPA/LAMBDA LIGHT CHAINS
Kappa free light chain: 15.5 mg/L (ref 3.3–19.4)
Kappa, lambda light chain ratio: 0.99 (ref 0.26–1.65)
Lambda free light chains: 15.6 mg/L (ref 5.7–26.3)

## 2020-09-08 NOTE — Telephone Encounter (Signed)
Scheduled per 10/6 los. Spoke with pt's wife Olivia Mackie and is aware of appt times and date.

## 2020-09-10 LAB — PROTEIN ELECTROPHORESIS, SERUM
A/G Ratio: 1.5 (ref 0.7–1.7)
Albumin ELP: 3.8 g/dL (ref 2.9–4.4)
Alpha-1-Globulin: 0.2 g/dL (ref 0.0–0.4)
Alpha-2-Globulin: 1 g/dL (ref 0.4–1.0)
Beta Globulin: 0.9 g/dL (ref 0.7–1.3)
Gamma Globulin: 0.3 g/dL — ABNORMAL LOW (ref 0.4–1.8)
Globulin, Total: 2.5 g/dL (ref 2.2–3.9)
Total Protein ELP: 6.3 g/dL (ref 6.0–8.5)

## 2020-09-30 ENCOUNTER — Other Ambulatory Visit: Payer: Self-pay | Admitting: Oncology

## 2020-10-03 ENCOUNTER — Other Ambulatory Visit: Payer: Self-pay | Admitting: *Deleted

## 2020-10-03 MED ORDER — POMALIDOMIDE 2 MG PO CAPS: ORAL_CAPSULE | ORAL | 0 refills | Status: DC

## 2020-10-05 ENCOUNTER — Inpatient Hospital Stay: Payer: Medicare Other | Admitting: Nurse Practitioner

## 2020-10-05 ENCOUNTER — Inpatient Hospital Stay: Payer: Medicare Other

## 2020-10-05 ENCOUNTER — Telehealth: Payer: Self-pay | Admitting: Oncology

## 2020-10-05 ENCOUNTER — Telehealth: Payer: Self-pay | Admitting: *Deleted

## 2020-10-05 NOTE — Telephone Encounter (Signed)
Scheduled appt per 11/3 sch msg - pt wife is aware of appt date and time on 11/8

## 2020-10-05 NOTE — Telephone Encounter (Signed)
Wife had called [facility] to cancel his appointments today. She reports to RN that he had severe diarrhea overnight and is exhausted today. Started having intermittent nausea/diarrhea on Friday and it is worse now. No fever. Has had #3 COVID vaccines. Taking the Questran qid provided by [facility] and compazine. Encouraged her to call PCP to get him seen today, or at least have stool culture collected to confirm he does not have a bacterial infection or C. Diff. She agrees to do so. Scheduling message sent to reschedule to next week.

## 2020-10-10 ENCOUNTER — Ambulatory Visit: Payer: Medicare Other

## 2020-10-10 ENCOUNTER — Other Ambulatory Visit: Payer: Medicare Other

## 2020-10-10 ENCOUNTER — Ambulatory Visit: Payer: BC Managed Care – PPO | Admitting: Nurse Practitioner

## 2020-10-11 ENCOUNTER — Inpatient Hospital Stay: Payer: Medicare Other

## 2020-10-11 ENCOUNTER — Inpatient Hospital Stay (HOSPITAL_BASED_OUTPATIENT_CLINIC_OR_DEPARTMENT_OTHER): Payer: Medicare Other | Admitting: Nurse Practitioner

## 2020-10-11 ENCOUNTER — Encounter: Payer: Self-pay | Admitting: Nurse Practitioner

## 2020-10-11 ENCOUNTER — Other Ambulatory Visit: Payer: Self-pay

## 2020-10-11 ENCOUNTER — Inpatient Hospital Stay: Payer: Medicare Other | Attending: Nurse Practitioner

## 2020-10-11 VITALS — BP 119/73 | HR 67 | Temp 98.1°F | Resp 17 | Ht 74.0 in | Wt 264.3 lb

## 2020-10-11 DIAGNOSIS — R4182 Altered mental status, unspecified: Secondary | ICD-10-CM | POA: Diagnosis not present

## 2020-10-11 DIAGNOSIS — N289 Disorder of kidney and ureter, unspecified: Secondary | ICD-10-CM | POA: Diagnosis not present

## 2020-10-11 DIAGNOSIS — T451X5A Adverse effect of antineoplastic and immunosuppressive drugs, initial encounter: Secondary | ICD-10-CM | POA: Diagnosis not present

## 2020-10-11 DIAGNOSIS — R197 Diarrhea, unspecified: Secondary | ICD-10-CM | POA: Insufficient documentation

## 2020-10-11 DIAGNOSIS — C9000 Multiple myeloma not having achieved remission: Secondary | ICD-10-CM | POA: Insufficient documentation

## 2020-10-11 DIAGNOSIS — G620 Drug-induced polyneuropathy: Secondary | ICD-10-CM | POA: Insufficient documentation

## 2020-10-11 DIAGNOSIS — I10 Essential (primary) hypertension: Secondary | ICD-10-CM | POA: Diagnosis not present

## 2020-10-11 DIAGNOSIS — G893 Neoplasm related pain (acute) (chronic): Secondary | ICD-10-CM | POA: Insufficient documentation

## 2020-10-11 DIAGNOSIS — E119 Type 2 diabetes mellitus without complications: Secondary | ICD-10-CM | POA: Diagnosis not present

## 2020-10-11 DIAGNOSIS — Z79899 Other long term (current) drug therapy: Secondary | ICD-10-CM | POA: Insufficient documentation

## 2020-10-11 DIAGNOSIS — I6782 Cerebral ischemia: Secondary | ICD-10-CM | POA: Diagnosis not present

## 2020-10-11 DIAGNOSIS — R200 Anesthesia of skin: Secondary | ICD-10-CM | POA: Insufficient documentation

## 2020-10-11 DIAGNOSIS — F32A Depression, unspecified: Secondary | ICD-10-CM | POA: Diagnosis not present

## 2020-10-11 DIAGNOSIS — R110 Nausea: Secondary | ICD-10-CM | POA: Insufficient documentation

## 2020-10-11 DIAGNOSIS — Z5112 Encounter for antineoplastic immunotherapy: Secondary | ICD-10-CM | POA: Diagnosis present

## 2020-10-11 LAB — CBC WITH DIFFERENTIAL (CANCER CENTER ONLY)
Abs Immature Granulocytes: 0.04 10*3/uL (ref 0.00–0.07)
Basophils Absolute: 0.1 10*3/uL (ref 0.0–0.1)
Basophils Relative: 2 %
Eosinophils Absolute: 0.1 10*3/uL (ref 0.0–0.5)
Eosinophils Relative: 1 %
HCT: 35.3 % — ABNORMAL LOW (ref 39.0–52.0)
Hemoglobin: 12.5 g/dL — ABNORMAL LOW (ref 13.0–17.0)
Immature Granulocytes: 1 %
Lymphocytes Relative: 17 %
Lymphs Abs: 0.8 10*3/uL (ref 0.7–4.0)
MCH: 32.6 pg (ref 26.0–34.0)
MCHC: 35.4 g/dL (ref 30.0–36.0)
MCV: 92.2 fL (ref 80.0–100.0)
Monocytes Absolute: 0.7 10*3/uL (ref 0.1–1.0)
Monocytes Relative: 15 %
Neutro Abs: 2.9 10*3/uL (ref 1.7–7.7)
Neutrophils Relative %: 64 %
Platelet Count: 250 10*3/uL (ref 150–400)
RBC: 3.83 MIL/uL — ABNORMAL LOW (ref 4.22–5.81)
RDW: 12.9 % (ref 11.5–15.5)
WBC Count: 4.6 10*3/uL (ref 4.0–10.5)
nRBC: 0 % (ref 0.0–0.2)

## 2020-10-11 LAB — CMP (CANCER CENTER ONLY)
ALT: 48 U/L — ABNORMAL HIGH (ref 0–44)
AST: 23 U/L (ref 15–41)
Albumin: 4.1 g/dL (ref 3.5–5.0)
Alkaline Phosphatase: 75 U/L (ref 38–126)
Anion gap: 10 (ref 5–15)
BUN: 21 mg/dL — ABNORMAL HIGH (ref 6–20)
CO2: 27 mmol/L (ref 22–32)
Calcium: 9.6 mg/dL (ref 8.9–10.3)
Chloride: 98 mmol/L (ref 98–111)
Creatinine: 1.59 mg/dL — ABNORMAL HIGH (ref 0.61–1.24)
GFR, Estimated: 52 mL/min — ABNORMAL LOW (ref >60)
Glucose, Bld: 218 mg/dL — ABNORMAL HIGH (ref 70–99)
Potassium: 4.5 mmol/L (ref 3.5–5.1)
Sodium: 135 mmol/L (ref 135–145)
Total Bilirubin: 0.3 mg/dL (ref 0.3–1.2)
Total Protein: 6.8 g/dL (ref 6.5–8.1)

## 2020-10-11 MED ORDER — DEXAMETHASONE 4 MG PO TABS
ORAL_TABLET | ORAL | Status: AC
  Filled 2020-10-11: qty 5

## 2020-10-11 MED ORDER — DIPHENHYDRAMINE HCL 25 MG PO CAPS
ORAL_CAPSULE | ORAL | Status: AC
  Filled 2020-10-11: qty 1

## 2020-10-11 MED ORDER — DIPHENHYDRAMINE HCL 25 MG PO CAPS
ORAL_CAPSULE | ORAL | Status: AC
  Filled 2020-10-11: qty 2

## 2020-10-11 MED ORDER — DARATUMUMAB-HYALURONIDASE-FIHJ 1800-30000 MG-UT/15ML ~~LOC~~ SOLN
1800.0000 mg | Freq: Once | SUBCUTANEOUS | Status: AC
  Administered 2020-10-11: 1800 mg via SUBCUTANEOUS
  Filled 2020-10-11: qty 15

## 2020-10-11 MED ORDER — DEXAMETHASONE 4 MG PO TABS
20.0000 mg | ORAL_TABLET | Freq: Once | ORAL | Status: AC
  Administered 2020-10-11: 20 mg via ORAL

## 2020-10-11 MED ORDER — ACETAMINOPHEN 325 MG PO TABS
650.0000 mg | ORAL_TABLET | Freq: Once | ORAL | Status: AC
  Administered 2020-10-11: 650 mg via ORAL

## 2020-10-11 MED ORDER — DIPHENHYDRAMINE HCL 25 MG PO CAPS
50.0000 mg | ORAL_CAPSULE | Freq: Once | ORAL | Status: AC
  Administered 2020-10-11: 50 mg via ORAL

## 2020-10-11 MED ORDER — ACETAMINOPHEN 325 MG PO TABS
ORAL_TABLET | ORAL | Status: AC
  Filled 2020-10-11: qty 2

## 2020-10-11 NOTE — Patient Instructions (Signed)
[place] Discharge Instructions for Patients Receiving Chemotherapy  Today you received the following chemotherapy agents: Darzalex Faspro.  To help prevent nausea and vomiting after your treatment, we encourage you to take your nausea medication as directed.   If you develop nausea and vomiting that is not controlled by your nausea medication, call the clinic.   BELOW ARE SYMPTOMS THAT SHOULD BE REPORTED IMMEDIATELY:  *FEVER GREATER THAN 100.5 F  *CHILLS WITH OR WITHOUT FEVER  NAUSEA AND VOMITING THAT IS NOT CONTROLLED WITH YOUR NAUSEA MEDICATION  *UNUSUAL SHORTNESS OF BREATH  *UNUSUAL BRUISING OR BLEEDING  TENDERNESS IN MOUTH AND THROAT WITH OR WITHOUT PRESENCE OF ULCERS  *URINARY PROBLEMS  *BOWEL PROBLEMS  UNUSUAL RASH Items with * indicate a potential emergency and should be followed up as soon as possible.  Feel free to call the clinic should you have any questions or concerns. The clinic phone number is [phone].

## 2020-10-11 NOTE — Progress Notes (Addendum)
Steve Carter   Diagnosis: Multiple myeloma  INTERVAL HISTORY:   Steve Carter returns for follow-up.  He completed a cycle of pomalidomide beginning 09/09/2020.  He completed a cycle of daratumumab 09/09/2020.  He canceled his appointment last week due to sudden onset of severe diarrhea.  He had some nausea as well.  Tums resolved over about 24 hours.  His wife reports a Covid test was negative as well as a stool culture.  Bowel habits have returned to baseline which is intermittent diarrhea.  He continues Questran.  No nausea or vomiting.  He has a good appetite.  He denies pain.  No fever, cough, shortness of breath.  He has increased numbness dorsal aspect of the right foot.  Objective:  Vital signs in last 24 hours:  Blood pressure 119/73, pulse 67, temperature 98.1 F (36.7 C), temperature source Tympanic, resp. rate 17, height _0  (1.88 m), weight 264 lb 4.8 oz (119.9 kg), SpO2 99 %.    HEENT: No thrush or ulcers. Resp: Lungs clear bilaterally. Cardio: Regular rate and rhythm. GI: Abdomen soft and nontender.  No hepatomegaly. Vascular: Trace bilateral ankle edema.   Lab Results:  Lab Results  Component Value Date   WBC 4.6 10/11/2020   HGB 12.5 (L) 10/11/2020   HCT 35.3 (L) 10/11/2020   MCV 92.2 10/11/2020   PLT 250 10/11/2020   NEUTROABS 2.9 10/11/2020    Imaging:  No results found.  Medications: I have reviewed the patient's current medications.  Assessment/Plan: 1.Multiple myeloma-IgG lambda serum monoclonal protein, elevated serum lambda light chains  Bone survey 10/11/2017-lytic lesions noted in the skull, right iliac, left second rib, and mottled appearance of the proximal femurs/pelvis  Bone marrow biopsy 10/14/2017-hypercellular marrow with plasma cell neoplasm, 82% plasma cells, lambda light chain restricted, hyperdiploid with gains of chromosomes 3, 5, 7, 9, and 11. FISH panel positive for gain of ATM(+11)  Cycle  1 RVD 10/18/2017 (Revlimid started 10/23/2017)  Cycle 2 RVD 11/15/2017  Cycle 3 RVD 12/13/2017  Cycle 4 RVD 01/14/2018  Cycle 5 of RVD 02/14/2018 (Revlimid given for 7 days and 1 week of Velcade), therapy held beginning 02/21/2018 secondary to plan for stem cell therapy  Bone marrow biopsy 02/20/2018, 1-2% plasma cell  PET scan 02/20/2018, no malignant range activity above background, numerous lytic lesions throughout the axial and appendicular skeleton,, left iliac wing fracture  Melphalan, 200 mg/m on 03/24/2018  Stem cell infusion 03/25/2018  Restaging at [facility] 07/09/2018: Normal lambda light chains, no serum M spike, bone marrow biopsy negative for myeloma, less than 1% plasma cells  PET scan at [facility] 07/09/2018-lytic bone lesions, no malignant range activity  Initiation of maintenance Revlimid, 10 mg, 21/28 days 08/15/2018  Cycle 2 maintenance Revlimid 09/12/2018  Cycle 3 maintenance Revlimid 10/10/2018  Revlimid placed on hold 06/15/2019 due to presyncopal/syncopal episodes and diarrhea  Revlimid resumed 5 mg 21 days on/7 days off following office visit 06/29/2019  Revlimidplaced on hold 07/14/2019  Revlimid resumed 08/21/2019  Bone survey 09/09/2019-no acute findings or clear explanation for right buttock pain. Lucent lesions in the right pelvis are stable without pathologic fracture. Evidence of healing lytic lesions in the right scapula, cervical spine spinous processes and left L4 transverse process. Possible mild progression of lytic lesions within the L1 and L4 vertebral bodies. Stable lytic lesions in the calvarium.  PET scan 09/25/2019-new FDG avid bone lesions in the right humeral neck and sacrum similar remaining lytic lesions with FDG  activity below background  Bone marrow biopsy at [facility] on 10/01/2019-5% plasma cells on the bone marrow biopsy suboptimal sample  Cycle 1 daratumumab, pomalidomide, Decadron 10/19/2019  Zometa 10/19/2019  Cycle 2  daratumumab, pomalidomide, Decadron 11/16/2019 (he will begin the pomalidomide 11/17/2019)  Cycle 3 daratumumab, pomalidomide, and Decadron 12/14/2019  Cycle 4 daratumumab, pomalidomide, and Decadron 01/11/2020  Cycle 5 daratumumab, pomalidomide, Decadron 02/03/2020 (he began pomalidomide on 02/06/2020)  Cycle 6 daratumumab, pomalidomide, Decadron 03/02/2020 (pomalidomide starting 03/12/2020)  Cycle 7 daratumumab, pomalidomide, Decadron 03/29/2020 (pomalidomide scheduled to start 04/15/2020)  Cycle 8 daratumumab, pomalidomide, Decadron 05/04/2020 (pomalidomide started 05/14/2020)  Cycle 9 daratumumab-monthly 06/15/2020, pomalidomide held  Cycle 10 daratumumab 07/13/2020, pomalidomide 2 mg 21 days beginning 07/15/2020  Cycle 11 daratumumab 08/10/2020, pomalidomide 2 mg 21 days beginning 08/12/2020  Bone marrow biopsy 09/06/2020-hypocellular bone marrow with a relative erythroid hyperplasia and no increase in plasma cells; MRD 0.0050%  Cycle 12 daratumumab 09/07/2020, pomalidomide 2 mg 21 days beginning 09/09/2020  Cycle 13 daratumumab 10/11/2020, pomalidomide 2 mg 21 days beginning 10/10/2020   2.Pain secondary to multiple myeloma involving the spine and pelvis-resolved  MRI of the lumbar spine 10/02/2018-numerous rounded foci in the vertebral bodies, hypertrophy of the L4 transverse process  3.Hypertension-losartan dose increased 11/15/2017  4.Depression-improved with Wellbutrin beginning September 2020  5.Altered mental status-improved depression related? 6.Diabetes 7. Recurrent episodes of fall/syncope-etiology unclear-evaluated by neurology at [facility], MRI brain 01/25/2020 with abnormal signal at the left superior frontal gyrus-potentially representing a low-grade glioma, scattered foci of increased T2 white matter signal-likely related to chronic small vessel disease  Stereotactic brain biopsy at [facility] 03/14/2020-gliosis, no evidence of malignancy  8.Peripheral  neuropathy-gabapentin started 07/07/2020  9.Renal insufficiency  Disposition: Steve Carter appears stable.  He has completed 12 cycles of daratumumab/pomalidomide.  He is currently receiving the daratumumab on a monthly schedule and pomalidomide 2 mg 21 days.  The bone marrow biopsy done last month at [facility] showed no increase in plasma cells, MRD 0.0050%.  Dr. Marjory Lies recommendation is to continue the current treatment regimen.  Steve Carter began pomalidomide yesterday and will receive daratumumab today.  He will return for lab, follow-up, daratumumab and Zometa in 1 month.  He will contact the office in the interim with any problems.  Patient seen with Dr. Benay Spice.    [name] ANP/GNP-BC   10/11/2020  12:42 PM  This was a shared visit with [name]. Steve Carter appears unchanged. He had minimal residual disease on the bone marrow biopsy at [facility] last month. The plan is to continue pomalidomide/Decadron and daratumumab. I will discuss the case with Dr. Marcell Anger regarding the indication for continuing the current treatment regimen versus changing to a maintenance regimen.  Julieanne Manson, MD

## 2020-10-12 ENCOUNTER — Telehealth: Payer: Self-pay | Admitting: Nurse Practitioner

## 2020-10-12 LAB — PROTEIN ELECTROPHORESIS, SERUM
A/G Ratio: 1.4 (ref 0.7–1.7)
Albumin ELP: 3.7 g/dL (ref 2.9–4.4)
Alpha-1-Globulin: 0.2 g/dL (ref 0.0–0.4)
Alpha-2-Globulin: 1.1 g/dL — ABNORMAL HIGH (ref 0.4–1.0)
Beta Globulin: 0.9 g/dL (ref 0.7–1.3)
Gamma Globulin: 0.4 g/dL (ref 0.4–1.8)
Globulin, Total: 2.6 g/dL (ref 2.2–3.9)
Total Protein ELP: 6.3 g/dL (ref 6.0–8.5)

## 2020-10-12 LAB — KAPPA/LAMBDA LIGHT CHAINS
Kappa free light chain: 13.1 mg/L (ref 3.3–19.4)
Kappa, lambda light chain ratio: 0.88 (ref 0.26–1.65)
Lambda free light chains: 14.9 mg/L (ref 5.7–26.3)

## 2020-10-12 NOTE — Telephone Encounter (Signed)
Scheduled appointments per 11/9 los. Spoke to patient's wife who is aware of appointments dates and times.

## 2020-10-17 ENCOUNTER — Encounter: Payer: Self-pay | Admitting: Oncology

## 2020-10-17 ENCOUNTER — Encounter: Payer: Self-pay | Admitting: *Deleted

## 2020-10-17 NOTE — Progress Notes (Signed)
MD signed DMV handicap placard application. Put in mail to patient's home.

## 2020-10-30 ENCOUNTER — Other Ambulatory Visit: Payer: Self-pay | Admitting: Oncology

## 2020-10-31 ENCOUNTER — Other Ambulatory Visit: Payer: Self-pay | Admitting: *Deleted

## 2020-10-31 NOTE — Telephone Encounter (Signed)
Received refill request for Pomalyst from pharmacy.

## 2020-11-02 ENCOUNTER — Encounter: Payer: Self-pay | Admitting: *Deleted

## 2020-11-08 ENCOUNTER — Other Ambulatory Visit: Payer: Self-pay

## 2020-11-08 ENCOUNTER — Telehealth: Payer: Self-pay | Admitting: *Deleted

## 2020-11-08 ENCOUNTER — Inpatient Hospital Stay: Payer: Medicare Other

## 2020-11-08 ENCOUNTER — Inpatient Hospital Stay: Payer: Medicare Other | Attending: Nurse Practitioner | Admitting: Oncology

## 2020-11-08 ENCOUNTER — Other Ambulatory Visit: Payer: BC Managed Care – PPO

## 2020-11-08 VITALS — BP 136/87 | HR 71 | Temp 97.3°F | Resp 16 | Ht 74.0 in | Wt 274.6 lb

## 2020-11-08 DIAGNOSIS — C9000 Multiple myeloma not having achieved remission: Secondary | ICD-10-CM

## 2020-11-08 DIAGNOSIS — R200 Anesthesia of skin: Secondary | ICD-10-CM | POA: Insufficient documentation

## 2020-11-08 DIAGNOSIS — G893 Neoplasm related pain (acute) (chronic): Secondary | ICD-10-CM | POA: Insufficient documentation

## 2020-11-08 DIAGNOSIS — N289 Disorder of kidney and ureter, unspecified: Secondary | ICD-10-CM | POA: Diagnosis not present

## 2020-11-08 DIAGNOSIS — I10 Essential (primary) hypertension: Secondary | ICD-10-CM | POA: Insufficient documentation

## 2020-11-08 DIAGNOSIS — E119 Type 2 diabetes mellitus without complications: Secondary | ICD-10-CM | POA: Diagnosis not present

## 2020-11-08 DIAGNOSIS — I6782 Cerebral ischemia: Secondary | ICD-10-CM | POA: Diagnosis not present

## 2020-11-08 DIAGNOSIS — Z23 Encounter for immunization: Secondary | ICD-10-CM | POA: Diagnosis not present

## 2020-11-08 DIAGNOSIS — G629 Polyneuropathy, unspecified: Secondary | ICD-10-CM | POA: Diagnosis not present

## 2020-11-08 DIAGNOSIS — Z79899 Other long term (current) drug therapy: Secondary | ICD-10-CM | POA: Diagnosis not present

## 2020-11-08 DIAGNOSIS — Z5112 Encounter for antineoplastic immunotherapy: Secondary | ICD-10-CM | POA: Diagnosis present

## 2020-11-08 DIAGNOSIS — F32A Depression, unspecified: Secondary | ICD-10-CM | POA: Diagnosis not present

## 2020-11-08 LAB — CBC WITH DIFFERENTIAL (CANCER CENTER ONLY)
Abs Immature Granulocytes: 0.04 10*3/uL (ref 0.00–0.07)
Basophils Absolute: 0.1 10*3/uL (ref 0.0–0.1)
Basophils Relative: 2 %
Eosinophils Absolute: 0.1 10*3/uL (ref 0.0–0.5)
Eosinophils Relative: 2 %
HCT: 36.5 % — ABNORMAL LOW (ref 39.0–52.0)
Hemoglobin: 12.5 g/dL — ABNORMAL LOW (ref 13.0–17.0)
Immature Granulocytes: 1 %
Lymphocytes Relative: 15 %
Lymphs Abs: 0.7 10*3/uL (ref 0.7–4.0)
MCH: 31.8 pg (ref 26.0–34.0)
MCHC: 34.2 g/dL (ref 30.0–36.0)
MCV: 92.9 fL (ref 80.0–100.0)
Monocytes Absolute: 0.8 10*3/uL (ref 0.1–1.0)
Monocytes Relative: 16 %
Neutro Abs: 3 10*3/uL (ref 1.7–7.7)
Neutrophils Relative %: 64 %
Platelet Count: 195 10*3/uL (ref 150–400)
RBC: 3.93 MIL/uL — ABNORMAL LOW (ref 4.22–5.81)
RDW: 13.2 % (ref 11.5–15.5)
WBC Count: 4.7 10*3/uL (ref 4.0–10.5)
nRBC: 0 % (ref 0.0–0.2)

## 2020-11-08 MED ORDER — DIPHENHYDRAMINE HCL 25 MG PO CAPS
ORAL_CAPSULE | ORAL | Status: AC
  Filled 2020-11-08: qty 2

## 2020-11-08 MED ORDER — HEPARIN SOD (PORK) LOCK FLUSH 100 UNIT/ML IV SOLN
250.0000 [IU] | Freq: Once | INTRAVENOUS | Status: DC | PRN
  Filled 2020-11-08: qty 5

## 2020-11-08 MED ORDER — DARATUMUMAB-HYALURONIDASE-FIHJ 1800-30000 MG-UT/15ML ~~LOC~~ SOLN
1800.0000 mg | Freq: Once | SUBCUTANEOUS | Status: AC
  Administered 2020-11-08: 1800 mg via SUBCUTANEOUS
  Filled 2020-11-08: qty 15

## 2020-11-08 MED ORDER — ACETAMINOPHEN 325 MG PO TABS
650.0000 mg | ORAL_TABLET | Freq: Once | ORAL | Status: AC
  Administered 2020-11-08: 650 mg via ORAL

## 2020-11-08 MED ORDER — SODIUM CHLORIDE 0.9 % IV SOLN
Freq: Once | INTRAVENOUS | Status: AC
  Filled 2020-11-08: qty 250

## 2020-11-08 MED ORDER — ACETAMINOPHEN 325 MG PO TABS
ORAL_TABLET | ORAL | Status: AC
  Filled 2020-11-08: qty 2

## 2020-11-08 MED ORDER — SODIUM CHLORIDE 0.9% FLUSH
10.0000 mL | INTRAVENOUS | Status: DC | PRN
  Filled 2020-11-08: qty 10

## 2020-11-08 MED ORDER — ZOLEDRONIC ACID 4 MG/100ML IV SOLN
4.0000 mg | Freq: Once | INTRAVENOUS | Status: AC
  Administered 2020-11-08: 4 mg via INTRAVENOUS

## 2020-11-08 MED ORDER — ZOLEDRONIC ACID 4 MG/100ML IV SOLN
INTRAVENOUS | Status: AC
  Filled 2020-11-08: qty 100

## 2020-11-08 MED ORDER — DEXAMETHASONE 4 MG PO TABS
20.0000 mg | ORAL_TABLET | Freq: Once | ORAL | Status: AC
  Administered 2020-11-08: 20 mg via ORAL

## 2020-11-08 MED ORDER — POMALIDOMIDE 1 MG PO CAPS: 1.0000 mg | ORAL_CAPSULE | Freq: Every day | ORAL | 0 refills | Status: DC

## 2020-11-08 MED ORDER — VARICELLA VIRUS VACCINE LIVE 1350 PFU/0.5ML IJ SUSR
0.5000 mL | Freq: Once | INTRAMUSCULAR | Status: AC
  Administered 2020-11-08: 0.5 mL via SUBCUTANEOUS
  Filled 2020-11-08: qty 0.5

## 2020-11-08 MED ORDER — DIPHENHYDRAMINE HCL 25 MG PO CAPS
50.0000 mg | ORAL_CAPSULE | Freq: Once | ORAL | Status: AC
  Administered 2020-11-08: 50 mg via ORAL

## 2020-11-08 MED ORDER — DEXAMETHASONE 4 MG PO TABS
ORAL_TABLET | ORAL | Status: AC
  Filled 2020-11-08: qty 5

## 2020-11-08 MED ORDER — MEASLES, MUMPS & RUBELLA VAC IJ SOLR
0.5000 mL | Freq: Once | INTRAMUSCULAR | Status: AC
  Administered 2020-11-08: 0.5 mL via SUBCUTANEOUS
  Filled 2020-11-08: qty 0.5

## 2020-11-08 NOTE — Progress Notes (Signed)
Received note from [place], RN per Dr. Benay Spice to infuse Zometa today based on CMP from November. In addition, pt to receive two post transplant vaccines during visit today. Pt given vaccine information.- TPR Spoke with pharmacy to [place] re: Zometa and warning of giving vaccine with Decadron. Pharmacy states okay to proceed with both vaccines and Zometa.- TPR Pt received Darzalex injection 60 minutes post pre-meds, Zometa infusion and both vaccines as noted in MAR.- TPR Pt waited 15 minutes post vaccines, stable at discharged. Denied need for AVS- TPR

## 2020-11-08 NOTE — Telephone Encounter (Signed)
Pomalyst Prior Authorization placed in oral chemotherapy pharmacist folder for pickup.

## 2020-11-08 NOTE — Telephone Encounter (Signed)
Voice mail from [place] that his pomalidomide 1 mg requires a PA.  Forwarded request to [place], Therapist, sports.

## 2020-11-08 NOTE — Progress Notes (Signed)
Per Dr. Benay Spice: OK to treat today without a CMP. Please give zometa today if schedule allows. May use CMP from November to dose--check w/pharmacy to see if they wish to reduce Zometa dose based on creatinine 1.56.

## 2020-11-08 NOTE — Progress Notes (Signed)
[place] OFFICE PROGRESS NOTE   Diagnosis: Multiple myeloma  INTERVAL HISTORY:   Steve Carter returns as scheduled.  He is due to begin another cycle of pomalidomide and daratumumab.  He feels well.  Good appetite.  No pain.  He reports increased numbness in the left foot.  No motor symptoms.  Objective:  Vital signs in last 24 hours:  Blood pressure 136/87, pulse 71, temperature (!) 97.3 F (36.3 C), temperature source Tympanic, resp. rate 16, height _0  (1.88 m), weight 274 lb 9.6 oz (124.6 kg), SpO2 98 %.    HEENT: No thrush or ulcers Resp: Lungs clear bilaterally Cardio: Regular rate and rhythm GI: No hepatosplenomegaly Vascular: No leg edema, the right lower leg is slightly larger than the left side, no erythema or tenderness    Lab Results:  Lab Results  Component Value Date   WBC 4.7 11/08/2020   HGB 12.5 (L) 11/08/2020   HCT 36.5 (L) 11/08/2020   MCV 92.9 11/08/2020   PLT 195 11/08/2020   NEUTROABS 3.0 11/08/2020    CMP  Lab Results  Component Value Date   NA 135 10/11/2020   K 4.5 10/11/2020   CL 98 10/11/2020   CO2 27 10/11/2020   GLUCOSE 218 (H) 10/11/2020   BUN 21 (H) 10/11/2020   CREATININE 1.59 (H) 10/11/2020   CALCIUM 9.6 10/11/2020   PROT 6.8 10/11/2020   ALBUMIN 4.1 10/11/2020   AST 23 10/11/2020   ALT 48 (H) 10/11/2020   ALKPHOS 75 10/11/2020   BILITOT 0.3 10/11/2020   GFRNONAA 52 (L) 10/11/2020   GFRAA >60 08/10/2020     Medications: I have reviewed the patient's current medications.   Assessment/Plan: 1.Multiple myeloma-IgG lambda serum monoclonal protein, elevated serum lambda light chains  Bone survey 10/11/2017-lytic lesions noted in the skull, right iliac, left second rib, and mottled appearance of the proximal femurs/pelvis  Bone marrow biopsy 10/14/2017-hypercellular marrow with plasma cell neoplasm, 82% plasma cells, lambda light chain restricted, hyperdiploid with gains of chromosomes 3, 5, 7, 9,  and 11. FISH panel positive for gain of ATM(+11)  Cycle 1 RVD 10/18/2017 (Revlimid started 10/23/2017)  Cycle 2 RVD 11/15/2017  Cycle 3 RVD 12/13/2017  Cycle 4 RVD 01/14/2018  Cycle 5 of RVD 02/14/2018 (Revlimid given for 7 days and 1 week of Velcade), therapy held beginning 02/21/2018 secondary to plan for stem cell therapy  Bone marrow biopsy 02/20/2018, 1-2% plasma cell  PET scan 02/20/2018, no malignant range activity above background, numerous lytic lesions throughout the axial and appendicular skeleton,, left iliac wing fracture  Melphalan, 200 mg/m on 03/24/2018  Stem cell infusion 03/25/2018  Restaging at [facility] 07/09/2018: Normal lambda light chains, no serum M spike, bone marrow biopsy negative for myeloma, less than 1% plasma cells  PET scan at [facility] 07/09/2018-lytic bone lesions, no malignant range activity  Initiation of maintenance Revlimid, 10 mg, 21/28 days 08/15/2018  Cycle 2 maintenance Revlimid 09/12/2018  Cycle 3 maintenance Revlimid 10/10/2018  Revlimid placed on hold 06/15/2019 due to presyncopal/syncopal episodes and diarrhea  Revlimid resumed 5 mg 21 days on/7 days off following office visit 06/29/2019  Revlimidplaced on hold 07/14/2019  Revlimid resumed 08/21/2019  Bone survey 09/09/2019-no acute findings or clear explanation for right buttock pain. Lucent lesions in the right pelvis are stable without pathologic fracture. Evidence of healing lytic lesions in the right scapula, cervical spine spinous processes and left L4 transverse process. Possible mild progression of lytic lesions within the L1 and  L4 vertebral bodies. Stable lytic lesions in the calvarium.  PET scan 09/25/2019-new FDG avid bone lesions in the right humeral neck and sacrum similar remaining lytic lesions with FDG activity below background  Bone marrow biopsy at [facility] on 10/01/2019-5% plasma cells on the bone marrow biopsy suboptimal sample  Cycle 1 daratumumab,  pomalidomide, Decadron 10/19/2019  Zometa 10/19/2019  Cycle 2 daratumumab, pomalidomide, Decadron 11/16/2019 (he will begin the pomalidomide 11/17/2019)  Cycle 3 daratumumab, pomalidomide, and Decadron 12/14/2019  Cycle 4 daratumumab, pomalidomide, and Decadron 01/11/2020  Cycle 5 daratumumab, pomalidomide, Decadron 02/03/2020 (he began pomalidomide on 02/06/2020)  Cycle 6 daratumumab, pomalidomide, Decadron 03/02/2020 (pomalidomide starting 03/12/2020)  Cycle 7 daratumumab, pomalidomide, Decadron 03/29/2020 (pomalidomide scheduled to start 04/15/2020)  Cycle 8 daratumumab, pomalidomide, Decadron 05/04/2020 (pomalidomide started 05/14/2020)  Cycle 9 daratumumab-monthly 06/15/2020, pomalidomide held  Cycle 10 daratumumab 07/13/2020, pomalidomide 2 mg 21 days beginning 07/15/2020  Cycle 11 daratumumab 08/10/2020, pomalidomide 2 mg 21 days beginning 08/12/2020  Bone marrow biopsy 09/06/2020-hypocellular bone marrow with a relative erythroid hyperplasia and no increase in plasma cells; MRD 0.0050%  Cycle 12 daratumumab 09/07/2020, pomalidomide 2 mg 21 days beginning 09/09/2020  Cycle 13 daratumumab 10/11/2020, pomalidomide 2 mg 21 days beginning 10/10/2020  Cycle 14 daratumumab 11/08/2020, pomalidomide reduced to 1 mg 21 days   2.Pain secondary to multiple myeloma involving the spine and pelvis-resolved  MRI of the lumbar spine 10/02/2018-numerous rounded foci in the vertebral bodies, hypertrophy of the L4 transverse process  3.Hypertension-losartan dose increased 11/15/2017  4.Depression-improved with Wellbutrin beginning September 2020  5.Altered mental status-improved depression related? 6.Diabetes 7. Recurrent episodes of fall/syncope-etiology unclear-evaluated by neurology at [facility], MRI brain 01/25/2020 with abnormal signal at the left superior frontal gyrus-potentially representing a low-grade glioma, scattered foci of increased T2 white matter signal-likely related to chronic  small vessel disease  Stereotactic brain biopsy at [facility] 03/14/2020-gliosis, no evidence of malignancy  8.Peripheral neuropathy-gabapentin started 07/07/2020  9.Renal insufficiency    Disposition: Steve Carter appears unchanged.  I discussed the case with Dr. Marcell Anger last month.  He recommends continuing the current treatment regimen with a dose reduction of the pomalidomide.    Steve Carter will receive Zometa today.  He will complete the post transplant vaccine schedule today.  He will return for an office visit and daratumumab in 1 month.  Betsy Coder, MD  11/08/2020  8:41 AM

## 2020-11-08 NOTE — Addendum Note (Signed)
Addended by: Tania Ade on: 11/08/2020 10:39 AM   Modules accepted: Orders

## 2020-11-09 ENCOUNTER — Encounter: Payer: Self-pay | Admitting: Oncology

## 2020-11-09 ENCOUNTER — Telehealth: Payer: Self-pay | Admitting: Oncology

## 2020-11-09 ENCOUNTER — Telehealth: Payer: Self-pay

## 2020-11-09 NOTE — Telephone Encounter (Signed)
Scheduled appointments per 12/7 los. Spoke to patient's wife who is aware of appointments date and times.

## 2020-11-09 NOTE — Telephone Encounter (Signed)
Oral Oncology Patient Advocate Encounter  Prior Authorization for Pomalyst has been approved.    PA# [id number] Effective dates: 11/09/20 through 11/09/21   Oral Oncology Clinic will continue to follow.    [place] Patient [place] Phone [phone] Fax [phone] 11/09/2020 9:18 AM

## 2020-11-09 NOTE — Telephone Encounter (Signed)
Oral Oncology Patient Advocate Encounter  Received notification from [employer] that prior authorization for Pomalyst is required.  PA submitted on CoverMyMeds Key [id number] Status is pending  Oral Oncology Clinic will continue to follow.  [place] Patient [place] Phone [phone] Fax [phone] 11/09/2020 9:13 AM

## 2020-11-10 ENCOUNTER — Other Ambulatory Visit: Payer: Self-pay | Admitting: Oncology

## 2020-11-10 ENCOUNTER — Other Ambulatory Visit: Payer: Self-pay | Admitting: Adult Health

## 2020-11-10 DIAGNOSIS — F331 Major depressive disorder, recurrent, moderate: Secondary | ICD-10-CM

## 2020-11-10 DIAGNOSIS — F411 Generalized anxiety disorder: Secondary | ICD-10-CM

## 2020-11-13 ENCOUNTER — Encounter: Payer: Self-pay | Admitting: Oncology

## 2020-11-21 ENCOUNTER — Encounter: Payer: Self-pay | Admitting: Oncology

## 2020-11-21 ENCOUNTER — Encounter: Payer: Self-pay | Admitting: *Deleted

## 2020-11-29 ENCOUNTER — Telehealth: Payer: Self-pay | Admitting: *Deleted

## 2020-11-29 NOTE — Telephone Encounter (Signed)
Called & spoke with pt's wife to see if pt is on testosterone.  She reports that he is getting shots twice a month.

## 2020-11-30 ENCOUNTER — Encounter: Payer: Self-pay | Admitting: Oncology

## 2020-11-30 ENCOUNTER — Telehealth: Payer: Self-pay | Admitting: *Deleted

## 2020-11-30 NOTE — Telephone Encounter (Signed)
Printmaker letter attached to e-mail from [employer].    Letter reads:  "Final Reminder" for letter previously sent 10/07/2020 requesting additional medical information from date patient last worked (07/10/2019) going forward by 12/06/2020."      Submitting printed copy of e-mail to [facility] medical records staff today.   Connected with Francesco Sor to append claim: [id number] with the following: "Information requested may not be received by 12/06/2020.  [facility] office unable to provide expected completion date.  Record request for insurance needs are processed through [facility] H.I.M. or [employer].   Provided (SW) H.I.M phone: [phone]) and fax: [phone]) numbers with this call.

## 2020-12-01 ENCOUNTER — Encounter: Payer: Self-pay | Admitting: Oncology

## 2020-12-01 ENCOUNTER — Encounter: Payer: Self-pay | Admitting: *Deleted

## 2020-12-01 ENCOUNTER — Other Ambulatory Visit: Payer: Self-pay | Admitting: Oncology

## 2020-12-01 NOTE — Telephone Encounter (Signed)
   Steve Carter with [employer] reports receipt of message to call  [facility].  Provided 11/30/2020 call information provided to representative below.    Today expressed [facility] was hand written by patient on [place] authorization to disclose information to [place]  Only [name] is not authorized to release [place] records.    "He may be confused about ROI process.  We provided two different authorizations."   Currently denies further questions or needs.

## 2020-12-02 ENCOUNTER — Other Ambulatory Visit: Payer: Self-pay | Admitting: Adult Health

## 2020-12-02 DIAGNOSIS — F411 Generalized anxiety disorder: Secondary | ICD-10-CM

## 2020-12-02 DIAGNOSIS — F331 Major depressive disorder, recurrent, moderate: Secondary | ICD-10-CM

## 2020-12-04 ENCOUNTER — Other Ambulatory Visit: Payer: Self-pay | Admitting: Oncology

## 2020-12-05 ENCOUNTER — Telehealth: Payer: Self-pay | Admitting: Oncology

## 2020-12-05 NOTE — Telephone Encounter (Signed)
Rescheduled appts per 1/3 sch msg. Pt's spouse confirmed new appt date and time.

## 2020-12-06 ENCOUNTER — Encounter: Payer: Self-pay | Admitting: Oncology

## 2020-12-06 ENCOUNTER — Encounter: Payer: Self-pay | Admitting: *Deleted

## 2020-12-06 NOTE — Progress Notes (Signed)
Spoke w/ pt's wife regarding the Urgent Need Adult Fund w/ the [employer].  I completed the online application and pt was approved to receive an annual one-time $500 award.  The check will be mailed in 3 to 7 business days after the approval date.  I informed pt's wife.

## 2020-12-06 NOTE — Progress Notes (Signed)
Forwarded wife's request to apply for assistance with [employer]oma[employer] to [place], Database administrator.

## 2020-12-07 ENCOUNTER — Inpatient Hospital Stay: Payer: Medicare Other | Admitting: Nurse Practitioner

## 2020-12-07 ENCOUNTER — Inpatient Hospital Stay: Payer: Medicare Other

## 2020-12-12 ENCOUNTER — Encounter: Payer: Self-pay | Admitting: *Deleted

## 2020-12-12 NOTE — Progress Notes (Signed)
Received from from [employer] [place] for MD to sign--enrollment application. MD signed and was forwarded to financial advocate, Annamary Rummage. May be a duplicate.

## 2020-12-13 ENCOUNTER — Encounter: Payer: Self-pay | Admitting: Oncology

## 2020-12-13 NOTE — Progress Notes (Signed)
Pt is enrolled w/ LLS to receive assistance for $13,000 toassist w/ins premiums andqualifying treatment expenseseffective1/1/22to1/1/23.

## 2020-12-14 ENCOUNTER — Other Ambulatory Visit: Payer: Self-pay

## 2020-12-14 ENCOUNTER — Inpatient Hospital Stay: Payer: Medicare Other | Attending: Nurse Practitioner

## 2020-12-14 ENCOUNTER — Encounter: Payer: Self-pay | Admitting: Nurse Practitioner

## 2020-12-14 ENCOUNTER — Inpatient Hospital Stay (HOSPITAL_BASED_OUTPATIENT_CLINIC_OR_DEPARTMENT_OTHER): Payer: Medicare Other | Admitting: Nurse Practitioner

## 2020-12-14 ENCOUNTER — Other Ambulatory Visit: Payer: Self-pay | Admitting: Oncology

## 2020-12-14 ENCOUNTER — Inpatient Hospital Stay: Payer: Medicare Other

## 2020-12-14 VITALS — BP 133/82 | HR 66 | Temp 97.1°F | Resp 17 | Ht 74.0 in | Wt 269.0 lb

## 2020-12-14 DIAGNOSIS — C9000 Multiple myeloma not having achieved remission: Secondary | ICD-10-CM

## 2020-12-14 DIAGNOSIS — G893 Neoplasm related pain (acute) (chronic): Secondary | ICD-10-CM | POA: Insufficient documentation

## 2020-12-14 DIAGNOSIS — Z79899 Other long term (current) drug therapy: Secondary | ICD-10-CM | POA: Diagnosis not present

## 2020-12-14 DIAGNOSIS — I6782 Cerebral ischemia: Secondary | ICD-10-CM | POA: Diagnosis not present

## 2020-12-14 DIAGNOSIS — R197 Diarrhea, unspecified: Secondary | ICD-10-CM | POA: Insufficient documentation

## 2020-12-14 DIAGNOSIS — R4182 Altered mental status, unspecified: Secondary | ICD-10-CM | POA: Diagnosis not present

## 2020-12-14 DIAGNOSIS — N289 Disorder of kidney and ureter, unspecified: Secondary | ICD-10-CM | POA: Insufficient documentation

## 2020-12-14 DIAGNOSIS — E119 Type 2 diabetes mellitus without complications: Secondary | ICD-10-CM | POA: Insufficient documentation

## 2020-12-14 DIAGNOSIS — F32A Depression, unspecified: Secondary | ICD-10-CM | POA: Diagnosis not present

## 2020-12-14 DIAGNOSIS — Z5112 Encounter for antineoplastic immunotherapy: Secondary | ICD-10-CM | POA: Diagnosis not present

## 2020-12-14 DIAGNOSIS — I10 Essential (primary) hypertension: Secondary | ICD-10-CM | POA: Insufficient documentation

## 2020-12-14 LAB — CBC WITH DIFFERENTIAL (CANCER CENTER ONLY)
Abs Immature Granulocytes: 0.06 10*3/uL (ref 0.00–0.07)
Basophils Absolute: 0.1 10*3/uL (ref 0.0–0.1)
Basophils Relative: 1 %
Eosinophils Absolute: 0.1 10*3/uL (ref 0.0–0.5)
Eosinophils Relative: 1 %
HCT: 39 % (ref 39.0–52.0)
Hemoglobin: 13.4 g/dL (ref 13.0–17.0)
Immature Granulocytes: 1 %
Lymphocytes Relative: 16 %
Lymphs Abs: 1 10*3/uL (ref 0.7–4.0)
MCH: 31.7 pg (ref 26.0–34.0)
MCHC: 34.4 g/dL (ref 30.0–36.0)
MCV: 92.2 fL (ref 80.0–100.0)
Monocytes Absolute: 0.9 10*3/uL (ref 0.1–1.0)
Monocytes Relative: 14 %
Neutro Abs: 4.1 10*3/uL (ref 1.7–7.7)
Neutrophils Relative %: 67 %
Platelet Count: 202 10*3/uL (ref 150–400)
RBC: 4.23 MIL/uL (ref 4.22–5.81)
RDW: 14.3 % (ref 11.5–15.5)
WBC Count: 6.1 10*3/uL (ref 4.0–10.5)
nRBC: 0 % (ref 0.0–0.2)

## 2020-12-14 LAB — CMP (CANCER CENTER ONLY)
ALT: 44 U/L (ref 0–44)
AST: 22 U/L (ref 15–41)
Albumin: 4.2 g/dL (ref 3.5–5.0)
Alkaline Phosphatase: 56 U/L (ref 38–126)
Anion gap: 10 (ref 5–15)
BUN: 15 mg/dL (ref 6–20)
CO2: 28 mmol/L (ref 22–32)
Calcium: 8.7 mg/dL — ABNORMAL LOW (ref 8.9–10.3)
Chloride: 99 mmol/L (ref 98–111)
Creatinine: 1.47 mg/dL — ABNORMAL HIGH (ref 0.61–1.24)
GFR, Estimated: 57 mL/min — ABNORMAL LOW (ref >60)
Glucose, Bld: 191 mg/dL — ABNORMAL HIGH (ref 70–99)
Potassium: 4 mmol/L (ref 3.5–5.1)
Sodium: 137 mmol/L (ref 135–145)
Total Bilirubin: 0.4 mg/dL (ref 0.3–1.2)
Total Protein: 6.9 g/dL (ref 6.5–8.1)

## 2020-12-14 MED ORDER — DEXAMETHASONE 4 MG PO TABS
20.0000 mg | ORAL_TABLET | Freq: Once | ORAL | Status: AC
  Administered 2020-12-14: 20 mg via ORAL

## 2020-12-14 MED ORDER — DARATUMUMAB-HYALURONIDASE-FIHJ 1800-30000 MG-UT/15ML ~~LOC~~ SOLN
1800.0000 mg | Freq: Once | SUBCUTANEOUS | Status: AC
  Administered 2020-12-14: 1800 mg via SUBCUTANEOUS
  Filled 2020-12-14: qty 15

## 2020-12-14 MED ORDER — DIPHENHYDRAMINE HCL 25 MG PO CAPS
ORAL_CAPSULE | ORAL | Status: AC
  Filled 2020-12-14: qty 2

## 2020-12-14 MED ORDER — DIPHENHYDRAMINE HCL 25 MG PO CAPS
50.0000 mg | ORAL_CAPSULE | Freq: Once | ORAL | Status: AC
  Administered 2020-12-14: 50 mg via ORAL

## 2020-12-14 MED ORDER — DEXAMETHASONE 4 MG PO TABS
ORAL_TABLET | ORAL | Status: AC
  Filled 2020-12-14: qty 5

## 2020-12-14 MED ORDER — ACETAMINOPHEN 325 MG PO TABS
ORAL_TABLET | ORAL | Status: AC
  Filled 2020-12-14: qty 2

## 2020-12-14 MED ORDER — ACETAMINOPHEN 325 MG PO TABS
650.0000 mg | ORAL_TABLET | Freq: Once | ORAL | Status: AC
  Administered 2020-12-14: 650 mg via ORAL

## 2020-12-14 NOTE — Patient Instructions (Signed)
[facility] Discharge Instructions for Patients Receiving Chemotherapy  Today you received the following chemotherapy agents: Darzalex Faspro.  To help prevent nausea and vomiting after your treatment, we encourage you to take your nausea medication as directed.   If you develop nausea and vomiting that is not controlled by your nausea medication, call the clinic.   BELOW ARE SYMPTOMS THAT SHOULD BE REPORTED IMMEDIATELY:  *FEVER GREATER THAN 100.5 F  *CHILLS WITH OR WITHOUT FEVER  NAUSEA AND VOMITING THAT IS NOT CONTROLLED WITH YOUR NAUSEA MEDICATION  *UNUSUAL SHORTNESS OF BREATH  *UNUSUAL BRUISING OR BLEEDING  TENDERNESS IN MOUTH AND THROAT WITH OR WITHOUT PRESENCE OF ULCERS  *URINARY PROBLEMS  *BOWEL PROBLEMS  UNUSUAL RASH Items with * indicate a potential emergency and should be followed up as soon as possible.  Feel free to call the clinic should you have any questions or concerns. The clinic phone number is [phone].   

## 2020-12-14 NOTE — Progress Notes (Signed)
[place] OFFICE PROGRESS NOTE   Diagnosis: Multiple myeloma  INTERVAL HISTORY:   Steve Carter returns as scheduled.  He completed another cycle of daratumumab/Pomalidomide beginning 11/08/2020.  He began a new cycle of Pomalidomide 12/13/2020.  He overall feels well.  He denies nausea/vomiting.  He has occasional loose stools.  No pain.  No rash.  He denies fever, cough, shortness of breath.  Objective:  Vital signs in last 24 hours:  Blood pressure 133/82, pulse 66, temperature (!) 97.1 F (36.2 C), temperature source Tympanic, resp. rate 17, height _0  (1.88 m), weight 269 lb (122 kg), SpO2 99 %.    HEENT: Mild white coating over tongue.  No buccal thrush. Resp: Lungs clear bilaterally. Cardio: Regular rate and rhythm. GI: No hepatosplenomegaly. Vascular: Right lower leg is slightly larger than the left lower leg.    Lab Results:  Lab Results  Component Value Date   WBC 6.1 12/14/2020   HGB 13.4 12/14/2020   HCT 39.0 12/14/2020   MCV 92.2 12/14/2020   PLT 202 12/14/2020   NEUTROABS 4.1 12/14/2020    Imaging:  No results found.  Medications: I have reviewed the patient's current medications.  Assessment/Plan: 1.Multiple myeloma-IgG lambda serum monoclonal protein, elevated serum lambda light chains  Bone survey 10/11/2017-lytic lesions noted in the skull, right iliac, left second rib, and mottled appearance of the proximal femurs/pelvis  Bone marrow biopsy 10/14/2017-hypercellular marrow with plasma cell neoplasm, 82% plasma cells, lambda light chain restricted, hyperdiploid with gains of chromosomes 3, 5, 7, 9, and 11. FISH panel positive for gain of ATM(+11)  Cycle 1 RVD 10/18/2017 (Revlimid started 10/23/2017)  Cycle 2 RVD 11/15/2017  Cycle 3 RVD 12/13/2017  Cycle 4 RVD 01/14/2018  Cycle 5 of RVD 02/14/2018 (Revlimid given for 7 days and 1 week of Velcade), therapy held beginning 02/21/2018 secondary to plan for stem cell  therapy  Bone marrow biopsy 02/20/2018, 1-2% plasma cell  PET scan 02/20/2018, no malignant range activity above background, numerous lytic lesions throughout the axial and appendicular skeleton,, left iliac wing fracture  Melphalan, 200 mg/m on 03/24/2018  Stem cell infusion 03/25/2018  Restaging at [facility] 07/09/2018: Normal lambda light chains, no serum M spike, bone marrow biopsy negative for myeloma, less than 1% plasma cells  PET scan at [facility] 07/09/2018-lytic bone lesions, no malignant range activity  Initiation of maintenance Revlimid, 10 mg, 21/28 days 08/15/2018  Cycle 2 maintenance Revlimid 09/12/2018  Cycle 3 maintenance Revlimid 10/10/2018  Revlimid placed on hold 06/15/2019 due to presyncopal/syncopal episodes and diarrhea  Revlimid resumed 5 mg 21 days on/7 days off following office visit 06/29/2019  Revlimidplaced on hold 07/14/2019  Revlimid resumed 08/21/2019  Bone survey 09/09/2019-no acute findings or clear explanation for right buttock pain. Lucent lesions in the right pelvis are stable without pathologic fracture. Evidence of healing lytic lesions in the right scapula, cervical spine spinous processes and left L4 transverse process. Possible mild progression of lytic lesions within the L1 and L4 vertebral bodies. Stable lytic lesions in the calvarium.  PET scan 09/25/2019-new FDG avid bone lesions in the right humeral neck and sacrum similar remaining lytic lesions with FDG activity below background  Bone marrow biopsy at [facility] on 10/01/2019-5% plasma cells on the bone marrow biopsy suboptimal sample  Cycle 1 daratumumab, pomalidomide, Decadron 10/19/2019  Zometa 10/19/2019  Cycle 2 daratumumab, pomalidomide, Decadron 11/16/2019 (he will begin the pomalidomide 11/17/2019)  Cycle 3 daratumumab, pomalidomide, and Decadron 12/14/2019  Cycle 4 daratumumab,  pomalidomide, and Decadron 01/11/2020  Cycle 5 daratumumab, pomalidomide, Decadron 02/03/2020  (he began pomalidomide on 02/06/2020)  Cycle 6 daratumumab, pomalidomide, Decadron 03/02/2020 (pomalidomide starting 03/12/2020)  Cycle 7 daratumumab, pomalidomide, Decadron 03/29/2020 (pomalidomide scheduled to start 04/15/2020)  Cycle 8 daratumumab, pomalidomide, Decadron 05/04/2020 (pomalidomide started 05/14/2020)  Cycle 9 daratumumab-monthly 06/15/2020, pomalidomide held  Cycle 10 daratumumab 07/13/2020, pomalidomide 2 mg 21 days beginning 07/15/2020  Cycle 11 daratumumab 08/10/2020, pomalidomide 2 mg 21 days beginning 08/12/2020  Bone marrow biopsy 09/06/2020-hypocellular bone marrow with a relative erythroid hyperplasia and no increase in plasma cells; MRD 0.0050%  Cycle 12 daratumumab10/05/2020, pomalidomide 2 mg 21 days beginning 09/09/2020  Cycle 13 daratumumab 10/11/2020, pomalidomide 2 mg 21 days beginning 10/10/2020  Cycle 14 daratumumab 11/08/2020, pomalidomide reduced to 1 mg 21 days  Cycle 15 daratumumab 12/14/2020, Pomalidomide 1 mg 21 days beginning 12/13/2020   2.Pain secondary to multiple myeloma involving the spine and pelvis-resolved  MRI of the lumbar spine 10/02/2018-numerous rounded foci in the vertebral bodies, hypertrophy of the L4 transverse process  3.Hypertension-losartan dose increased 11/15/2017  4.Depression-improved with Wellbutrin beginning September 2020  5.Altered mental status-improved depression related? 6.Diabetes 7. Recurrent episodes of fall/syncope-etiology unclear-evaluated by neurology at [facility], MRI brain 01/25/2020 with abnormal signal at the left superior frontal gyrus-potentially representing a low-grade glioma, scattered foci of increased T2 white matter signal-likely related to chronic small vessel disease  Stereotactic brain biopsy at [facility] 03/14/2020-gliosis, no evidence of malignancy  8.Peripheral neuropathy-gabapentin started 07/07/2020  9.Renal insufficiency   Disposition: Mr. Rutigliano appears stable.  He is  on active treatment with daratumumab and pomalidomide.  He is tolerating well.  Plan to continue the same.  We will follow-up on the myeloma labs from today.  We reviewed the CBC from today.  Labs adequate to continue as above.  He has follow-up at [facility] in approximately 2 weeks.  He will return for follow-up and daratumumab in 1 month.  He will contact the office in the interim with any problems.    [name] ANP/GNP-BC   12/14/2020  1:55 PM

## 2020-12-15 ENCOUNTER — Telehealth: Payer: Self-pay | Admitting: Nurse Practitioner

## 2020-12-15 LAB — KAPPA/LAMBDA LIGHT CHAINS
Kappa free light chain: 7.3 mg/L (ref 3.3–19.4)
Kappa, lambda light chain ratio: 0.72 (ref 0.26–1.65)
Lambda free light chains: 10.1 mg/L (ref 5.7–26.3)

## 2020-12-15 LAB — IGG: IgG (Immunoglobin G), Serum: 331 mg/dL — ABNORMAL LOW (ref 603–1613)

## 2020-12-15 NOTE — Telephone Encounter (Signed)
Scheduled appointments per 1/12 los. Spoke to patient's wife who is aware of appointments date and times.

## 2021-01-01 ENCOUNTER — Other Ambulatory Visit: Payer: Self-pay | Admitting: Oncology

## 2021-01-10 ENCOUNTER — Other Ambulatory Visit: Payer: Self-pay | Admitting: Oncology

## 2021-01-12 ENCOUNTER — Inpatient Hospital Stay: Payer: Medicare Other

## 2021-01-12 ENCOUNTER — Other Ambulatory Visit: Payer: Self-pay

## 2021-01-12 ENCOUNTER — Inpatient Hospital Stay: Payer: Medicare Other | Attending: Oncology

## 2021-01-12 ENCOUNTER — Inpatient Hospital Stay (HOSPITAL_BASED_OUTPATIENT_CLINIC_OR_DEPARTMENT_OTHER): Payer: Medicare Other | Admitting: Oncology

## 2021-01-12 VITALS — BP 131/89 | HR 63 | Temp 97.0°F | Resp 17 | Ht 74.0 in | Wt 271.4 lb

## 2021-01-12 DIAGNOSIS — E119 Type 2 diabetes mellitus without complications: Secondary | ICD-10-CM | POA: Insufficient documentation

## 2021-01-12 DIAGNOSIS — Z5112 Encounter for antineoplastic immunotherapy: Secondary | ICD-10-CM | POA: Insufficient documentation

## 2021-01-12 DIAGNOSIS — F32A Depression, unspecified: Secondary | ICD-10-CM | POA: Insufficient documentation

## 2021-01-12 DIAGNOSIS — Z79899 Other long term (current) drug therapy: Secondary | ICD-10-CM | POA: Diagnosis not present

## 2021-01-12 DIAGNOSIS — N289 Disorder of kidney and ureter, unspecified: Secondary | ICD-10-CM | POA: Diagnosis not present

## 2021-01-12 DIAGNOSIS — C9000 Multiple myeloma not having achieved remission: Secondary | ICD-10-CM | POA: Diagnosis present

## 2021-01-12 DIAGNOSIS — G620 Drug-induced polyneuropathy: Secondary | ICD-10-CM | POA: Diagnosis not present

## 2021-01-12 DIAGNOSIS — I6782 Cerebral ischemia: Secondary | ICD-10-CM | POA: Insufficient documentation

## 2021-01-12 DIAGNOSIS — I10 Essential (primary) hypertension: Secondary | ICD-10-CM | POA: Insufficient documentation

## 2021-01-12 DIAGNOSIS — G893 Neoplasm related pain (acute) (chronic): Secondary | ICD-10-CM | POA: Insufficient documentation

## 2021-01-12 LAB — CBC WITH DIFFERENTIAL (CANCER CENTER ONLY)
Abs Immature Granulocytes: 0.05 10*3/uL (ref 0.00–0.07)
Basophils Absolute: 0.1 10*3/uL (ref 0.0–0.1)
Basophils Relative: 2 %
Eosinophils Absolute: 0.1 10*3/uL (ref 0.0–0.5)
Eosinophils Relative: 1 %
HCT: 36.2 % — ABNORMAL LOW (ref 39.0–52.0)
Hemoglobin: 12.7 g/dL — ABNORMAL LOW (ref 13.0–17.0)
Immature Granulocytes: 1 %
Lymphocytes Relative: 18 %
Lymphs Abs: 1 10*3/uL (ref 0.7–4.0)
MCH: 31.9 pg (ref 26.0–34.0)
MCHC: 35.1 g/dL (ref 30.0–36.0)
MCV: 91 fL (ref 80.0–100.0)
Monocytes Absolute: 0.9 10*3/uL (ref 0.1–1.0)
Monocytes Relative: 17 %
Neutro Abs: 3.2 10*3/uL (ref 1.7–7.7)
Neutrophils Relative %: 61 %
Platelet Count: 225 10*3/uL (ref 150–400)
RBC: 3.98 MIL/uL — ABNORMAL LOW (ref 4.22–5.81)
RDW: 13.6 % (ref 11.5–15.5)
WBC Count: 5.3 10*3/uL (ref 4.0–10.5)
nRBC: 0 % (ref 0.0–0.2)

## 2021-01-12 LAB — CMP (CANCER CENTER ONLY)
ALT: 30 U/L (ref 0–44)
AST: 22 U/L (ref 15–41)
Albumin: 4.1 g/dL (ref 3.5–5.0)
Alkaline Phosphatase: 54 U/L (ref 38–126)
Anion gap: 9 (ref 5–15)
BUN: 17 mg/dL (ref 6–20)
CO2: 26 mmol/L (ref 22–32)
Calcium: 9 mg/dL (ref 8.9–10.3)
Chloride: 101 mmol/L (ref 98–111)
Creatinine: 1.42 mg/dL — ABNORMAL HIGH (ref 0.61–1.24)
GFR, Estimated: 59 mL/min — ABNORMAL LOW (ref >60)
Glucose, Bld: 170 mg/dL — ABNORMAL HIGH (ref 70–99)
Potassium: 4.5 mmol/L (ref 3.5–5.1)
Sodium: 136 mmol/L (ref 135–145)
Total Bilirubin: <0.2 mg/dL — ABNORMAL LOW (ref 0.3–1.2)
Total Protein: 6.5 g/dL (ref 6.5–8.1)

## 2021-01-12 MED ORDER — DEXAMETHASONE 4 MG PO TABS
ORAL_TABLET | ORAL | Status: AC
  Filled 2021-01-12: qty 5

## 2021-01-12 MED ORDER — ACETAMINOPHEN 325 MG PO TABS
ORAL_TABLET | ORAL | Status: AC
  Filled 2021-01-12: qty 2

## 2021-01-12 MED ORDER — DARATUMUMAB-HYALURONIDASE-FIHJ 1800-30000 MG-UT/15ML ~~LOC~~ SOLN
1800.0000 mg | Freq: Once | SUBCUTANEOUS | Status: AC
  Administered 2021-01-12: 1800 mg via SUBCUTANEOUS
  Filled 2021-01-12: qty 15

## 2021-01-12 MED ORDER — ACETAMINOPHEN 325 MG PO TABS
650.0000 mg | ORAL_TABLET | Freq: Once | ORAL | Status: AC
  Administered 2021-01-12: 650 mg via ORAL

## 2021-01-12 MED ORDER — DEXAMETHASONE 4 MG PO TABS
20.0000 mg | ORAL_TABLET | Freq: Once | ORAL | Status: AC
  Administered 2021-01-12: 20 mg via ORAL

## 2021-01-12 MED ORDER — DIPHENHYDRAMINE HCL 25 MG PO CAPS
50.0000 mg | ORAL_CAPSULE | Freq: Once | ORAL | Status: AC
  Administered 2021-01-12: 50 mg via ORAL

## 2021-01-12 MED ORDER — DIPHENHYDRAMINE HCL 25 MG PO CAPS
ORAL_CAPSULE | ORAL | Status: AC
  Filled 2021-01-12: qty 2

## 2021-01-12 NOTE — Progress Notes (Signed)
[place] OFFICE PROGRESS NOTE   Diagnosis: Multiple myeloma  INTERVAL HISTORY:   Steve Carter returns as scheduled. He continues monthly daratumumab. He started another cycle of pomalidomide yesterday. No new complaint. No pain. Good appetite. He is getting out of the house. He saw Dr. Marcell Anger on 12/26/2020. He recommends continuing the current treatment. The light chains return normal and no M spike was detected. No falls. Objective:  Vital signs in last 24 hours:  Blood pressure 131/89, pulse 63, temperature (!) 97 F (36.1 C), temperature source Tympanic, resp. rate 17, height $RemoveBe'6\' 2"'eysNqEtOY$  (1.88 m), weight 271 lb 6.4 oz (123.1 kg), SpO2 99 %.    HEENT: No thrush Resp: Lungs clear bilaterally Cardio: Regular rate and rhythm GI: No hepatosplenomegaly Vascular: No leg edema   Portacath/PICC-without erythema  Lab Results:  Lab Results  Component Value Date   WBC 5.3 01/12/2021   HGB 12.7 (L) 01/12/2021   HCT 36.2 (L) 01/12/2021   MCV 91.0 01/12/2021   PLT 225 01/12/2021   NEUTROABS 3.2 01/12/2021    CMP  Lab Results  Component Value Date   NA 136 01/12/2021   K 4.5 01/12/2021   CL 101 01/12/2021   CO2 26 01/12/2021   GLUCOSE 170 (H) 01/12/2021   BUN 17 01/12/2021   CREATININE 1.42 (H) 01/12/2021   CALCIUM 9.0 01/12/2021   PROT 6.5 01/12/2021   ALBUMIN 4.1 01/12/2021   AST 22 01/12/2021   ALT 30 01/12/2021   ALKPHOS 54 01/12/2021   BILITOT <0.2 (L) 01/12/2021   GFRNONAA 59 (L) 01/12/2021   GFRAA >60 08/10/2020     Medications: I have reviewed the patient's current medications.   Assessment/Plan: 1.  Multiple myeloma-IgG lambda serum monoclonal protein, elevated serum lambda light chains  Bone survey 10/11/2017-lytic lesions noted in the skull, right iliac, left second rib, and mottled appearance of the proximal femurs/pelvis  Bone marrow biopsy 10/14/2017-hypercellular marrow with plasma cell neoplasm, 82% plasma cells, lambda light chain  restricted, hyperdiploid with gains of chromosomes 3, 5, 7, 9, and 11. FISH panel positive for gain of ATM(+11)  Cycle 1 RVD 10/18/2017 (Revlimid started 10/23/2017)  Cycle 2 RVD 11/15/2017  Cycle 3 RVD 12/13/2017  Cycle 4 RVD 01/14/2018  Cycle 5 of RVD 02/14/2018 (Revlimid given for 7 days and 1 week of Velcade), therapy held beginning 02/21/2018 secondary to plan for stem cell therapy  Bone marrow biopsy 02/20/2018, 1-2% plasma cell  PET scan 02/20/2018, no malignant range activity above background, numerous lytic lesions throughout the axial and appendicular skeleton,, left iliac wing fracture  Melphalan, 200 mg/m on 03/24/2018  Stem cell infusion 03/25/2018  Restaging at [facility] 07/09/2018: Normal lambda light chains, no serum M spike, bone marrow biopsy negative for myeloma, less than 1% plasma cells  PET scan at [facility] 07/09/2018-lytic bone lesions, no malignant range activity  Initiation of maintenance Revlimid, 10 mg, 21/28 days 08/15/2018  Cycle 2 maintenance Revlimid 09/12/2018  Cycle 3 maintenance Revlimid 10/10/2018  Revlimid placed on hold 06/15/2019 due to presyncopal/syncopal episodes and diarrhea  Revlimid resumed 5 mg 21 days on/7 days off following office visit 06/29/2019  Revlimidplaced on hold 07/14/2019  Revlimid resumed 08/21/2019  Bone survey 09/09/2019-no acute findings or clear explanation for right buttock pain. Lucent lesions in the right pelvis are stable without pathologic fracture. Evidence of healing lytic lesions in the right scapula, cervical spine spinous processes and left L4 transverse process. Possible mild progression of lytic lesions within the L1 and  L4 vertebral bodies. Stable lytic lesions in the calvarium.  PET scan 09/25/2019-new FDG avid bone lesions in the right humeral neck and sacrum similar remaining lytic lesions with FDG activity below background  Bone marrow biopsy at [facility] on 10/01/2019-5% plasma cells on the bone  marrow biopsy suboptimal sample  Cycle 1 daratumumab, pomalidomide, Decadron 10/19/2019  Zometa 10/19/2019  Cycle 2 daratumumab, pomalidomide, Decadron 11/16/2019 (he will begin the pomalidomide 11/17/2019)  Cycle 3 daratumumab, pomalidomide, and Decadron 12/14/2019  Cycle 4 daratumumab, pomalidomide, and Decadron 01/11/2020  Cycle 5 daratumumab, pomalidomide, Decadron 02/03/2020 (he began pomalidomide on 02/06/2020)  Cycle 6 daratumumab, pomalidomide, Decadron 03/02/2020 (pomalidomide starting 03/12/2020)  Cycle 7 daratumumab, pomalidomide, Decadron 03/29/2020 (pomalidomide scheduled to start 04/15/2020)  Cycle 8 daratumumab, pomalidomide, Decadron 05/04/2020 (pomalidomide started 05/14/2020)  Cycle 9 daratumumab-monthly 06/15/2020, pomalidomide held  Cycle 10 daratumumab 07/13/2020, pomalidomide 2 mg 21 days beginning 07/15/2020  Cycle 11 daratumumab 08/10/2020, pomalidomide 2 mg 21 days beginning 08/12/2020  Bone marrow biopsy 09/06/2020-hypocellular bone marrow with a relative erythroid hyperplasia and no increase in plasma cells; MRD 0.0050%  Cycle 12 daratumumab10/05/2020, pomalidomide 2 mg 21 days beginning 09/09/2020  Cycle 13 daratumumab 10/11/2020, pomalidomide 2 mg 21 days beginning 10/10/2020  Cycle 14 daratumumab 11/08/2020, pomalidomide reduced to 1 mg 21 days  Cycle 15 daratumumab 12/14/2020, Pomalidomide 1 mg 21 days beginning 12/13/2020  Cycle 16 daratumumab 01/12/2021, pomalidomide 1 mg 21 days beginning 01/11/2021   2.Pain secondary to multiple myeloma involving the spine and pelvis-resolved  MRI of the lumbar spine 10/02/2018-numerous rounded foci in the vertebral bodies, hypertrophy of the L4 transverse process  3.Hypertension-losartan dose increased 11/15/2017  4.Depression-improved with Wellbutrin beginning September 2020  5.Altered mental status-improved depression related? 6.Diabetes 7. Recurrent episodes of fall/syncope-etiology unclear-evaluated by  neurology at [facility], MRI brain 01/25/2020 with abnormal signal at the left superior frontal gyrus-potentially representing a low-grade glioma, scattered foci of increased T2 white matter signal-likely related to chronic small vessel disease  Stereotactic brain biopsy at [facility] 03/14/2020-gliosis, no evidence of malignancy  8.Peripheral neuropathy-gabapentin started 07/07/2020  9.Renal insufficiency     Disposition: Steve Carter appears stable. He will complete another treatment with daratumumab today. He started another cycle of pomalidomide yesterday. He will return for an office visit, Zometa, and daratumumab in 1 month. We will repeat a myeloma panel when he is here next month.  Betsy Coder, MD  01/12/2021  12:02 PM

## 2021-01-12 NOTE — Patient Instructions (Signed)
[facility] Discharge Instructions for Patients Receiving Chemotherapy  Today you received the following chemotherapy agents: Darzalex Faspro  To help prevent nausea and vomiting after your treatment, we encourage you to take your nausea medication  as prescribed.    If you develop nausea and vomiting that is not controlled by your nausea medication, call the clinic.   BELOW ARE SYMPTOMS THAT SHOULD BE REPORTED IMMEDIATELY:  *FEVER GREATER THAN 100.5 F  *CHILLS WITH OR WITHOUT FEVER  NAUSEA AND VOMITING THAT IS NOT CONTROLLED WITH YOUR NAUSEA MEDICATION  *UNUSUAL SHORTNESS OF BREATH  *UNUSUAL BRUISING OR BLEEDING  TENDERNESS IN MOUTH AND THROAT WITH OR WITHOUT PRESENCE OF ULCERS  *URINARY PROBLEMS  *BOWEL PROBLEMS  UNUSUAL RASH Items with * indicate a potential emergency and should be followed up as soon as possible.  Feel free to call the clinic should you have any questions or concerns. The clinic phone number is [phone].  Please show the CHEMO ALERT CARD at check-in to the Emergency Department and triage nurse.   

## 2021-01-13 ENCOUNTER — Telehealth: Payer: Self-pay | Admitting: Oncology

## 2021-01-13 NOTE — Telephone Encounter (Signed)
Scheduled appointments per 2/10 los. Spoke to [place], patient's wife, who is aware of appointment date and times.

## 2021-01-30 ENCOUNTER — Other Ambulatory Visit: Payer: Self-pay | Admitting: Oncology

## 2021-02-05 ENCOUNTER — Other Ambulatory Visit: Payer: Self-pay | Admitting: Oncology

## 2021-02-10 ENCOUNTER — Inpatient Hospital Stay: Payer: Medicare Other | Attending: Nurse Practitioner

## 2021-02-10 ENCOUNTER — Inpatient Hospital Stay: Payer: Medicare Other

## 2021-02-10 ENCOUNTER — Encounter: Payer: Self-pay | Admitting: Nurse Practitioner

## 2021-02-10 ENCOUNTER — Inpatient Hospital Stay (HOSPITAL_BASED_OUTPATIENT_CLINIC_OR_DEPARTMENT_OTHER): Payer: Medicare Other | Admitting: Nurse Practitioner

## 2021-02-10 ENCOUNTER — Other Ambulatory Visit: Payer: Self-pay

## 2021-02-10 VITALS — BP 127/81 | HR 73 | Temp 97.8°F | Resp 18 | Ht 74.0 in | Wt 272.4 lb

## 2021-02-10 DIAGNOSIS — E119 Type 2 diabetes mellitus without complications: Secondary | ICD-10-CM | POA: Diagnosis not present

## 2021-02-10 DIAGNOSIS — N289 Disorder of kidney and ureter, unspecified: Secondary | ICD-10-CM | POA: Diagnosis not present

## 2021-02-10 DIAGNOSIS — Z79899 Other long term (current) drug therapy: Secondary | ICD-10-CM | POA: Insufficient documentation

## 2021-02-10 DIAGNOSIS — Z5112 Encounter for antineoplastic immunotherapy: Secondary | ICD-10-CM | POA: Insufficient documentation

## 2021-02-10 DIAGNOSIS — C9000 Multiple myeloma not having achieved remission: Secondary | ICD-10-CM | POA: Diagnosis present

## 2021-02-10 DIAGNOSIS — F32A Depression, unspecified: Secondary | ICD-10-CM | POA: Insufficient documentation

## 2021-02-10 DIAGNOSIS — R197 Diarrhea, unspecified: Secondary | ICD-10-CM | POA: Diagnosis not present

## 2021-02-10 DIAGNOSIS — I6782 Cerebral ischemia: Secondary | ICD-10-CM | POA: Insufficient documentation

## 2021-02-10 DIAGNOSIS — G893 Neoplasm related pain (acute) (chronic): Secondary | ICD-10-CM | POA: Diagnosis not present

## 2021-02-10 DIAGNOSIS — I10 Essential (primary) hypertension: Secondary | ICD-10-CM | POA: Insufficient documentation

## 2021-02-10 LAB — CMP (CANCER CENTER ONLY)
ALT: 34 U/L (ref 0–44)
AST: 21 U/L (ref 15–41)
Albumin: 4.2 g/dL (ref 3.5–5.0)
Alkaline Phosphatase: 55 U/L (ref 38–126)
Anion gap: 13 (ref 5–15)
BUN: 18 mg/dL (ref 6–20)
CO2: 23 mmol/L (ref 22–32)
Calcium: 8.9 mg/dL (ref 8.9–10.3)
Chloride: 100 mmol/L (ref 98–111)
Creatinine: 1.38 mg/dL — ABNORMAL HIGH (ref 0.61–1.24)
GFR, Estimated: >60 mL/min (ref >60)
Glucose, Bld: 218 mg/dL — ABNORMAL HIGH (ref 70–99)
Potassium: 3.9 mmol/L (ref 3.5–5.1)
Sodium: 136 mmol/L (ref 135–145)
Total Bilirubin: 0.2 mg/dL — ABNORMAL LOW (ref 0.3–1.2)
Total Protein: 6.8 g/dL (ref 6.5–8.1)

## 2021-02-10 LAB — CBC WITH DIFFERENTIAL (CANCER CENTER ONLY)
Abs Immature Granulocytes: 0.08 10*3/uL — ABNORMAL HIGH (ref 0.00–0.07)
Basophils Absolute: 0.1 10*3/uL (ref 0.0–0.1)
Basophils Relative: 1 %
Eosinophils Absolute: 0.1 10*3/uL (ref 0.0–0.5)
Eosinophils Relative: 2 %
HCT: 38.6 % — ABNORMAL LOW (ref 39.0–52.0)
Hemoglobin: 13.7 g/dL (ref 13.0–17.0)
Immature Granulocytes: 1 %
Lymphocytes Relative: 14 %
Lymphs Abs: 0.8 10*3/uL (ref 0.7–4.0)
MCH: 32 pg (ref 26.0–34.0)
MCHC: 35.5 g/dL (ref 30.0–36.0)
MCV: 90.2 fL (ref 80.0–100.0)
Monocytes Absolute: 0.7 10*3/uL (ref 0.1–1.0)
Monocytes Relative: 13 %
Neutro Abs: 3.8 10*3/uL (ref 1.7–7.7)
Neutrophils Relative %: 69 %
Platelet Count: 259 10*3/uL (ref 150–400)
RBC: 4.28 MIL/uL (ref 4.22–5.81)
RDW: 13.7 % (ref 11.5–15.5)
WBC Count: 5.5 10*3/uL (ref 4.0–10.5)
nRBC: 0 % (ref 0.0–0.2)

## 2021-02-10 MED ORDER — ZOLEDRONIC ACID 4 MG/100ML IV SOLN
INTRAVENOUS | Status: AC
  Filled 2021-02-10: qty 100

## 2021-02-10 MED ORDER — ZOLEDRONIC ACID 4 MG/100ML IV SOLN
4.0000 mg | Freq: Once | INTRAVENOUS | Status: AC
  Administered 2021-02-10: 4 mg via INTRAVENOUS

## 2021-02-10 MED ORDER — DEXAMETHASONE 4 MG PO TABS
20.0000 mg | ORAL_TABLET | Freq: Once | ORAL | Status: AC
  Administered 2021-02-10: 20 mg via ORAL

## 2021-02-10 MED ORDER — ACETAMINOPHEN 325 MG PO TABS
650.0000 mg | ORAL_TABLET | Freq: Once | ORAL | Status: AC
  Administered 2021-02-10: 650 mg via ORAL

## 2021-02-10 MED ORDER — DIPHENHYDRAMINE HCL 25 MG PO CAPS
ORAL_CAPSULE | ORAL | Status: AC
  Filled 2021-02-10: qty 2

## 2021-02-10 MED ORDER — DIPHENHYDRAMINE HCL 25 MG PO CAPS
50.0000 mg | ORAL_CAPSULE | Freq: Once | ORAL | Status: AC
  Administered 2021-02-10: 50 mg via ORAL

## 2021-02-10 MED ORDER — DEXAMETHASONE 4 MG PO TABS
ORAL_TABLET | ORAL | Status: AC
  Filled 2021-02-10: qty 5

## 2021-02-10 MED ORDER — DARATUMUMAB-HYALURONIDASE-FIHJ 1800-30000 MG-UT/15ML ~~LOC~~ SOLN
1800.0000 mg | Freq: Once | SUBCUTANEOUS | Status: AC
  Administered 2021-02-10: 1800 mg via SUBCUTANEOUS
  Filled 2021-02-10: qty 15

## 2021-02-10 MED ORDER — DIPHENHYDRAMINE HCL 25 MG PO CAPS
ORAL_CAPSULE | ORAL | Status: AC
  Filled 2021-02-10: qty 1

## 2021-02-10 MED ORDER — ACETAMINOPHEN 325 MG PO TABS
ORAL_TABLET | ORAL | Status: AC
  Filled 2021-02-10: qty 2

## 2021-02-10 MED ORDER — SODIUM CHLORIDE 0.9 % IV SOLN
Freq: Once | INTRAVENOUS | Status: AC
  Filled 2021-02-10: qty 250

## 2021-02-10 NOTE — Progress Notes (Signed)
[place] OFFICE PROGRESS NOTE   Diagnosis: [place] myeloma  INTERVAL HISTORY:   Steve Carter returns as scheduled.  He continues monthly daratumumab.  He feels well.  No nausea or vomiting.  No mouth sores.  Intermittent mild diarrhea.  No rash.  He denies pain.  He has a good appetite.  Objective:  Vital signs in last 24 hours:  Blood pressure 127/81, pulse 73, temperature 97.8 F (36.6 C), temperature source Tympanic, resp. rate 18, height $RemoveBe'6\' 2"'GBOmICtLf$  (1.88 m), weight 272 lb 6.4 oz (123.6 kg), SpO2 97 %.    HEENT: No thrush or ulcers. Resp: Lungs clear bilaterally. Cardio: Regular rate and rhythm. GI: No hepatosplenomegaly. Vascular: No leg edema.  Skin: No rash. Port-A-Cath without erythema.   Lab Results:  Lab Results  Component Value Date   WBC 5.5 02/10/2021   HGB 13.7 02/10/2021   HCT 38.6 (L) 02/10/2021   MCV 90.2 02/10/2021   PLT 259 02/10/2021   NEUTROABS 3.8 02/10/2021    Imaging:  No results found.  Medications: I have reviewed the patient's current medications.  Assessment/Plan: 1.  Multiple myeloma-IgG lambda serum monoclonal protein, elevated serum lambda light chains  Bone survey 10/11/2017-lytic lesions noted in the skull, right iliac, left second rib, and mottled appearance of the proximal femurs/pelvis  Bone marrow biopsy 10/14/2017-hypercellular marrow with plasma cell neoplasm, 82% plasma cells, lambda light chain restricted, hyperdiploid with gains of chromosomes 3, 5, 7, 9, and 11. FISH panel positive for gain of ATM(+11)  Cycle 1 RVD 10/18/2017 (Revlimid started 10/23/2017)  Cycle 2 RVD 11/15/2017  Cycle 3 RVD 12/13/2017  Cycle 4 RVD 01/14/2018  Cycle 5 of RVD 02/14/2018 (Revlimid given for 7 days and 1 week of Velcade), therapy held beginning 02/21/2018 secondary to plan for stem cell therapy  Bone marrow biopsy 02/20/2018, 1-2% plasma cell  PET scan 02/20/2018, no malignant range activity above background, numerous  lytic lesions throughout the axial and appendicular skeleton,, left iliac wing fracture  Melphalan, 200 mg/m on 03/24/2018  Stem cell infusion 03/25/2018  Restaging at [facility] 07/09/2018: Normal lambda light chains, no serum M spike, bone marrow biopsy negative for myeloma, less than 1% plasma cells  PET scan at [facility] 07/09/2018-lytic bone lesions, no malignant range activity  Initiation of maintenance Revlimid, 10 mg, 21/28 days 08/15/2018  Cycle 2 maintenance Revlimid 09/12/2018  Cycle 3 maintenance Revlimid 10/10/2018  Revlimid placed on hold 06/15/2019 due to presyncopal/syncopal episodes and diarrhea  Revlimid resumed 5 mg 21 days on/7 days off following office visit 06/29/2019  Revlimidplaced on hold 07/14/2019  Revlimid resumed 08/21/2019  Bone survey 09/09/2019-no acute findings or clear explanation for right buttock pain. Lucent lesions in the right pelvis are stable without pathologic fracture. Evidence of healing lytic lesions in the right scapula, cervical spine spinous processes and left L4 transverse process. Possible mild progression of lytic lesions within the L1 and L4 vertebral bodies. Stable lytic lesions in the calvarium.  PET scan 09/25/2019-new FDG avid bone lesions in the right humeral neck and sacrum similar remaining lytic lesions with FDG activity below background  Bone marrow biopsy at [facility] on 10/01/2019-5% plasma cells on the bone marrow biopsy suboptimal sample  Cycle 1 daratumumab, pomalidomide, Decadron 10/19/2019  Zometa 10/19/2019  Cycle 2 daratumumab, pomalidomide, Decadron 11/16/2019 (he will begin the pomalidomide 11/17/2019)  Cycle 3 daratumumab, pomalidomide, and Decadron 12/14/2019  Cycle 4 daratumumab, pomalidomide, and Decadron 01/11/2020  Cycle 5 daratumumab, pomalidomide, Decadron 02/03/2020 (he began pomalidomide on  02/06/2020)  Cycle 6 daratumumab, pomalidomide, Decadron 03/02/2020 (pomalidomide starting 03/12/2020)  Cycle 7  daratumumab, pomalidomide, Decadron 03/29/2020 (pomalidomide scheduled to start 04/15/2020)  Cycle 8 daratumumab, pomalidomide, Decadron 05/04/2020 (pomalidomide started 05/14/2020)  Cycle 9 daratumumab-monthly 06/15/2020, pomalidomide held  Cycle 10 daratumumab 07/13/2020, pomalidomide 2 mg 21 days beginning 07/15/2020  Cycle 11 daratumumab 08/10/2020, pomalidomide 2 mg 21 days beginning 08/12/2020  Bone marrow biopsy 09/06/2020-hypocellular bone marrow with a relative erythroid hyperplasia and no increase in plasma cells; MRD 0.0050%  Cycle 12 daratumumab10/05/2020, pomalidomide 2 mg 21 days beginning 09/09/2020  Cycle 13 daratumumab 10/11/2020, pomalidomide 2 mg 21 days beginning 10/10/2020  Cycle 14 daratumumab 11/08/2020, pomalidomide reduced to 1 mg 21 days  Cycle 15 daratumumab 12/14/2020, Pomalidomide 1 mg 21 days beginning 12/13/2020  Cycle 16 daratumumab 01/12/2021, pomalidomide 1 mg 21 days beginning 01/11/2021  Cycle 17 daratumumab 02/10/2021, Pomalidomide 1 mg 21 days / 7-day break 02/09/2021   2.Pain secondary to multiple myeloma involving the spine and pelvis-resolved  MRI of the lumbar spine 10/02/2018-numerous rounded foci in the vertebral bodies, hypertrophy of the L4 transverse process  3.Hypertension-losartan dose increased 11/15/2017  4.Depression-improved with Wellbutrin beginning September 2020  5.Altered mental status-improved depression related? 6.Diabetes 7. Recurrent episodes of fall/syncope-etiology unclear-evaluated by neurology at [facility], MRI brain 01/25/2020 with abnormal signal at the left superior frontal gyrus-potentially representing a low-grade glioma, scattered foci of increased T2 white matter signal-likely related to chronic small vessel disease  Stereotactic brain biopsy at [facility] 03/14/2020-gliosis, no evidence of malignancy  8.Peripheral neuropathy-gabapentin started 07/07/2020  9.Renal insufficiency   Disposition: [name] appears stable.  He began a cycle of Pomalidomide yesterday.  He will complete a treatment of monthly daratumumab and every 3-month Zometa today.  We will follow-up on the myeloma panel from today.  We reviewed the CBC from today.  Counts adequate to proceed as above.  Chemistry panel is pending.  He will return for lab, follow-up, daratumumab in 4 weeks.    [name] ANP/GNP-BC   02/10/2021  9:41 AM

## 2021-02-10 NOTE — Patient Instructions (Signed)
[facility] Discharge Instructions for Patients Receiving Chemotherapy  Today you received the following chemotherapy agents: daratumumab.  To help prevent nausea and vomiting after your treatment, we encourage you to take your nausea medication as directed.   If you develop nausea and vomiting that is not controlled by your nausea medication, call the clinic.   BELOW ARE SYMPTOMS THAT SHOULD BE REPORTED IMMEDIATELY:  *FEVER GREATER THAN 100.5 F  *CHILLS WITH OR WITHOUT FEVER  NAUSEA AND VOMITING THAT IS NOT CONTROLLED WITH YOUR NAUSEA MEDICATION  *UNUSUAL SHORTNESS OF BREATH  *UNUSUAL BRUISING OR BLEEDING  TENDERNESS IN MOUTH AND THROAT WITH OR WITHOUT PRESENCE OF ULCERS  *URINARY PROBLEMS  *BOWEL PROBLEMS  UNUSUAL RASH Items with * indicate a potential emergency and should be followed up as soon as possible.  Feel free to call the clinic should you have any questions or concerns. The clinic phone number is [phone].  Please show the CHEMO ALERT CARD at check-in to the Emergency Department and triage nurse.   

## 2021-02-13 LAB — PROTEIN ELECTROPHORESIS, SERUM
A/G Ratio: 1.6 (ref 0.7–1.7)
Albumin ELP: 3.9 g/dL (ref 2.9–4.4)
Alpha-1-Globulin: 0.2 g/dL (ref 0.0–0.4)
Alpha-2-Globulin: 1.1 g/dL — ABNORMAL HIGH (ref 0.4–1.0)
Beta Globulin: 1 g/dL (ref 0.7–1.3)
Gamma Globulin: 0.2 g/dL — ABNORMAL LOW (ref 0.4–1.8)
Globulin, Total: 2.5 g/dL (ref 2.2–3.9)
M-Spike, %: 0.2 g/dL — ABNORMAL HIGH
Total Protein ELP: 6.4 g/dL (ref 6.0–8.5)

## 2021-02-13 LAB — KAPPA/LAMBDA LIGHT CHAINS
Kappa free light chain: 7.2 mg/L (ref 3.3–19.4)
Kappa, lambda light chain ratio: 0.75 (ref 0.26–1.65)
Lambda free light chains: 9.6 mg/L (ref 5.7–26.3)

## 2021-02-21 ENCOUNTER — Encounter: Payer: Self-pay | Admitting: Oncology

## 2021-02-28 ENCOUNTER — Other Ambulatory Visit: Payer: Self-pay | Admitting: Oncology

## 2021-03-04 ENCOUNTER — Other Ambulatory Visit: Payer: Self-pay | Admitting: Oncology

## 2021-03-06 ENCOUNTER — Other Ambulatory Visit: Payer: Self-pay | Admitting: Nurse Practitioner

## 2021-03-06 DIAGNOSIS — C9000 Multiple myeloma not having achieved remission: Secondary | ICD-10-CM

## 2021-03-10 ENCOUNTER — Inpatient Hospital Stay: Payer: Medicare Other

## 2021-03-10 ENCOUNTER — Other Ambulatory Visit: Payer: Self-pay

## 2021-03-10 ENCOUNTER — Telehealth: Payer: Self-pay | Admitting: Oncology

## 2021-03-10 ENCOUNTER — Inpatient Hospital Stay: Payer: Medicare Other | Attending: Nurse Practitioner

## 2021-03-10 ENCOUNTER — Inpatient Hospital Stay (HOSPITAL_BASED_OUTPATIENT_CLINIC_OR_DEPARTMENT_OTHER): Payer: Medicare Other | Admitting: Oncology

## 2021-03-10 VITALS — BP 126/72 | HR 68 | Temp 97.8°F | Resp 20 | Ht 74.0 in | Wt 274.0 lb

## 2021-03-10 DIAGNOSIS — G629 Polyneuropathy, unspecified: Secondary | ICD-10-CM | POA: Diagnosis not present

## 2021-03-10 DIAGNOSIS — Z5112 Encounter for antineoplastic immunotherapy: Secondary | ICD-10-CM | POA: Insufficient documentation

## 2021-03-10 DIAGNOSIS — C9000 Multiple myeloma not having achieved remission: Secondary | ICD-10-CM

## 2021-03-10 DIAGNOSIS — N289 Disorder of kidney and ureter, unspecified: Secondary | ICD-10-CM | POA: Diagnosis not present

## 2021-03-10 DIAGNOSIS — G893 Neoplasm related pain (acute) (chronic): Secondary | ICD-10-CM | POA: Insufficient documentation

## 2021-03-10 DIAGNOSIS — E119 Type 2 diabetes mellitus without complications: Secondary | ICD-10-CM | POA: Insufficient documentation

## 2021-03-10 DIAGNOSIS — I10 Essential (primary) hypertension: Secondary | ICD-10-CM | POA: Insufficient documentation

## 2021-03-10 DIAGNOSIS — Z79899 Other long term (current) drug therapy: Secondary | ICD-10-CM | POA: Diagnosis not present

## 2021-03-10 LAB — CBC WITH DIFFERENTIAL (CANCER CENTER ONLY)
Abs Immature Granulocytes: 0.08 10*3/uL — ABNORMAL HIGH (ref 0.00–0.07)
Basophils Absolute: 0.1 10*3/uL (ref 0.0–0.1)
Basophils Relative: 1 %
Eosinophils Absolute: 0.1 10*3/uL (ref 0.0–0.5)
Eosinophils Relative: 1 %
HCT: 38.1 % — ABNORMAL LOW (ref 39.0–52.0)
Hemoglobin: 13 g/dL (ref 13.0–17.0)
Immature Granulocytes: 1 %
Lymphocytes Relative: 14 %
Lymphs Abs: 0.8 10*3/uL (ref 0.7–4.0)
MCH: 31.6 pg (ref 26.0–34.0)
MCHC: 34.1 g/dL (ref 30.0–36.0)
MCV: 92.5 fL (ref 80.0–100.0)
Monocytes Absolute: 0.8 10*3/uL (ref 0.1–1.0)
Monocytes Relative: 15 %
Neutro Abs: 3.8 10*3/uL (ref 1.7–7.7)
Neutrophils Relative %: 68 %
Platelet Count: 230 10*3/uL (ref 150–400)
RBC: 4.12 MIL/uL — ABNORMAL LOW (ref 4.22–5.81)
RDW: 13.7 % (ref 11.5–15.5)
WBC Count: 5.7 10*3/uL (ref 4.0–10.5)
nRBC: 0.4 % — ABNORMAL HIGH (ref 0.0–0.2)

## 2021-03-10 LAB — CMP (CANCER CENTER ONLY)
ALT: 36 U/L (ref 0–44)
AST: 20 U/L (ref 15–41)
Albumin: 4.5 g/dL (ref 3.5–5.0)
Alkaline Phosphatase: 46 U/L (ref 38–126)
Anion gap: 10 (ref 5–15)
BUN: 15 mg/dL (ref 6–20)
CO2: 28 mmol/L (ref 22–32)
Calcium: 9 mg/dL (ref 8.9–10.3)
Chloride: 98 mmol/L (ref 98–111)
Creatinine: 1.32 mg/dL — ABNORMAL HIGH (ref 0.61–1.24)
GFR, Estimated: >60 mL/min (ref >60)
Glucose, Bld: 227 mg/dL — ABNORMAL HIGH (ref 70–99)
Potassium: 4.4 mmol/L (ref 3.5–5.1)
Sodium: 136 mmol/L (ref 135–145)
Total Bilirubin: 0.3 mg/dL (ref 0.3–1.2)
Total Protein: 6.5 g/dL (ref 6.5–8.1)

## 2021-03-10 MED ORDER — DARATUMUMAB-HYALURONIDASE-FIHJ 1800-30000 MG-UT/15ML ~~LOC~~ SOLN
1800.0000 mg | Freq: Once | SUBCUTANEOUS | Status: AC
  Administered 2021-03-10: 1800 mg via SUBCUTANEOUS
  Filled 2021-03-10: qty 15

## 2021-03-10 MED ORDER — DEXAMETHASONE 4 MG PO TABS
20.0000 mg | ORAL_TABLET | Freq: Once | ORAL | Status: AC
  Administered 2021-03-10: 20 mg via ORAL

## 2021-03-10 MED ORDER — DIPHENHYDRAMINE HCL 25 MG PO CAPS
50.0000 mg | ORAL_CAPSULE | Freq: Once | ORAL | Status: AC
  Administered 2021-03-10: 50 mg via ORAL

## 2021-03-10 MED ORDER — SODIUM CHLORIDE 0.9 % IV SOLN
Freq: Once | INTRAVENOUS | Status: DC
  Filled 2021-03-10: qty 250

## 2021-03-10 MED ORDER — ACETAMINOPHEN 325 MG PO TABS
650.0000 mg | ORAL_TABLET | Freq: Once | ORAL | Status: AC
  Administered 2021-03-10: 650 mg via ORAL

## 2021-03-10 NOTE — Progress Notes (Signed)
[place] OFFICE PROGRESS NOTE   Diagnosis: Multiple myeloma  INTERVAL HISTORY:   Mr. Sermersheim returns as scheduled.  He feels well.  No pain.  He recently developed an infection at the left great toe after peeling skin off of the toe.  He was prescribed antibiotic.  The infection is resolving.  He has been followed by the wound clinic.  No other complaint.  Objective:  Vital signs in last 24 hours:  Blood pressure 126/72, pulse 68, temperature 97.8 F (36.6 C), temperature source Tympanic, resp. rate 20, height $RemoveBe'6\' 2"'CHmUSlXEk$  (1.88 m), weight 274 lb (124.3 kg), SpO2 99 %.    HEENT: No thrush or ulcers Resp: Lungs clear bilaterally Cardio: Regular rate and rhythm GI: No hepatosplenomegaly, nontender Vascular: No leg edema  Skin: Partially removed callus at the lateral aspect of the left great toe, no erythema or discharge  Portacath/PICC-without erythema  Lab Results:  Lab Results  Component Value Date   WBC 5.7 03/10/2021   HGB 13.0 03/10/2021   HCT 38.1 (L) 03/10/2021   MCV 92.5 03/10/2021   PLT 230 03/10/2021   NEUTROABS 3.8 03/10/2021    CMP  Lab Results  Component Value Date   NA 136 03/10/2021   K 4.4 03/10/2021   CL 98 03/10/2021   CO2 28 03/10/2021   GLUCOSE 227 (H) 03/10/2021   BUN 15 03/10/2021   CREATININE 1.32 (H) 03/10/2021   CALCIUM 9.0 03/10/2021   PROT 6.5 03/10/2021   ALBUMIN 4.5 03/10/2021   AST 20 03/10/2021   ALT 36 03/10/2021   ALKPHOS 46 03/10/2021   BILITOT 0.3 03/10/2021   GFRNONAA >60 03/10/2021   GFRAA >60 08/10/2020     Medications: I have reviewed the patient's current medications.   Assessment/Plan: Multiple myeloma-IgG lambda serum monoclonal protein, elevated serum lambda light chains  Bone survey 10/11/2017-lytic lesions noted in the skull, right iliac, left second rib, and mottled appearance of the proximal femurs/pelvis  Bone marrow biopsy 10/14/2017-hypercellular marrow with plasma cell neoplasm, 82%  plasma cells, lambda light chain restricted, hyperdiploid with gains of chromosomes 3, 5, 7, 9, and 11. FISH panel positive for gain of ATM(+11)  Cycle 1 RVD 10/18/2017 (Revlimid started 10/23/2017)  Cycle 2 RVD 11/15/2017  Cycle 3 RVD 12/13/2017  Cycle 4 RVD 01/14/2018  Cycle 5 of RVD 02/14/2018 (Revlimid given for 7 days and 1 week of Velcade), therapy held beginning 02/21/2018 secondary to plan for stem cell therapy  Bone marrow biopsy 02/20/2018, 1-2% plasma cell  PET scan 02/20/2018, no malignant range activity above background, numerous lytic lesions throughout the axial and appendicular skeleton,, left iliac wing fracture  Melphalan, 200 mg/m on 03/24/2018  Stem cell infusion 03/25/2018  Restaging at [facility] 07/09/2018: Normal lambda light chains, no serum M spike, bone marrow biopsy negative for myeloma, less than 1% plasma cells  PET scan at [facility] 07/09/2018-lytic bone lesions, no malignant range activity  Initiation of maintenance Revlimid, 10 mg, 21/28 days 08/15/2018  Cycle 2 maintenance Revlimid 09/12/2018  Cycle 3 maintenance Revlimid 10/10/2018  Revlimid placed on hold 06/15/2019 due to presyncopal/syncopal episodes and diarrhea  Revlimid resumed 5 mg 21 days on/7 days off following office visit 06/29/2019  Revlimidplaced on hold 07/14/2019  Revlimid resumed 08/21/2019  Bone survey 09/09/2019-no acute findings or clear explanation for right buttock pain. Lucent lesions in the right pelvis are stable without pathologic fracture. Evidence of healing lytic lesions in the right scapula, cervical spine spinous processes and left L4 transverse  process. Possible mild progression of lytic lesions within the L1 and L4 vertebral bodies. Stable lytic lesions in the calvarium.  PET scan 09/25/2019-new FDG avid bone lesions in the right humeral neck and sacrum similar remaining lytic lesions with FDG activity below background  Bone marrow biopsy at [facility] on  10/01/2019-5% plasma cells on the bone marrow biopsy suboptimal sample  Cycle 1 daratumumab, pomalidomide, Decadron 10/19/2019  Zometa 10/19/2019  Cycle 2 daratumumab, pomalidomide, Decadron 11/16/2019 (he will begin the pomalidomide 11/17/2019)  Cycle 3 daratumumab, pomalidomide, and Decadron 12/14/2019  Cycle 4 daratumumab, pomalidomide, and Decadron 01/11/2020  Cycle 5 daratumumab, pomalidomide, Decadron 02/03/2020 (he began pomalidomide on 02/06/2020)  Cycle 6 daratumumab, pomalidomide, Decadron 03/02/2020 (pomalidomide starting 03/12/2020)  Cycle 7 daratumumab, pomalidomide, Decadron 03/29/2020 (pomalidomide scheduled to start 04/15/2020)  Cycle 8 daratumumab, pomalidomide, Decadron 05/04/2020 (pomalidomide started 05/14/2020)  Cycle 9 daratumumab-monthly 06/15/2020, pomalidomide held  Cycle 10 daratumumab 07/13/2020, pomalidomide 2 mg 21 days beginning 07/15/2020  Cycle 11 daratumumab 08/10/2020, pomalidomide 2 mg 21 days beginning 08/12/2020  Bone marrow biopsy 09/06/2020-hypocellular bone marrow with a relative erythroid hyperplasia and no increase in plasma cells; MRD 0.0050%  Cycle 12 daratumumab10/05/2020, pomalidomide 2 mg 21 days beginning 09/09/2020  Cycle 13 daratumumab 10/11/2020, pomalidomide 2 mg 21 days beginning 10/10/2020  Cycle 14 daratumumab 11/08/2020, pomalidomide reduced to 1 mg 21 days  Cycle 15 daratumumab 12/14/2020, Pomalidomide 1 mg 21 days beginning 12/13/2020  Cycle 16 daratumumab 01/12/2021, pomalidomide 1 mg 21 days beginning 01/11/2021  Cycle 17 daratumumab 02/10/2021, pomalidomide 1 mg 21 days beginning 02/09/2021  Cycle 18 daratumumab 03/10/2021, pomalidomide 1 mg 21 days beginning 03/11/2021   2.Pain secondary to multiple myeloma involving the spine and pelvis-resolved  MRI of the lumbar spine 10/02/2018-numerous rounded foci in the vertebral bodies, hypertrophy of the L4 transverse process  3.Hypertension-losartan dose increased  11/15/2017  4.Depression-improved with Wellbutrin beginning September 2020  5.Altered mental status-improved depression related? 6.Diabetes 7. Recurrent episodes of fall/syncope-etiology unclear-evaluated [facility]y at [facility], MRI brain 01/25/2020 with abnormal signal at the left superior frontal gyrus-potentially representing a low-grade glioma, scattered foci of increased T2 white matter signal-likely related to chronic small vessel disease  Stereotactic brain biopsy at [facility] 03/14/2020-gliosis, no evidence of malignancy  8.Peripheral neuropathy-gabapentin started 07/07/2020  9.Renal insufficiency       Disposition: Mr. Timberman appears stable.  He continues treatment with daratumumab/Decadron/pomalidomide.  He will begin another cycle today.  The serum M spike returned at 0.2 on 02/10/2021.  We submitted a sample for a daratumumab specific electrophoresis today.  The serum light chains remain normal.  Mr. Bochenek will be referred for Evusheld therapy.  He will return for an office visit and daratumumab in 1 month.  Betsy Coder, MD  03/10/2021  10:18 AM

## 2021-03-10 NOTE — Patient Instructions (Signed)
[facility] Discharge Instructions for Patients Receiving Chemotherapy  Today you received the following chemotherapy agents Daratumumab-hyaluronidase-fihj (DARZALEX FASPRO).  To help prevent nausea and vomiting after your treatment, we encourage you to take your nausea medication as prescribed.   If you develop nausea and vomiting that is not controlled by your nausea medication, call the clinic.   BELOW ARE SYMPTOMS THAT SHOULD BE REPORTED IMMEDIATELY:  *FEVER GREATER THAN 100.5 F  *CHILLS WITH OR WITHOUT FEVER  NAUSEA AND VOMITING THAT IS NOT CONTROLLED WITH YOUR NAUSEA MEDICATION  *UNUSUAL SHORTNESS OF BREATH  *UNUSUAL BRUISING OR BLEEDING  TENDERNESS IN MOUTH AND THROAT WITH OR WITHOUT PRESENCE OF ULCERS  *URINARY PROBLEMS  *BOWEL PROBLEMS  UNUSUAL RASH Items with * indicate a potential emergency and should be followed up as soon as possible.  Feel free to call the clinic should you have any questions or concerns at The clinic phone number is [phone].  Please show the [place] [facility] at check-in to the Emergency Department and triage nurse.

## 2021-03-10 NOTE — Telephone Encounter (Signed)
Scheduled appt per 4/8 LOS - pt is aware of appt date and time

## 2021-03-12 ENCOUNTER — Other Ambulatory Visit: Payer: Self-pay | Admitting: Physician Assistant

## 2021-03-12 DIAGNOSIS — C9000 Multiple myeloma not having achieved remission: Secondary | ICD-10-CM

## 2021-03-12 NOTE — Progress Notes (Signed)
I connected by phone with Telford Nab on 03/12/2021, 2:01 PM to discuss the potential use of a new treatment, tixagevimab/cilgavimab, for pre-exposure prophylaxis for prevention of coronavirus disease 2019 (COVID-19) caused by the SARS-CoV-2 virus.  This patient is a 55 y.o. male that meets the FDA criteria for Emergency Use Authorization of tixagevimab/cilgavimab for pre-exposure prophylaxis of COVID-19 disease. Pt meets following criteria:  Age >12 yr and weight > 40kg  Not currently infected with SARS-CoV-2 and has no known recent exposure to an individual infected with SARS-CoV-2 AND o Who has moderate to severe immune compromise due to a medical condition or receipt of immunosuppressive medications or treatments and may not mount an adequate immune response to COVID-19 vaccination or  o Vaccination with any available COVID-19 vaccine, according to the approved or authorized schedule, is not recommended due to a history of severe adverse reaction (e.g., severe allergic reaction) to a COVID-19 vaccine(s) and/or COVID-19 vaccine component(s).  o Patient meets the following definition of mod-severe immune compromised status: 6. Other actively treated hematologic malignancies or severe congenital immunodeficiency syndromes  I have spoken and communicated the following to the patient or parent/caregiver regarding COVID monoclonal antibody treatment:  1. FDA has authorized the emergency use of tixagevimab/cilgavimab for the pre-exposure prophylaxis of COVID-19 in patients with moderate-severe immunocompromised status, who meet above EUA criteria.  2. The significant known and potential risks and benefits of COVID monoclonal antibody, and the extent to which such potential risks and benefits are unknown.  3. Information on available alternative treatments and the risks and benefits of those alternatives, including clinical trials.  4. The patient or parent/caregiver has the option to accept or  refuse COVID monoclonal antibody treatment.  After reviewing this information with the patient, agree to receive tixagevimab/cilgavimab   Pt will get the infusion during his previously scheduled infusion 5/6  Angelena Form, PA-C, 03/12/2021, 2:01 PM

## 2021-03-13 ENCOUNTER — Encounter: Payer: Self-pay | Admitting: Oncology

## 2021-03-13 LAB — IFE, DARA-SPECIFIC, SERUM
IgA: 53 mg/dL — ABNORMAL LOW (ref 90–386)
IgG (Immunoglobin G), Serum: 313 mg/dL — ABNORMAL LOW (ref 603–1613)
IgM (Immunoglobulin M), Srm: 7 mg/dL — ABNORMAL LOW (ref 20–172)

## 2021-03-15 ENCOUNTER — Encounter: Payer: Self-pay | Admitting: Nurse Practitioner

## 2021-03-28 ENCOUNTER — Other Ambulatory Visit: Payer: Self-pay | Admitting: *Deleted

## 2021-03-28 MED ORDER — POMALIDOMIDE 1 MG PO CAPS: ORAL_CAPSULE | ORAL | 0 refills | Status: DC

## 2021-03-31 ENCOUNTER — Encounter: Payer: Self-pay | Admitting: Oncology

## 2021-03-31 NOTE — Telephone Encounter (Signed)
Dr. Benay Spice

## 2021-04-02 ENCOUNTER — Other Ambulatory Visit: Payer: Self-pay | Admitting: Oncology

## 2021-04-07 ENCOUNTER — Inpatient Hospital Stay: Payer: Medicare Other

## 2021-04-07 ENCOUNTER — Inpatient Hospital Stay (HOSPITAL_BASED_OUTPATIENT_CLINIC_OR_DEPARTMENT_OTHER): Payer: Medicare Other | Admitting: Nurse Practitioner

## 2021-04-07 ENCOUNTER — Other Ambulatory Visit: Payer: Self-pay

## 2021-04-07 ENCOUNTER — Encounter: Payer: Self-pay | Admitting: Nurse Practitioner

## 2021-04-07 ENCOUNTER — Inpatient Hospital Stay: Payer: Medicare Other | Attending: Nurse Practitioner

## 2021-04-07 VITALS — BP 124/69 | HR 69 | Temp 98.2°F | Resp 19 | Ht 74.0 in | Wt 270.8 lb

## 2021-04-07 DIAGNOSIS — C9000 Multiple myeloma not having achieved remission: Secondary | ICD-10-CM

## 2021-04-07 DIAGNOSIS — R4182 Altered mental status, unspecified: Secondary | ICD-10-CM | POA: Diagnosis not present

## 2021-04-07 DIAGNOSIS — Z5112 Encounter for antineoplastic immunotherapy: Secondary | ICD-10-CM | POA: Insufficient documentation

## 2021-04-07 DIAGNOSIS — G893 Neoplasm related pain (acute) (chronic): Secondary | ICD-10-CM

## 2021-04-07 DIAGNOSIS — Z298 Encounter for other specified prophylactic measures: Secondary | ICD-10-CM | POA: Diagnosis not present

## 2021-04-07 DIAGNOSIS — N289 Disorder of kidney and ureter, unspecified: Secondary | ICD-10-CM | POA: Insufficient documentation

## 2021-04-07 DIAGNOSIS — G629 Polyneuropathy, unspecified: Secondary | ICD-10-CM

## 2021-04-07 DIAGNOSIS — Z79899 Other long term (current) drug therapy: Secondary | ICD-10-CM | POA: Insufficient documentation

## 2021-04-07 DIAGNOSIS — I10 Essential (primary) hypertension: Secondary | ICD-10-CM

## 2021-04-07 DIAGNOSIS — E119 Type 2 diabetes mellitus without complications: Secondary | ICD-10-CM | POA: Diagnosis not present

## 2021-04-07 DIAGNOSIS — R197 Diarrhea, unspecified: Secondary | ICD-10-CM | POA: Diagnosis not present

## 2021-04-07 DIAGNOSIS — F32A Depression, unspecified: Secondary | ICD-10-CM | POA: Diagnosis not present

## 2021-04-07 DIAGNOSIS — I6782 Cerebral ischemia: Secondary | ICD-10-CM | POA: Insufficient documentation

## 2021-04-07 DIAGNOSIS — E1140 Type 2 diabetes mellitus with diabetic neuropathy, unspecified: Secondary | ICD-10-CM | POA: Diagnosis not present

## 2021-04-07 DIAGNOSIS — Z9221 Personal history of antineoplastic chemotherapy: Secondary | ICD-10-CM | POA: Insufficient documentation

## 2021-04-07 LAB — CMP (CANCER CENTER ONLY)
ALT: 32 U/L (ref 0–44)
AST: 21 U/L (ref 15–41)
Albumin: 4.2 g/dL (ref 3.5–5.0)
Alkaline Phosphatase: 47 U/L (ref 38–126)
Anion gap: 11 (ref 5–15)
BUN: 22 mg/dL — ABNORMAL HIGH (ref 6–20)
CO2: 24 mmol/L (ref 22–32)
Calcium: 8.9 mg/dL (ref 8.9–10.3)
Chloride: 99 mmol/L (ref 98–111)
Creatinine: 1.35 mg/dL — ABNORMAL HIGH (ref 0.61–1.24)
GFR, Estimated: >60 mL/min (ref >60)
Glucose, Bld: 194 mg/dL — ABNORMAL HIGH (ref 70–99)
Potassium: 4.3 mmol/L (ref 3.5–5.1)
Sodium: 134 mmol/L — ABNORMAL LOW (ref 135–145)
Total Bilirubin: 0.3 mg/dL (ref 0.3–1.2)
Total Protein: 6.5 g/dL (ref 6.5–8.1)

## 2021-04-07 LAB — CBC WITH DIFFERENTIAL (CANCER CENTER ONLY)
Abs Immature Granulocytes: 0.05 10*3/uL (ref 0.00–0.07)
Basophils Absolute: 0.1 10*3/uL (ref 0.0–0.1)
Basophils Relative: 1 %
Eosinophils Absolute: 0.1 10*3/uL (ref 0.0–0.5)
Eosinophils Relative: 3 %
HCT: 37.1 % — ABNORMAL LOW (ref 39.0–52.0)
Hemoglobin: 12.8 g/dL — ABNORMAL LOW (ref 13.0–17.0)
Immature Granulocytes: 1 %
Lymphocytes Relative: 16 %
Lymphs Abs: 0.8 10*3/uL (ref 0.7–4.0)
MCH: 31.2 pg (ref 26.0–34.0)
MCHC: 34.5 g/dL (ref 30.0–36.0)
MCV: 90.5 fL (ref 80.0–100.0)
Monocytes Absolute: 0.9 10*3/uL (ref 0.1–1.0)
Monocytes Relative: 18 %
Neutro Abs: 3.3 10*3/uL (ref 1.7–7.7)
Neutrophils Relative %: 61 %
Platelet Count: 222 10*3/uL (ref 150–400)
RBC: 4.1 MIL/uL — ABNORMAL LOW (ref 4.22–5.81)
RDW: 13.7 % (ref 11.5–15.5)
WBC Count: 5.3 10*3/uL (ref 4.0–10.5)
nRBC: 0 % (ref 0.0–0.2)

## 2021-04-07 MED ORDER — DARATUMUMAB-HYALURONIDASE-FIHJ 1800-30000 MG-UT/15ML ~~LOC~~ SOLN
1800.0000 mg | Freq: Once | SUBCUTANEOUS | Status: AC
  Administered 2021-04-07: 1800 mg via SUBCUTANEOUS
  Filled 2021-04-07: qty 15

## 2021-04-07 MED ORDER — ALBUTEROL SULFATE HFA 108 (90 BASE) MCG/ACT IN AERS: 2.0000 | INHALATION_SPRAY | Freq: Once | RESPIRATORY_TRACT | Status: DC | PRN

## 2021-04-07 MED ORDER — DIPHENHYDRAMINE HCL 25 MG PO CAPS
ORAL_CAPSULE | ORAL | Status: AC
  Filled 2021-04-07: qty 2

## 2021-04-07 MED ORDER — DIPHENHYDRAMINE HCL 50 MG/ML IJ SOLN: 50.0000 mg | Freq: Once | INTRAMUSCULAR | Status: DC | PRN

## 2021-04-07 MED ORDER — EPINEPHRINE 0.3 MG/0.3ML IJ SOAJ
0.3000 mg | Freq: Once | INTRAMUSCULAR | Status: DC | PRN
  Filled 2021-04-07: qty 0.6

## 2021-04-07 MED ORDER — HEPARIN SOD (PORK) LOCK FLUSH 100 UNIT/ML IV SOLN
500.0000 [IU] | Freq: Once | INTRAVENOUS | Status: DC | PRN
  Filled 2021-04-07: qty 5

## 2021-04-07 MED ORDER — CILGAVIMAB (PART OF EVUSHELD) INJECTION
300.0000 mg | Freq: Once | INTRAMUSCULAR | Status: AC
  Administered 2021-04-07: 300 mg via INTRAMUSCULAR

## 2021-04-07 MED ORDER — DEXAMETHASONE 4 MG PO TABS
ORAL_TABLET | ORAL | Status: AC
  Filled 2021-04-07: qty 5

## 2021-04-07 MED ORDER — ACETAMINOPHEN 325 MG PO TABS
ORAL_TABLET | ORAL | Status: AC
  Filled 2021-04-07: qty 2

## 2021-04-07 MED ORDER — SODIUM CHLORIDE 0.9% FLUSH
10.0000 mL | INTRAVENOUS | Status: DC | PRN
  Filled 2021-04-07: qty 10

## 2021-04-07 MED ORDER — DIPHENHYDRAMINE HCL 25 MG PO CAPS
50.0000 mg | ORAL_CAPSULE | Freq: Once | ORAL | Status: AC
  Administered 2021-04-07: 50 mg via ORAL

## 2021-04-07 MED ORDER — DEXAMETHASONE 4 MG PO TABS
20.0000 mg | ORAL_TABLET | Freq: Once | ORAL | Status: AC
  Administered 2021-04-07: 20 mg via ORAL

## 2021-04-07 MED ORDER — DEXAMETHASONE 4 MG PO TABS: ORAL_TABLET | ORAL | 1 refills | Status: AC

## 2021-04-07 MED ORDER — METHYLPREDNISOLONE SODIUM SUCC 125 MG IJ SOLR: 125.0000 mg | Freq: Once | INTRAMUSCULAR | Status: DC | PRN

## 2021-04-07 MED ORDER — ACETAMINOPHEN 325 MG PO TABS
650.0000 mg | ORAL_TABLET | Freq: Once | ORAL | Status: AC
  Administered 2021-04-07: 650 mg via ORAL

## 2021-04-07 MED ORDER — TIXAGEVIMAB (PART OF EVUSHELD) INJECTION
300.0000 mg | Freq: Once | INTRAMUSCULAR | Status: AC
Start: 2021-04-07 — End: 2021-04-07
  Administered 2021-04-07: 300 mg via INTRAMUSCULAR

## 2021-04-07 NOTE — Patient Instructions (Signed)
[facility]  Discharge Instructions: Thank you for choosing [facility] to provide your oncology and hematology care.   If you have a lab appointment with the [facility], please go directly to the [facility] and check in at the registration area.   Wear comfortable clothing and clothing appropriate for easy access to any Portacath or PICC line.   We strive to give you quality time with your provider. You may need to reschedule your appointment if you arrive late (15 or more minutes).  Arriving late affects you and other patients whose appointments are after yours.  Also, if you miss three or more appointments without notifying the office, you may be dismissed from the clinic at the provider's discretion.      For prescription refill requests, have your pharmacy contact our office and allow 72 hours for refills to be completed.    Today you received the following chemotherapy and/or immunotherapy agents Evushield and dara      To help prevent nausea and vomiting after your treatment, we encourage you to take your nausea medication as directed.  BELOW ARE SYMPTOMS THAT SHOULD BE REPORTED IMMEDIATELY: . *FEVER GREATER THAN 100.4 F (38 C) OR HIGHER . *CHILLS OR SWEATING . *NAUSEA AND VOMITING THAT IS NOT CONTROLLED WITH YOUR NAUSEA MEDICATION . *UNUSUAL SHORTNESS OF BREATH . *UNUSUAL BRUISING OR BLEEDING . *URINARY PROBLEMS (pain or burning when urinating, or frequent urination) . *BOWEL PROBLEMS (unusual diarrhea, constipation, pain near the anus) . TENDERNESS IN MOUTH AND THROAT WITH OR WITHOUT PRESENCE OF ULCERS (sore throat, sores in mouth, or a toothache) . UNUSUAL RASH, SWELLING OR PAIN  . UNUSUAL VAGINAL DISCHARGE OR ITCHING   Items with * indicate a potential emergency and should be followed up as soon as possible or go to the Emergency Department if any problems should occur.  Please show the CHEMOTHERAPY ALERT CARD or IMMUNOTHERAPY  ALERT CARD at check-in to the Emergency Department and triage nurse.  Should you have questions after your visit or need to cancel or reschedule your appointment, please contact [facility]  Dept: [phone]  and follow the prompts.  Office hours are 8:00 a.m. to 4:30 p.m. Monday - Friday. Please note that voicemails left after 4:00 p.m. may not be returned until the following business day.  We are closed weekends and major holidays. You have access to a nurse at all times for urgent questions. Please call the main number to the clinic Dept: [phone] and follow the prompts.   For any non-urgent questions, you may also contact your provider using MyChart. We now offer e-Visits for anyone 53 and older to request care online for non-urgent symptoms. For details visit [email or web address].   Also download the MyChart app! Go to the app store, search "MyChart", open the app, select [facility], and log in with your MyChart username and password.  Due to Covid, a mask is required upon entering the hospital/clinic. If you do not have a mask, one will be given to you upon arrival. For doctor visits, patients may have 1 support person aged 46 or older with them. For treatment visits, patients cannot have anyone with them due to current Covid guidelines and our immunocompromised population.    Daratumumab injection What is this medicine? DARATUMUMAB (dar a toom ue mab) is a monoclonal antibody. It is used to treat multiple myeloma. This medicine may be used for other purposes; ask your health care provider  or pharmacist if you have questions. COMMON BRAND NAME(S): DARZALEX What should I tell my health care provider before I take this medicine? They need to know if you have any of these conditions:  hereditary fructose intolerance  infection (especially a virus infection such as chickenpox, herpes, or hepatitis B virus)  lung or breathing disease (asthma, COPD)  an  unusual or allergic reaction to daratumumab, sorbitol, other medicines, foods, dyes, or preservatives  pregnant or trying to get pregnant  breast-feeding How should I use this medicine? This medicine is for infusion into a vein. It is given by a health care professional in a hospital or clinic setting. Talk to your pediatrician regarding the use of this medicine in children. Special care may be needed. Overdosage: If you think you have taken too much of this medicine contact a poison control center or emergency room at once. NOTE: This medicine is only for you. Do not share this medicine with others. What if I miss a dose? Keep appointments for follow-up doses as directed. It is important not to miss your dose. Call your doctor or health care professional if you are unable to keep an appointment. What may interact with this medicine? Interactions have not been studied. This list may not describe all possible interactions. Give your health care provider a list of all the medicines, herbs, non-prescription drugs, or dietary supplements you use. Also tell them if you smoke, drink alcohol, or use illegal drugs. Some items may interact with your medicine. What should I watch for while using this medicine? Your condition will be monitored carefully while you are receiving this medicine. This medicine can cause serious allergic reactions. To reduce your risk, your health care provider may give you other medicine to take before receiving this one. Be sure to follow the directions from your health care provider. This medicine can affect the results of blood tests to match your blood type. These changes can last for up to 6 months after the final dose. Your healthcare provider will do blood tests to match your blood type before you start treatment. Tell all of your healthcare providers that you are being treated with this medicine before receiving a blood transfusion. This medicine can affect the results of  some tests used to determine treatment response; extra tests may be needed to evaluate response. Do not become pregnant while taking this medicine or for 3 months after stopping it. Women should inform their health care provider if they wish to become pregnant or think they might be pregnant. There is a potential for serious side effects to an unborn child. Talk to your health care provider for more information. Do not breast-feed an infant while taking this medicine. What side effects may I notice from receiving this medicine? Side effects that you should report to your doctor or health care professional as soon as possible:  allergic reactions (skin rash; itching or hives; swelling of the face, lips, or tongue)  infection (fever, chills, cough, sore throat, pain or difficulty passing urine)  infusion reaction (dizziness, fast heartbeat, feeling faint or lightheaded, falls, headache, increase in blood pressure, nausea, vomiting, or wheezing or trouble breathing with loud or whistling sounds)  unusual bleeding or bruising Side effects that usually do not require medical attention (report to your doctor or health care professional if they continue or are bothersome):  constipation  diarrhea  pain, tingling, numbness in the hands or feet  swelling of the ankles, feet, hands  tiredness This list may  not describe all possible side effects. Call your doctor for medical advice about side effects. You may report side effects to FDA at 1-800-FDA-1088. Where should I keep my medicine? This drug is given in a hospital or clinic and will not be stored at home. NOTE: This sheet is a summary. It may not cover all possible information. If you have questions about this medicine, talk to your doctor, pharmacist, or health care provider.  2021 Elsevier/Gold Standard (2020-11-10 13:28:52)

## 2021-04-07 NOTE — Progress Notes (Addendum)
[place] OFFICE PROGRESS NOTE   Diagnosis: Multiple myeloma  INTERVAL HISTORY:   Mr. Costabile returns as scheduled.  He completed another cycle of Pomalidomide beginning 03/11/2021.  He denies nausea/vomiting.  No mouth sores.  Stable baseline diarrhea.  No fever.  Left big toe is healing following a debridement procedure.  He is being followed at a wound clinic.  He reports neuropathy symptoms in his feet are worse.  He continues gabapentin.  Feet are now painful especially at nighttime.  No tooth, gum or jaw issues.  No calf pain.  Objective:  Vital signs in last 24 hours:  Blood pressure 124/69, pulse 69, temperature 98.2 F (36.8 C), temperature source Oral, resp. rate 19, height $RemoveBe'6\' 2"'[id number]$  (1.88 m), weight 270 lb 12.8 oz (122.8 kg), SpO2 98 %.    HEENT: No thrush or ulcers. Resp: Lungs clear bilaterally. Cardio: Regular rate and rhythm. GI: Abdomen soft and nontender.  No hepatomegaly. Vascular: Left lower leg is slightly larger than the right lower leg.  Varicosities noted. Neuro: Sensation intact to light touch over both feet. Skin: Wound at the left great toe is healing.   Lab Results:  Lab Results  Component Value Date   WBC 5.3 04/07/2021   HGB 12.8 (L) 04/07/2021   HCT 37.1 (L) 04/07/2021   MCV 90.5 04/07/2021   PLT 222 04/07/2021   NEUTROABS 3.3 04/07/2021    Imaging:  No results found.  Medications: I have reviewed the patient's current medications.  Assessment/Plan: 1.  Multiple myeloma-IgG lambda serum monoclonal protein, elevated serum lambda light chains  Bone survey 10/11/2017-lytic lesions noted in the skull, right iliac, left second rib, and mottled appearance of the proximal femurs/pelvis  Bone marrow biopsy 10/14/2017-hypercellular marrow with plasma cell neoplasm, 82% plasma cells, lambda light chain restricted, hyperdiploid with gains of chromosomes 3, 5, 7, 9, and 11. FISH panel positive for gain of ATM(+11)  Cycle 1 RVD  10/18/2017 (Revlimid started 10/23/2017)  Cycle 2 RVD 11/15/2017  Cycle 3 RVD 12/13/2017  Cycle 4 RVD 01/14/2018  Cycle 5 of RVD 02/14/2018 (Revlimid given for 7 days and 1 week of Velcade), therapy held beginning 02/21/2018 secondary to plan for stem cell therapy  Bone marrow biopsy 02/20/2018, 1-2% plasma cell  PET scan 02/20/2018, no malignant range activity above background, numerous lytic lesions throughout the axial and appendicular skeleton,, left iliac wing fracture  Melphalan, 200 mg/m on 03/24/2018  Stem cell infusion 03/25/2018  Restaging at [facility] 07/09/2018: Normal lambda light chains, no serum M spike, bone marrow biopsy negative for myeloma, less than 1% plasma cells  PET scan at [facility] 07/09/2018-lytic bone lesions, no malignant range activity  Initiation of maintenance Revlimid, 10 mg, 21/28 days 08/15/2018  Cycle 2 maintenance Revlimid 09/12/2018  Cycle 3 maintenance Revlimid 10/10/2018  Revlimid placed on hold 06/15/2019 due to presyncopal/syncopal episodes and diarrhea  Revlimid resumed 5 mg 21 days on/7 days off following office visit 06/29/2019  Revlimidplaced on hold 07/14/2019  Revlimid resumed 08/21/2019  Bone survey 09/09/2019-no acute findings or clear explanation for right buttock pain. Lucent lesions in the right pelvis are stable without pathologic fracture. Evidence of healing lytic lesions in the right scapula, cervical spine spinous processes and left L4 transverse process. Possible mild progression of lytic lesions within the L1 and L4 vertebral bodies. Stable lytic lesions in the calvarium.  PET scan 09/25/2019-new FDG avid bone lesions in the right humeral neck and sacrum similar remaining lytic lesions with FDG activity below  background  Bone marrow biopsy at [facility] on 10/01/2019-5% plasma cells on the bone marrow biopsy suboptimal sample  Cycle 1 daratumumab, pomalidomide, Decadron 10/19/2019  Zometa 10/19/2019  Cycle 2  daratumumab, pomalidomide, Decadron 11/16/2019 (he will begin the pomalidomide 11/17/2019)  Cycle 3 daratumumab, pomalidomide, and Decadron 12/14/2019  Cycle 4 daratumumab, pomalidomide, and Decadron 01/11/2020  Cycle 5 daratumumab, pomalidomide, Decadron 02/03/2020 (he began pomalidomide on 02/06/2020)  Cycle 6 daratumumab, pomalidomide, Decadron 03/02/2020 (pomalidomide starting 03/12/2020)  Cycle 7 daratumumab, pomalidomide, Decadron 03/29/2020 (pomalidomide scheduled to start 04/15/2020)  Cycle 8 daratumumab, pomalidomide, Decadron 05/04/2020 (pomalidomide started 05/14/2020)  Cycle 9 daratumumab-monthly 06/15/2020, pomalidomide held  Cycle 10 daratumumab 07/13/2020, pomalidomide 2 mg 21 days beginning 07/15/2020  Cycle 11 daratumumab 08/10/2020, pomalidomide 2 mg 21 days beginning 08/12/2020  Bone marrow biopsy 09/06/2020-hypocellular bone marrow with a relative erythroid hyperplasia and no increase in plasma cells; MRD 0.0050%  Cycle 12 daratumumab10/05/2020, pomalidomide 2 mg 21 days beginning 09/09/2020  Cycle 13 daratumumab 10/11/2020, pomalidomide 2 mg 21 days beginning 10/10/2020  Cycle 14 daratumumab 11/08/2020, pomalidomide reduced to 1 mg 21 days  Cycle 15 daratumumab 12/14/2020, Pomalidomide 1 mg 21 days beginning 12/13/2020  Cycle 16 daratumumab 01/12/2021, pomalidomide 1 mg 21 days beginning 01/11/2021  Cycle 17 daratumumab 02/10/2021, pomalidomide 1 mg 21 days beginning 02/09/2021  Cycle 18 daratumumab 03/10/2021, pomalidomide 1 mg 21 days beginning 03/11/2021  Cycle 19 daratumumab 04/07/2021, Pomalidomide 1 mg 21 days beginning 04/08/2021   2.Pain secondary to multiple myeloma involving the spine and pelvis-resolved  MRI of the lumbar spine 10/02/2018-numerous rounded foci in the vertebral bodies, hypertrophy of the L4 transverse process  3.Hypertension-losartan dose increased 11/15/2017  4.Depression-improved with Wellbutrin beginning September 2020  5.Altered mental  status-improved depression related? 6.Diabetes 7. Recurrent episodes of fall/syncope-etiology unclear-evaluated by neurology at [facility], MRI brain 01/25/2020 with abnormal signal at the left superior frontal gyrus-potentially representing a low-grade glioma, scattered foci of increased T2 white matter signal-likely related to chronic small vessel disease  Stereotactic brain biopsy at [facility] 03/14/2020-gliosis, no evidence of malignancy  8.Peripheral neuropathy-gabapentin started 07/07/2020; reports increase in symptoms especially at nighttime 04/07/2021  9.Renal insufficiency   Disposition: Mr. Binford appears stable.  He has completed 18 cycles of daratumumab/pomalidomide.  Plan to proceed with cycle 19 today as scheduled.  He will begin the Pomalidomide on 04/08/2021.  Next Zometa infusion in 1 month.  We reviewed the CBC and chemistry panel from today.  Labs adequate to proceed as above.  He has increased neuropathy symptoms in both feet.  We discussed holding Pomalidomide.  He and his wife would like to continue with treatment as he is currently doing.  They understand the risk of neuropathy symptoms worsening and are willing to accept this risk.  He has trace edema at the left lower leg.  Low clinical suspicion for DVT.  He will contact the office with increased edema and or pain.  He will receive Evusheld today.  He will return for lab, follow-up, daratumumab and Zometa in 1 month.  We are available to see him sooner if needed.  Patient seen with Dr. Benay Spice.    [name] ANP/GNP-BC   04/07/2021  10:17 AM  This was a shared visit with [name].  Mr. Dom was interviewed and examined.  His overall status appears unchanged.  The plan is to continue pomalidomide and daratumumab.  He understands that the possibility of progressive neuropathy while on pomalidomide.  He would like to continue the pomalidomide.  I  was present for today's visit.  I performed  medical decision making.  Julieanne Manson, MD

## 2021-04-07 NOTE — Progress Notes (Signed)
Steve Carter presents today for injection per the provider's orders.  evushield 300 mg  administration without incident; injection site WNL; see MAR for injection details.  Patient tolerated procedure well and without incident.  No questions or complaints noted at this time. 1 hr post injection.

## 2021-04-27 ENCOUNTER — Encounter: Payer: Self-pay | Admitting: Oncology

## 2021-04-28 ENCOUNTER — Other Ambulatory Visit: Payer: Self-pay | Admitting: *Deleted

## 2021-04-28 ENCOUNTER — Encounter: Payer: Self-pay | Admitting: Oncology

## 2021-04-28 DIAGNOSIS — C9000 Multiple myeloma not having achieved remission: Secondary | ICD-10-CM

## 2021-04-28 NOTE — Progress Notes (Signed)
Placed orders for IFE, Dara-Specific, Serum per Dr. Benay Spice request.

## 2021-04-30 ENCOUNTER — Other Ambulatory Visit: Payer: Self-pay | Admitting: Oncology

## 2021-05-02 ENCOUNTER — Encounter: Payer: Self-pay | Admitting: Oncology

## 2021-05-02 ENCOUNTER — Other Ambulatory Visit: Payer: Self-pay | Admitting: Oncology

## 2021-05-08 ENCOUNTER — Other Ambulatory Visit: Payer: BC Managed Care – PPO

## 2021-05-08 ENCOUNTER — Ambulatory Visit: Payer: BC Managed Care – PPO | Admitting: Nurse Practitioner

## 2021-05-08 ENCOUNTER — Ambulatory Visit: Payer: BC Managed Care – PPO

## 2021-05-09 ENCOUNTER — Encounter: Payer: Self-pay | Admitting: Nurse Practitioner

## 2021-05-09 ENCOUNTER — Other Ambulatory Visit: Payer: Self-pay

## 2021-05-09 ENCOUNTER — Inpatient Hospital Stay: Payer: Medicare Other | Attending: Nurse Practitioner

## 2021-05-09 ENCOUNTER — Inpatient Hospital Stay: Payer: Medicare Other

## 2021-05-09 ENCOUNTER — Inpatient Hospital Stay (HOSPITAL_BASED_OUTPATIENT_CLINIC_OR_DEPARTMENT_OTHER): Payer: Medicare Other | Admitting: Nurse Practitioner

## 2021-05-09 VITALS — BP 122/76 | HR 65 | Temp 97.7°F | Resp 18 | Ht 74.0 in | Wt 276.0 lb

## 2021-05-09 DIAGNOSIS — R197 Diarrhea, unspecified: Secondary | ICD-10-CM | POA: Insufficient documentation

## 2021-05-09 DIAGNOSIS — N289 Disorder of kidney and ureter, unspecified: Secondary | ICD-10-CM | POA: Diagnosis not present

## 2021-05-09 DIAGNOSIS — I10 Essential (primary) hypertension: Secondary | ICD-10-CM | POA: Diagnosis not present

## 2021-05-09 DIAGNOSIS — I6782 Cerebral ischemia: Secondary | ICD-10-CM | POA: Insufficient documentation

## 2021-05-09 DIAGNOSIS — C9000 Multiple myeloma not having achieved remission: Secondary | ICD-10-CM

## 2021-05-09 DIAGNOSIS — Z79899 Other long term (current) drug therapy: Secondary | ICD-10-CM | POA: Diagnosis not present

## 2021-05-09 DIAGNOSIS — Z5112 Encounter for antineoplastic immunotherapy: Secondary | ICD-10-CM | POA: Diagnosis not present

## 2021-05-09 DIAGNOSIS — G629 Polyneuropathy, unspecified: Secondary | ICD-10-CM | POA: Diagnosis not present

## 2021-05-09 DIAGNOSIS — R4182 Altered mental status, unspecified: Secondary | ICD-10-CM | POA: Insufficient documentation

## 2021-05-09 DIAGNOSIS — E119 Type 2 diabetes mellitus without complications: Secondary | ICD-10-CM | POA: Insufficient documentation

## 2021-05-09 DIAGNOSIS — G893 Neoplasm related pain (acute) (chronic): Secondary | ICD-10-CM | POA: Insufficient documentation

## 2021-05-09 LAB — CMP (CANCER CENTER ONLY)
ALT: 28 U/L (ref 0–44)
AST: 18 U/L (ref 15–41)
Albumin: 4.1 g/dL (ref 3.5–5.0)
Alkaline Phosphatase: 36 U/L — ABNORMAL LOW (ref 38–126)
Anion gap: 12 (ref 5–15)
BUN: 16 mg/dL (ref 6–20)
CO2: 24 mmol/L (ref 22–32)
Calcium: 8.4 mg/dL — ABNORMAL LOW (ref 8.9–10.3)
Chloride: 99 mmol/L (ref 98–111)
Creatinine: 1.28 mg/dL — ABNORMAL HIGH (ref 0.61–1.24)
GFR, Estimated: >60 mL/min (ref >60)
Glucose, Bld: 215 mg/dL — ABNORMAL HIGH (ref 70–99)
Potassium: 4.7 mmol/L (ref 3.5–5.1)
Sodium: 135 mmol/L (ref 135–145)
Total Bilirubin: 0.4 mg/dL (ref 0.3–1.2)
Total Protein: 5.8 g/dL — ABNORMAL LOW (ref 6.5–8.1)

## 2021-05-09 LAB — CBC WITH DIFFERENTIAL (CANCER CENTER ONLY)
Abs Immature Granulocytes: 0.03 10*3/uL (ref 0.00–0.07)
Basophils Absolute: 0.1 10*3/uL (ref 0.0–0.1)
Basophils Relative: 1 %
Eosinophils Absolute: 0.1 10*3/uL (ref 0.0–0.5)
Eosinophils Relative: 3 %
HCT: 38.3 % — ABNORMAL LOW (ref 39.0–52.0)
Hemoglobin: 13.1 g/dL (ref 13.0–17.0)
Immature Granulocytes: 1 %
Lymphocytes Relative: 14 %
Lymphs Abs: 0.7 10*3/uL (ref 0.7–4.0)
MCH: 31.7 pg (ref 26.0–34.0)
MCHC: 34.2 g/dL (ref 30.0–36.0)
MCV: 92.7 fL (ref 80.0–100.0)
Monocytes Absolute: 0.8 10*3/uL (ref 0.1–1.0)
Monocytes Relative: 17 %
Neutro Abs: 3.1 10*3/uL (ref 1.7–7.7)
Neutrophils Relative %: 64 %
Platelet Count: 160 10*3/uL (ref 150–400)
RBC: 4.13 MIL/uL — ABNORMAL LOW (ref 4.22–5.81)
RDW: 14.7 % (ref 11.5–15.5)
WBC Count: 4.8 10*3/uL (ref 4.0–10.5)
nRBC: 0 % (ref 0.0–0.2)

## 2021-05-09 MED ORDER — SODIUM CHLORIDE 0.9 % IV SOLN
Freq: Once | INTRAVENOUS | Status: AC
  Filled 2021-05-09: qty 250

## 2021-05-09 MED ORDER — DIPHENHYDRAMINE HCL 25 MG PO CAPS
50.0000 mg | ORAL_CAPSULE | Freq: Once | ORAL | Status: AC
  Administered 2021-05-09: 50 mg via ORAL
  Filled 2021-05-09: qty 2

## 2021-05-09 MED ORDER — DARATUMUMAB-HYALURONIDASE-FIHJ 1800-30000 MG-UT/15ML ~~LOC~~ SOLN
1800.0000 mg | Freq: Once | SUBCUTANEOUS | Status: AC
  Administered 2021-05-09: 1800 mg via SUBCUTANEOUS
  Filled 2021-05-09: qty 15

## 2021-05-09 MED ORDER — ZOLEDRONIC ACID 4 MG/100ML IV SOLN
4.0000 mg | Freq: Once | INTRAVENOUS | Status: AC
  Administered 2021-05-09: 4 mg via INTRAVENOUS
  Filled 2021-05-09: qty 100

## 2021-05-09 MED ORDER — OXYCODONE-ACETAMINOPHEN 5-325 MG PO TABS: 1.0000 | ORAL_TABLET | Freq: Three times a day (TID) | ORAL | 0 refills | Status: DC | PRN

## 2021-05-09 MED ORDER — DEXAMETHASONE 4 MG PO TABS
20.0000 mg | ORAL_TABLET | Freq: Once | ORAL | Status: AC
  Administered 2021-05-09: 20 mg via ORAL
  Filled 2021-05-09: qty 5

## 2021-05-09 MED ORDER — ACETAMINOPHEN 325 MG PO TABS
650.0000 mg | ORAL_TABLET | Freq: Once | ORAL | Status: AC
  Administered 2021-05-09: 650 mg via ORAL
  Filled 2021-05-09: qty 2

## 2021-05-09 NOTE — Progress Notes (Signed)
Steve Carter   Diagnosis: Multiple myeloma  INTERVAL HISTORY:   Steve Carter returns as scheduled.  He completed another cycle of Pomalidomide beginning 04/08/2021.  He continues monthly daratumumab.  He denies nausea/vomiting.  No mouth sores.  He continues to have intermittent diarrhea.  Neuropathy symptoms in the feet have increased.  He is noting associated pain periodically at nighttime.  He is taking gabapentin managed by neurology.  A few nights ago he took a Percocet tablet with some improvement.  He has a good appetite.  No fever.  No dental issues.  No jaw pain.  Objective:  Vital signs in last 24 hours:  Blood pressure 122/76, pulse 65, temperature 97.7 F (36.5 C), temperature source Oral, resp. rate 18, height 6' 2" (1.88 m), weight 276 lb (125.2 kg), SpO2 98 %.    HEENT: No thrush or ulcers. Resp: Lungs clear bilaterally. Cardio: Regular rate and rhythm. GI: Abdomen soft and nontender.  No hepatomegaly. Vascular: No leg edema.  Skin: Wound at the left great toe is nearly healed.   Lab Results:  Lab Results  Component Value Date   WBC 4.8 05/09/2021   HGB 13.1 05/09/2021   HCT 38.3 (L) 05/09/2021   MCV 92.7 05/09/2021   PLT 160 05/09/2021   NEUTROABS 3.1 05/09/2021    Imaging:  No results found.  Medications: I have reviewed the patient's current medications.  Assessment/Plan: 1.  Multiple myeloma-IgG lambda serum monoclonal protein, elevated serum lambda light chains  Bone survey 10/11/2017-lytic lesions noted in the skull, right iliac, left second rib, and mottled appearance of the proximal femurs/pelvis  Bone marrow biopsy 10/14/2017-hypercellular marrow with plasma cell neoplasm, 82% plasma cells, lambda light chain restricted, hyperdiploid with gains of chromosomes 3, 5, 7, 9, and 11. FISH panel positive for gain of ATM(+11)  Cycle 1 RVD 10/18/2017 (Revlimid started 10/23/2017)  Cycle 2 RVD 11/15/2017  Cycle  3 RVD 12/13/2017  Cycle 4 RVD 01/14/2018  Cycle 5 of RVD 02/14/2018 (Revlimid given for 7 days and 1 week of Velcade), therapy held beginning 02/21/2018 secondary to plan for stem cell therapy  Bone marrow biopsy 02/20/2018, 1-2% plasma cell  PET scan 02/20/2018, no malignant range activity above background, numerous lytic lesions throughout the axial and appendicular skeleton,, left iliac wing fracture  Melphalan, 200 mg/m on 03/24/2018  Stem cell infusion 03/25/2018  Restaging at [facility] 07/09/2018: Normal lambda light chains, no serum M spike, bone marrow biopsy negative for myeloma, less than 1% plasma cells  PET scan at [facility] 07/09/2018-lytic bone lesions, no malignant range activity  Initiation of maintenance Revlimid, 10 mg, 21/28 days 08/15/2018  Cycle 2 maintenance Revlimid 09/12/2018  Cycle 3 maintenance Revlimid 10/10/2018  Revlimid placed on hold 06/15/2019 due to presyncopal/syncopal episodes and diarrhea  Revlimid resumed 5 mg 21 days on/7 days off following office visit 06/29/2019  Revlimidplaced on hold 07/14/2019  Revlimid resumed 08/21/2019  Bone survey 09/09/2019-no acute findings or clear explanation for right buttock pain. Lucent lesions in the right pelvis are stable without pathologic fracture. Evidence of healing lytic lesions in the right scapula, cervical spine spinous processes and left L4 transverse process. Possible mild progression of lytic lesions within the L1 and L4 vertebral bodies. Stable lytic lesions in the calvarium.  PET scan 09/25/2019-new FDG avid bone lesions in the right humeral neck and sacrum similar remaining lytic lesions with FDG activity below background  Bone marrow biopsy at [facility] on 10/01/2019-5% plasma cells on  the bone marrow biopsy suboptimal sample  Cycle 1 daratumumab, pomalidomide, Decadron 10/19/2019  Zometa 10/19/2019  Cycle 2 daratumumab, pomalidomide, Decadron 11/16/2019 (he will begin the pomalidomide  11/17/2019)  Cycle 3 daratumumab, pomalidomide, and Decadron 12/14/2019  Cycle 4 daratumumab, pomalidomide, and Decadron 01/11/2020  Cycle 5 daratumumab, pomalidomide, Decadron 02/03/2020 (he began pomalidomide on 02/06/2020)  Cycle 6 daratumumab, pomalidomide, Decadron 03/02/2020 (pomalidomide starting 03/12/2020)  Cycle 7 daratumumab, pomalidomide, Decadron 03/29/2020 (pomalidomide scheduled to start 04/15/2020)  Cycle 8 daratumumab, pomalidomide, Decadron 05/04/2020 (pomalidomide started 05/14/2020)  Cycle 9 daratumumab-monthly 06/15/2020, pomalidomide held  Cycle 10 daratumumab 07/13/2020, pomalidomide 2 mg 21 days beginning 07/15/2020  Cycle 11 daratumumab 08/10/2020, pomalidomide 2 mg 21 days beginning 08/12/2020  Bone marrow biopsy 09/06/2020-hypocellular bone marrow with a relative erythroid hyperplasia and no increase in plasma cells; MRD 0.0050%  Cycle 12 daratumumab10/05/2020, pomalidomide 2 mg 21 days beginning 09/09/2020  Cycle 13 daratumumab 10/11/2020, pomalidomide 2 mg 21 days beginning 10/10/2020  Cycle 14 daratumumab 11/08/2020, pomalidomide reduced to 1 mg 21 days  Cycle 15 daratumumab 12/14/2020, Pomalidomide 1 mg 21 days beginning 12/13/2020  Cycle 16 daratumumab 01/12/2021, pomalidomide 1 mg 21 days beginning 01/11/2021  Cycle 17 daratumumab 02/10/2021, pomalidomide 1 mg 21 days beginning 02/09/2021  Cycle 18 daratumumab 03/10/2021, pomalidomide 1 mg 21 days beginning 03/11/2021  Cycle 19 daratumumab 04/07/2021, Pomalidomide 1 mg 21 days beginning 04/08/2021  Cycle 20 daratumumab 05/09/2021, Pomalidomide 1 mg 21 days beginning 05/12/2021   2.Pain secondary to multiple myeloma involving the spine and pelvis-resolved  MRI of the lumbar spine 10/02/2018-numerous rounded foci in the vertebral bodies, hypertrophy of the L4 transverse process  3.Hypertension-losartan dose increased 11/15/2017  4.Depression-improved with Wellbutrin beginning September 2020  5.Altered mental  status-improved depression related? 6.Diabetes 7. Recurrent episodes of fall/syncope-etiology unclear-evaluated by neurology at [facility], MRI brain 01/25/2020 with abnormal signal at the left superior frontal gyrus-potentially representing a low-grade glioma, scattered foci of increased T2 white matter signal-likely related to chronic small vessel disease  Stereotactic brain biopsy at [facility] 03/14/2020-gliosis, no evidence of malignancy  8.Peripheral neuropathy-gabapentin started 07/07/2020; reports increase in symptoms especially at nighttime 04/07/2021  9.Renal insufficiency   Disposition: [name] appears unchanged.  He continues monthly daratumumab and pomalidomide 1 mg 21 days on/7 days off.  He is scheduled for Zometa today.  He has increased peripheral neuropathy symptoms, some associated pain in the feet.  He is on gabapentin, followed by neurology.  We refilled a Percocet prescription for him today.  He will follow-up with neurology regarding the increase in symptoms.  We reviewed the CBC from today.  Counts adequate to proceed with treatment.  He will return for lab, follow-up, daratumumab in approximately 4 weeks.  He will contact the office in the interim with any problems.    [name] ANP/GNP-BC   05/09/2021  9:27 AM

## 2021-05-09 NOTE — Patient Instructions (Signed)
[facility]  Discharge Instructions: Thank you for choosing [facility] to provide your oncology and hematology care.   If you have a lab appointment with the [facility], please go directly to the [facility] and check in at the registration area.   Wear comfortable clothing and clothing appropriate for easy access to any Portacath or PICC line.   We strive to give you quality time with your provider. You may need to reschedule your appointment if you arrive late (15 or more minutes).  Arriving late affects you and other patients whose appointments are after yours.  Also, if you miss three or more appointments without notifying the office, you may be dismissed from the clinic at the provider's discretion.      For prescription refill requests, have your pharmacy contact our office and allow 72 hours for refills to be completed.    Today you received the following chemotherapy and/or immunotherapy agents: darzalex faspro, zometa.      To help prevent nausea and vomiting after your treatment, we encourage you to take your nausea medication as directed.  BELOW ARE SYMPTOMS THAT SHOULD BE REPORTED IMMEDIATELY: . *FEVER GREATER THAN 100.4 F (38 C) OR HIGHER . *CHILLS OR SWEATING . *NAUSEA AND VOMITING THAT IS NOT CONTROLLED WITH YOUR NAUSEA MEDICATION . *UNUSUAL SHORTNESS OF BREATH . *UNUSUAL BRUISING OR BLEEDING . *URINARY PROBLEMS (pain or burning when urinating, or frequent urination) . *BOWEL PROBLEMS (unusual diarrhea, constipation, pain near the anus) . TENDERNESS IN MOUTH AND THROAT WITH OR WITHOUT PRESENCE OF ULCERS (sore throat, sores in mouth, or a toothache) . UNUSUAL RASH, SWELLING OR PAIN  . UNUSUAL VAGINAL DISCHARGE OR ITCHING   Items with * indicate a potential emergency and should be followed up as soon as possible or go to the Emergency Department if any problems should occur.  Please show the CHEMOTHERAPY ALERT CARD or  IMMUNOTHERAPY ALERT CARD at check-in to the Emergency Department and triage nurse.  Should you have questions after your visit or need to cancel or reschedule your appointment, please contact [facility]  Dept: [phone]  and follow the prompts.  Office hours are 8:00 a.m. to 4:30 p.m. Monday - Friday. Please note that voicemails left after 4:00 p.m. may not be returned until the following business day.  We are closed weekends and major holidays. You have access to a nurse at all times for urgent questions. Please call the main number to the clinic Dept: [phone] and follow the prompts.   For any non-urgent questions, you may also contact your provider using MyChart. We now offer e-Visits for anyone 18 and older to request care online for non-urgent symptoms. For details visit [email or web address].   Also download the MyChart app! Go to the app store, search "MyChart", open the app, select [facility], and log in with your MyChart username and password.  Due to Covid, a mask is required upon entering the hospital/clinic. If you do not have a mask, one will be given to you upon arrival. For doctor visits, patients may have 1 support person aged 23 or older with them. For treatment visits, patients cannot have anyone with them due to current Covid guidelines and our immunocompromised population.   Zoledronic Acid Injection (Hypercalcemia, Oncology) What is this medicine? ZOLEDRONIC ACID (ZOE le dron ik AS id) slows calcium loss from bones. It high calcium levels in the blood from some kinds of cancer. It may be  used in other people at risk for bone loss. This medicine may be used for other purposes; ask your health care provider or pharmacist if you have questions. COMMON BRAND NAME(S): Zometa What should I tell my health care provider before I take this medicine? They need to know if you have any of these conditions:  cancer  dehydration  dental  disease  kidney disease  liver disease  low levels of calcium in the blood  lung or breathing disease (asthma)  receiving steroids like dexamethasone or prednisone  an unusual or allergic reaction to zoledronic acid, other medicines, foods, dyes, or preservatives  pregnant or trying to get pregnant  breast-feeding How should I use this medicine? This drug is injected into a vein. It is given by a health care provider in a hospital or clinic setting. Talk to your health care provider about the use of this drug in children. Special care may be needed. Overdosage: If you think you have taken too much of this medicine contact a poison control center or emergency room at once. NOTE: This medicine is only for you. Do not share this medicine with others. What if I miss a dose? Keep appointments for follow-up doses. It is important not to miss your dose. Call your health care provider if you are unable to keep an appointment. What may interact with this medicine?  certain antibiotics given by injection  NSAIDs, medicines for pain and inflammation, like ibuprofen or naproxen  some diuretics like bumetanide, furosemide  teriparatide  thalidomide This list may not describe all possible interactions. Give your health care provider a list of all the medicines, herbs, non-prescription drugs, or dietary supplements you use. Also tell them if you smoke, drink alcohol, or use illegal drugs. Some items may interact with your medicine. What should I watch for while using this medicine? Visit your health care provider for regular checks on your progress. It may be some time before you see the benefit from this drug. Some people who take this drug have severe bone, joint, or muscle pain. This drug may also increase your risk for jaw problems or a broken thigh bone. Tell your health care provider right away if you have severe pain in your jaw, bones, joints, or muscles. Tell you health care provider  if you have any pain that does not go away or that gets worse. Tell your dentist and dental surgeon that you are taking this drug. You should not have major dental surgery while on this drug. See your dentist to have a dental exam and fix any dental problems before starting this drug. Take good care of your teeth while on this drug. Make sure you see your dentist for regular follow-up appointments. You should make sure you get enough calcium and vitamin D while you are taking this drug. Discuss the foods you eat and the vitamins you take with your health care provider. Check with your health care provider if you have severe diarrhea, nausea, and vomiting, or if you sweat a lot. The loss of too much body fluid may make it dangerous for you to take this drug. You may need blood work done while you are taking this drug. Do not become pregnant while taking this drug. Women should inform their health care provider if they wish to become pregnant or think they might be pregnant. There is potential for serious harm to an unborn child. Talk to your health care provider for more information. What side effects may I  notice from receiving this medicine? Side effects that you should report to your doctor or health care provider as soon as possible:  allergic reactions (skin rash, itching or hives; swelling of the face, lips, or tongue)  bone pain  infection (fever, chills, cough, sore throat, pain or trouble passing urine)  jaw pain, especially after dental work  joint pain  kidney injury (trouble passing urine or change in the amount of urine)  low blood pressure (dizziness; feeling faint or lightheaded, falls; unusually weak or tired)  low calcium levels (fast heartbeat; muscle cramps or pain; pain, tingling, or numbness in the hands or feet; seizures)  low magnesium levels (fast, irregular heartbeat; muscle cramp or pain; muscle weakness; tremors; seizures)  low red blood cell counts (trouble  breathing; feeling faint; lightheaded, falls; unusually weak or tired)  muscle pain  redness, blistering, peeling, or loosening of the skin, including inside the mouth  severe diarrhea  swelling of the ankles, feet, hands  trouble breathing Side effects that usually do not require medical attention (report to your doctor or health care provider if they continue or are bothersome):  anxious  constipation  coughing  depressed mood  eye irritation, itching, or pain  fever  general ill feeling or flu-like symptoms  nausea  pain, redness, or irritation at site where injected  trouble sleeping This list may not describe all possible side effects. Call your doctor for medical advice about side effects. You may report side effects to FDA at 1-800-FDA-1088. Where should I keep my medicine? This drug is given in a hospital or clinic. It will not be stored at home. NOTE: This sheet is a summary. It may not cover all possible information. If you have questions about this medicine, talk to your doctor, pharmacist, or health care provider.  2021 Elsevier/Gold Standard (2019-09-03 09:13:00)

## 2021-05-15 LAB — IFE, DARA-SPECIFIC, SERUM
IgA: 54 mg/dL — ABNORMAL LOW (ref 90–386)
IgG (Immunoglobin G), Serum: 308 mg/dL — ABNORMAL LOW (ref 603–1613)
IgM (Immunoglobulin M), Srm: 7 mg/dL — ABNORMAL LOW (ref 20–172)

## 2021-05-31 ENCOUNTER — Other Ambulatory Visit: Payer: Self-pay | Admitting: Oncology

## 2021-06-01 ENCOUNTER — Other Ambulatory Visit: Payer: Self-pay | Admitting: *Deleted

## 2021-06-01 MED ORDER — POMALIDOMIDE 1 MG PO CAPS: ORAL_CAPSULE | ORAL | 0 refills | Status: DC

## 2021-06-02 ENCOUNTER — Encounter: Payer: Self-pay | Admitting: Oncology

## 2021-06-05 ENCOUNTER — Other Ambulatory Visit: Payer: Self-pay | Admitting: Oncology

## 2021-06-07 ENCOUNTER — Inpatient Hospital Stay: Payer: Medicare Other

## 2021-06-07 ENCOUNTER — Other Ambulatory Visit: Payer: Self-pay

## 2021-06-07 ENCOUNTER — Inpatient Hospital Stay: Payer: Medicare Other | Attending: Nurse Practitioner

## 2021-06-07 ENCOUNTER — Other Ambulatory Visit: Payer: Self-pay | Admitting: Oncology

## 2021-06-07 ENCOUNTER — Other Ambulatory Visit: Payer: Self-pay | Admitting: Adult Health

## 2021-06-07 ENCOUNTER — Inpatient Hospital Stay (HOSPITAL_BASED_OUTPATIENT_CLINIC_OR_DEPARTMENT_OTHER): Payer: Medicare Other | Admitting: Oncology

## 2021-06-07 VITALS — BP 122/77 | HR 71 | Temp 97.8°F | Resp 18 | Ht 74.0 in | Wt 271.0 lb

## 2021-06-07 DIAGNOSIS — E119 Type 2 diabetes mellitus without complications: Secondary | ICD-10-CM | POA: Diagnosis not present

## 2021-06-07 DIAGNOSIS — G893 Neoplasm related pain (acute) (chronic): Secondary | ICD-10-CM | POA: Diagnosis not present

## 2021-06-07 DIAGNOSIS — C9000 Multiple myeloma not having achieved remission: Secondary | ICD-10-CM

## 2021-06-07 DIAGNOSIS — I10 Essential (primary) hypertension: Secondary | ICD-10-CM | POA: Insufficient documentation

## 2021-06-07 DIAGNOSIS — Z79899 Other long term (current) drug therapy: Secondary | ICD-10-CM | POA: Insufficient documentation

## 2021-06-07 DIAGNOSIS — I6782 Cerebral ischemia: Secondary | ICD-10-CM | POA: Diagnosis not present

## 2021-06-07 DIAGNOSIS — R197 Diarrhea, unspecified: Secondary | ICD-10-CM | POA: Insufficient documentation

## 2021-06-07 DIAGNOSIS — F331 Major depressive disorder, recurrent, moderate: Secondary | ICD-10-CM

## 2021-06-07 DIAGNOSIS — R4182 Altered mental status, unspecified: Secondary | ICD-10-CM | POA: Diagnosis not present

## 2021-06-07 DIAGNOSIS — N289 Disorder of kidney and ureter, unspecified: Secondary | ICD-10-CM | POA: Insufficient documentation

## 2021-06-07 DIAGNOSIS — G620 Drug-induced polyneuropathy: Secondary | ICD-10-CM | POA: Diagnosis not present

## 2021-06-07 DIAGNOSIS — F411 Generalized anxiety disorder: Secondary | ICD-10-CM

## 2021-06-07 DIAGNOSIS — Z5112 Encounter for antineoplastic immunotherapy: Secondary | ICD-10-CM | POA: Insufficient documentation

## 2021-06-07 LAB — CBC WITH DIFFERENTIAL (CANCER CENTER ONLY)
Abs Immature Granulocytes: 0.06 10*3/uL (ref 0.00–0.07)
Basophils Absolute: 0.1 10*3/uL (ref 0.0–0.1)
Basophils Relative: 2 %
Eosinophils Absolute: 0.2 10*3/uL (ref 0.0–0.5)
Eosinophils Relative: 4 %
HCT: 38 % — ABNORMAL LOW (ref 39.0–52.0)
Hemoglobin: 13.2 g/dL (ref 13.0–17.0)
Immature Granulocytes: 1 %
Lymphocytes Relative: 16 %
Lymphs Abs: 0.7 10*3/uL (ref 0.7–4.0)
MCH: 32.4 pg (ref 26.0–34.0)
MCHC: 34.7 g/dL (ref 30.0–36.0)
MCV: 93.4 fL (ref 80.0–100.0)
Monocytes Absolute: 0.8 10*3/uL (ref 0.1–1.0)
Monocytes Relative: 19 %
Neutro Abs: 2.4 10*3/uL (ref 1.7–7.7)
Neutrophils Relative %: 58 %
Platelet Count: 211 10*3/uL (ref 150–400)
RBC: 4.07 MIL/uL — ABNORMAL LOW (ref 4.22–5.81)
RDW: 14.6 % (ref 11.5–15.5)
WBC Count: 4.2 10*3/uL (ref 4.0–10.5)
nRBC: 0 % (ref 0.0–0.2)

## 2021-06-07 LAB — CMP (CANCER CENTER ONLY)
ALT: 32 U/L (ref 0–44)
AST: 20 U/L (ref 15–41)
Albumin: 4.3 g/dL (ref 3.5–5.0)
Alkaline Phosphatase: 44 U/L (ref 38–126)
Anion gap: 12 (ref 5–15)
BUN: 13 mg/dL (ref 6–20)
CO2: 25 mmol/L (ref 22–32)
Calcium: 9 mg/dL (ref 8.9–10.3)
Chloride: 100 mmol/L (ref 98–111)
Creatinine: 1.22 mg/dL (ref 0.61–1.24)
GFR, Estimated: >60 mL/min (ref >60)
Glucose, Bld: 154 mg/dL — ABNORMAL HIGH (ref 70–99)
Potassium: 4.2 mmol/L (ref 3.5–5.1)
Sodium: 137 mmol/L (ref 135–145)
Total Bilirubin: 0.3 mg/dL (ref 0.3–1.2)
Total Protein: 6.5 g/dL (ref 6.5–8.1)

## 2021-06-07 MED ORDER — OXYCODONE-ACETAMINOPHEN 5-325 MG PO TABS: 1.0000 | ORAL_TABLET | Freq: Three times a day (TID) | ORAL | 0 refills | Status: DC | PRN

## 2021-06-07 MED ORDER — DARATUMUMAB-HYALURONIDASE-FIHJ 1800-30000 MG-UT/15ML ~~LOC~~ SOLN
1800.0000 mg | Freq: Once | SUBCUTANEOUS | Status: AC
  Administered 2021-06-07: 1800 mg via SUBCUTANEOUS
  Filled 2021-06-07: qty 15

## 2021-06-07 MED ORDER — DIPHENHYDRAMINE HCL 25 MG PO CAPS
50.0000 mg | ORAL_CAPSULE | Freq: Once | ORAL | Status: AC
  Administered 2021-06-07: 50 mg via ORAL
  Filled 2021-06-07: qty 2

## 2021-06-07 MED ORDER — DEXAMETHASONE 4 MG PO TABS
20.0000 mg | ORAL_TABLET | Freq: Once | ORAL | Status: AC
  Administered 2021-06-07: 20 mg via ORAL
  Filled 2021-06-07: qty 5

## 2021-06-07 MED ORDER — SODIUM CHLORIDE 0.9 % IV SOLN
Freq: Once | INTRAVENOUS | Status: DC
  Filled 2021-06-07: qty 250

## 2021-06-07 MED ORDER — ACETAMINOPHEN 325 MG PO TABS
650.0000 mg | ORAL_TABLET | Freq: Once | ORAL | Status: AC
  Administered 2021-06-07: 650 mg via ORAL
  Filled 2021-06-07: qty 2

## 2021-06-07 NOTE — Patient Instructions (Signed)
[facility]   Discharge Instructions: Thank you for choosing [facility] to provide your oncology and hematology care.   If you have a lab appointment with the [facility], please go directly to the [facility] and check in at the registration area.   Wear comfortable clothing and clothing appropriate for easy access to any Portacath or PICC line.   We strive to give you quality time with your provider. You may need to reschedule your appointment if you arrive late (15 or more minutes).  Arriving late affects you and other patients whose appointments are after yours.  Also, if you miss three or more appointments without notifying the office, you may be dismissed from the clinic at the provider's discretion.      For prescription refill requests, have your pharmacy contact our office and allow 72 hours for refills to be completed.    Today you received the following chemotherapy and/or immunotherapy agents Daratumumab-hyaluronisae-fihj (DARZALEX FASPRO).      To help prevent nausea and vomiting after your treatment, we encourage you to take your nausea medication as directed.  BELOW ARE SYMPTOMS THAT SHOULD BE REPORTED IMMEDIATELY: *FEVER GREATER THAN 100.4 F (38 C) OR HIGHER *CHILLS OR SWEATING *NAUSEA AND VOMITING THAT IS NOT CONTROLLED WITH YOUR NAUSEA MEDICATION *UNUSUAL SHORTNESS OF BREATH *UNUSUAL BRUISING OR BLEEDING *URINARY PROBLEMS (pain or burning when urinating, or frequent urination) *BOWEL PROBLEMS (unusual diarrhea, constipation, pain near the anus) TENDERNESS IN MOUTH AND THROAT WITH OR WITHOUT PRESENCE OF ULCERS (sore throat, sores in mouth, or a toothache) UNUSUAL RASH, SWELLING OR PAIN  UNUSUAL VAGINAL DISCHARGE OR ITCHING   Items with * indicate a potential emergency and should be followed up as soon as possible or go to the Emergency Department if any problems should occur.  Please show the CHEMOTHERAPY ALERT CARD or  IMMUNOTHERAPY ALERT CARD at check-in to the Emergency Department and triage nurse.  Should you have questions after your visit or need to cancel or reschedule your appointment, please contact [facility]  Dept: [phone]  and follow the prompts.  Office hours are 8:00 a.m. to 4:30 p.m. Monday - Friday. Please note that voicemails left after 4:00 p.m. may not be returned until the following business day.  We are closed weekends and major holidays. You have access to a nurse at all times for urgent questions. Please call the main number to the clinic Dept: [phone] and follow the prompts.   For any non-urgent questions, you may also contact your provider using MyChart. We now offer e-Visits for anyone 30 and older to request care online for non-urgent symptoms. For details visit [email or web address].   Also download the MyChart app! Go to the app store, search "MyChart", open the app, select [facility], and log in with your MyChart username and password.  Due to Covid, a mask is required upon entering the hospital/clinic. If you do not have a mask, one will be given to you upon arrival. For doctor visits, patients may have 1 support person aged 38 or older with them. For treatment visits, patients cannot have anyone with them due to current Covid guidelines and our immunocompromised population.   Daratumumab; Hyaluronidase Injection What is this medication? DARATUMUMAB; HYALURONIDASE (dar a toom ue mab / hye al ur ON i dase) is a monoclonal antibody. Hyaluronidase is used to improve the effects of daratumumab. It treats certain types of cancer. Some of the cancers treated aremultiple  myeloma and light-chain amyloidosis. This medicine may be used for other purposes; ask your health care provider orpharmacist if you have questions. COMMON BRAND NAME(S): DARZALEX FASPRO What should I tell my care team before I take this medication? They need to know if you have any  of these conditions: heart disease infection especially a viral infection such as chickenpox, cold sores, herpes, or hepatitis B lung or breathing disease an unusual or allergic reaction to daratumumab, hyaluronidase, other medicines, foods, dyes, or preservatives pregnant or trying to get pregnant breast-feeding How should I use this medication? This medicine is for injection under the skin. It is given by a health careprofessional in a hospital or clinic setting. Talk to your pediatrician regarding the use of this medicine in children.Special care may be needed. Overdosage: If you think you have taken too much of this medicine contact apoison control center or emergency room at once. NOTE: This medicine is only for you. Do not share this medicine with others. What if I miss a dose? Keep appointments for follow-up doses as directed. It is important not to miss your dose. Call your doctor or health care professional if you are unable tokeep an appointment. What may interact with this medication? Interactions have not been studied. This list may not describe all possible interactions. Give your health care provider a list of all the medicines, herbs, non-prescription drugs, or dietary supplements you use. Also tell them if you smoke, drink alcohol, or use illegaldrugs. Some items may interact with your medicine. What should I watch for while using this medication? Your condition will be monitored carefully while you are receiving thismedicine. This medicine can cause serious allergic reactions. To reduce your risk, your health care provider may give you other medicine to take before receiving thisone. Be sure to follow the directions from your health care provider. This medicine can affect the results of blood tests to match your blood type. These changes can last for up to 6 months after the final dose. Your healthcare provider will do blood tests to match your blood type before you start  treatment. Tell all of your healthcare providers that you are being treatedwith this medicine before receiving a blood transfusion. This medicine can affect the results of some tests used to determine treatmentresponse; extra tests may be needed to evaluate response. Do not become pregnant while taking this medicine or for 3 months after stopping it. Women should inform their health care provider if they wish to become pregnant or think they might be pregnant. There is a potential for serious side effects to an unborn child. Talk to your health care provider formore information. Do not breast-feed an infant while taking this medicine. What side effects may I notice from receiving this medication? Side effects that you should report to your doctor or health care professionalas soon as possible: allergic reactions like skin rash, itching or hives, swelling of the face, lips, or tongue blurred vision breathing problems chest pain cough dizziness fast heartbeat feeling faint or lightheaded headache low blood counts - this medicine may decrease the number of white blood cells, red blood cells and platelets. You may be at increased risk for infections and bleeding. nausea, vomiting shortness of breath signs of decreased platelets or bleeding - bruising, pinpoint red spots on the skin, black, tarry stools, blood in the urine signs of decreased red blood cells - unusually weak or tired, feeling faint or lightheaded, falls signs of infection - fever or chills, cough, sore throat,  pain or difficulty passing urine Side effects that usually do not require medical attention (report these toyour doctor or health care professional if they continue or are bothersome): back pain constipation diarrhea pain, tingling, numbness in the hands or feet muscle cramps swelling of the ankles, feet, hands tiredness trouble sleeping This list may not describe all possible side effects. Call your doctor for medical  advice about side effects. You may report side effects to FDA at1-800-FDA-1088. Where should I keep my medication? This drug is given in a hospital or clinic and will not be stored at home. NOTE: This sheet is a summary. It may not cover all possible information. If you have questions about this medicine, talk to your doctor, pharmacist, orhealth care provider.  2022 Elsevier/Gold Standard (2020-12-29 12:51:54)

## 2021-06-07 NOTE — Progress Notes (Signed)
[facility] OFFICE PROGRESS NOTE   Diagnosis: Multiple myeloma  INTERVAL HISTORY:   [name] returns as scheduled.  He was last treated with daratumumab on 05/09/2021.  He began another cycle of pomalidomide on 05/12/2021.  He reports increased numbness in the feet.  He now has numbness in the right fingers.  This does not interfere with activity.  He continues to have diarrhea.  No pain except for foot pain at night.  No other complaint.  Objective:  Vital signs in last 24 hours:  Blood pressure 122/77, pulse 71, temperature 97.8 F (36.6 C), temperature source Oral, resp. rate 18, height $RemoveBe'6\' 2"'[id number]$  (1.88 m), weight 271 lb (122.9 kg), SpO2 100 %.    HEENT: No thrush or ulcers Resp: Lungs clear bilaterally Cardio: Regular rate and rhythm GI: No hepatosplenomegaly Vascular: No leg edema  Lab Results:  Lab Results  Component Value Date   WBC 4.2 06/07/2021   HGB 13.2 06/07/2021   HCT 38.0 (L) 06/07/2021   MCV 93.4 06/07/2021   PLT 211 06/07/2021   NEUTROABS 2.4 06/07/2021    CMP  Lab Results  Component Value Date   NA 137 06/07/2021   K 4.2 06/07/2021   CL 100 06/07/2021   CO2 25 06/07/2021   GLUCOSE 154 (H) 06/07/2021   BUN 13 06/07/2021   CREATININE 1.22 06/07/2021   CALCIUM 9.0 06/07/2021   PROT 6.5 06/07/2021   ALBUMIN 4.3 06/07/2021   AST 20 06/07/2021   ALT 32 06/07/2021   ALKPHOS 44 06/07/2021   BILITOT 0.3 06/07/2021   GFRNONAA >60 06/07/2021   GFRAA >60 08/10/2020     Medications: I have reviewed the patient's current medications.   Assessment/Plan:   1. Multiple myeloma- IgG lambda serum monoclonal protein, elevated serum lambda light chains Bone survey 10/11/2017-lytic lesions noted in the skull, right iliac, left second rib, and mottled appearance of the proximal femurs/pelvis Bone marrow biopsy 10/14/2017-hypercellular marrow with plasma cell neoplasm, 82% plasma cells, lambda light chain restricted, hyperdiploid with gains of  chromosomes 3, 5, 7, 9, and 11.  FISH panel positive for gain of ATM (+11) Cycle 1 RVD 10/18/2017 (Revlimid started 10/23/2017) Cycle 2 RVD 11/15/2017 Cycle 3 RVD 12/13/2017 Cycle 4 RVD 01/14/2018 Cycle 5 of RVD 02/14/2018 (Revlimid given for 7 days and 1 week of Velcade), therapy held beginning 02/21/2018 secondary to plan for stem cell therapy Bone marrow biopsy 02/20/2018, 1-2% plasma cell PET scan 02/20/2018, no malignant range activity above background, numerous lytic lesions throughout the axial and appendicular skeleton,, left iliac wing fracture Melphalan, 200 mg/m on 03/24/2018 Stem cell infusion 03/25/2018 Restaging at [facility] 07/09/2018: Normal lambda light chains, no serum M spike, bone marrow biopsy negative for myeloma, less than 1% plasma cells PET scan at [facility] 07/09/2018- lytic bone lesions, no malignant range activity Initiation of maintenance Revlimid, 10 mg, 21/28 days 08/15/2018 Cycle 2 maintenance Revlimid 09/12/2018 Cycle 3 maintenance Revlimid 10/10/2018 Revlimid placed on hold 06/15/2019 due to presyncopal/syncopal episodes and diarrhea Revlimid resumed 5 mg 21 days on/7 days off following office visit 06/29/2019 Revlimid placed on hold 07/14/2019 Revlimid resumed 08/21/2019 Bone survey 09/09/2019- no acute findings or clear explanation for right buttock pain.  Lucent lesions in the right pelvis are stable without pathologic fracture.  Evidence of healing lytic lesions in the right scapula, cervical spine spinous processes and left L4 transverse process.  Possible mild progression of lytic lesions within the L1 and L4 vertebral bodies.  Stable lytic lesions in  the calvarium. PET scan 09/25/2019-new FDG avid bone lesions in the right humeral neck and sacrum similar remaining lytic lesions with FDG activity below background Bone marrow biopsy at [facility] on 10/01/2019-5% plasma cells on the bone marrow biopsy suboptimal sample  Cycle 1 daratumumab, pomalidomide, Decadron  10/19/2019 Zometa 10/19/2019 Cycle 2 daratumumab, pomalidomide, Decadron 11/16/2019 (he will begin the pomalidomide 11/17/2019) Cycle 3 daratumumab, pomalidomide, and Decadron 12/14/2019 Cycle 4 daratumumab, pomalidomide, and Decadron 01/11/2020 Cycle 5 daratumumab, pomalidomide, Decadron 02/03/2020 (he began pomalidomide on 02/06/2020) Cycle 6 daratumumab, pomalidomide, Decadron 03/02/2020 (pomalidomide starting 03/12/2020) Cycle 7 daratumumab, pomalidomide, Decadron 03/29/2020 (pomalidomide scheduled to start 04/15/2020) Cycle 8 daratumumab, pomalidomide, Decadron 05/04/2020 (pomalidomide started 05/14/2020) Cycle 9 daratumumab-monthly 06/15/2020, pomalidomide held Cycle 10 daratumumab 07/13/2020, pomalidomide 2 mg 21 days beginning 07/15/2020 Cycle 11 daratumumab 08/10/2020, pomalidomide 2 mg 21 days beginning 08/12/2020 Bone marrow biopsy 09/06/2020-hypocellular bone marrow with a relative erythroid hyperplasia and no increase in plasma cells; MRD 0.0050% Cycle 12 daratumumab 09/07/2020, pomalidomide 2 mg 21 days beginning 09/09/2020 Cycle 13 daratumumab 10/11/2020, pomalidomide 2 mg 21 days beginning 10/10/2020 Cycle 14 daratumumab 11/08/2020, pomalidomide reduced to 1 mg 21 days Cycle 15 daratumumab 12/14/2020, Pomalidomide 1 mg 21 days beginning 12/13/2020 Cycle 16 daratumumab 01/12/2021, pomalidomide 1 mg 21 days beginning 01/11/2021 Cycle 17 daratumumab 02/10/2021, pomalidomide 1 mg 21 days beginning 02/09/2021 Cycle 18 daratumumab 03/10/2021, pomalidomide 1 mg 21 days beginning 03/11/2021 Cycle 19 daratumumab 04/07/2021, Pomalidomide 1 mg 21 days beginning 04/08/2021 Cycle 20 daratumumab 05/09/2021, Pomalidomide 1 mg 21 days beginning 05/12/2021 Cycle 21 daratumumab 06/07/2021, pomalidomide held secondary to progressive neuropathy and diarrhea     2.  Pain secondary to multiple myeloma involving the spine and pelvis-resolved MRI of the lumbar spine 10/02/2018- numerous rounded foci in the vertebral bodies, hypertrophy of the  L4 transverse process   3.  Hypertension-losartan dose increased 11/15/2017   4.  Depression-improved with Wellbutrin beginning September 2020   5.  Altered mental status-improved depression related? 6. Diabetes 7.  Recurrent episodes of fall/syncope-etiology unclear-evaluated by neurology at [facility], MRI brain 01/25/2020 with abnormal signal at the left superior frontal gyrus-potentially representing a low-grade glioma, scattered foci of increased T2 white matter signal-likely related to chronic small vessel disease Stereotactic brain biopsy at [facility] 03/14/2020-gliosis, no evidence of malignancy   8.  Peripheral neuropathy-gabapentin started 07/07/2020; reports increase in symptoms 06/07/2021-pomalidomide placed on hold   9.  Renal insufficiency    Disposition: [name] has multiple myeloma.  He has a low level detectable serum M protein.  No clinical evidence of disease progression.  He has been maintained on daratumumab/pomalidomide/Decadron since November 2020.  We will place pomalidomide on hold secondary to neuropathy and diarrhea.  He will continue monthly daratumumab/Decadron.  [name] will return for an office visit in 1 month.  He has a neurology appointment at [facility] next month.  I refilled his prescription for oxycodone.  Betsy Coder, MD  06/07/2021  9:51 AM

## 2021-06-08 ENCOUNTER — Encounter: Payer: Self-pay | Admitting: Oncology

## 2021-06-08 LAB — KAPPA/LAMBDA LIGHT CHAINS
Kappa free light chain: 6.8 mg/L (ref 3.3–19.4)
Kappa, lambda light chain ratio: 0.5 (ref 0.26–1.65)
Lambda free light chains: 13.6 mg/L (ref 5.7–26.3)

## 2021-06-08 LAB — IGG: IgG (Immunoglobin G), Serum: 317 mg/dL — ABNORMAL LOW (ref 603–1613)

## 2021-06-09 NOTE — Telephone Encounter (Signed)
Please schedule appt

## 2021-06-13 ENCOUNTER — Encounter: Payer: Self-pay | Admitting: Oncology

## 2021-06-13 NOTE — Telephone Encounter (Signed)
Left a message for pt  to schedule

## 2021-06-14 ENCOUNTER — Encounter: Payer: Self-pay | Admitting: Oncology

## 2021-06-15 ENCOUNTER — Encounter: Payer: Self-pay | Admitting: Oncology

## 2021-06-15 ENCOUNTER — Other Ambulatory Visit: Payer: Self-pay

## 2021-06-15 ENCOUNTER — Encounter: Payer: Self-pay | Admitting: Adult Health

## 2021-06-15 ENCOUNTER — Ambulatory Visit (INDEPENDENT_AMBULATORY_CARE_PROVIDER_SITE_OTHER): Payer: Medicare Other | Admitting: Adult Health

## 2021-06-15 DIAGNOSIS — F331 Major depressive disorder, recurrent, moderate: Secondary | ICD-10-CM | POA: Diagnosis not present

## 2021-06-15 DIAGNOSIS — F411 Generalized anxiety disorder: Secondary | ICD-10-CM

## 2021-06-15 DIAGNOSIS — G4700 Insomnia, unspecified: Secondary | ICD-10-CM | POA: Diagnosis not present

## 2021-06-15 MED ORDER — BUPROPION HCL ER (XL) 150 MG PO TB24: 450.0000 mg | ORAL_TABLET | Freq: Every morning | ORAL | 3 refills | Status: DC

## 2021-06-15 MED ORDER — ARIPIPRAZOLE 15 MG PO TABS: 15.0000 mg | ORAL_TABLET | Freq: Every day | ORAL | 3 refills | Status: DC

## 2021-06-15 NOTE — Progress Notes (Signed)
Steve Carter [id number] [birth year] 55 y.o.  Subjective:   Patient ID:  Steve Carter is a 55 y.o. (DOB [birth year]) male.  Chief Complaint: No chief complaint on file.   HPI Steve Carter presents to the office today for follow-up of depression, anxiety, insomnia, and irritability  HPI:  Describes mood today as "ok". Pleasant. Mood symptoms - reports depression, anxiety, and irritability. Not worrying as much. Stating "I'm not doing as well as I would like to be". Feels bad "physically". Chemotherapy injection monthly - "leaves me weak and fatigued". Decreased energy and motivation. Taking current medications as prescribed. Energy levels low - "I don't have any energy at all". Active, unable to establish a regular exercise routine with physical disabilities. Enjoys some usual interests and activities. Spending time with family wife - and 2 children.  Appetite adequate. Weight fluctuates - 276 pounds. Sleeps well most nights. Averages 5 hours. Reports waking up throughout the night. Napping during the day. Focus and concentration "not that great". Completing tasks. Managing aspects of household. Out of work for the past 2 years. Denies SI or HI.  Denies AH or VH.      Review of Systems:  Review of Systems  Musculoskeletal:  Negative for gait problem.  Neurological:  Negative for tremors.  Psychiatric/Behavioral:         Please refer to HPI   Medications: I have reviewed the patient's current medications.  Current Outpatient Medications  Medication Sig Dispense Refill   acyclovir (ZOVIRAX) 400 MG tablet TAKE 1 TABLET BY MOUTH TWICE A DAY 180 tablet 1   ARIPiprazole (ABILIFY) 15 MG tablet Take 1 tablet (15 mg total) by mouth daily. 90 tablet 3   aspirin EC 81 MG tablet Take 1 tablet (81 mg total) daily by mouth.     buPROPion (WELLBUTRIN XL) 150 MG 24 hr tablet Take 3 tablets (450 mg total) by mouth every morning. 270 tablet 3   BYSTOLIC 10 MG tablet Take 10 mg by  mouth 2 (two) times daily.     Calcium Carbonate-Vitamin D 600-400 MG-UNIT chew tablet Chew 1 tablet by mouth 2 (two) times daily.     cholestyramine (QUESTRAN) 4 g packet Take 1 packet by mouth 2 (two) times daily as needed.     dexamethasone (DECADRON) 4 MG tablet Take 20 mg (#5 tab) by mouth on day after each chemotherapy treatment 20 tablet 1   diazepam (VALIUM) 5 MG tablet Take 5 mg by mouth 2 (two) times daily as needed.      escitalopram (LEXAPRO) 20 MG tablet Take 20 mg daily by mouth.     gabapentin (NEURONTIN) 300 MG capsule Take by mouth 2 (two) times daily. Take 4 pills at night     hydrochlorothiazide (HYDRODIURIL) 25 MG tablet Take 25 mg by mouth daily.     insulin glargine (LANTUS) 100 UNIT/ML injection Inject into the skin daily as needed. Based on glucose reading at bedtime-Hold if 150 or lower     losartan (COZAAR) 100 MG tablet TAKE 1 TABLET BY MOUTH EVERY DAY 30 tablet 0   metFORMIN (GLUCOPHAGE) 500 MG tablet Take 1,000 mg by mouth 2 (two) times daily.   3   Omeprazole Magnesium (PRILOSEC OTC PO) Take 20 mg daily by mouth.     ondansetron (ZOFRAN) 8 MG tablet Take 1 tablet (8 mg total) by mouth every 8 (eight) hours as needed for nausea or vomiting. (Patient not taking: No sig reported) 30 tablet 1   oxyCODONE-acetaminophen (PERCOCET/ROXICET) 5-325 MG  tablet Take 1-2 tablets by mouth every 8 (eight) hours as needed for severe pain. 60 tablet 0   pomalidomide (POMALYST) 1 MG capsule TAKE 1 CAPSULE BY MOUTH  DAILY FOR 21 DAYS, THEN 7  DAYS OFF 21 capsule 0   primidone (MYSOLINE) 50 MG tablet Take 100 mg by mouth at bedtime. Will increase to 100 mg in 1 month     prochlorperazine (COMPAZINE) 10 MG tablet TAKE 1 TABLET (10 MG TOTAL) BY MOUTH EVERY 6 (SIX) HOURS AS NEEDED FOR NAUSEA. (Patient not taking: No sig reported) 60 tablet 1   testosterone cypionate (DEPOTESTOTERONE CYPIONATE) 100 MG/ML injection Inject 200 mg into the muscle every 14 (fourteen) days. For IM use only     No  current facility-administered medications for this visit.    Medication Side Effects: None  Allergies: No Known Allergies  Past Medical History:  Diagnosis Date   Anxiety    Depression    GERD (gastroesophageal reflux disease)    Hypertension    Sleep apnea    uses C-Pap    Past Medical History, Surgical history, Social history, and Family history were reviewed and updated as appropriate.   Please see review of systems for further details on the patient's review from today.   Objective:   Physical Exam:  There were no vitals taken for this visit.  Physical Exam Constitutional:      General: He is not in acute distress. Musculoskeletal:        General: No deformity.  Neurological:     Mental Status: He is alert and oriented to person, place, and time.     Coordination: Coordination normal.  Psychiatric:        Attention and Perception: Attention and perception normal. He does not perceive auditory or visual hallucinations.        Mood and Affect: Mood normal. Mood is not anxious or depressed. Affect is not labile, blunt, angry or inappropriate.        Speech: Speech normal.        Behavior: Behavior normal.        Thought Content: Thought content normal. Thought content is not paranoid or delusional. Thought content does not include homicidal or suicidal ideation. Thought content does not include homicidal or suicidal plan.        Cognition and Memory: Cognition and memory normal.        Judgment: Judgment normal.     Comments: Insight intact    Lab Review:     Component Value Date/Time   NA 137 06/07/2021 0859   NA 136 11/29/2017 0935   K 4.2 06/07/2021 0859   K 3.5 11/29/2017 0935   CL 100 06/07/2021 0859   CO2 25 06/07/2021 0859   CO2 22 11/29/2017 0935   GLUCOSE 154 (H) 06/07/2021 0859   GLUCOSE 232 (H) 11/29/2017 0935   BUN 13 06/07/2021 0859   BUN 11.3 11/29/2017 0935   CREATININE 1.22 06/07/2021 0859   CREATININE 0.9 11/29/2017 0935   CALCIUM 9.0  06/07/2021 0859   CALCIUM 8.5 11/29/2017 0935   PROT 6.5 06/07/2021 0859   PROT 8.2 11/15/2017 0810   PROT 8.5 (H) 11/15/2017 0810   ALBUMIN 4.3 06/07/2021 0859   ALBUMIN 3.3 (L) 11/15/2017 0810   AST 20 06/07/2021 0859   AST 20 11/15/2017 0810   ALT 32 06/07/2021 0859   ALT 39 11/15/2017 0810   ALKPHOS 44 06/07/2021 0859   ALKPHOS 100 11/15/2017 0810   BILITOT 0.3 06/07/2021  0859   BILITOT 0.45 11/15/2017 0810   GFRNONAA >60 06/07/2021 0859   GFRAA >60 08/10/2020 0941       Component Value Date/Time   WBC 4.2 06/07/2021 0859   WBC 8.9 01/21/2018 0820   RBC 4.07 (L) 06/07/2021 0859   HGB 13.2 06/07/2021 0859   HGB 14.8 11/29/2017 0935   HCT 38.0 (L) 06/07/2021 0859   HCT 43.4 11/29/2017 0935   PLT 211 06/07/2021 0859   PLT 265 11/29/2017 0935   MCV 93.4 06/07/2021 0859   MCV 91.4 11/29/2017 0935   MCH 32.4 06/07/2021 0859   MCHC 34.7 06/07/2021 0859   RDW 14.6 06/07/2021 0859   RDW 14.7 (H) 11/29/2017 0935   LYMPHSABS 0.7 06/07/2021 0859   LYMPHSABS 0.8 (L) 11/29/2017 0935   MONOABS 0.8 06/07/2021 0859   MONOABS 0.3 11/29/2017 0935   EOSABS 0.2 06/07/2021 0859   EOSABS 0.1 11/29/2017 0935   BASOSABS 0.1 06/07/2021 0859   BASOSABS 0.0 11/29/2017 0935    No results found for: POCLITH, LITHIUM   No results found for: PHENYTOIN, PHENOBARB, VALPROATE, CBMZ   .res Assessment: Plan:    Plan:  Increase Abilify 10mg  to 15mg  tab daily for mood symptoms Lexapro 20mg  daily Wellbutrin XL 450mg  every morning.  RTC 4 weeks  Patient advised to contact office with any questions, adverse effects, or acute worsening in signs and symptoms.  Discussed potential metabolic side effects associated with atypical antipsychotics, as well as potential risk for movement side effects. Advised pt to contact office if movement side effects occur.  Diagnoses and all orders for this visit:  Moderate episode of recurrent major depressive disorder (HCC) -     buPROPion (WELLBUTRIN XL)  150 MG 24 hr tablet; Take 3 tablets (450 mg total) by mouth every morning. -     ARIPiprazole (ABILIFY) 15 MG tablet; Take 1 tablet (15 mg total) by mouth daily.  GAD (generalized anxiety disorder) -     buPROPion (WELLBUTRIN XL) 150 MG 24 hr tablet; Take 3 tablets (450 mg total) by mouth every morning. -     ARIPiprazole (ABILIFY) 15 MG tablet; Take 1 tablet (15 mg total) by mouth daily.  Insomnia, unspecified type    Please see After Visit Summary for patient specific instructions.  Future Appointments  Date Time Provider [place]  07/05/2021  8:15 AM [facility] PHLEBOTOMIST [facility] None  07/05/2021  8:50 AM Ladell Pier, MD [facility] None  07/05/2021  9:30 AM [facility] CHAIR 7 [facility] None    No orders of the defined types were placed in this encounter.   -------------------------------

## 2021-06-30 ENCOUNTER — Other Ambulatory Visit: Payer: Self-pay | Admitting: Oncology

## 2021-07-03 ENCOUNTER — Encounter: Payer: Self-pay | Admitting: Oncology

## 2021-07-05 ENCOUNTER — Inpatient Hospital Stay: Payer: Medicare PPO | Attending: Nurse Practitioner

## 2021-07-05 ENCOUNTER — Other Ambulatory Visit: Payer: Self-pay

## 2021-07-05 ENCOUNTER — Inpatient Hospital Stay (HOSPITAL_BASED_OUTPATIENT_CLINIC_OR_DEPARTMENT_OTHER): Payer: Medicare PPO | Admitting: Oncology

## 2021-07-05 ENCOUNTER — Inpatient Hospital Stay: Payer: Medicare PPO

## 2021-07-05 VITALS — BP 121/76 | HR 71 | Temp 98.1°F | Resp 20 | Ht 74.0 in | Wt 263.2 lb

## 2021-07-05 DIAGNOSIS — Z79899 Other long term (current) drug therapy: Secondary | ICD-10-CM | POA: Diagnosis not present

## 2021-07-05 DIAGNOSIS — G893 Neoplasm related pain (acute) (chronic): Secondary | ICD-10-CM | POA: Insufficient documentation

## 2021-07-05 DIAGNOSIS — I6782 Cerebral ischemia: Secondary | ICD-10-CM | POA: Insufficient documentation

## 2021-07-05 DIAGNOSIS — N289 Disorder of kidney and ureter, unspecified: Secondary | ICD-10-CM | POA: Diagnosis not present

## 2021-07-05 DIAGNOSIS — E119 Type 2 diabetes mellitus without complications: Secondary | ICD-10-CM | POA: Insufficient documentation

## 2021-07-05 DIAGNOSIS — Z5112 Encounter for antineoplastic immunotherapy: Secondary | ICD-10-CM | POA: Insufficient documentation

## 2021-07-05 DIAGNOSIS — T451X5A Adverse effect of antineoplastic and immunosuppressive drugs, initial encounter: Secondary | ICD-10-CM | POA: Diagnosis not present

## 2021-07-05 DIAGNOSIS — C9000 Multiple myeloma not having achieved remission: Secondary | ICD-10-CM

## 2021-07-05 DIAGNOSIS — I10 Essential (primary) hypertension: Secondary | ICD-10-CM | POA: Diagnosis not present

## 2021-07-05 DIAGNOSIS — G620 Drug-induced polyneuropathy: Secondary | ICD-10-CM | POA: Diagnosis not present

## 2021-07-05 DIAGNOSIS — R197 Diarrhea, unspecified: Secondary | ICD-10-CM | POA: Diagnosis not present

## 2021-07-05 LAB — CMP (CANCER CENTER ONLY)
ALT: 38 U/L (ref 0–44)
AST: 24 U/L (ref 15–41)
Albumin: 4.8 g/dL (ref 3.5–5.0)
Alkaline Phosphatase: 59 U/L (ref 38–126)
Anion gap: 13 (ref 5–15)
BUN: 31 mg/dL — ABNORMAL HIGH (ref 6–20)
CO2: 25 mmol/L (ref 22–32)
Calcium: 9.4 mg/dL (ref 8.9–10.3)
Chloride: 95 mmol/L — ABNORMAL LOW (ref 98–111)
Creatinine: 1.47 mg/dL — ABNORMAL HIGH (ref 0.61–1.24)
GFR, Estimated: 56 mL/min — ABNORMAL LOW (ref >60)
Glucose, Bld: 209 mg/dL — ABNORMAL HIGH (ref 70–99)
Potassium: 4.7 mmol/L (ref 3.5–5.1)
Sodium: 133 mmol/L — ABNORMAL LOW (ref 135–145)
Total Bilirubin: 0.3 mg/dL (ref 0.3–1.2)
Total Protein: 7 g/dL (ref 6.5–8.1)

## 2021-07-05 LAB — CBC WITH DIFFERENTIAL (CANCER CENTER ONLY)
Abs Immature Granulocytes: 0.06 10*3/uL (ref 0.00–0.07)
Basophils Absolute: 0.1 10*3/uL (ref 0.0–0.1)
Basophils Relative: 1 %
Eosinophils Absolute: 0.1 10*3/uL (ref 0.0–0.5)
Eosinophils Relative: 1 %
HCT: 42.6 % (ref 39.0–52.0)
Hemoglobin: 14.7 g/dL (ref 13.0–17.0)
Immature Granulocytes: 1 %
Lymphocytes Relative: 11 %
Lymphs Abs: 1 10*3/uL (ref 0.7–4.0)
MCH: 31.8 pg (ref 26.0–34.0)
MCHC: 34.5 g/dL (ref 30.0–36.0)
MCV: 92.2 fL (ref 80.0–100.0)
Monocytes Absolute: 0.6 10*3/uL (ref 0.1–1.0)
Monocytes Relative: 7 %
Neutro Abs: 7.4 10*3/uL (ref 1.7–7.7)
Neutrophils Relative %: 79 %
Platelet Count: 286 10*3/uL (ref 150–400)
RBC: 4.62 MIL/uL (ref 4.22–5.81)
RDW: 13.8 % (ref 11.5–15.5)
WBC Count: 9.3 10*3/uL (ref 4.0–10.5)
nRBC: 0 % (ref 0.0–0.2)

## 2021-07-05 MED ORDER — OXYCODONE-ACETAMINOPHEN 5-325 MG PO TABS: 1.0000 | ORAL_TABLET | Freq: Three times a day (TID) | ORAL | 0 refills | Status: DC | PRN

## 2021-07-05 MED ORDER — ACETAMINOPHEN 325 MG PO TABS
650.0000 mg | ORAL_TABLET | Freq: Once | ORAL | Status: AC
  Administered 2021-07-05: 650 mg via ORAL
  Filled 2021-07-05: qty 2

## 2021-07-05 MED ORDER — DARATUMUMAB-HYALURONIDASE-FIHJ 1800-30000 MG-UT/15ML ~~LOC~~ SOLN
1800.0000 mg | Freq: Once | SUBCUTANEOUS | Status: AC
  Administered 2021-07-05: 1800 mg via SUBCUTANEOUS
  Filled 2021-07-05: qty 15

## 2021-07-05 MED ORDER — DIPHENHYDRAMINE HCL 25 MG PO CAPS
50.0000 mg | ORAL_CAPSULE | Freq: Once | ORAL | Status: AC
  Administered 2021-07-05: 50 mg via ORAL
  Filled 2021-07-05: qty 2

## 2021-07-05 MED ORDER — DEXAMETHASONE 4 MG PO TABS
20.0000 mg | ORAL_TABLET | Freq: Once | ORAL | Status: AC
  Administered 2021-07-05: 20 mg via ORAL
  Filled 2021-07-05: qty 5

## 2021-07-05 NOTE — Patient Instructions (Signed)
[facility]   Discharge Instructions: Thank you for choosing [facility] to provide your oncology and hematology care.   If you have a lab appointment with the [facility], please go directly to the [facility] and check in at the registration area.   Wear comfortable clothing and clothing appropriate for easy access to any Portacath or PICC line.   We strive to give you quality time with your provider. You may need to reschedule your appointment if you arrive late (15 or more minutes).  Arriving late affects you and other patients whose appointments are after yours.  Also, if you miss three or more appointments without notifying the office, you may be dismissed from the clinic at the provider's discretion.      For prescription refill requests, have your pharmacy contact our office and allow 72 hours for refills to be completed.    Today you received the following chemotherapy and/or immunotherapy agents Daratumumab-hyaluronisae-fihj (DARZALEX FASPRO).      To help prevent nausea and vomiting after your treatment, we encourage you to take your nausea medication as directed.  BELOW ARE SYMPTOMS THAT SHOULD BE REPORTED IMMEDIATELY: *FEVER GREATER THAN 100.4 F (38 C) OR HIGHER *CHILLS OR SWEATING *NAUSEA AND VOMITING THAT IS NOT CONTROLLED WITH YOUR NAUSEA MEDICATION *UNUSUAL SHORTNESS OF BREATH *UNUSUAL BRUISING OR BLEEDING *URINARY PROBLEMS (pain or burning when urinating, or frequent urination) *BOWEL PROBLEMS (unusual diarrhea, constipation, pain near the anus) TENDERNESS IN MOUTH AND THROAT WITH OR WITHOUT PRESENCE OF ULCERS (sore throat, sores in mouth, or a toothache) UNUSUAL RASH, SWELLING OR PAIN  UNUSUAL VAGINAL DISCHARGE OR ITCHING   Items with * indicate a potential emergency and should be followed up as soon as possible or go to the Emergency Department if any problems should occur.  Please show the CHEMOTHERAPY ALERT CARD or  IMMUNOTHERAPY ALERT CARD at check-in to the Emergency Department and triage nurse.  Should you have questions after your visit or need to cancel or reschedule your appointment, please contact [facility]  Dept: [phone]  and follow the prompts.  Office hours are 8:00 a.m. to 4:30 p.m. Monday - Friday. Please note that voicemails left after 4:00 p.m. may not be returned until the following business day.  We are closed weekends and major holidays. You have access to a nurse at all times for urgent questions. Please call the main number to the clinic Dept: [phone] and follow the prompts.   For any non-urgent questions, you may also contact your provider using MyChart. We now offer e-Visits for anyone 30 and older to request care online for non-urgent symptoms. For details visit [email or web address].   Also download the MyChart app! Go to the app store, search "MyChart", open the app, select [facility], and log in with your MyChart username and password.  Due to Covid, a mask is required upon entering the hospital/clinic. If you do not have a mask, one will be given to you upon arrival. For doctor visits, patients may have 1 support person aged 58 or older with them. For treatment visits, patients cannot have anyone with them due to current Covid guidelines and our immunocompromised population.   Daratumumab; Hyaluronidase Injection What is this medication? DARATUMUMAB; HYALURONIDASE (dar a toom ue mab / hye al ur ON i dase) is a monoclonal antibody. Hyaluronidase is used to improve the effects of daratumumab. It treats certain types of cancer. Some of the cancers treated aremultiple  myeloma and light-chain amyloidosis. This medicine may be used for other purposes; ask your health care provider orpharmacist if you have questions. COMMON BRAND NAME(S): DARZALEX FASPRO What should I tell my care team before I take this medication? They need to know if you have any  of these conditions: heart disease infection especially a viral infection such as chickenpox, cold sores, herpes, or hepatitis B lung or breathing disease an unusual or allergic reaction to daratumumab, hyaluronidase, other medicines, foods, dyes, or preservatives pregnant or trying to get pregnant breast-feeding How should I use this medication? This medicine is for injection under the skin. It is given by a health careprofessional in a hospital or clinic setting. Talk to your pediatrician regarding the use of this medicine in children.Special care may be needed. Overdosage: If you think you have taken too much of this medicine contact apoison control center or emergency room at once. NOTE: This medicine is only for you. Do not share this medicine with others. What if I miss a dose? Keep appointments for follow-up doses as directed. It is important not to miss your dose. Call your doctor or health care professional if you are unable tokeep an appointment. What may interact with this medication? Interactions have not been studied. This list may not describe all possible interactions. Give your health care provider a list of all the medicines, herbs, non-prescription drugs, or dietary supplements you use. Also tell them if you smoke, drink alcohol, or use illegaldrugs. Some items may interact with your medicine. What should I watch for while using this medication? Your condition will be monitored carefully while you are receiving thismedicine. This medicine can cause serious allergic reactions. To reduce your risk, your health care provider may give you other medicine to take before receiving thisone. Be sure to follow the directions from your health care provider. This medicine can affect the results of blood tests to match your blood type. These changes can last for up to 6 months after the final dose. Your healthcare provider will do blood tests to match your blood type before you start  treatment. Tell all of your healthcare providers that you are being treatedwith this medicine before receiving a blood transfusion. This medicine can affect the results of some tests used to determine treatmentresponse; extra tests may be needed to evaluate response. Do not become pregnant while taking this medicine or for 3 months after stopping it. Women should inform their health care provider if they wish to become pregnant or think they might be pregnant. There is a potential for serious side effects to an unborn child. Talk to your health care provider formore information. Do not breast-feed an infant while taking this medicine. What side effects may I notice from receiving this medication? Side effects that you should report to your doctor or health care professionalas soon as possible: allergic reactions like skin rash, itching or hives, swelling of the face, lips, or tongue blurred vision breathing problems chest pain cough dizziness fast heartbeat feeling faint or lightheaded headache low blood counts - this medicine may decrease the number of white blood cells, red blood cells and platelets. You may be at increased risk for infections and bleeding. nausea, vomiting shortness of breath signs of decreased platelets or bleeding - bruising, pinpoint red spots on the skin, black, tarry stools, blood in the urine signs of decreased red blood cells - unusually weak or tired, feeling faint or lightheaded, falls signs of infection - fever or chills, cough, sore throat,  pain or difficulty passing urine Side effects that usually do not require medical attention (report these toyour doctor or health care professional if they continue or are bothersome): back pain constipation diarrhea pain, tingling, numbness in the hands or feet muscle cramps swelling of the ankles, feet, hands tiredness trouble sleeping This list may not describe all possible side effects. Call your doctor for medical  advice about side effects. You may report side effects to FDA at1-800-FDA-1088. Where should I keep my medication? This drug is given in a hospital or clinic and will not be stored at home. NOTE: This sheet is a summary. It may not cover all possible information. If you have questions about this medicine, talk to your doctor, pharmacist, orhealth care provider.  2022 Elsevier/Gold Standard (2020-12-29 12:51:54)

## 2021-07-05 NOTE — Progress Notes (Signed)
[facility] OFFICE PROGRESS NOTE   Diagnosis: Multiple myeloma  INTERVAL HISTORY:   Steve Carter returns as scheduled.  He completed another treatment with daratumumab on 06/07/2021.  No new complaint.  He continues to have neuropathy in the hands and feet.  He is taking multiple medications for the neuropathy.  He takes oxycodone at night.  He is scheduled to see neurology later this month.  He remains off of pomalidomide.  No bone pain.  He reports intentional weight loss secondary to a change in his diet.  Objective:  Vital signs in last 24 hours:  Blood pressure 121/76, pulse 71, temperature 98.1 F (36.7 C), temperature source Oral, resp. rate 20, height _0  (1.88 m), weight 263 lb 3.2 oz (119.4 kg), SpO2 98 %.    HEENT: No thrush Resp: Lungs clear bilaterally Cardio: Regular rate and rhythm GI: No hepatosplenomegaly Vascular: No leg edema   Lab Results:  Lab Results  Component Value Date   WBC 9.3 07/05/2021   HGB 14.7 07/05/2021   HCT 42.6 07/05/2021   MCV 92.2 07/05/2021   PLT 286 07/05/2021   NEUTROABS 7.4 07/05/2021    CMP  Lab Results  Component Value Date   NA 133 (L) 07/05/2021   K 4.7 07/05/2021   CL 95 (L) 07/05/2021   CO2 25 07/05/2021   GLUCOSE 209 (H) 07/05/2021   BUN 31 (H) 07/05/2021   CREATININE 1.47 (H) 07/05/2021   CALCIUM 9.4 07/05/2021   PROT 7.0 07/05/2021   ALBUMIN 4.8 07/05/2021   AST 24 07/05/2021   ALT 38 07/05/2021   ALKPHOS 59 07/05/2021   BILITOT 0.3 07/05/2021   GFRNONAA 56 (L) 07/05/2021   GFRAA >60 08/10/2020     Medications: I have reviewed the patient's current medications.   Assessment/Plan:   Multiple myeloma- IgG lambda serum monoclonal protein, elevated serum lambda light chains Bone survey 10/11/2017-lytic lesions noted in the skull, right iliac, left second rib, and mottled appearance of the proximal femurs/pelvis Bone marrow biopsy 10/14/2017-hypercellular marrow with plasma cell neoplasm,  82% plasma cells, lambda light chain restricted, hyperdiploid with gains of chromosomes 3, 5, 7, 9, and 11.  FISH panel positive for gain of ATM (+11) Cycle 1 RVD 10/18/2017 (Revlimid started 10/23/2017) Cycle 2 RVD 11/15/2017 Cycle 3 RVD 12/13/2017 Cycle 4 RVD 01/14/2018 Cycle 5 of RVD 02/14/2018 (Revlimid given for 7 days and 1 week of Velcade), therapy held beginning 02/21/2018 secondary to plan for stem cell therapy Bone marrow biopsy 02/20/2018, 1-2% plasma cell PET scan 02/20/2018, no malignant range activity above background, numerous lytic lesions throughout the axial and appendicular skeleton,, left iliac wing fracture Melphalan, 200 mg/m on 03/24/2018 Stem cell infusion 03/25/2018 Restaging at [facility] 07/09/2018: Normal lambda light chains, no serum M spike, bone marrow biopsy negative for myeloma, less than 1% plasma cells PET scan [facility] 07/09/2018- lytic bone lesions, no malignant range activity Initiation of maintenance Revlimid, 10 mg, 21/28 days 08/15/2018 Cycle 2 maintenance Revlimid 09/12/2018 Cycle 3 maintenance Revlimid 10/10/2018 Revlimid placed on hold 06/15/2019 due to presyncopal/syncopal episodes and diarrhea Revlimid resumed 5 mg 21 days on/7 days off following office visit 06/29/2019 Revlimid placed on hold 07/14/2019 Revlimid resumed 08/21/2019 Bone survey 09/09/2019- no acute findings or clear explanation for right buttock pain.  Lucent lesions in the right pelvis are stable without pathologic fracture.  Evidence of healing lytic lesions in the right scapula, cervical spine spinous processes and left L4 transverse process.  Possible mild progression  of lytic lesions within the L1 and L4 vertebral bodies.  Stable lytic lesions in the calvarium. PET scan 09/25/2019-new FDG avid bone lesions in the right humeral neck and sacrum similar remaining lytic lesions with FDG activity below background Bone marrow biopsy at [facility] on 10/01/2019-5% plasma cells on the bone marrow  biopsy suboptimal sample  Cycle 1 daratumumab, pomalidomide, Decadron 10/19/2019 Zometa 10/19/2019 Cycle 2 daratumumab, pomalidomide, Decadron 11/16/2019 (he will begin the pomalidomide 11/17/2019) Cycle 3 daratumumab, pomalidomide, and Decadron 12/14/2019 Cycle 4 daratumumab, pomalidomide, and Decadron 01/11/2020 Cycle 5 daratumumab, pomalidomide, Decadron 02/03/2020 (he began pomalidomide on 02/06/2020) Cycle 6 daratumumab, pomalidomide, Decadron 03/02/2020 (pomalidomide starting 03/12/2020) Cycle 7 daratumumab, pomalidomide, Decadron 03/29/2020 (pomalidomide scheduled to start 04/15/2020) Cycle 8 daratumumab, pomalidomide, Decadron 05/04/2020 (pomalidomide started 05/14/2020) Cycle 9 daratumumab-monthly 06/15/2020, pomalidomide held Cycle 10 daratumumab 07/13/2020, pomalidomide 2 mg 21 days beginning 07/15/2020 Cycle 11 daratumumab 08/10/2020, pomalidomide 2 mg 21 days beginning 08/12/2020 Bone marrow biopsy 09/06/2020-hypocellular bone marrow with a relative erythroid hyperplasia and no increase in plasma cells; MRD 0.0050% Cycle 12 daratumumab 09/07/2020, pomalidomide 2 mg 21 days beginning 09/09/2020 Cycle 13 daratumumab 10/11/2020, pomalidomide 2 mg 21 days beginning 10/10/2020 Cycle 14 daratumumab 11/08/2020, pomalidomide reduced to 1 mg 21 days Cycle 15 daratumumab 12/14/2020, Pomalidomide 1 mg 21 days beginning 12/13/2020 Cycle 16 daratumumab 01/12/2021, pomalidomide 1 mg 21 days beginning 01/11/2021 Cycle 17 daratumumab 02/10/2021, pomalidomide 1 mg 21 days beginning 02/09/2021 Cycle 18 daratumumab 03/10/2021, pomalidomide 1 mg 21 days beginning 03/11/2021 Cycle 19 daratumumab 04/07/2021, Pomalidomide 1 mg 21 days beginning 04/08/2021 Cycle 20 daratumumab 05/09/2021, Pomalidomide 1 mg 21 days beginning 05/12/2021 Cycle 21 daratumumab 06/07/2021, pomalidomide held secondary to progressive neuropathy and diarrhea Cycle 22 daratumumab 07/05/2021, pomalidomide held secondary to neuropathy     2.  Pain secondary to multiple  myeloma involving the spine and pelvis-resolved MRI of the lumbar spine 10/02/2018- numerous rounded foci in the vertebral bodies, hypertrophy of the L4 transverse process   3.  Hypertension-losartan dose increased 11/15/2017   4.  Depression-improved with Wellbutrin beginning September 2020   5.  Altered mental status-improved depression related? 6. Diabetes 7.  Recurrent episodes of fall/syncope-etiology unclear-evaluated by neurology at [facility], MRI brain 01/25/2020 with abnormal signal at the left superior frontal gyrus-potentially representing a low-grade glioma, scattered foci of increased T2 white matter signal-likely related to chronic small vessel disease Stereotactic brain biopsy at [facility] 03/14/2020-gliosis, no evidence of malignancy   8.  Peripheral neuropathy-gabapentin started 07/07/2020; reports increase in symptoms 06/07/2021-pomalidomide placed on hold   9.  Renal insufficiency    Disposition: Steve Carter appears stable.  There is no clinical or laboratory evidence for progression of myeloma.  He will continue monthly daratumumab.  Pomalidomide remains on hold secondary to progressive neuropathy.  He is scheduled to see neurology later this month.  I refilled his prescription for oxycodone.  Steve Carter will complete another treatment with daratumumab today.  He will return for an office visit and daratumumab in 1 month.  Betsy Coder, MD  07/05/2021  9:40 AM

## 2021-07-06 LAB — IGG: IgG (Immunoglobin G), Serum: 336 mg/dL — ABNORMAL LOW (ref 603–1613)

## 2021-07-06 LAB — KAPPA/LAMBDA LIGHT CHAINS
Kappa free light chain: 5.3 mg/L (ref 3.3–19.4)
Kappa, lambda light chain ratio: 0.4 (ref 0.26–1.65)
Lambda free light chains: 13.4 mg/L (ref 5.7–26.3)

## 2021-07-07 LAB — PROTEIN ELECTROPHORESIS, SERUM
A/G Ratio: 1.8 — ABNORMAL HIGH (ref 0.7–1.7)
Albumin ELP: 4.3 g/dL (ref 2.9–4.4)
Alpha-1-Globulin: 0.2 g/dL (ref 0.0–0.4)
Alpha-2-Globulin: 1 g/dL (ref 0.4–1.0)
Beta Globulin: 0.9 g/dL (ref 0.7–1.3)
Gamma Globulin: 0.3 g/dL — ABNORMAL LOW (ref 0.4–1.8)
Globulin, Total: 2.4 g/dL (ref 2.2–3.9)
M-Spike, %: 0.1 g/dL — ABNORMAL HIGH
Total Protein ELP: 6.7 g/dL (ref 6.0–8.5)

## 2021-07-25 DIAGNOSIS — N529 Male erectile dysfunction, unspecified: Secondary | ICD-10-CM | POA: Insufficient documentation

## 2021-07-30 ENCOUNTER — Other Ambulatory Visit: Payer: Self-pay | Admitting: Oncology

## 2021-08-02 ENCOUNTER — Encounter: Payer: Self-pay | Admitting: Nurse Practitioner

## 2021-08-02 ENCOUNTER — Inpatient Hospital Stay: Payer: Medicare PPO

## 2021-08-02 ENCOUNTER — Inpatient Hospital Stay: Payer: Medicare PPO | Admitting: Nurse Practitioner

## 2021-08-02 ENCOUNTER — Other Ambulatory Visit: Payer: Self-pay

## 2021-08-02 DIAGNOSIS — Z5112 Encounter for antineoplastic immunotherapy: Secondary | ICD-10-CM | POA: Diagnosis not present

## 2021-08-02 DIAGNOSIS — C9000 Multiple myeloma not having achieved remission: Secondary | ICD-10-CM

## 2021-08-02 LAB — CMP (CANCER CENTER ONLY)
ALT: 31 U/L (ref 0–44)
AST: 23 U/L (ref 15–41)
Albumin: 4.3 g/dL (ref 3.5–5.0)
Alkaline Phosphatase: 44 U/L (ref 38–126)
Anion gap: 10 (ref 5–15)
BUN: 17 mg/dL (ref 6–20)
CO2: 26 mmol/L (ref 22–32)
Calcium: 9.1 mg/dL (ref 8.9–10.3)
Chloride: 99 mmol/L (ref 98–111)
Creatinine: 1.38 mg/dL — ABNORMAL HIGH (ref 0.61–1.24)
GFR, Estimated: >60 mL/min (ref >60)
Glucose, Bld: 232 mg/dL — ABNORMAL HIGH (ref 70–99)
Potassium: 4.6 mmol/L (ref 3.5–5.1)
Sodium: 135 mmol/L (ref 135–145)
Total Bilirubin: 0.3 mg/dL (ref 0.3–1.2)
Total Protein: 6.5 g/dL (ref 6.5–8.1)

## 2021-08-02 LAB — CBC WITH DIFFERENTIAL (CANCER CENTER ONLY)
Abs Immature Granulocytes: 0.05 10*3/uL (ref 0.00–0.07)
Basophils Absolute: 0 10*3/uL (ref 0.0–0.1)
Basophils Relative: 0 %
Eosinophils Absolute: 0.1 10*3/uL (ref 0.0–0.5)
Eosinophils Relative: 1 %
HCT: 40 % (ref 39.0–52.0)
Hemoglobin: 13.4 g/dL (ref 13.0–17.0)
Immature Granulocytes: 1 %
Lymphocytes Relative: 11 %
Lymphs Abs: 0.9 10*3/uL (ref 0.7–4.0)
MCH: 31.8 pg (ref 26.0–34.0)
MCHC: 33.5 g/dL (ref 30.0–36.0)
MCV: 94.8 fL (ref 80.0–100.0)
Monocytes Absolute: 0.5 10*3/uL (ref 0.1–1.0)
Monocytes Relative: 7 %
Neutro Abs: 6.3 10*3/uL (ref 1.7–7.7)
Neutrophils Relative %: 80 %
Platelet Count: 199 10*3/uL (ref 150–400)
RBC: 4.22 MIL/uL (ref 4.22–5.81)
RDW: 14.6 % (ref 11.5–15.5)
WBC Count: 7.8 10*3/uL (ref 4.0–10.5)
nRBC: 0 % (ref 0.0–0.2)

## 2021-08-02 MED ORDER — ZOLEDRONIC ACID 4 MG/100ML IV SOLN
4.0000 mg | Freq: Once | INTRAVENOUS | Status: AC
  Administered 2021-08-02: 4 mg via INTRAVENOUS
  Filled 2021-08-02: qty 100

## 2021-08-02 MED ORDER — SODIUM CHLORIDE 0.9 % IV SOLN: Freq: Once | INTRAVENOUS | Status: AC

## 2021-08-02 MED ORDER — DIPHENHYDRAMINE HCL 25 MG PO CAPS
50.0000 mg | ORAL_CAPSULE | Freq: Once | ORAL | Status: AC
  Administered 2021-08-02: 50 mg via ORAL
  Filled 2021-08-02: qty 2

## 2021-08-02 MED ORDER — DEXAMETHASONE 4 MG PO TABS
20.0000 mg | ORAL_TABLET | Freq: Once | ORAL | Status: AC
  Administered 2021-08-02: 20 mg via ORAL
  Filled 2021-08-02: qty 5

## 2021-08-02 MED ORDER — DARATUMUMAB-HYALURONIDASE-FIHJ 1800-30000 MG-UT/15ML ~~LOC~~ SOLN
1800.0000 mg | Freq: Once | SUBCUTANEOUS | Status: AC
  Administered 2021-08-02: 1800 mg via SUBCUTANEOUS
  Filled 2021-08-02: qty 15

## 2021-08-02 MED ORDER — OXYCODONE-ACETAMINOPHEN 5-325 MG PO TABS: 1.0000 | ORAL_TABLET | Freq: Three times a day (TID) | ORAL | 0 refills | Status: DC | PRN

## 2021-08-02 MED ORDER — ACETAMINOPHEN 325 MG PO TABS
650.0000 mg | ORAL_TABLET | Freq: Once | ORAL | Status: AC
  Administered 2021-08-02: 650 mg via ORAL
  Filled 2021-08-02: qty 2

## 2021-08-02 NOTE — Patient Instructions (Signed)
[facility]  Discharge Instructions: Thank you for choosing [facility] to provide your oncology and hematology care.   If you have a lab appointment with the [facility], please go directly to the [facility] and check in at the registration area.   Wear comfortable clothing and clothing appropriate for easy access to any Portacath or PICC line.   We strive to give you quality time with your provider. You may need to reschedule your appointment if you arrive late (15 or more minutes).  Arriving late affects you and other patients whose appointments are after yours.  Also, if you miss three or more appointments without notifying the office, you may be dismissed from the clinic at the provider's discretion.      For prescription refill requests, have your pharmacy contact our office and allow 72 hours for refills to be completed.    Today you received the following chemotherapy and/or immunotherapy agents darzalex faspro, Zometa   To help prevent nausea and vomiting after your treatment, we encourage you to take your nausea medication as directed.  BELOW ARE SYMPTOMS THAT SHOULD BE REPORTED IMMEDIATELY: *FEVER GREATER THAN 100.4 F (38 C) OR HIGHER *CHILLS OR SWEATING *NAUSEA AND VOMITING THAT IS NOT CONTROLLED WITH YOUR NAUSEA MEDICATION *UNUSUAL SHORTNESS OF BREATH *UNUSUAL BRUISING OR BLEEDING *URINARY PROBLEMS (pain or burning when urinating, or frequent urination) *BOWEL PROBLEMS (unusual diarrhea, constipation, pain near the anus) TENDERNESS IN MOUTH AND THROAT WITH OR WITHOUT PRESENCE OF ULCERS (sore throat, sores in mouth, or a toothache) UNUSUAL RASH, SWELLING OR PAIN  UNUSUAL VAGINAL DISCHARGE OR ITCHING   Items with * indicate a potential emergency and should be followed up as soon as possible or go to the Emergency Department if any problems should occur.  Please show the CHEMOTHERAPY ALERT CARD or IMMUNOTHERAPY ALERT CARD at  check-in to the Emergency Department and triage nurse.  Should you have questions after your visit or need to cancel or reschedule your appointment, please contact [facility]  Dept: [phone]  and follow the prompts.  Office hours are 8:00 a.m. to 4:30 p.m. Monday - Friday. Please note that voicemails left after 4:00 p.m. may not be returned until the following business day.  We are closed weekends and major holidays. You have access to a nurse at all times for urgent questions. Please call the main number to the clinic Dept: [phone] and follow the prompts.   For any non-urgent questions, you may also contact your provider using MyChart. We now offer e-Visits for anyone 54 and older to request care online for non-urgent symptoms. For details visit [email or web address].   Also download the MyChart app! Go to the app store, search "MyChart", open the app, select [facility], and log in with your MyChart username and password.  Due to Covid, a mask is required upon entering the hospital/clinic. If you do not have a mask, one will be given to you upon arrival. For doctor visits, patients may have 1 support person aged 54 or older with them. For treatment visits, patients cannot have anyone with them due to current Covid guidelines and our immunocompromised population.   Daratumumab; Hyaluronidase Injection What is this medication? DARATUMUMAB; HYALURONIDASE (dar a toom ue mab / hye al ur ON i dase) is a monoclonal antibody. Hyaluronidase is used to improve the effects of daratumumab. It treats certain types of cancer. Some of the cancers treated are multiple myeloma and light-chain  amyloidosis. This medicine may be used for other purposes; ask your health care provider or pharmacist if you have questions. COMMON BRAND NAME(S): DARZALEX FASPRO What should I tell my care team before I take this medication? They need to know if you have any of these conditions: heart  disease infection especially a viral infection such as chickenpox, cold sores, herpes, or hepatitis B lung or breathing disease an unusual or allergic reaction to daratumumab, hyaluronidase, other medicines, foods, dyes, or preservatives pregnant or trying to get pregnant breast-feeding How should I use this medication? This medicine is for injection under the skin. It is given by a health care professional in a hospital or clinic setting. Talk to your pediatrician regarding the use of this medicine in children. Special care may be needed. Overdosage: If you think you have taken too much of this medicine contact a poison control center or emergency room at once. NOTE: This medicine is only for you. Do not share this medicine with others. What if I miss a dose? Keep appointments for follow-up doses as directed. It is important not to miss your dose. Call your doctor or health care professional if you are unable to keep an appointment. What may interact with this medication? Interactions have not been studied. This list may not describe all possible interactions. Give your health care provider a list of all the medicines, herbs, non-prescription drugs, or dietary supplements you use. Also tell them if you smoke, drink alcohol, or use illegal drugs. Some items may interact with your medicine. What should I watch for while using this medication? Your condition will be monitored carefully while you are receiving this medicine. This medicine can cause serious allergic reactions. To reduce your risk, your health care provider may give you other medicine to take before receiving this one. Be sure to follow the directions from your health care provider. This medicine can affect the results of blood tests to match your blood type. These changes can last for up to 6 months after the final dose. Your healthcare provider will do blood tests to match your blood type before you start treatment. Tell all of your  healthcare providers that you are being treated with this medicine before receiving a blood transfusion. This medicine can affect the results of some tests used to determine treatment response; extra tests may be needed to evaluate response. Do not become pregnant while taking this medicine or for 3 months after stopping it. Women should inform their health care provider if they wish to become pregnant or think they might be pregnant. There is a potential for serious side effects to an unborn child. Talk to your health care provider for more information. Do not breast-feed an infant while taking this medicine. What side effects may I notice from receiving this medication? Side effects that you should report to your doctor or health care professional as soon as possible: allergic reactions like skin rash, itching or hives, swelling of the face, lips, or tongue blurred vision breathing problems chest pain cough dizziness fast heartbeat feeling faint or lightheaded headache low blood counts - this medicine may decrease the number of white blood cells, red blood cells and platelets. You may be at increased risk for infections and bleeding. nausea, vomiting shortness of breath signs of decreased platelets or bleeding - bruising, pinpoint red spots on the skin, black, tarry stools, blood in the urine signs of decreased red blood cells - unusually weak or tired, feeling faint or lightheaded, falls signs  of infection - fever or chills, cough, sore throat, pain or difficulty passing urine Side effects that usually do not require medical attention (report these to your doctor or health care professional if they continue or are bothersome): back pain constipation diarrhea pain, tingling, numbness in the hands or feet muscle cramps swelling of the ankles, feet, hands tiredness trouble sleeping This list may not describe all possible side effects. Call your doctor for medical advice about side  effects. You may report side effects to FDA at 1-800-FDA-1088. Where should I keep my medication? This drug is given in a hospital or clinic and will not be stored at home. NOTE: This sheet is a summary. It may not cover all possible information. If you have questions about this medicine, talk to your doctor, pharmacist, or health care provider.  2022 Elsevier/Gold Standard (2020-12-29 12:51:54)  Zoledronic Acid Injection (Hypercalcemia, Oncology) What is this medication? ZOLEDRONIC ACID (ZOE le dron ik AS id) slows calcium loss from bones. It high calcium levels in the blood from some kinds of cancer. It may be used in other people at risk for bone loss. This medicine may be used for other purposes; ask your health care provider or pharmacist if you have questions. COMMON BRAND NAME(S): Zometa What should I tell my care team before I take this medication? They need to know if you have any of these conditions: cancer dehydration dental disease kidney disease liver disease low levels of calcium in the blood lung or breathing disease (asthma) receiving steroids like dexamethasone or prednisone an unusual or allergic reaction to zoledronic acid, other medicines, foods, dyes, or preservatives pregnant or trying to get pregnant breast-feeding How should I use this medication? This drug is injected into a vein. It is given by a health care provider in a hospital or clinic setting. Talk to your health care provider about the use of this drug in children. Special care may be needed. Overdosage: If you think you have taken too much of this medicine contact a poison control center or emergency room at once. NOTE: This medicine is only for you. Do not share this medicine with others. What if I miss a dose? Keep appointments for follow-up doses. It is important not to miss your dose. Call your health care provider if you are unable to keep an appointment. What may interact with this  medication? certain antibiotics given by injection NSAIDs, medicines for pain and inflammation, like ibuprofen or naproxen some diuretics like bumetanide, furosemide teriparatide thalidomide This list may not describe all possible interactions. Give your health care provider a list of all the medicines, herbs, non-prescription drugs, or dietary supplements you use. Also tell them if you smoke, drink alcohol, or use illegal drugs. Some items may interact with your medicine. What should I watch for while using this medication? Visit your health care provider for regular checks on your progress. It may be some time before you see the benefit from this drug. Some people who take this drug have severe bone, joint, or muscle pain. This drug may also increase your risk for jaw problems or a broken thigh bone. Tell your health care provider right away if you have severe pain in your jaw, bones, joints, or muscles. Tell you health care provider if you have any pain that does not go away or that gets worse. Tell your dentist and dental surgeon that you are taking this drug. You should not have major dental surgery while on this drug. See your dentist  to have a dental exam and fix any dental problems before starting this drug. Take good care of your teeth while on this drug. Make sure you see your dentist for regular follow-up appointments. You should make sure you get enough calcium and vitamin D while you are taking this drug. Discuss the foods you eat and the vitamins you take with your health care provider. Check with your health care provider if you have severe diarrhea, nausea, and vomiting, or if you sweat a lot. The loss of too much body fluid may make it dangerous for you to take this drug. You may need blood work done while you are taking this drug. Do not become pregnant while taking this drug. Women should inform their health care provider if they wish to become pregnant or think they might be  pregnant. There is potential for serious harm to an unborn child. Talk to your health care provider for more information. What side effects may I notice from receiving this medication? Side effects that you should report to your doctor or health care provider as soon as possible: allergic reactions (skin rash, itching or hives; swelling of the face, lips, or tongue) bone pain infection (fever, chills, cough, sore throat, pain or trouble passing urine) jaw pain, especially after dental work joint pain kidney injury (trouble passing urine or change in the amount of urine) low blood pressure (dizziness; feeling faint or lightheaded, falls; unusually weak or tired) low calcium levels (fast heartbeat; muscle cramps or pain; pain, tingling, or numbness in the hands or feet; seizures) low magnesium levels (fast, irregular heartbeat; muscle cramp or pain; muscle weakness; tremors; seizures) low red blood cell counts (trouble breathing; feeling faint; lightheaded, falls; unusually weak or tired) muscle pain redness, blistering, peeling, or loosening of the skin, including inside the mouth severe diarrhea swelling of the ankles, feet, hands trouble breathing Side effects that usually do not require medical attention (report to your doctor or health care provider if they continue or are bothersome): anxious constipation coughing depressed mood eye irritation, itching, or pain fever general ill feeling or flu-like symptoms nausea pain, redness, or irritation at site where injected trouble sleeping This list may not describe all possible side effects. Call your doctor for medical advice about side effects. You may report side effects to FDA at 1-800-FDA-1088. Where should I keep my medication? This drug is given in a hospital or clinic. It will not be stored at home. NOTE: This sheet is a summary. It may not cover all possible information. If you have questions about this medicine, talk to your  doctor, pharmacist, or health care provider.  2022 Elsevier/Gold Standard (2019-09-03 09:13:00)

## 2021-08-02 NOTE — Progress Notes (Signed)
Per [name], OK to treat today w/daratumumab and zometa w/creatinine 1.38.

## 2021-08-02 NOTE — Progress Notes (Signed)
[place] OFFICE PROGRESS NOTE   Diagnosis: Multiple myeloma  INTERVAL HISTORY:   Steve Carter returns as scheduled.  He continues monthly daratumumab.  Pomalidomide on hold due to progressive neuropathy.  He reports some worsening of neuropathy in the hands and feet.  Feet painful at nighttime.  He takes Percocet if needed with fairly good relief.  He denies nausea/vomiting.  Baseline bowel habits unchanged, diarrhea.  He reports a good appetite.  Objective:  Vital signs in last 24 hours:  Blood pressure 111/75, pulse 70, temperature 98.4 F (36.9 C), temperature source Oral, resp. rate 20, height $RemoveBe'6\' 2"'BxLlSmHjl$  (1.88 m), weight 270 lb 6.4 oz (122.7 kg), SpO2 98 %.    HEENT: No thrush or ulcers. Resp: Lungs clear bilaterally. Cardio: Regular rate and rhythm. GI: Abdomen soft and nontender.  No hepatosplenomegaly. Vascular: No leg edema.    Lab Results:  Lab Results  Component Value Date   WBC 7.8 08/02/2021   HGB 13.4 08/02/2021   HCT 40.0 08/02/2021   MCV 94.8 08/02/2021   PLT 199 08/02/2021   NEUTROABS 6.3 08/02/2021    Imaging:  No results found.  Medications: I have reviewed the patient's current medications.  Assessment/Plan:  Multiple myeloma- IgG lambda serum monoclonal protein, elevated serum lambda light chains Bone survey 10/11/2017-lytic lesions noted in the skull, right iliac, left second rib, and mottled appearance of the proximal femurs/pelvis Bone marrow biopsy 10/14/2017-hypercellular marrow with plasma cell neoplasm, 82% plasma cells, lambda light chain restricted, hyperdiploid with gains of chromosomes 3, 5, 7, 9, and 11.  FISH panel positive for gain of ATM (+11) Cycle 1 RVD 10/18/2017 (Revlimid started 10/23/2017) Cycle 2 RVD 11/15/2017 Cycle 3 RVD 12/13/2017 Cycle 4 RVD 01/14/2018 Cycle 5 of RVD 02/14/2018 (Revlimid given for 7 days and 1 week of Velcade), therapy held beginning 02/21/2018 secondary to plan for stem cell therapy Bone  marrow biopsy 02/20/2018, 1-2% plasma cell PET scan 02/20/2018, no malignant range activity above background, numerous lytic lesions throughout the axial and appendicular skeleton,, left iliac wing fracture Melphalan, 200 mg/m on 03/24/2018 Stem cell infusion 03/25/2018 Restaging at [facility] 07/09/2018: Normal lambda light chains, no serum M spike, bone marrow biopsy negative for myeloma, less than 1% plasma cells PET scan at [facility] 07/09/2018- lytic bone lesions, no malignant range activity Initiation of maintenance Revlimid, 10 mg, 21/28 days 08/15/2018 Cycle 2 maintenance Revlimid 09/12/2018 Cycle 3 maintenance Revlimid 10/10/2018 Revlimid placed on hold 06/15/2019 due to presyncopal/syncopal episodes and diarrhea Revlimid resumed 5 mg 21 days on/7 days off following office visit 06/29/2019 Revlimid placed on hold 07/14/2019 Revlimid resumed 08/21/2019 Bone survey 09/09/2019- no acute findings or clear explanation for right buttock pain.  Lucent lesions in the right pelvis are stable without pathologic fracture.  Evidence of healing lytic lesions in the right scapula, cervical spine spinous processes and left L4 transverse process.  Possible mild progression of lytic lesions within the L1 and L4 vertebral bodies.  Stable lytic lesions in the calvarium. PET scan 09/25/2019-new FDG avid bone lesions in the right humeral neck and sacrum similar remaining lytic lesions with FDG activity below background Bone marrow biopsy at [facility] on 10/01/2019-5% plasma cells on the bone marrow biopsy suboptimal sample  Cycle 1 daratumumab, pomalidomide, Decadron 10/19/2019 Zometa 10/19/2019 Cycle 2 daratumumab, pomalidomide, Decadron 11/16/2019 (he will begin the pomalidomide 11/17/2019) Cycle 3 daratumumab, pomalidomide, and Decadron 12/14/2019 Cycle 4 daratumumab, pomalidomide, and Decadron 01/11/2020 Cycle 5 daratumumab, pomalidomide, Decadron 02/03/2020 (he began pomalidomide on  02/06/2020) Cycle 6 daratumumab,  pomalidomide, Decadron 03/02/2020 (pomalidomide starting 03/12/2020) Cycle 7 daratumumab, pomalidomide, Decadron 03/29/2020 (pomalidomide scheduled to start 04/15/2020) Cycle 8 daratumumab, pomalidomide, Decadron 05/04/2020 (pomalidomide started 05/14/2020) Cycle 9 daratumumab-monthly 06/15/2020, pomalidomide held Cycle 10 daratumumab 07/13/2020, pomalidomide 2 mg 21 days beginning 07/15/2020 Cycle 11 daratumumab 08/10/2020, pomalidomide 2 mg 21 days beginning 08/12/2020 Bone marrow biopsy 09/06/2020-hypocellular bone marrow with a relative erythroid hyperplasia and no increase in plasma cells; MRD 0.0050% Cycle 12 daratumumab 09/07/2020, pomalidomide 2 mg 21 days beginning 09/09/2020 Cycle 13 daratumumab 10/11/2020, pomalidomide 2 mg 21 days beginning 10/10/2020 Cycle 14 daratumumab 11/08/2020, pomalidomide reduced to 1 mg 21 days Cycle 15 daratumumab 12/14/2020, Pomalidomide 1 mg 21 days beginning 12/13/2020 Cycle 16 daratumumab 01/12/2021, pomalidomide 1 mg 21 days beginning 01/11/2021 Cycle 17 daratumumab 02/10/2021, pomalidomide 1 mg 21 days beginning 02/09/2021 Cycle 18 daratumumab 03/10/2021, pomalidomide 1 mg 21 days beginning 03/11/2021 Cycle 19 daratumumab 04/07/2021, Pomalidomide 1 mg 21 days beginning 04/08/2021 Cycle 20 daratumumab 05/09/2021, Pomalidomide 1 mg 21 days beginning 05/12/2021 Cycle 21 daratumumab 06/07/2021, pomalidomide held secondary to progressive neuropathy and diarrhea Cycle 22 daratumumab 07/05/2021, pomalidomide held secondary to neuropathy Cycle 23 daratumumab 08/02/2021, Pomalidomide remains on hold   2.  Pain secondary to multiple myeloma involving the spine and pelvis-resolved MRI of the lumbar spine 10/02/2018- numerous rounded foci in the vertebral bodies, hypertrophy of the L4 transverse process   3.  Hypertension-losartan dose increased 11/15/2017   4.  Depression-improved with Wellbutrin beginning September 2020   5.  Altered mental status-improved depression related? 6. Diabetes 7.   Recurrent episodes of fall/syncope-etiology unclear-evaluated by neurology at [facility], MRI brain 01/25/2020 with abnormal signal at the left superior frontal gyrus-potentially representing a low-grade glioma, scattered foci of increased T2 white matter signal-likely related to chronic small vessel disease Stereotactic brain biopsy at [facility] 03/14/2020-gliosis, no evidence of malignancy   8.  Peripheral neuropathy-gabapentin started 07/07/2020; reports increase in symptoms 06/07/2021-pomalidomide placed on hold; reports increase in symptoms 08/02/2021   9.  Renal insufficiency    Disposition: Steve Carter appears stable.  Plan to continue monthly daratumumab.  Pomalidomide remains on hold due to neuropathy which he reports is worse at today's visit.  He has an appointment with Dr. Marcell Anger at [facility] next month.  Due for Zometa today.  CBC and chemistry panel reviewed.  Labs adequate to proceed as above.  Stable mild elevation of creatinine.  Percocet refilled.  He will return for follow-up and daratumumab in 4 weeks.  He will contact the office in the interim with any problems.    [name] ANP/GNP-BC   08/02/2021  9:17 AM

## 2021-08-03 LAB — KAPPA/LAMBDA LIGHT CHAINS
Kappa free light chain: 6 mg/L (ref 3.3–19.4)
Kappa, lambda light chain ratio: 0.36 (ref 0.26–1.65)
Lambda free light chains: 16.6 mg/L (ref 5.7–26.3)

## 2021-08-03 LAB — IGG: IgG (Immunoglobin G), Serum: 303 mg/dL — ABNORMAL LOW (ref 603–1613)

## 2021-08-08 LAB — PROTEIN ELECTROPHORESIS, SERUM
A/G Ratio: 1.5 (ref 0.7–1.7)
Albumin ELP: 3.7 g/dL (ref 2.9–4.4)
Alpha-1-Globulin: 0.2 g/dL (ref 0.0–0.4)
Alpha-2-Globulin: 1 g/dL (ref 0.4–1.0)
Beta Globulin: 0.9 g/dL (ref 0.7–1.3)
Gamma Globulin: 0.4 g/dL (ref 0.4–1.8)
Globulin, Total: 2.4 g/dL (ref 2.2–3.9)
M-Spike, %: 0.1 g/dL — ABNORMAL HIGH
Total Protein ELP: 6.1 g/dL (ref 6.0–8.5)

## 2021-08-22 ENCOUNTER — Other Ambulatory Visit: Payer: Self-pay | Admitting: Nurse Practitioner

## 2021-08-22 DIAGNOSIS — C9000 Multiple myeloma not having achieved remission: Secondary | ICD-10-CM

## 2021-08-23 ENCOUNTER — Other Ambulatory Visit: Payer: Self-pay | Admitting: Nurse Practitioner

## 2021-08-23 DIAGNOSIS — C9000 Multiple myeloma not having achieved remission: Secondary | ICD-10-CM

## 2021-08-23 MED ORDER — OXYCODONE-ACETAMINOPHEN 5-325 MG PO TABS: 1.0000 | ORAL_TABLET | Freq: Three times a day (TID) | ORAL | 0 refills | Status: DC | PRN

## 2021-08-27 ENCOUNTER — Other Ambulatory Visit: Payer: Self-pay | Admitting: Oncology

## 2021-08-30 ENCOUNTER — Inpatient Hospital Stay: Payer: Medicare PPO

## 2021-08-30 ENCOUNTER — Inpatient Hospital Stay: Payer: Medicare PPO | Attending: Nurse Practitioner | Admitting: Oncology

## 2021-08-30 ENCOUNTER — Other Ambulatory Visit: Payer: Self-pay

## 2021-08-30 ENCOUNTER — Telehealth: Payer: Self-pay

## 2021-08-30 VITALS — BP 119/76 | HR 74 | Temp 98.7°F | Resp 18 | Ht 74.0 in | Wt 265.0 lb

## 2021-08-30 DIAGNOSIS — R200 Anesthesia of skin: Secondary | ICD-10-CM | POA: Diagnosis not present

## 2021-08-30 DIAGNOSIS — Z23 Encounter for immunization: Secondary | ICD-10-CM

## 2021-08-30 DIAGNOSIS — G893 Neoplasm related pain (acute) (chronic): Secondary | ICD-10-CM | POA: Insufficient documentation

## 2021-08-30 DIAGNOSIS — E119 Type 2 diabetes mellitus without complications: Secondary | ICD-10-CM | POA: Diagnosis not present

## 2021-08-30 DIAGNOSIS — Z5112 Encounter for antineoplastic immunotherapy: Secondary | ICD-10-CM | POA: Insufficient documentation

## 2021-08-30 DIAGNOSIS — I10 Essential (primary) hypertension: Secondary | ICD-10-CM | POA: Insufficient documentation

## 2021-08-30 DIAGNOSIS — N289 Disorder of kidney and ureter, unspecified: Secondary | ICD-10-CM | POA: Diagnosis not present

## 2021-08-30 DIAGNOSIS — Z79899 Other long term (current) drug therapy: Secondary | ICD-10-CM | POA: Insufficient documentation

## 2021-08-30 DIAGNOSIS — C9000 Multiple myeloma not having achieved remission: Secondary | ICD-10-CM | POA: Insufficient documentation

## 2021-08-30 DIAGNOSIS — T451X5A Adverse effect of antineoplastic and immunosuppressive drugs, initial encounter: Secondary | ICD-10-CM | POA: Insufficient documentation

## 2021-08-30 DIAGNOSIS — G620 Drug-induced polyneuropathy: Secondary | ICD-10-CM | POA: Insufficient documentation

## 2021-08-30 MED ORDER — OXYCODONE-ACETAMINOPHEN 5-325 MG PO TABS
1.0000 | ORAL_TABLET | Freq: Three times a day (TID) | ORAL | 0 refills | Status: DC | PRN
Start: 2021-08-30 — End: 2021-10-30

## 2021-08-30 MED ORDER — ACETAMINOPHEN 325 MG PO TABS
650.0000 mg | ORAL_TABLET | Freq: Once | ORAL | Status: AC
  Administered 2021-08-30: 650 mg via ORAL
  Filled 2021-08-30: qty 2

## 2021-08-30 MED ORDER — DIPHENHYDRAMINE HCL 25 MG PO CAPS
50.0000 mg | ORAL_CAPSULE | Freq: Once | ORAL | Status: AC
  Administered 2021-08-30: 50 mg via ORAL
  Filled 2021-08-30: qty 2

## 2021-08-30 MED ORDER — DEXAMETHASONE 4 MG PO TABS
20.0000 mg | ORAL_TABLET | Freq: Once | ORAL | Status: AC
  Administered 2021-08-30: 20 mg via ORAL
  Filled 2021-08-30: qty 5

## 2021-08-30 MED ORDER — INFLUENZA VAC SPLIT QUAD 0.5 ML IM SUSY
0.5000 mL | PREFILLED_SYRINGE | Freq: Once | INTRAMUSCULAR | Status: AC
  Administered 2021-08-30: 0.5 mL via INTRAMUSCULAR
  Filled 2021-08-30: qty 0.5

## 2021-08-30 MED ORDER — DARATUMUMAB-HYALURONIDASE-FIHJ 1800-30000 MG-UT/15ML ~~LOC~~ SOLN
1800.0000 mg | Freq: Once | SUBCUTANEOUS | Status: AC
  Administered 2021-08-30: 1800 mg via SUBCUTANEOUS
  Filled 2021-08-30: qty 15

## 2021-08-30 NOTE — Progress Notes (Signed)
Patient presents for treatment. RN assessment completed along with the following:  Labs/vitals reviewed - Yes, and within treatment parameters.  Labs from [facility] (care everywhere) 08/28/2021 reviewed. Weight within 10% of previous measurement - Yes. Noted that patient has gained 7 lbs since last visit Oncology Treatment Attestation completed for current therapy- Yes, on date 08/30/2021. Informed consent completed and reflects current therapy/intent - Yes, on date 11/08/2020             Provider progress note reviewed - Yes, today's provider note was reviewed. Treatment/Antibody/Supportive plan reviewed - Yes, and there are no adjustments needed for today's treatment. S&H and other orders reviewed - Yes, and there are no additional orders identified. Previous treatment date reviewed - Yes, and the appropriate amount of time has elapsed between treatments.  Patient to proceed with treatment.

## 2021-08-30 NOTE — Progress Notes (Signed)
[place] OFFICE PROGRESS NOTE   Diagnosis: Multiple myeloma  INTERVAL HISTORY:   Steve Carter returns as scheduled.  He generally feels well.  He has persistent numbness in the extremities.  He has numbness in the feet at night.  He takes oxycodone in the evening.  He continues to have balance difficulty immediately after standing.  No falls.  He saw Dr. Marcell Anger on 08/28/2021.  He is scheduled for 56-monthfollow-up visit. He continues monthly daratumumab.  No rash or shortness of breath. Objective:  Vital signs in last 24 hours:  Blood pressure 119/76, pulse 74, temperature 98.7 F (37.1 C), temperature source Oral, resp. rate 18, height 6' 2" (1.88 m), weight 265 lb (120.2 kg), SpO2 100 %.    HEENT: No thrush or ulcers Resp: Lungs clear bilaterally Cardio: Regular rate and rhythm GI: No hepatosplenomegaly, nontender Vascular: No leg edema   Lab Results  Component Value Date   WBC 7.8 08/02/2021   HGB 13.4 08/02/2021   HCT 40.0 08/02/2021   MCV 94.8 08/02/2021   PLT 199 08/02/2021   NEUTROABS 6.3 08/02/2021    CMP  Lab Results  Component Value Date   NA 135 08/02/2021   K 4.6 08/02/2021   CL 99 08/02/2021   CO2 26 08/02/2021   GLUCOSE 232 (H) 08/02/2021   BUN 17 08/02/2021   CREATININE 1.38 (H) 08/02/2021   CALCIUM 9.1 08/02/2021   PROT 6.5 08/02/2021   ALBUMIN 4.3 08/02/2021   AST 23 08/02/2021   ALT 31 08/02/2021   ALKPHOS 44 08/02/2021   BILITOT 0.3 08/02/2021   GFRNONAA >60 08/02/2021   GFRAA >60 08/10/2020     Medications: I have reviewed the patient's current medications.   Assessment/Plan: Multiple myeloma- IgG lambda serum monoclonal protein, elevated serum lambda light chains Bone survey 10/11/2017-lytic lesions noted in the skull, right iliac, left second rib, and mottled appearance of the proximal femurs/pelvis Bone marrow biopsy 10/14/2017-hypercellular marrow with plasma cell neoplasm, 82% plasma cells, lambda light chain  restricted, hyperdiploid with gains of chromosomes 3, 5, 7, 9, and 11.  FISH panel positive for gain of ATM (+11) Cycle 1 RVD 10/18/2017 (Revlimid started 10/23/2017) Cycle 2 RVD 11/15/2017 Cycle 3 RVD 12/13/2017 Cycle 4 RVD 01/14/2018 Cycle 5 of RVD 02/14/2018 (Revlimid given for 7 days and 1 week of Velcade), therapy held beginning 02/21/2018 secondary to plan for stem cell therapy Bone marrow biopsy 02/20/2018, 1-2% plasma cell PET scan 02/20/2018, no malignant range activity above background, numerous lytic lesions throughout the axial and appendicular skeleton,, left iliac wing fracture Melphalan, 200 mg/m on 03/24/2018 Stem cell infusion 03/25/[facility] Normal lambda light chains, no serum M spike, bone marrow biopsy negative for myeloma, less than 1% [facility]- lytic bone lesions, no malignant range activity Initiation of maintenance Revlimid, 10 mg, 21/28 days 08/15/2018 Cycle 2 maintenance Revlimid 09/12/2018 Cycle 3 maintenance Revlimid 10/10/2018 Revlimid placed on hold 06/15/2019 due to presyncopal/syncopal episodes and diarrhea Revlimid resumed 5 mg 21 days on/7 days off following office visit 06/29/2019 Revlimid placed on hold 07/14/2019 Revlimid resumed 08/21/2019 Bone survey 09/09/2019- no acute findings or clear explanation for right buttock pain.  Lucent lesions in the right pelvis are stable without pathologic fracture.  Evidence of healing lytic lesions in the right scapula, cervical spine spinous processes and left L4 transverse process.  Possible mild progression of lytic lesions within the L1 and L4 vertebral bodies.  Stable lytic  lesions in the calvarium. PET scan 09/25/2019-new FDG avid bone lesions in the right humeral neck and sacrum similar remaining lytic lesions with FDG activity below background Bone marrow biopsy at [facility] on 10/01/2019-5% plasma cells on the bone marrow biopsy suboptimal sample  Cycle 1  daratumumab, pomalidomide, Decadron 10/19/2019 Zometa 10/19/2019 Cycle 2 daratumumab, pomalidomide, Decadron 11/16/2019 (he will begin the pomalidomide 11/17/2019) Cycle 3 daratumumab, pomalidomide, and Decadron 12/14/2019 Cycle 4 daratumumab, pomalidomide, and Decadron 01/11/2020 Cycle 5 daratumumab, pomalidomide, Decadron 02/03/2020 (he began pomalidomide on 02/06/2020) Cycle 6 daratumumab, pomalidomide, Decadron 03/02/2020 (pomalidomide starting 03/12/2020) Cycle 7 daratumumab, pomalidomide, Decadron 03/29/2020 (pomalidomide scheduled to start 04/15/2020) Cycle 8 daratumumab, pomalidomide, Decadron 05/04/2020 (pomalidomide started 05/14/2020) Cycle 9 daratumumab-monthly 06/15/2020, pomalidomide held Cycle 10 daratumumab 07/13/2020, pomalidomide 2 mg 21 days beginning 07/15/2020 Cycle 11 daratumumab 08/10/2020, pomalidomide 2 mg 21 days beginning 08/12/2020 Bone marrow biopsy 09/06/2020-hypocellular bone marrow with a relative erythroid hyperplasia and no increase in plasma cells; MRD 0.0050% Cycle 12 daratumumab 09/07/2020, pomalidomide 2 mg 21 days beginning 09/09/2020 Cycle 13 daratumumab 10/11/2020, pomalidomide 2 mg 21 days beginning 10/10/2020 Cycle 14 daratumumab 11/08/2020, pomalidomide reduced to 1 mg 21 days Cycle 15 daratumumab 12/14/2020, Pomalidomide 1 mg 21 days beginning 12/13/2020 Cycle 16 daratumumab 01/12/2021, pomalidomide 1 mg 21 days beginning 01/11/2021 Cycle 17 daratumumab 02/10/2021, pomalidomide 1 mg 21 days beginning 02/09/2021 Cycle 18 daratumumab 03/10/2021, pomalidomide 1 mg 21 days beginning 03/11/2021 Cycle 19 daratumumab 04/07/2021, Pomalidomide 1 mg 21 days beginning 04/08/2021 Cycle 20 daratumumab 05/09/2021, Pomalidomide 1 mg 21 days beginning 05/12/2021 Cycle 21 daratumumab 06/07/2021, pomalidomide held secondary to progressive neuropathy and diarrhea Cycle 22 daratumumab 07/05/2021, pomalidomide held secondary to neuropathy Cycle 23 daratumumab 08/02/2021, Pomalidomide remains on hold Cycle 24  daratumumab 08/30/2021, pomalidomide on hold   2.  Pain secondary to multiple myeloma involving the spine and pelvis-resolved MRI of the lumbar spine 10/02/2018- numerous rounded foci in the vertebral bodies, hypertrophy of the L4 transverse process   3.  Hypertension-losartan dose increased 11/15/2017   4.  Depression-improved with Wellbutrin beginning September 2020   5.  Altered mental status-improved depression related? 6. Diabetes 7.  Recurrent episodes of fall/syncope-etiology unclear-evaluated by neurology at [facility], MRI brain 01/25/2020 with abnormal signal at the left superior frontal gyrus-potentially representing a low-grade glioma, scattered foci of increased T2 white matter signal-likely related to chronic small vessel disease Stereotactic brain biopsy at [facility] 03/14/2020-gliosis, no evidence of malignancy   8.  Peripheral neuropathy-gabapentin started 07/07/2020; reports increase in symptoms 06/07/2021-pomalidomide placed on hold; reports increase in symptoms 08/02/2021   9.  Renal insufficiency     Disposition: Steve Carter appears stable.  He is tolerating the daratumumab/Decadron well.  I reviewed labs from [facility] from 08/28/2021.  The serum free light chains were normal.  He will complete another treat with daratumumab/Decadron today.  Steve Carter will receive an influenza vaccine today.  He will be due for Evusheld in November.  He will return for an office visit and daratumumab in 1 month.  Betsy Coder, MD  08/30/2021  9:46 AM

## 2021-08-30 NOTE — Telephone Encounter (Signed)
Patient seen by Dr. Sherrill today ? ?Vitals are within treatment parameters. ? ?Labs reviewed by Dr. Sherrill and are within treatment parameters. ? ?Per physician team, patient is ready for treatment and there are NO modifications to the treatment plan.  ?

## 2021-08-30 NOTE — Patient Instructions (Addendum)
[facility]  Discharge Instructions: Thank you for choosing [facility] to provide your oncology and hematology care.   If you have a lab appointment with the [facility], please go directly to the [facility] and check in at the registration area.   Wear comfortable clothing and clothing appropriate for easy access to any Portacath or PICC line.   We strive to give you quality time with your provider. You may need to reschedule your appointment if you arrive late (15 or more minutes).  Arriving late affects you and other patients whose appointments are after yours.  Also, if you miss three or more appointments without notifying the office, you may be dismissed from the clinic at the provider's discretion.      For prescription refill requests, have your pharmacy contact our office and allow 72 hours for refills to be completed.    Today you received the following chemotherapy and/or immunotherapy agents darzalex faspro. You also received the flu vaccine.    To help prevent nausea and vomiting after your treatment, we encourage you to take your nausea medication as directed.  BELOW ARE SYMPTOMS THAT SHOULD BE REPORTED IMMEDIATELY: *FEVER GREATER THAN 100.4 F (38 C) OR HIGHER *CHILLS OR SWEATING *NAUSEA AND VOMITING THAT IS NOT CONTROLLED WITH YOUR NAUSEA MEDICATION *UNUSUAL SHORTNESS OF BREATH *UNUSUAL BRUISING OR BLEEDING *URINARY PROBLEMS (pain or burning when urinating, or frequent urination) *BOWEL PROBLEMS (unusual diarrhea, constipation, pain near the anus) TENDERNESS IN MOUTH AND THROAT WITH OR WITHOUT PRESENCE OF ULCERS (sore throat, sores in mouth, or a toothache) UNUSUAL RASH, SWELLING OR PAIN  UNUSUAL VAGINAL DISCHARGE OR ITCHING   Items with * indicate a potential emergency and should be followed up as soon as possible or go to the Emergency Department if any problems should occur.  Please show the CHEMOTHERAPY ALERT CARD or  IMMUNOTHERAPY ALERT CARD at check-in to the Emergency Department and triage nurse.  Should you have questions after your visit or need to cancel or reschedule your appointment, please contact [facility]  Dept: [phone]  and follow the prompts.  Office hours are 8:00 a.m. to 4:30 p.m. Monday - Friday. Please note that voicemails left after 4:00 p.m. may not be returned until the following business day.  We are closed weekends and major holidays. You have access to a nurse at all times for urgent questions. Please call the main number to the clinic Dept: [phone] and follow the prompts.   For any non-urgent questions, you may also contact your provider using MyChart. We now offer e-Visits for anyone 71 and older to request care online for non-urgent symptoms. For details visit [email or web address].   Also download the MyChart app! Go to the app store, search "MyChart", open the app, select [facility], and log in with your MyChart username and password.  Due to Covid, a mask is required upon entering the hospital/clinic. If you do not have a mask, one will be given to you upon arrival. For doctor visits, patients may have 1 support person aged 56 or older with them. For treatment visits, patients cannot have anyone with them due to current Covid guidelines and our immunocompromised population.   Daratumumab; Hyaluronidase Injection What is this medication? DARATUMUMAB; HYALURONIDASE (dar a toom ue mab / hye al ur ON i dase) is a monoclonal antibody. Hyaluronidase is used to improve the effects of daratumumab. It treats certain types of cancer. Some of the cancers  treated are multiple myeloma and light-chain amyloidosis. This medicine may be used for other purposes; ask your health care provider or pharmacist if you have questions. COMMON BRAND NAME(S): DARZALEX FASPRO What should I tell my care team before I take this medication? They need to know if you have any  of these conditions: heart disease infection especially a viral infection such as chickenpox, cold sores, herpes, or hepatitis B lung or breathing disease an unusual or allergic reaction to daratumumab, hyaluronidase, other medicines, foods, dyes, or preservatives pregnant or trying to get pregnant breast-feeding How should I use this medication? This medicine is for injection under the skin. It is given by a health care professional in a hospital or clinic setting. Talk to your pediatrician regarding the use of this medicine in children. Special care may be needed. Overdosage: If you think you have taken too much of this medicine contact a poison control center or emergency room at once. NOTE: This medicine is only for you. Do not share this medicine with others. What if I miss a dose? Keep appointments for follow-up doses as directed. It is important not to miss your dose. Call your doctor or health care professional if you are unable to keep an appointment. What may interact with this medication? Interactions have not been studied. This list may not describe all possible interactions. Give your health care provider a list of all the medicines, herbs, non-prescription drugs, or dietary supplements you use. Also tell them if you smoke, drink alcohol, or use illegal drugs. Some items may interact with your medicine. What should I watch for while using this medication? Your condition will be monitored carefully while you are receiving this medicine. This medicine can cause serious allergic reactions. To reduce your risk, your health care provider may give you other medicine to take before receiving this one. Be sure to follow the directions from your health care provider. This medicine can affect the results of blood tests to match your blood type. These changes can last for up to 6 months after the final dose. Your healthcare provider will do blood tests to match your blood type before you start  treatment. Tell all of your healthcare providers that you are being treated with this medicine before receiving a blood transfusion. This medicine can affect the results of some tests used to determine treatment response; extra tests may be needed to evaluate response. Do not become pregnant while taking this medicine or for 3 months after stopping it. Women should inform their health care provider if they wish to become pregnant or think they might be pregnant. There is a potential for serious side effects to an unborn child. Talk to your health care provider for more information. Do not breast-feed an infant while taking this medicine. What side effects may I notice from receiving this medication? Side effects that you should report to your doctor or health care professional as soon as possible: allergic reactions like skin rash, itching or hives, swelling of the face, lips, or tongue blurred vision breathing problems chest pain cough dizziness fast heartbeat feeling faint or lightheaded headache low blood counts - this medicine may decrease the number of white blood cells, red blood cells and platelets. You may be at increased risk for infections and bleeding. nausea, vomiting shortness of breath signs of decreased platelets or bleeding - bruising, pinpoint red spots on the skin, black, tarry stools, blood in the urine signs of decreased red blood cells - unusually weak or tired,  feeling faint or lightheaded, falls signs of infection - fever or chills, cough, sore throat, pain or difficulty passing urine Side effects that usually do not require medical attention (report these to your doctor or health care professional if they continue or are bothersome): back pain constipation diarrhea pain, tingling, numbness in the hands or feet muscle cramps swelling of the ankles, feet, hands tiredness trouble sleeping This list may not describe all possible side effects. Call your doctor for  medical advice about side effects. You may report side effects to FDA at 1-800-FDA-1088. Where should I keep my medication? This drug is given in a hospital or clinic and will not be stored at home. NOTE: This sheet is a summary. It may not cover all possible information. If you have questions about this medicine, talk to your doctor, pharmacist, or health care provider.  2022 Elsevier/Gold Standard (2020-12-29 12:51:54)   Influenza Virus Vaccine injection What is this medication? INFLUENZA VIRUS VACCINE (in floo EN zuh VAHY ruhs vak SEEN) helps to reduce the risk of getting influenza also known as the flu. The vaccine only helps protect you against some strains of the flu. This medicine may be used for other purposes; ask your health care provider or pharmacist if you have questions. COMMON BRAND NAME(S): Afluria, Afluria Quadrivalent, Agriflu, Alfuria, FLUAD, FLUAD Quadrivalent, Fluarix, Fluarix Quadrivalent, Flublok, Flublok Quadrivalent, FLUCELVAX, FLUCELVAX Quadrivalent, Flulaval, Flulaval Quadrivalent, Fluvirin, Fluzone, Fluzone High-Dose, Fluzone Intradermal, Fluzone Quadrivalent What should I tell my care team before I take this medication? They need to know if you have any of these conditions: bleeding disorder like hemophilia fever or infection Guillain-Barre syndrome or other neurological problems immune system problems infection with the human immunodeficiency virus (HIV) or AIDS low blood platelet counts multiple sclerosis an unusual or allergic reaction to influenza virus vaccine, latex, other medicines, foods, dyes, or preservatives. Different brands of vaccines contain different allergens. Some may contain latex or eggs. Talk to your doctor about your allergies to make sure that you get the right vaccine. pregnant or trying to get pregnant breast-feeding How should I use this medication? This vaccine is for injection into a muscle or under the skin. It is given by a health  care professional. A copy of Vaccine Information Statements will be given before each vaccination. Read this sheet carefully each time. The sheet may change frequently. Talk to your healthcare provider to see which vaccines are right for you. Some vaccines should not be used in all age groups. Overdosage: If you think you have taken too much of this medicine contact a poison control center or emergency room at once. NOTE: This medicine is only for you. Do not share this medicine with others. What if I miss a dose? This does not apply. What may interact with this medication? chemotherapy or radiation therapy medicines that lower your immune system like etanercept, anakinra, infliximab, and adalimumab medicines that treat or prevent blood clots like warfarin phenytoin steroid medicines like prednisone or cortisone theophylline vaccines This list may not describe all possible interactions. Give your health care provider a list of all the medicines, herbs, non-prescription drugs, or dietary supplements you use. Also tell them if you smoke, drink alcohol, or use illegal drugs. Some items may interact with your medicine. What should I watch for while using this medication? Report any side effects that do not go away within 3 days to your doctor or health care professional. Call your health care provider if any unusual symptoms occur within 6 weeks  of receiving this vaccine. You may still catch the flu, but the illness is not usually as bad. You cannot get the flu from the vaccine. The vaccine will not protect against colds or other illnesses that may cause fever. The vaccine is needed every year. What side effects may I notice from receiving this medication? Side effects that you should report to your doctor or health care professional as soon as possible: allergic reactions like skin rash, itching or hives, swelling of the face, lips, or tongue Side effects that usually do not require medical  attention (report to your doctor or health care professional if they continue or are bothersome): fever headache muscle aches and pains pain, tenderness, redness, or swelling at the injection site tiredness Side effects that you should report to your doctor or health care professional as soon as possible: allergic reactions like skin rash, itching or hives, swelling of the face, lips, or tongue Side effects that usually do not require medical attention (report to your doctor or health care professional if they continue or are bothersome): fever headache muscle aches and pains pain, tenderness, redness, or swelling at the injection site tiredness This list may not describe all possible side effects. Call your doctor for medical advice about side effects. You may report side effects to FDA at 1-800-FDA-1088. Where should I keep my medication? The vaccine will be given by a health care professional in a clinic, pharmacy, doctor's office, or other health care setting. You will not be given vaccine doses to store at home. NOTE: This sheet is a summary. It may not cover all possible information. If you have questions about this medicine, talk to your doctor, pharmacist, or health care provider.  2022 Elsevier/Gold Standard (2020-07-26 19:49:22)

## 2021-09-27 ENCOUNTER — Inpatient Hospital Stay: Payer: Medicare PPO

## 2021-09-27 ENCOUNTER — Inpatient Hospital Stay: Payer: Medicare PPO | Attending: Nurse Practitioner

## 2021-09-27 ENCOUNTER — Telehealth: Payer: Self-pay

## 2021-09-27 ENCOUNTER — Other Ambulatory Visit: Payer: Self-pay

## 2021-09-27 ENCOUNTER — Inpatient Hospital Stay: Payer: Medicare PPO | Admitting: Oncology

## 2021-09-27 VITALS — BP 146/93 | HR 97 | Temp 98.0°F | Resp 20 | Ht 74.0 in | Wt 266.0 lb

## 2021-09-27 DIAGNOSIS — G893 Neoplasm related pain (acute) (chronic): Secondary | ICD-10-CM | POA: Insufficient documentation

## 2021-09-27 DIAGNOSIS — I10 Essential (primary) hypertension: Secondary | ICD-10-CM | POA: Diagnosis not present

## 2021-09-27 DIAGNOSIS — F32A Depression, unspecified: Secondary | ICD-10-CM | POA: Diagnosis not present

## 2021-09-27 DIAGNOSIS — G629 Polyneuropathy, unspecified: Secondary | ICD-10-CM | POA: Insufficient documentation

## 2021-09-27 DIAGNOSIS — C9000 Multiple myeloma not having achieved remission: Secondary | ICD-10-CM | POA: Insufficient documentation

## 2021-09-27 DIAGNOSIS — Z5112 Encounter for antineoplastic immunotherapy: Secondary | ICD-10-CM | POA: Insufficient documentation

## 2021-09-27 DIAGNOSIS — N289 Disorder of kidney and ureter, unspecified: Secondary | ICD-10-CM | POA: Insufficient documentation

## 2021-09-27 DIAGNOSIS — Z79899 Other long term (current) drug therapy: Secondary | ICD-10-CM | POA: Insufficient documentation

## 2021-09-27 DIAGNOSIS — R42 Dizziness and giddiness: Secondary | ICD-10-CM | POA: Insufficient documentation

## 2021-09-27 DIAGNOSIS — E119 Type 2 diabetes mellitus without complications: Secondary | ICD-10-CM | POA: Insufficient documentation

## 2021-09-27 DIAGNOSIS — I6782 Cerebral ischemia: Secondary | ICD-10-CM | POA: Insufficient documentation

## 2021-09-27 LAB — CBC WITH DIFFERENTIAL (CANCER CENTER ONLY)
Abs Immature Granulocytes: 0.09 10*3/uL — ABNORMAL HIGH (ref 0.00–0.07)
Basophils Absolute: 0.1 10*3/uL (ref 0.0–0.1)
Basophils Relative: 1 %
Eosinophils Absolute: 0.1 10*3/uL (ref 0.0–0.5)
Eosinophils Relative: 1 %
HCT: 41.4 % (ref 39.0–52.0)
Hemoglobin: 14 g/dL (ref 13.0–17.0)
Immature Granulocytes: 1 %
Lymphocytes Relative: 12 %
Lymphs Abs: 0.9 10*3/uL (ref 0.7–4.0)
MCH: 31.6 pg (ref 26.0–34.0)
MCHC: 33.8 g/dL (ref 30.0–36.0)
MCV: 93.5 fL (ref 80.0–100.0)
Monocytes Absolute: 0.6 10*3/uL (ref 0.1–1.0)
Monocytes Relative: 7 %
Neutro Abs: 6.3 10*3/uL (ref 1.7–7.7)
Neutrophils Relative %: 78 %
Platelet Count: 239 10*3/uL (ref 150–400)
RBC: 4.43 MIL/uL (ref 4.22–5.81)
RDW: 14.4 % (ref 11.5–15.5)
WBC Count: 8.1 10*3/uL (ref 4.0–10.5)
nRBC: 0 % (ref 0.0–0.2)

## 2021-09-27 LAB — CMP (CANCER CENTER ONLY)
ALT: 29 U/L (ref 0–44)
AST: 21 U/L (ref 15–41)
Albumin: 4.4 g/dL (ref 3.5–5.0)
Alkaline Phosphatase: 53 U/L (ref 38–126)
Anion gap: 12 (ref 5–15)
BUN: 13 mg/dL (ref 6–20)
CO2: 27 mmol/L (ref 22–32)
Calcium: 8.8 mg/dL — ABNORMAL LOW (ref 8.9–10.3)
Chloride: 94 mmol/L — ABNORMAL LOW (ref 98–111)
Creatinine: 1.36 mg/dL — ABNORMAL HIGH (ref 0.61–1.24)
GFR, Estimated: >60 mL/min (ref >60)
Glucose, Bld: 229 mg/dL — ABNORMAL HIGH (ref 70–99)
Potassium: 4.1 mmol/L (ref 3.5–5.1)
Sodium: 133 mmol/L — ABNORMAL LOW (ref 135–145)
Total Bilirubin: 0.3 mg/dL (ref 0.3–1.2)
Total Protein: 6.6 g/dL (ref 6.5–8.1)

## 2021-09-27 MED ORDER — DIPHENHYDRAMINE HCL 25 MG PO CAPS
50.0000 mg | ORAL_CAPSULE | Freq: Once | ORAL | Status: AC
  Administered 2021-09-27: 50 mg via ORAL
  Filled 2021-09-27: qty 2

## 2021-09-27 MED ORDER — DEXAMETHASONE 4 MG PO TABS
20.0000 mg | ORAL_TABLET | Freq: Once | ORAL | Status: AC
  Administered 2021-09-27: 20 mg via ORAL
  Filled 2021-09-27: qty 5

## 2021-09-27 MED ORDER — DARATUMUMAB-HYALURONIDASE-FIHJ 1800-30000 MG-UT/15ML ~~LOC~~ SOLN
1800.0000 mg | Freq: Once | SUBCUTANEOUS | Status: AC
  Administered 2021-09-27: 1800 mg via SUBCUTANEOUS
  Filled 2021-09-27: qty 15

## 2021-09-27 MED ORDER — ACETAMINOPHEN 325 MG PO TABS
650.0000 mg | ORAL_TABLET | Freq: Once | ORAL | Status: AC
  Administered 2021-09-27: 650 mg via ORAL
  Filled 2021-09-27: qty 2

## 2021-09-27 NOTE — Progress Notes (Signed)
Patient presents for treatment. RN assessment completed along with the following:  Labs/vitals reviewed - Yes, and within treatment parameters.   Weight within 10% of previous measurement - Yes Oncology Treatment Attestation completed for current therapy- Yes, on date 10/12/19 Informed consent completed and reflects current therapy/intent - Yes, on date 11/08/20             Provider progress note reviewed - Yes, today's provider note was reviewed. Treatment/Antibody/Supportive plan reviewed - Yes, and there are no adjustments needed for today's treatment. S&H and other orders reviewed - Yes, and there are no additional orders identified. Previous treatment date reviewed - Yes, and the appropriate amount of time has elapsed between treatments.  Patient to proceed with treatment.

## 2021-09-27 NOTE — Progress Notes (Signed)
[facility] OFFICE PROGRESS NOTE   Diagnosis: Multiple myeloma  INTERVAL HISTORY:   Mr. Bartoszek returns as scheduled.  He completed another cycle of daratumumab on 08/30/2021.  He had a fall when getting out of his car approximately 1 month ago.  He felt dizzy prior to falling.  He fractured the left wrist.  He has been evaluated by orthopedics and is wearing a splint.  He continues to have pain in the feet.  No other complaint.  Objective:  Vital signs in last 24 hours:  Blood pressure (!) 146/93, pulse 97, temperature 98 F (36.7 C), temperature source Oral, resp. rate 20, height _0  (1.88 m), weight 266 lb (120.7 kg), SpO2 96 %.    HEENT: No thrush or ulcers Resp: Lungs clear bilaterally Cardio: Regular rate and rhythm GI: No hepatosplenomegaly Vascular: No leg edema   Lab Results:  Lab Results  Component Value Date   WBC 8.1 09/27/2021   HGB 14.0 09/27/2021   HCT 41.4 09/27/2021   MCV 93.5 09/27/2021   PLT 239 09/27/2021   NEUTROABS 6.3 09/27/2021    CMP  Lab Results  Component Value Date   NA 133 (L) 09/27/2021   K 4.1 09/27/2021   CL 94 (L) 09/27/2021   CO2 27 09/27/2021   GLUCOSE 229 (H) 09/27/2021   BUN 13 09/27/2021   CREATININE 1.36 (H) 09/27/2021   CALCIUM 8.8 (L) 09/27/2021   PROT 6.6 09/27/2021   ALBUMIN 4.4 09/27/2021   AST 21 09/27/2021   ALT 29 09/27/2021   ALKPHOS 53 09/27/2021   BILITOT 0.3 09/27/2021   GFRNONAA >60 09/27/2021   GFRAA >60 08/10/2020    Medications: I have reviewed the patient's current medications.   Assessment/Plan:  Multiple myeloma- IgG lambda serum monoclonal protein, elevated serum lambda light chains Bone survey 10/11/2017-lytic lesions noted in the skull, right iliac, left second rib, and mottled appearance of the proximal femurs/pelvis Bone marrow biopsy 10/14/2017-hypercellular marrow with plasma cell neoplasm, 82% plasma cells, lambda light chain restricted, hyperdiploid with gains of  chromosomes 3, 5, 7, 9, and 11.  FISH panel positive for gain of ATM (+11) Cycle 1 RVD 10/18/2017 (Revlimid started 10/23/2017) Cycle 2 RVD 11/15/2017 Cycle 3 RVD 12/13/2017 Cycle 4 RVD 01/14/2018 Cycle 5 of RVD 02/14/2018 (Revlimid given for 7 days and 1 week of Velcade), therapy held beginning 02/21/2018 secondary to plan for stem cell therapy Bone marrow biopsy 02/20/2018, 1-2% plasma cell PET scan 02/20/2018, no malignant range activity above background, numerous lytic lesions throughout the axial and appendicular skeleton,, left iliac wing fracture Melphalan, 200 mg/m on 03/24/2018 Stem cell infusion 03/25/2018 Restaging at [facility] 07/09/2018: Normal lambda light chains, no serum M spike, bone marrow biopsy negative for myeloma, less than 1% plasma cells PET scan at [facility] 07/09/2018- lytic bone lesions, no malignant range activity Initiation of maintenance Revlimid, 10 mg, 21/28 days 08/15/2018 Cycle 2 maintenance Revlimid 09/12/2018 Cycle 3 maintenance Revlimid 10/10/2018 Revlimid placed on hold 06/15/2019 due to presyncopal/syncopal episodes and diarrhea Revlimid resumed 5 mg 21 days on/7 days off following office visit 06/29/2019 Revlimid placed on hold 07/14/2019 Revlimid resumed 08/21/2019 Bone survey 09/09/2019- no acute findings or clear explanation for right buttock pain.  Lucent lesions in the right pelvis are stable without pathologic fracture.  Evidence of healing lytic lesions in the right scapula, cervical spine spinous processes and left L4 transverse process.  Possible mild progression of lytic lesions within the L1 and L4 vertebral bodies.  Stable  lytic lesions in the calvarium. PET scan 09/25/2019-new FDG avid bone lesions in the right humeral neck and sacrum similar remaining lytic lesions with FDG activity below background Bone marrow biopsy at [facility] on 10/01/2019-5% plasma cells on the bone marrow biopsy suboptimal sample  Cycle 1 daratumumab, pomalidomide, Decadron  10/19/2019 Zometa 10/19/2019 Cycle 2 daratumumab, pomalidomide, Decadron 11/16/2019 (he will begin the pomalidomide 11/17/2019) Cycle 3 daratumumab, pomalidomide, and Decadron 12/14/2019 Cycle 4 daratumumab, pomalidomide, and Decadron 01/11/2020 Cycle 5 daratumumab, pomalidomide, Decadron 02/03/2020 (he began pomalidomide on 02/06/2020) Cycle 6 daratumumab, pomalidomide, Decadron 03/02/2020 (pomalidomide starting 03/12/2020) Cycle 7 daratumumab, pomalidomide, Decadron 03/29/2020 (pomalidomide scheduled to start 04/15/2020) Cycle 8 daratumumab, pomalidomide, Decadron 05/04/2020 (pomalidomide started 05/14/2020) Cycle 9 daratumumab-monthly 06/15/2020, pomalidomide held Cycle 10 daratumumab 07/13/2020, pomalidomide 2 mg 21 days beginning 07/15/2020 Cycle 11 daratumumab 08/10/2020, pomalidomide 2 mg 21 days beginning 08/12/2020 Bone marrow biopsy 09/06/2020-hypocellular bone marrow with a relative erythroid hyperplasia and no increase in plasma cells; MRD 0.0050% Cycle 12 daratumumab 09/07/2020, pomalidomide 2 mg 21 days beginning 09/09/2020 Cycle 13 daratumumab 10/11/2020, pomalidomide 2 mg 21 days beginning 10/10/2020 Cycle 14 daratumumab 11/08/2020, pomalidomide reduced to 1 mg 21 days Cycle 15 daratumumab 12/14/2020, Pomalidomide 1 mg 21 days beginning 12/13/2020 Cycle 16 daratumumab 01/12/2021, pomalidomide 1 mg 21 days beginning 01/11/2021 Cycle 17 daratumumab 02/10/2021, pomalidomide 1 mg 21 days beginning 02/09/2021 Cycle 18 daratumumab 03/10/2021, pomalidomide 1 mg 21 days beginning 03/11/2021 Cycle 19 daratumumab 04/07/2021, Pomalidomide 1 mg 21 days beginning 04/08/2021 Cycle 20 daratumumab 05/09/2021, Pomalidomide 1 mg 21 days beginning 05/12/2021 Cycle 21 daratumumab 06/07/2021, pomalidomide held secondary to progressive neuropathy and diarrhea Cycle 22 daratumumab 07/05/2021, pomalidomide held secondary to neuropathy Cycle 23 daratumumab 08/02/2021, Pomalidomide remains on hold Cycle 24 daratumumab 08/30/2021, pomalidomide on  hold Cycle 25 daratumumab 09/27/2021, pomalidomide on hold   2.  Pain secondary to multiple myeloma involving the spine and pelvis-resolved MRI of the lumbar spine 10/02/2018- numerous rounded foci in the vertebral bodies, hypertrophy of the L4 transverse process   3.  Hypertension-losartan dose increased 11/15/2017   4.  Depression-improved with Wellbutrin beginning September 2020   5.  Altered mental status-improved depression related? 6. Diabetes 7.  Recurrent episodes of fall/syncope-etiology unclear-evaluated [facility] neurology at [facility], MRI brain 01/25/2020 with abnormal signal at the left superior frontal gyrus-potentially representing a low-grade glioma, scattered foci of increased T2 white matter signal-likely related to chronic small vessel disease Stereotactic brain biopsy at [facility] 03/14/2020-gliosis, no evidence of malignancy   8.  Peripheral neuropathy-gabapentin started 07/07/2020; reports increase in symptoms 06/07/2021-pomalidomide placed on hold; reports increase in symptoms 08/02/2021   9.  Renal insufficiency     Disposition: Mr. Glennon appears stable.  He will complete another treatment with daratumumab today.  He will return for an office visit and daratumumab in 1 month.  We will repeat a myeloma panel in 2 months.  He will be due for Zometa when he is here in 1 month.    Betsy Coder, MD  09/27/2021  9:26 AM

## 2021-09-27 NOTE — Patient Instructions (Signed)
Steve Carter  Discharge Instructions: Thank you for choosing [facility] to provide your oncology and hematology care.   If you have a lab appointment with the [facility], please go directly to the [facility] and check in at the registration area.   Wear comfortable clothing and clothing appropriate for easy access to any Portacath or PICC line.   We strive to give you quality time with your provider. You may need to reschedule your appointment if you arrive late (15 or more minutes).  Arriving late affects you and other patients whose appointments are after yours.  Also, if you miss three or more appointments without notifying the office, you may be dismissed from the clinic at the provider's discretion.      For prescription refill requests, have your pharmacy contact our office and allow 72 hours for refills to be completed.    Today you received the following chemotherapy and/or immunotherapy agents darzalex faspro. You also received the flu vaccine.    To help prevent nausea and vomiting after your treatment, we encourage you to take your nausea medication as directed.  BELOW ARE SYMPTOMS THAT SHOULD BE REPORTED IMMEDIATELY: *FEVER GREATER THAN 100.4 F (38 C) OR HIGHER *CHILLS OR SWEATING *NAUSEA AND VOMITING THAT IS NOT CONTROLLED WITH YOUR NAUSEA MEDICATION *UNUSUAL SHORTNESS OF BREATH *UNUSUAL BRUISING OR BLEEDING *URINARY PROBLEMS (pain or burning when urinating, or frequent urination) *BOWEL PROBLEMS (unusual diarrhea, constipation, pain near the anus) TENDERNESS IN MOUTH AND THROAT WITH OR WITHOUT PRESENCE OF ULCERS (sore throat, sores in mouth, or a toothache) UNUSUAL RASH, SWELLING OR PAIN  UNUSUAL VAGINAL DISCHARGE OR ITCHING   Items with * indicate a potential emergency and should be followed up as soon as possible or go to the Emergency Department if any problems should occur.  Please show the CHEMOTHERAPY ALERT CARD or  IMMUNOTHERAPY ALERT CARD at check-in to the Emergency Department and triage nurse.  Should you have questions after your visit or need to cancel or reschedule your appointment, please contact [facility]  Dept: [phone]  and follow the prompts.  Office hours are 8:00 a.m. to 4:30 p.m. Monday - Friday. Please note that voicemails left after 4:00 p.m. may not be returned until the following business day.  We are closed weekends and major holidays. You have access to a nurse at all times for urgent questions. Please call the main number to the clinic Dept: [phone] and follow the prompts.   For any non-urgent questions, you may also contact your provider using MyChart. We now offer e-Visits for anyone 50 and older to request care online for non-urgent symptoms. For details visit [email or web address].   Also download the MyChart app! Go to the app store, search "MyChart", open the app, select [facility], and log in with your MyChart username and password.  Due to Covid, a mask is required upon entering the hospital/clinic. If you do not have a mask, one will be given to you upon arrival. For doctor visits, patients may have 1 support person aged 5 or older with them. For treatment visits, patients cannot have anyone with them due to current Covid guidelines and our immunocompromised population.   Daratumumab; Hyaluronidase Injection What is this medication? DARATUMUMAB; HYALURONIDASE (dar a toom ue mab / hye al ur ON i dase) is a monoclonal antibody. Hyaluronidase is used to improve the effects of daratumumab. It treats certain types of cancer. Some of the cancers  treated are multiple myeloma and light-chain amyloidosis. This medicine may be used for other purposes; ask your health care provider or pharmacist if you have questions. COMMON BRAND NAME(S): DARZALEX FASPRO What should I tell my care team before I take this medication? They need to know if you have any  of these conditions: heart disease infection especially a viral infection such as chickenpox, cold sores, herpes, or hepatitis B lung or breathing disease an unusual or allergic reaction to daratumumab, hyaluronidase, other medicines, foods, dyes, or preservatives pregnant or trying to get pregnant breast-feeding How should I use this medication? This medicine is for injection under the skin. It is given by a health care professional in a hospital or clinic setting. Talk to your pediatrician regarding the use of this medicine in children. Special care may be needed. Overdosage: If you think you have taken too much of this medicine contact a poison control center or emergency room at once. NOTE: This medicine is only for you. Do not share this medicine with others. What if I miss a dose? Keep appointments for follow-up doses as directed. It is important not to miss your dose. Call your doctor or health care professional if you are unable to keep an appointment. What may interact with this medication? Interactions have not been studied. This list may not describe all possible interactions. Give your health care provider a list of all the medicines, herbs, non-prescription drugs, or dietary supplements you use. Also tell them if you smoke, drink alcohol, or use illegal drugs. Some items may interact with your medicine. What should I watch for while using this medication? Your condition will be monitored carefully while you are receiving this medicine. This medicine can cause serious allergic reactions. To reduce your risk, your health care provider may give you other medicine to take before receiving this one. Be sure to follow the directions from your health care provider. This medicine can affect the results of blood tests to match your blood type. These changes can last for up to 6 months after the final dose. Your healthcare provider will do blood tests to match your blood type before you start  treatment. Tell all of your healthcare providers that you are being treated with this medicine before receiving a blood transfusion. This medicine can affect the results of some tests used to determine treatment response; extra tests may be needed to evaluate response. Do not become pregnant while taking this medicine or for 3 months after stopping it. Women should inform their health care provider if they wish to become pregnant or think they might be pregnant. There is a potential for serious side effects to an unborn child. Talk to your health care provider for more information. Do not breast-feed an infant while taking this medicine. What side effects may I notice from receiving this medication? Side effects that you should report to your doctor or health care professional as soon as possible: allergic reactions like skin rash, itching or hives, swelling of the face, lips, or tongue blurred vision breathing problems chest pain cough dizziness fast heartbeat feeling faint or lightheaded headache low blood counts - this medicine may decrease the number of white blood cells, red blood cells and platelets. You may be at increased risk for infections and bleeding. nausea, vomiting shortness of breath signs of decreased platelets or bleeding - bruising, pinpoint red spots on the skin, black, tarry stools, blood in the urine signs of decreased red blood cells - unusually weak or tired,  feeling faint or lightheaded, falls signs of infection - fever or chills, cough, sore throat, pain or difficulty passing urine Side effects that usually do not require medical attention (report these to your doctor or health care professional if they continue or are bothersome): back pain constipation diarrhea pain, tingling, numbness in the hands or feet muscle cramps swelling of the ankles, feet, hands tiredness trouble sleeping This list may not describe all possible side effects. Call your doctor for  medical advice about side effects. You may report side effects to FDA at 1-800-FDA-1088. Where should I keep my medication? This drug is given in a hospital or clinic and will not be stored at home. NOTE: This sheet is a summary. It may not cover all possible information. If you have questions about this medicine, talk to your doctor, pharmacist, or health care provider.  2022 Elsevier/Gold Standard (2020-12-29 12:51:54)

## 2021-09-27 NOTE — Telephone Encounter (Signed)
Patient seen by Dr. Sherrill today ? ?Vitals are within treatment parameters. ? ?Labs reviewed by Dr. Sherrill and are within treatment parameters. ? ?Per physician team, patient is ready for treatment and there are NO modifications to the treatment plan.  ?

## 2021-10-04 ENCOUNTER — Telehealth: Payer: Self-pay | Admitting: *Deleted

## 2021-10-04 ENCOUNTER — Encounter: Payer: Self-pay | Admitting: *Deleted

## 2021-10-04 NOTE — Telephone Encounter (Signed)
Received FMLA for [employer] spouse Olivia Mackie faxed for [facility] [employer].  Provided [employer] fax number [phone]) to submit request to [facility].

## 2021-10-04 NOTE — Progress Notes (Signed)
Completed FMLA form for Mrs. Delossantos faxed to [phone]. Copy to HIM and will be provided original at next OV>

## 2021-10-22 ENCOUNTER — Other Ambulatory Visit: Payer: Self-pay | Admitting: Oncology

## 2021-10-23 ENCOUNTER — Other Ambulatory Visit: Payer: Self-pay

## 2021-10-23 ENCOUNTER — Inpatient Hospital Stay: Payer: Medicare PPO | Admitting: Oncology

## 2021-10-23 ENCOUNTER — Inpatient Hospital Stay: Payer: Medicare PPO | Attending: Nurse Practitioner

## 2021-10-23 ENCOUNTER — Inpatient Hospital Stay: Payer: Medicare PPO

## 2021-10-23 ENCOUNTER — Other Ambulatory Visit: Payer: Self-pay | Admitting: Nurse Practitioner

## 2021-10-23 VITALS — BP 131/86 | HR 76 | Temp 98.1°F | Resp 20 | Ht 74.0 in | Wt 268.0 lb

## 2021-10-23 VITALS — BP 117/80 | HR 72

## 2021-10-23 DIAGNOSIS — Z5112 Encounter for antineoplastic immunotherapy: Secondary | ICD-10-CM | POA: Insufficient documentation

## 2021-10-23 DIAGNOSIS — E1140 Type 2 diabetes mellitus with diabetic neuropathy, unspecified: Secondary | ICD-10-CM | POA: Insufficient documentation

## 2021-10-23 DIAGNOSIS — C9000 Multiple myeloma not having achieved remission: Secondary | ICD-10-CM

## 2021-10-23 DIAGNOSIS — F32A Depression, unspecified: Secondary | ICD-10-CM | POA: Insufficient documentation

## 2021-10-23 DIAGNOSIS — Z79899 Other long term (current) drug therapy: Secondary | ICD-10-CM | POA: Insufficient documentation

## 2021-10-23 DIAGNOSIS — I10 Essential (primary) hypertension: Secondary | ICD-10-CM | POA: Diagnosis not present

## 2021-10-23 DIAGNOSIS — N289 Disorder of kidney and ureter, unspecified: Secondary | ICD-10-CM | POA: Diagnosis not present

## 2021-10-23 DIAGNOSIS — Z298 Encounter for other specified prophylactic measures: Secondary | ICD-10-CM | POA: Diagnosis not present

## 2021-10-23 DIAGNOSIS — I6782 Cerebral ischemia: Secondary | ICD-10-CM | POA: Insufficient documentation

## 2021-10-23 DIAGNOSIS — G629 Polyneuropathy, unspecified: Secondary | ICD-10-CM | POA: Diagnosis not present

## 2021-10-23 DIAGNOSIS — Z9221 Personal history of antineoplastic chemotherapy: Secondary | ICD-10-CM | POA: Diagnosis not present

## 2021-10-23 DIAGNOSIS — G893 Neoplasm related pain (acute) (chronic): Secondary | ICD-10-CM | POA: Insufficient documentation

## 2021-10-23 LAB — CMP (CANCER CENTER ONLY)
ALT: 38 U/L (ref 0–44)
AST: 23 U/L (ref 15–41)
Albumin: 4.7 g/dL (ref 3.5–5.0)
Alkaline Phosphatase: 55 U/L (ref 38–126)
Anion gap: 11 (ref 5–15)
BUN: 14 mg/dL (ref 6–20)
CO2: 28 mmol/L (ref 22–32)
Calcium: 9.7 mg/dL (ref 8.9–10.3)
Chloride: 96 mmol/L — ABNORMAL LOW (ref 98–111)
Creatinine: 1.34 mg/dL — ABNORMAL HIGH (ref 0.61–1.24)
GFR, Estimated: >60 mL/min (ref >60)
Glucose, Bld: 206 mg/dL — ABNORMAL HIGH (ref 70–99)
Potassium: 4.5 mmol/L (ref 3.5–5.1)
Sodium: 135 mmol/L (ref 135–145)
Total Bilirubin: 0.4 mg/dL (ref 0.3–1.2)
Total Protein: 6.8 g/dL (ref 6.5–8.1)

## 2021-10-23 LAB — CBC WITH DIFFERENTIAL (CANCER CENTER ONLY)
Abs Immature Granulocytes: 0.09 10*3/uL — ABNORMAL HIGH (ref 0.00–0.07)
Basophils Absolute: 0.1 10*3/uL (ref 0.0–0.1)
Basophils Relative: 1 %
Eosinophils Absolute: 0.1 10*3/uL (ref 0.0–0.5)
Eosinophils Relative: 1 %
HCT: 42.2 % (ref 39.0–52.0)
Hemoglobin: 14.1 g/dL (ref 13.0–17.0)
Immature Granulocytes: 1 %
Lymphocytes Relative: 11 %
Lymphs Abs: 0.8 10*3/uL (ref 0.7–4.0)
MCH: 31.3 pg (ref 26.0–34.0)
MCHC: 33.4 g/dL (ref 30.0–36.0)
MCV: 93.6 fL (ref 80.0–100.0)
Monocytes Absolute: 0.5 10*3/uL (ref 0.1–1.0)
Monocytes Relative: 7 %
Neutro Abs: 5.8 10*3/uL (ref 1.7–7.7)
Neutrophils Relative %: 79 %
Platelet Count: 214 10*3/uL (ref 150–400)
RBC: 4.51 MIL/uL (ref 4.22–5.81)
RDW: 14.6 % (ref 11.5–15.5)
WBC Count: 7.4 10*3/uL (ref 4.0–10.5)
nRBC: 0 % (ref 0.0–0.2)

## 2021-10-23 MED ORDER — ACETAMINOPHEN 325 MG PO TABS
650.0000 mg | ORAL_TABLET | Freq: Once | ORAL | Status: AC
  Administered 2021-10-23: 650 mg via ORAL
  Filled 2021-10-23: qty 2

## 2021-10-23 MED ORDER — DEXAMETHASONE 4 MG PO TABS
20.0000 mg | ORAL_TABLET | Freq: Once | ORAL | Status: AC
  Administered 2021-10-23: 20 mg via ORAL
  Filled 2021-10-23: qty 5

## 2021-10-23 MED ORDER — CILGAVIMAB (PART OF EVUSHELD) INJECTION
300.0000 mg | Freq: Once | INTRAMUSCULAR | Status: AC
  Administered 2021-10-23: 300 mg via INTRAMUSCULAR

## 2021-10-23 MED ORDER — DIPHENHYDRAMINE HCL 25 MG PO CAPS
50.0000 mg | ORAL_CAPSULE | Freq: Once | ORAL | Status: AC
  Administered 2021-10-23: 50 mg via ORAL
  Filled 2021-10-23: qty 2

## 2021-10-23 MED ORDER — DARATUMUMAB-HYALURONIDASE-FIHJ 1800-30000 MG-UT/15ML ~~LOC~~ SOLN
1800.0000 mg | Freq: Once | SUBCUTANEOUS | Status: AC
  Administered 2021-10-23: 1800 mg via SUBCUTANEOUS
  Filled 2021-10-23: qty 15

## 2021-10-23 MED ORDER — TIXAGEVIMAB (PART OF EVUSHELD) INJECTION
300.0000 mg | Freq: Once | INTRAMUSCULAR | Status: AC
  Administered 2021-10-23: 300 mg via INTRAMUSCULAR

## 2021-10-23 NOTE — Progress Notes (Signed)
[facility] OFFICE PROGRESS NOTE   Diagnosis: Multiple myeloma  INTERVAL HISTORY: Steve Carter returns as scheduled.  He continues to have pain in the feet at night.  The pain is partially relieved with gabapentin.  No other pain.   He continues monthly daratumumab.  No dyspnea.  He is scheduled for follow-up with neurosurgery at [facility] tomorrow.  No recent fall.  Objective:  Vital signs in last 24 hours:  Blood pressure 131/86, pulse 76, temperature 98.1 F (36.7 C), temperature source Oral, resp. rate 20, height 6' 2" (1.88 m), weight 268 lb (121.6 kg), SpO2 98 %.    HEENT: No thrush or ulcer Resp: Lungs clear bilaterally Cardio: Regular rate and rhythm GI: No hepatosplenomegaly Vascular: No leg edema   Lab Results:  Lab Results  Component Value Date   WBC 7.4 10/23/2021   HGB 14.1 10/23/2021   HCT 42.2 10/23/2021   MCV 93.6 10/23/2021   PLT 214 10/23/2021   NEUTROABS 5.8 10/23/2021    CMP  Lab Results  Component Value Date   NA 135 10/23/2021   K 4.5 10/23/2021   CL 96 (L) 10/23/2021   CO2 28 10/23/2021   GLUCOSE 206 (H) 10/23/2021   BUN 14 10/23/2021   CREATININE 1.34 (H) 10/23/2021   CALCIUM 9.7 10/23/2021   PROT 6.8 10/23/2021   ALBUMIN 4.7 10/23/2021   AST 23 10/23/2021   ALT 38 10/23/2021   ALKPHOS 55 10/23/2021   BILITOT 0.4 10/23/2021   GFRNONAA >60 10/23/2021   GFRAA >60 08/10/2020    Medications: I have reviewed the patient's current medications.   Assessment/Plan: Multiple myeloma- IgG lambda serum monoclonal protein, elevated serum lambda light chains Bone survey 10/11/2017-lytic lesions noted in the skull, right iliac, left second rib, and mottled appearance of the proximal femurs/pelvis Bone marrow biopsy 10/14/2017-hypercellular marrow with plasma cell neoplasm, 82% plasma cells, lambda light chain restricted, hyperdiploid with gains of chromosomes 3, 5, 7, 9, and 11.  FISH panel positive for gain of ATM (+11) Cycle  1 RVD 10/18/2017 (Revlimid started 10/23/2017) Cycle 2 RVD 11/15/2017 Cycle 3 RVD 12/13/2017 Cycle 4 RVD 01/14/2018 Cycle 5 of RVD 02/14/2018 (Revlimid given for 7 days and 1 week of Velcade), therapy held beginning 02/21/2018 secondary to plan for stem cell therapy Bone marrow biopsy 02/20/2018, 1-2% plasma cell PET scan 02/20/2018, no malignant range activity above background, numerous lytic lesions throughout the axial and appendicular skeleton,, left iliac wing fracture Melphalan, 200 mg/m on 03/24/2018 Stem cell infusion 03/25/2018 Restaging at [facility] 07/09/2018: Normal lambda light chains, no serum M spike, bone marrow biopsy negative for myeloma, less than 1% plasma cells PET scan at [facility] 07/09/2018- lytic bone lesions, no malignant range activity Initiation of maintenance Revlimid, 10 mg, 21/28 days 08/15/2018 Cycle 2 maintenance Revlimid 09/12/2018 Cycle 3 maintenance Revlimid 10/10/2018 Revlimid placed on hold 06/15/2019 due to presyncopal/syncopal episodes and diarrhea Revlimid resumed 5 mg 21 days on/7 days off following office visit 06/29/2019 Revlimid placed on hold 07/14/2019 Revlimid resumed 08/21/2019 Bone survey 09/09/2019- no acute findings or clear explanation for right buttock pain.  Lucent lesions in the right pelvis are stable without pathologic fracture.  Evidence of healing lytic lesions in the right scapula, cervical spine spinous processes and left L4 transverse process.  Possible mild progression of lytic lesions within the L1 and L4 vertebral bodies.  Stable lytic lesions in the calvarium. PET scan 09/25/2019-new FDG avid bone lesions in the right humeral neck and sacrum similar  remaining lytic lesions with FDG activity below background Bone marrow biopsy at [facility] on 10/01/2019-5% plasma cells on the bone marrow biopsy suboptimal sample  Cycle 1 daratumumab, pomalidomide, Decadron 10/19/2019 Zometa 10/19/2019 Cycle 2 daratumumab, pomalidomide, Decadron 11/16/2019  (he will begin the pomalidomide 11/17/2019) Cycle 3 daratumumab, pomalidomide, and Decadron 12/14/2019 Cycle 4 daratumumab, pomalidomide, and Decadron 01/11/2020 Cycle 5 daratumumab, pomalidomide, Decadron 02/03/2020 (he began pomalidomide on 02/06/2020) Cycle 6 daratumumab, pomalidomide, Decadron 03/02/2020 (pomalidomide starting 03/12/2020) Cycle 7 daratumumab, pomalidomide, Decadron 03/29/2020 (pomalidomide scheduled to start 04/15/2020) Cycle 8 daratumumab, pomalidomide, Decadron 05/04/2020 (pomalidomide started 05/14/2020) Cycle 9 daratumumab-monthly 06/15/2020, pomalidomide held Cycle 10 daratumumab 07/13/2020, pomalidomide 2 mg 21 days beginning 07/15/2020 Cycle 11 daratumumab 08/10/2020, pomalidomide 2 mg 21 days beginning 08/12/2020 Bone marrow biopsy 09/06/2020-hypocellular bone marrow with a relative erythroid hyperplasia and no increase in plasma cells; MRD 0.0050% Cycle 12 daratumumab 09/07/2020, pomalidomide 2 mg 21 days beginning 09/09/2020 Cycle 13 daratumumab 10/11/2020, pomalidomide 2 mg 21 days beginning 10/10/2020 Cycle 14 daratumumab 11/08/2020, pomalidomide reduced to 1 mg 21 days Cycle 15 daratumumab 12/14/2020, Pomalidomide 1 mg 21 days beginning 12/13/2020 Cycle 16 daratumumab 01/12/2021, pomalidomide 1 mg 21 days beginning 01/11/2021 Cycle 17 daratumumab 02/10/2021, pomalidomide 1 mg 21 days beginning 02/09/2021 Cycle 18 daratumumab 03/10/2021, pomalidomide 1 mg 21 days beginning 03/11/2021 Cycle 19 daratumumab 04/07/2021, Pomalidomide 1 mg 21 days beginning 04/08/2021 Cycle 20 daratumumab 05/09/2021, Pomalidomide 1 mg 21 days beginning 05/12/2021 Cycle 21 daratumumab 06/07/2021, pomalidomide held secondary to progressive neuropathy and diarrhea Cycle 22 daratumumab 07/05/2021, pomalidomide held secondary to neuropathy Cycle 23 daratumumab 08/02/2021, Pomalidomide remains on hold Cycle 24 daratumumab 08/30/2021, pomalidomide on hold Cycle 25 daratumumab 09/27/2021, pomalidomide on hold Cycle 26 daratumumab  10/23/2021, pomalidomide on hold   2.  Pain secondary to multiple myeloma involving the spine and pelvis-resolved MRI of the lumbar spine 10/02/2018- numerous rounded foci in the vertebral bodies, hypertrophy of the L4 transverse process   3.  Hypertension-losartan dose increased 11/15/2017   4.  Depression-improved with Wellbutrin beginning September 2020   5.  Altered mental status-improved depression related? 6. Diabetes 7.  Recurrent episodes of fall/syncope-etiology unclear-evaluated by neurology at [facility], MRI brain 01/25/2020 with abnormal signal at the left superior frontal gyrus-potentially representing a low-grade glioma, scattered foci of increased T2 white matter signal-likely related to chronic small vessel disease Stereotactic brain biopsy at [facility] 03/14/2020-gliosis, no evidence of malignancy   8.  Peripheral neuropathy-gabapentin started 07/07/2020; reports increase in symptoms 06/07/2021-pomalidomide placed on hold; reports increase in symptoms 08/02/2021   9.  Renal insufficiency     Disposition: [name] appears stable.  He continue monthly daratumumab.  We will follow-up on a myeloma panel when he returns next month.  He will receive Evusheld today.  He will be scheduled for Zometa when he returns next month.   , MD  10/23/2021  9:44 AM    

## 2021-10-23 NOTE — Progress Notes (Signed)
Patient presents for treatment. RN assessment completed along with the following:  Labs/vitals reviewed - Yes, and within treatment parameters.   Weight within 10% of previous measurement - Yes Oncology Treatment Attestation completed for current therapy- Yes, on date 08/30/2021 Informed consent completed and reflects current therapy/intent - Yes, on date 11/08/2020          Provider progress note reviewed - Yes, today's provider note was reviewed. Treatment/Antibody/Supportive plan reviewed - Yes, and there are no adjustments needed for today's treatment. S&H and other orders reviewed - Yes, and Evusheld will be administered  today. Previous treatment date reviewed - Yes, and the appropriate amount of time has elapsed between treatments.  Patient to proceed with treatment.

## 2021-10-23 NOTE — Patient Instructions (Signed)
[facility]  Discharge Instructions: Thank you for choosing [facility] to provide your oncology and hematology care.   If you have a lab appointment with the [facility], please go directly to the [facility] and check in at the registration area.   Wear comfortable clothing and clothing appropriate for easy access to any Portacath or PICC line.   We strive to give you quality time with your provider. You may need to reschedule your appointment if you arrive late (15 or more minutes).  Arriving late affects you and other patients whose appointments are after yours.  Also, if you miss three or more appointments without notifying the office, you may be dismissed from the clinic at the provider's discretion.      For prescription refill requests, have your pharmacy contact our office and allow 72 hours for refills to be completed.    Today you received the following chemotherapy and/or immunotherapy agents: Dara/Evusheld  Daratumumab injection What is this medication? DARATUMUMAB (dar a toom ue mab) is a monoclonal antibody. It is used to treat multiple myeloma. This medicine may be used for other purposes; ask your health care provider or pharmacist if you have questions. COMMON BRAND NAME(S): DARZALEX What should I tell my care team before I take this medication? They need to know if you have any of these conditions: hereditary fructose intolerance infection (especially a virus infection such as chickenpox, herpes, or hepatitis B virus) lung or breathing disease (asthma, COPD) an unusual or allergic reaction to daratumumab, sorbitol, other medicines, foods, dyes, or preservatives pregnant or trying to get pregnant breast-feeding How should I use this medication? This medicine is for infusion into a vein. It is given by a health care professional in a hospital or clinic setting. Talk to your pediatrician regarding the use of this medicine in  children. Special care may be needed. Overdosage: If you think you have taken too much of this medicine contact a poison control center or emergency room at once. NOTE: This medicine is only for you. Do not share this medicine with others. What if I miss a dose? Keep appointments for follow-up doses as directed. It is important not to miss your dose. Call your doctor or health care professional if you are unable to keep an appointment. What may interact with this medication? Interactions have not been studied. This list may not describe all possible interactions. Give your health care provider a list of all the medicines, herbs, non-prescription drugs, or dietary supplements you use. Also tell them if you smoke, drink alcohol, or use illegal drugs. Some items may interact with your medicine. What should I watch for while using this medication? Your condition will be monitored carefully while you are receiving this medicine. This medicine can cause serious allergic reactions. To reduce your risk, your health care provider may give you other medicine to take before receiving this one. Be sure to follow the directions from your health care provider. This medicine can affect the results of blood tests to match your blood type. These changes can last for up to 6 months after the final dose. Your healthcare provider will do blood tests to match your blood type before you start treatment. Tell all of your healthcare providers that you are being treated with this medicine before receiving a blood transfusion. This medicine can affect the results of some tests used to determine treatment response; extra tests may be needed to evaluate response. Do not  become pregnant while taking this medicine or for 3 months after stopping it. Women should inform their health care provider if they wish to become pregnant or think they might be pregnant. There is a potential for serious side effects to an unborn child. Talk to  your health care provider for more information. Do not breast-feed an infant while taking this medicine. What side effects may I notice from receiving this medication? Side effects that you should report to your care team as soon as possible: Allergic reactions--skin rash, itching or hives, swelling of the face, lips, or tongue Blurred vision Infection--fever, chills, cough, sore throat, pain or trouble passing urine Infusion reactions--dizziness, fast heartbeat, feeling faint or lightheaded, falls, headache, increase in blood pressure, nausea, vomiting, or wheezing or trouble breathing with loud or whistling sounds Unusual bleeding or bruising Side effects that usually do not require medical attention (report to your care team if they continue or are bothersome): Constipation Diarrhea Pain, tingling, numbness in the hands or feet Swelling of the ankles, feet, hands Tiredness This list may not describe all possible side effects. Call your doctor for medical advice about side effects. You may report side effects to FDA at 1-800-FDA-1088. Where should I keep my medication? This drug is given in a hospital or clinic and will not be stored at home. NOTE: This sheet is a summary. It may not cover all possible information. If you have questions about this medicine, talk to your doctor, pharmacist, or health care provider.  2022 Elsevier/Gold Standard (2021-04-27 00:00:00)    Tixagevimab; Cilgavimab Solutions for Injection What is this medication? TIXAGEVIMAB; CILGAVIMAB (tix a jev i mab; sil gav i mab) reduces the risk of getting COVID-19 in persons with immune system problems who may not respond properly to the COVID-19 vaccine or persons who can't receive the COVID-19 vaccine. It belongs to a group of medications called monoclonal antibodies. It may decrease the risk of developing severe symptoms of COVID-19. It may also decrease the chance of going to the hospital. This medication is not  approved by the FDA. The FDA has authorized emergency use of this medication during the COVID-19 pandemic. This medicine may be used for other purposes; ask your health care provider or pharmacist if you have questions. COMMON BRAND NAME(S): EVUSHELD What should I tell my care team before I take this medication? They need to know if you have any of these conditions: any allergies any serious illness bleeding disorder have received a COVID-19 vaccine heart disease an unusual or allergic reaction to tixagevimab, cilgavimab, other medications, foods, dyes, or preservatives pregnant or trying to get pregnant breast-feeding How should I use this medication? This medication is injected into a muscle. It is given by your care team in a hospital or clinic setting. Talk to your care team about the use of this medication in children. While it may be given to children as young as 12 years, precautions do apply. Overdosage: If you think you have taken too much of this medicine contact a poison control center or emergency room at once. NOTE: This medicine is only for you. Do not share this medicine with others. What if I miss a dose? Keep appointments for follow-up doses. It is important not to miss your dose. Call your care team if you are unable to keep an appointment. What may interact with this medication? COVID-19 vaccines This list may not describe all possible interactions. Give your health care provider a list of all the medicines, herbs, non-prescription  drugs, or dietary supplements you use. Also tell them if you smoke, drink alcohol, or use illegal drugs. Some items may interact with your medicine. What should I watch for while using this medication? Your condition will be monitored carefully while you are receiving this medication. Visit your care team for regular checks on your progress. Tell your care team if your symptoms do not start to get better or if they get worse. After receiving  this medication, wait at least 14 days before getting the COVID-19 vaccine. What side effects may I notice from receiving this medication? Side effects that you should report to your care team as soon as possible: Allergic reactions--skin rash, itching, hives, swelling of the face, lips, tongue, or throat Heart attack--pain or tightness in the chest, shoulders, arms, or jaw, nausea, shortness of breath, cold or clammy skin, feeling faint or lightheaded Heart failure--shortness of breath, swelling of the ankles, feet, or hands, sudden weight gain, unusual weakness or fatigue Heart rhythm changes--fast or irregular heartbeat, dizziness, feeling faint or lightheaded, chest pain, trouble breathing Side effects that usually do not require medical attention (report these to your care team if they continue or are bothersome): Cough Fatigue Headache Pain, redness, or irritation at site where injected This list may not describe all possible side effects. Call your doctor for medical advice about side effects. You may report side effects to FDA at 1-800-FDA-1088. Where should I keep my medication? This medication is given in a hospital or clinic. It will not be stored at home. NOTE: This sheet is a summary. It may not cover all possible information. If you have questions about this medicine, talk to your doctor, pharmacist, or health care provider.  2022 Elsevier/Gold Standard (2020-11-10 00:00:00)    To help prevent nausea and vomiting after your treatment, we encourage you to take your nausea medication as directed.  BELOW ARE SYMPTOMS THAT SHOULD BE REPORTED IMMEDIATELY: *FEVER GREATER THAN 100.4 F (38 C) OR HIGHER *CHILLS OR SWEATING *NAUSEA AND VOMITING THAT IS NOT CONTROLLED WITH YOUR NAUSEA MEDICATION *UNUSUAL SHORTNESS OF BREATH *UNUSUAL BRUISING OR BLEEDING *URINARY PROBLEMS (pain or burning when urinating, or frequent urination) *BOWEL PROBLEMS (unusual diarrhea, constipation, pain near  the anus) TENDERNESS IN MOUTH AND THROAT WITH OR WITHOUT PRESENCE OF ULCERS (sore throat, sores in mouth, or a toothache) UNUSUAL RASH, SWELLING OR PAIN  UNUSUAL VAGINAL DISCHARGE OR ITCHING   Items with * indicate a potential emergency and should be followed up as soon as possible or go to the Emergency Department if any problems should occur.  Please show the CHEMOTHERAPY ALERT CARD or IMMUNOTHERAPY ALERT CARD at check-in to the Emergency Department and triage nurse.  Should you have questions after your visit or need to cancel or reschedule your appointment, please contact [facility]  Dept: [phone]  and follow the prompts.  Office hours are 8:00 a.m. to 4:30 p.m. Monday - Friday. Please note that voicemails left after 4:00 p.m. may not be returned until the following business day.  We are closed weekends and major holidays. You have access to a nurse at all times for urgent questions. Please call the main number to the clinic Dept: [phone] and follow the prompts.   For any non-urgent questions, you may also contact your provider using MyChart. We now offer e-Visits for anyone 72 and older to request care online for non-urgent symptoms. For details visit [email or web address].   Also download the MyChart app! Go to the app store,  search "MyChart", open the app, select [facility], and log in with your MyChart username and password.  Due to Covid, a mask is required upon entering the hospital/clinic. If you do not have a mask, one will be given to you upon arrival. For doctor visits, patients may have 1 support person aged 41 or older with them. For treatment visits, patients cannot have anyone with them due to current Covid guidelines and our immunocompromised population.

## 2021-10-25 ENCOUNTER — Encounter: Payer: Self-pay | Admitting: Oncology

## 2021-10-27 ENCOUNTER — Other Ambulatory Visit: Payer: Self-pay | Admitting: Oncology

## 2021-10-27 DIAGNOSIS — C9000 Multiple myeloma not having achieved remission: Secondary | ICD-10-CM

## 2021-10-30 ENCOUNTER — Encounter: Payer: Self-pay | Admitting: Oncology

## 2021-10-30 ENCOUNTER — Other Ambulatory Visit: Payer: Self-pay | Admitting: Nurse Practitioner

## 2021-10-30 DIAGNOSIS — C9000 Multiple myeloma not having achieved remission: Secondary | ICD-10-CM

## 2021-10-30 MED ORDER — OXYCODONE-ACETAMINOPHEN 5-325 MG PO TABS: 1.0000 | ORAL_TABLET | Freq: Three times a day (TID) | ORAL | 0 refills | Status: DC | PRN

## 2021-11-15 ENCOUNTER — Encounter: Payer: Self-pay | Admitting: *Deleted

## 2021-11-15 NOTE — Progress Notes (Signed)
Received fax from [facility] that patient is requesting his oxycodone-apap script be sent there at this current refill. Forwarded request to NP.

## 2021-11-15 NOTE — Progress Notes (Signed)
..  Patient is receiving Assistance Medication - Supplied Externally. Medication: Darzalex FasPro (daratumumab and hyaluronidase-fihj) Manufacture: [employer]  Approval Dates: Approved from 11/15/2021 until 12/02/2021. ID: [id number] Reason: EOOP First DOS: 11/20/2021.  Marland KitchenJuan Quam, CPhT IV Drug Replacement Specialist [facility] Phone: [phone]

## 2021-11-17 ENCOUNTER — Other Ambulatory Visit: Payer: Self-pay | Admitting: Nurse Practitioner

## 2021-11-17 DIAGNOSIS — C9000 Multiple myeloma not having achieved remission: Secondary | ICD-10-CM

## 2021-11-17 MED ORDER — OXYCODONE-ACETAMINOPHEN 5-325 MG PO TABS: 1.0000 | ORAL_TABLET | Freq: Three times a day (TID) | ORAL | 0 refills | Status: DC | PRN

## 2021-11-19 ENCOUNTER — Other Ambulatory Visit: Payer: Self-pay | Admitting: Oncology

## 2021-11-20 ENCOUNTER — Inpatient Hospital Stay: Payer: Medicare PPO

## 2021-11-20 ENCOUNTER — Inpatient Hospital Stay: Payer: Medicare PPO | Attending: Nurse Practitioner | Admitting: Oncology

## 2021-11-20 ENCOUNTER — Other Ambulatory Visit: Payer: Self-pay

## 2021-11-20 ENCOUNTER — Encounter: Payer: Self-pay | Admitting: *Deleted

## 2021-11-20 VITALS — BP 120/73 | HR 72 | Temp 97.5°F | Resp 18

## 2021-11-20 DIAGNOSIS — C9000 Multiple myeloma not having achieved remission: Secondary | ICD-10-CM

## 2021-11-20 DIAGNOSIS — Z5112 Encounter for antineoplastic immunotherapy: Secondary | ICD-10-CM | POA: Diagnosis not present

## 2021-11-20 DIAGNOSIS — G893 Neoplasm related pain (acute) (chronic): Secondary | ICD-10-CM | POA: Insufficient documentation

## 2021-11-20 DIAGNOSIS — E119 Type 2 diabetes mellitus without complications: Secondary | ICD-10-CM | POA: Insufficient documentation

## 2021-11-20 DIAGNOSIS — N289 Disorder of kidney and ureter, unspecified: Secondary | ICD-10-CM | POA: Insufficient documentation

## 2021-11-20 DIAGNOSIS — I6782 Cerebral ischemia: Secondary | ICD-10-CM | POA: Insufficient documentation

## 2021-11-20 DIAGNOSIS — F32A Depression, unspecified: Secondary | ICD-10-CM | POA: Insufficient documentation

## 2021-11-20 DIAGNOSIS — I10 Essential (primary) hypertension: Secondary | ICD-10-CM | POA: Insufficient documentation

## 2021-11-20 DIAGNOSIS — G629 Polyneuropathy, unspecified: Secondary | ICD-10-CM | POA: Insufficient documentation

## 2021-11-20 DIAGNOSIS — Z79899 Other long term (current) drug therapy: Secondary | ICD-10-CM | POA: Diagnosis not present

## 2021-11-20 DIAGNOSIS — M79673 Pain in unspecified foot: Secondary | ICD-10-CM | POA: Diagnosis not present

## 2021-11-20 DIAGNOSIS — R251 Tremor, unspecified: Secondary | ICD-10-CM | POA: Diagnosis not present

## 2021-11-20 LAB — CBC WITH DIFFERENTIAL (CANCER CENTER ONLY)
Abs Immature Granulocytes: 0.1 10*3/uL — ABNORMAL HIGH (ref 0.00–0.07)
Basophils Absolute: 0.1 10*3/uL (ref 0.0–0.1)
Basophils Relative: 1 %
Eosinophils Absolute: 0.1 10*3/uL (ref 0.0–0.5)
Eosinophils Relative: 1 %
HCT: 46 % (ref 39.0–52.0)
Hemoglobin: 15.2 g/dL (ref 13.0–17.0)
Immature Granulocytes: 1 %
Lymphocytes Relative: 8 %
Lymphs Abs: 0.8 10*3/uL (ref 0.7–4.0)
MCH: 31.1 pg (ref 26.0–34.0)
MCHC: 33 g/dL (ref 30.0–36.0)
MCV: 94.1 fL (ref 80.0–100.0)
Monocytes Absolute: 0.6 10*3/uL (ref 0.1–1.0)
Monocytes Relative: 6 %
Neutro Abs: 8.3 10*3/uL — ABNORMAL HIGH (ref 1.7–7.7)
Neutrophils Relative %: 83 %
Platelet Count: 280 10*3/uL (ref 150–400)
RBC: 4.89 MIL/uL (ref 4.22–5.81)
RDW: 14.2 % (ref 11.5–15.5)
WBC Count: 9.9 10*3/uL (ref 4.0–10.5)
nRBC: 0.2 % (ref 0.0–0.2)

## 2021-11-20 LAB — CMP (CANCER CENTER ONLY)
ALT: 21 U/L (ref 0–44)
AST: 18 U/L (ref 15–41)
Albumin: 4.5 g/dL (ref 3.5–5.0)
Alkaline Phosphatase: 64 U/L (ref 38–126)
Anion gap: 10 (ref 5–15)
BUN: 20 mg/dL (ref 6–20)
CO2: 30 mmol/L (ref 22–32)
Calcium: 9.7 mg/dL (ref 8.9–10.3)
Chloride: 92 mmol/L — ABNORMAL LOW (ref 98–111)
Creatinine: 1.33 mg/dL — ABNORMAL HIGH (ref 0.61–1.24)
GFR, Estimated: >60 mL/min (ref >60)
Glucose, Bld: 238 mg/dL — ABNORMAL HIGH (ref 70–99)
Potassium: 4.5 mmol/L (ref 3.5–5.1)
Sodium: 132 mmol/L — ABNORMAL LOW (ref 135–145)
Total Bilirubin: 0.4 mg/dL (ref 0.3–1.2)
Total Protein: 6.8 g/dL (ref 6.5–8.1)

## 2021-11-20 MED ORDER — DARATUMUMAB-HYALURONIDASE-FIHJ 1800-30000 MG-UT/15ML ~~LOC~~ SOLN
1800.0000 mg | Freq: Once | SUBCUTANEOUS | Status: AC
  Administered 2021-11-20: 1800 mg via SUBCUTANEOUS
  Filled 2021-11-20: qty 15

## 2021-11-20 MED ORDER — DEXAMETHASONE 4 MG PO TABS
20.0000 mg | ORAL_TABLET | Freq: Once | ORAL | Status: AC
  Administered 2021-11-20: 09:00:00 20 mg via ORAL
  Filled 2021-11-20: qty 5

## 2021-11-20 MED ORDER — DIPHENHYDRAMINE HCL 25 MG PO CAPS
50.0000 mg | ORAL_CAPSULE | Freq: Once | ORAL | Status: AC
  Administered 2021-11-20: 09:00:00 50 mg via ORAL
  Filled 2021-11-20: qty 2

## 2021-11-20 MED ORDER — SODIUM CHLORIDE 0.9 % IV SOLN: Freq: Once | INTRAVENOUS | Status: AC

## 2021-11-20 MED ORDER — OXYCODONE-ACETAMINOPHEN 5-325 MG PO TABS: 1.0000 | ORAL_TABLET | Freq: Three times a day (TID) | ORAL | 0 refills | Status: DC | PRN

## 2021-11-20 MED ORDER — ACETAMINOPHEN 325 MG PO TABS
650.0000 mg | ORAL_TABLET | Freq: Once | ORAL | Status: AC
  Administered 2021-11-20: 09:00:00 650 mg via ORAL
  Filled 2021-11-20: qty 2

## 2021-11-20 MED ORDER — ZOLEDRONIC ACID 4 MG/100ML IV SOLN
4.0000 mg | Freq: Once | INTRAVENOUS | Status: AC
  Administered 2021-11-20: 10:00:00 4 mg via INTRAVENOUS
  Filled 2021-11-20: qty 100

## 2021-11-20 NOTE — Progress Notes (Signed)
Patient seen by Dr. Benay Spice today  Vitals are within treatment parameters.  Labs reviewed by Dr. Benay Spice and are within treatment parameters. CMP is still pending  Per physician team, patient is ready for treatment and there are NO modifications to the treatment plan.

## 2021-11-20 NOTE — Patient Instructions (Signed)
[facility]  Discharge Instructions: °Thank you for choosing [facility] to provide your oncology and hematology care.  ° °If you have a lab appointment with the Cancer Center, please go directly to the Cancer Center and check in at the registration area. °  °Wear comfortable clothing and clothing appropriate for easy access to any Portacath or PICC line.  ° °We strive to give you quality time with your provider. You may need to reschedule your appointment if you arrive late (15 or more minutes).  Arriving late affects you and other patients whose appointments are after yours.  Also, if you miss three or more appointments without notifying the office, you may be dismissed from the clinic at the provider’s discretion.    °  °For prescription refill requests, have your pharmacy contact our office and allow 72 hours for refills to be completed.   ° °Today you received the following chemotherapy and/or immunotherapy agents: Dara/Evusheld ° °Daratumumab injection °What is this medication? °DARATUMUMAB (dar a toom ue mab) is a monoclonal antibody. It is used to treat multiple myeloma. °This medicine may be used for other purposes; ask your health care provider or pharmacist if you have questions. °COMMON BRAND NAME(S): DARZALEX °What should I tell my care team before I take this medication? °They need to know if you have any of these conditions: °hereditary fructose intolerance °infection (especially a virus infection such as chickenpox, herpes, or hepatitis B virus) °lung or breathing disease (asthma, COPD) °an unusual or allergic reaction to daratumumab, sorbitol, other medicines, foods, dyes, or preservatives °pregnant or trying to get pregnant °breast-feeding °How should I use this medication? °This medicine is for infusion into a vein. It is given by a health care professional in a hospital or clinic setting. °Talk to your pediatrician regarding the use of this medicine in  children. Special care may be needed. °Overdosage: If you think you have taken too much of this medicine contact a poison control center or emergency room at once. °NOTE: This medicine is only for you. Do not share this medicine with others. °What if I miss a dose? °Keep appointments for follow-up doses as directed. It is important not to miss your dose. Call your doctor or health care professional if you are unable to keep an appointment. °What may interact with this medication? °Interactions have not been studied. °This list may not describe all possible interactions. Give your health care provider a list of all the medicines, herbs, non-prescription drugs, or dietary supplements you use. Also tell them if you smoke, drink alcohol, or use illegal drugs. Some items may interact with your medicine. °What should I watch for while using this medication? °Your condition will be monitored carefully while you are receiving this medicine. °This medicine can cause serious allergic reactions. To reduce your risk, your health care provider may give you other medicine to take before receiving this one. Be sure to follow the directions from your health care provider. °This medicine can affect the results of blood tests to match your blood type. These changes can last for up to 6 months after the final dose. Your healthcare provider will do blood tests to match your blood type before you start treatment. Tell all of your healthcare providers that you are being treated with this medicine before receiving a blood transfusion. °This medicine can affect the results of some tests used to determine treatment response; extra tests may be needed to evaluate response. °Do not   become pregnant while taking this medicine or for 3 months after stopping it. Women should inform their health care provider if they wish to become pregnant or think they might be pregnant. There is a potential for serious side effects to an unborn child. Talk to  your health care provider for more information. Do not breast-feed an infant while taking this medicine. °What side effects may I notice from receiving this medication? °Side effects that you should report to your care team as soon as possible: °Allergic reactions--skin rash, itching or hives, swelling of the face, lips, or tongue °Blurred vision °Infection--fever, chills, cough, sore throat, pain or trouble passing urine °Infusion reactions--dizziness, fast heartbeat, feeling faint or lightheaded, falls, headache, increase in blood pressure, nausea, vomiting, or wheezing or trouble breathing with loud or whistling sounds °Unusual bleeding or bruising °Side effects that usually do not require medical attention (report to your care team if they continue or are bothersome): °Constipation °Diarrhea °Pain, tingling, numbness in the hands or feet °Swelling of the ankles, feet, hands °Tiredness °This list may not describe all possible side effects. Call your doctor for medical advice about side effects. You may report side effects to FDA at 1-800-FDA-1088. °Where should I keep my medication? °This drug is given in a hospital or clinic and will not be stored at home. °NOTE: This sheet is a summary. It may not cover all possible information. If you have questions about this medicine, talk to your doctor, pharmacist, or health care provider. °© 2022 Elsevier/Gold Standard (2021-04-27 00:00:00) °  °Zoledronic Acid Injection (Hypercalcemia, Oncology) °What is this medication? °ZOLEDRONIC ACID (ZOE le dron ik AS id) slows calcium loss from bones. It high calcium levels in the blood from some kinds of cancer. It may be used in other people at risk for bone loss. °This medicine may be used for other purposes; ask your health care provider or pharmacist if you have questions. °COMMON BRAND NAME(S): Zometa °What should I tell my care team before I take this medication? °They need to know if you have any of these  conditions: °cancer °dehydration °dental disease °kidney disease °liver disease °low levels of calcium in the blood °lung or breathing disease (asthma) °receiving steroids like dexamethasone or prednisone °an unusual or allergic reaction to zoledronic acid, other medicines, foods, dyes, or preservatives °pregnant or trying to get pregnant °breast-feeding °How should I use this medication? °This drug is injected into a vein. It is given by a health care provider in a hospital or clinic setting. °Talk to your health care provider about the use of this drug in children. Special care may be needed. °Overdosage: If you think you have taken too much of this medicine contact a poison control center or emergency room at once. °NOTE: This medicine is only for you. Do not share this medicine with others. °What if I miss a dose? °Keep appointments for follow-up doses. It is important not to miss your dose. Call your health care provider if you are unable to keep an appointment. °What may interact with this medication? °certain antibiotics given by injection °NSAIDs, medicines for pain and inflammation, like ibuprofen or naproxen °some diuretics like bumetanide, furosemide °teriparatide °thalidomide °This list may not describe all possible interactions. Give your health care provider a list of all the medicines, herbs, non-prescription drugs, or dietary supplements you use. Also tell them if you smoke, drink alcohol, or use illegal drugs. Some items may interact with your medicine. °What should I watch for while using   this medication? °Visit your health care provider for regular checks on your progress. It may be some time before you see the benefit from this drug. °Some people who take this drug have severe bone, joint, or muscle pain. This drug may also increase your risk for jaw problems or a broken thigh bone. Tell your health care provider right away if you have severe pain in your jaw, bones, joints, or muscles. Tell you  health care provider if you have any pain that does not go away or that gets worse. °Tell your dentist and dental surgeon that you are taking this drug. You should not have major dental surgery while on this drug. See your dentist to have a dental exam and fix any dental problems before starting this drug. Take good care of your teeth while on this drug. Make sure you see your dentist for regular follow-up appointments. °You should make sure you get enough calcium and vitamin D while you are taking this drug. Discuss the foods you eat and the vitamins you take with your health care provider. °Check with your health care provider if you have severe diarrhea, nausea, and vomiting, or if you sweat a lot. The loss of too much body fluid may make it dangerous for you to take this drug. °You may need blood work done while you are taking this drug. °Do not become pregnant while taking this drug. Women should inform their health care provider if they wish to become pregnant or think they might be pregnant. There is potential for serious harm to an unborn child. Talk to your health care provider for more information. °What side effects may I notice from receiving this medication? °Side effects that you should report to your doctor or health care provider as soon as possible: °allergic reactions (skin rash, itching or hives; swelling of the face, lips, or tongue) °bone pain °infection (fever, chills, cough, sore throat, pain or trouble passing urine) °jaw pain, especially after dental work °joint pain °kidney injury (trouble passing urine or change in the amount of urine) °low blood pressure (dizziness; feeling faint or lightheaded, falls; unusually weak or tired) °low calcium levels (fast heartbeat; muscle cramps or pain; pain, tingling, or numbness in the hands or feet; seizures) °low magnesium levels (fast, irregular heartbeat; muscle cramp or pain; muscle weakness; tremors; seizures) °low red blood cell counts (trouble  breathing; feeling faint; lightheaded, falls; unusually weak or tired) °muscle pain °redness, blistering, peeling, or loosening of the skin, including inside the mouth °severe diarrhea °swelling of the ankles, feet, hands °trouble breathing °Side effects that usually do not require medical attention (report to your doctor or health care provider if they continue or are bothersome): °anxious °constipation °coughing °depressed mood °eye irritation, itching, or pain °fever °general ill feeling or flu-like symptoms °nausea °pain, redness, or irritation at site where injected °trouble sleeping °This list may not describe all possible side effects. Call your doctor for medical advice about side effects. You may report side effects to FDA at 1-800-FDA-1088. °Where should I keep my medication? °This drug is given in a hospital or clinic. It will not be stored at home. °NOTE: This sheet is a summary. It may not cover all possible information. If you have questions about this medicine, talk to your doctor, pharmacist, or health care provider. °© 2022 Elsevier/Gold Standard (2021-08-08 00:00:00) ° °

## 2021-11-20 NOTE — Progress Notes (Signed)
Patient presents for treatment. RN assessment completed along with the following:  Labs/vitals reviewed - Yes, and within treatment parameters.   Weight within 10% of previous measurement - Yes Oncology Treatment Attestation completed for current therapy- Yes, on date 08/30/21 Informed consent completed and reflects current therapy/intent - Yes, on date 10/20/21             Provider progress note reviewed - Yes, today's provider note was reviewed. Treatment/Antibody/Supportive plan reviewed - Yes, and there are no adjustments needed for today's treatment. S&H and other orders reviewed - Yes, and there are no additional orders identified. Previous treatment date reviewed - Yes, and the appropriate amount of time has elapsed between treatments.  Patient to proceed with treatment.

## 2021-11-20 NOTE — Progress Notes (Signed)
[place] OFFICE PROGRESS NOTE   Diagnosis: Multiple myeloma  INTERVAL HISTORY:   Steve Carter returns as scheduled.  He continues to have a tremor.  He was seen by neurosurgery at [facility] 10/25/2021.  A brain MRI was unchanged.  He takes oxycodone for foot pain at night.  His wife requested we look at several skin lesions.  Objective:  Vital signs in last 24 hours:  Blood pressure 118/77, pulse 81, temperature 97.8 F (36.6 C), temperature source Oral, resp. rate 18, height $RemoveBe'6\' 2"'pFPsWOAlL$  (1.88 m), weight 265 lb (120.2 kg), SpO2 100 %.    HEENT: No thrush Resp: Lungs clear bilaterally Cardio: Regular rate and rhythm GI: No hepatosplenomegaly, nontender, no mass Vascular:  No leg edema  Skin: Raised clear half centimeter lesion at the left medial malar area, benign-appearing moles at the right mid back and right medial thigh  Lab Results:  Lab Results  Component Value Date   WBC 9.9 11/20/2021   HGB 15.2 11/20/2021   HCT 46.0 11/20/2021   MCV 94.1 11/20/2021   PLT 280 11/20/2021   NEUTROABS 8.3 (H) 11/20/2021    CMP  Lab Results  Component Value Date   NA 135 10/23/2021   K 4.5 10/23/2021   CL 96 (L) 10/23/2021   CO2 28 10/23/2021   GLUCOSE 206 (H) 10/23/2021   BUN 14 10/23/2021   CREATININE 1.34 (H) 10/23/2021   CALCIUM 9.7 10/23/2021   PROT 6.8 10/23/2021   ALBUMIN 4.7 10/23/2021   AST 23 10/23/2021   ALT 38 10/23/2021   ALKPHOS 55 10/23/2021   BILITOT 0.4 10/23/2021   GFRNONAA >60 10/23/2021   GFRAA >60 08/10/2020     Medications: I have reviewed the patient's current medications.   Assessment/Plan: Multiple myeloma- IgG lambda serum monoclonal protein, elevated serum lambda light chains Bone survey 10/11/2017-lytic lesions noted in the skull, right iliac, left second rib, and mottled appearance of the proximal femurs/pelvis Bone marrow biopsy 10/14/2017-hypercellular marrow with plasma cell neoplasm, 82% plasma cells, lambda light  chain restricted, hyperdiploid with gains of chromosomes 3, 5, 7, 9, and 11.  FISH panel positive for gain of ATM (+11) Cycle 1 RVD 10/18/2017 (Revlimid started 10/23/2017) Cycle 2 RVD 11/15/2017 Cycle 3 RVD 12/13/2017 Cycle 4 RVD 01/14/2018 Cycle 5 of RVD 02/14/2018 (Revlimid given for 7 days and 1 week of Velcade), therapy held beginning 02/21/2018 secondary to plan for stem cell therapy Bone marrow biopsy 02/20/2018, 1-2% plasma cell PET scan 02/20/2018, no malignant range activity above background, numerous lytic lesions throughout the axial and appendicular skeleton,, left iliac wing fracture Melphalan, 200 mg/m on 03/24/2018 Stem cell infusion 03/25/2018 Restaging at [facility] 07/09/2018: Normal lambda light chains, no serum M spike, bone marrow biopsy negative for myeloma, less than 1% plasma cells PET scan at [facility] 07/09/2018- lytic bone lesions, no malignant range activity Initiation of maintenance Revlimid, 10 mg, 21/28 days 08/15/2018 Cycle 2 maintenance Revlimid 09/12/2018 Cycle 3 maintenance Revlimid 10/10/2018 Revlimid placed on hold 06/15/2019 due to presyncopal/syncopal episodes and diarrhea Revlimid resumed 5 mg 21 days on/7 days off following office visit 06/29/2019 Revlimid placed on hold 07/14/2019 Revlimid resumed 08/21/2019 Bone survey 09/09/2019- no acute findings or clear explanation for right buttock pain.  Lucent lesions in the right pelvis are stable without pathologic fracture.  Evidence of healing lytic lesions in the right scapula, cervical spine spinous processes and left L4 transverse process.  Possible mild progression of lytic lesions within the L1 and L4 vertebral  bodies.  Stable lytic lesions in the calvarium. PET scan 09/25/2019-new FDG avid bone lesions in the right humeral neck and sacrum similar remaining lytic lesions with FDG activity below background Bone marrow biopsy at [facility] on 10/01/2019-5% plasma cells on the bone marrow biopsy suboptimal sample   Cycle 1 daratumumab, pomalidomide, Decadron 10/19/2019 Zometa 10/19/2019 Cycle 2 daratumumab, pomalidomide, Decadron 11/16/2019 (he will begin the pomalidomide 11/17/2019) Cycle 3 daratumumab, pomalidomide, and Decadron 12/14/2019 Cycle 4 daratumumab, pomalidomide, and Decadron 01/11/2020 Cycle 5 daratumumab, pomalidomide, Decadron 02/03/2020 (he began pomalidomide on 02/06/2020) Cycle 6 daratumumab, pomalidomide, Decadron 03/02/2020 (pomalidomide starting 03/12/2020) Cycle 7 daratumumab, pomalidomide, Decadron 03/29/2020 (pomalidomide scheduled to start 04/15/2020) Cycle 8 daratumumab, pomalidomide, Decadron 05/04/2020 (pomalidomide started 05/14/2020) Cycle 9 daratumumab-monthly 06/15/2020, pomalidomide held Cycle 10 daratumumab 07/13/2020, pomalidomide 2 mg 21 days beginning 07/15/2020 Cycle 11 daratumumab 08/10/2020, pomalidomide 2 mg 21 days beginning 08/12/2020 Bone marrow biopsy 09/06/2020-hypocellular bone marrow with a relative erythroid hyperplasia and no increase in plasma cells; MRD 0.0050% Cycle 12 daratumumab 09/07/2020, pomalidomide 2 mg 21 days beginning 09/09/2020 Cycle 13 daratumumab 10/11/2020, pomalidomide 2 mg 21 days beginning 10/10/2020 Cycle 14 daratumumab 11/08/2020, pomalidomide reduced to 1 mg 21 days Cycle 15 daratumumab 12/14/2020, Pomalidomide 1 mg 21 days beginning 12/13/2020 Cycle 16 daratumumab 01/12/2021, pomalidomide 1 mg 21 days beginning 01/11/2021 Cycle 17 daratumumab 02/10/2021, pomalidomide 1 mg 21 days beginning 02/09/2021 Cycle 18 daratumumab 03/10/2021, pomalidomide 1 mg 21 days beginning 03/11/2021 Cycle 19 daratumumab 04/07/2021, Pomalidomide 1 mg 21 days beginning 04/08/2021 Cycle 20 daratumumab 05/09/2021, Pomalidomide 1 mg 21 days beginning 05/12/2021 Cycle 21 daratumumab 06/07/2021, pomalidomide held secondary to progressive neuropathy and diarrhea Cycle 22 daratumumab 07/05/2021, pomalidomide held secondary to neuropathy Cycle 23 daratumumab 08/02/2021, Pomalidomide remains on hold Cycle  24 daratumumab 08/30/2021, pomalidomide on hold Cycle 25 daratumumab 09/27/2021, pomalidomide on hold Cycle 26 daratumumab 10/23/2021, pomalidomide on hold Cycle 27 daratumumab 11/20/2021, pomalidomide on hold   2.  Pain secondary to multiple myeloma involving the spine and pelvis-resolved MRI of the lumbar spine 10/02/2018- numerous rounded foci in the vertebral bodies, hypertrophy of the L4 transverse process   3.  Hypertension-losartan dose increased 11/15/2017   4.  Depression-improved with Wellbutrin beginning September 2020   5.  Altered mental status-improved depression related? 6. Diabetes 7.  Recurrent episodes of fall/syncope-etiology unclear-evaluated by neurology at [facility], MRI brain 01/25/2020 with abnormal signal at the left superior frontal gyrus-potentially representing a low-grade glioma, scattered foci of increased T2 white matter signal-likely related to chronic small vessel disease Stereotactic brain biopsy at [facility] 03/14/2020-gliosis, no evidence of malignancy   8.  Peripheral neuropathy-gabapentin started 07/07/2020; reports increase in symptoms 06/07/2021-pomalidomide placed on hold; reports increase in symptoms 08/02/2021   9.  Renal insufficiency      Disposition: Steve Carter appears stable.  He will complete another treat with daratumumab today.  I refilled his prescription for oxycodone.  He will receive Zometa today.  He will return for an office visit and daratumumab in 1 month.  We made a dermatology referral to evaluate the skin lesions.  The lesion at the left medial malar area may be a basal cell carcinoma.  Betsy Coder, MD  11/20/2021  8:41 AM

## 2021-11-20 NOTE — Progress Notes (Signed)
Faxed referral order, demographics and medical records to Dr. Lavonna Monarch at [phone] for referral r/t evaluate moles.

## 2021-11-21 LAB — KAPPA/LAMBDA LIGHT CHAINS
Kappa free light chain: 5.9 mg/L (ref 3.3–19.4)
Kappa, lambda light chain ratio: 0.14 — ABNORMAL LOW (ref 0.26–1.65)
Lambda free light chains: 42.4 mg/L — ABNORMAL HIGH (ref 5.7–26.3)

## 2021-11-21 LAB — IGG, IGA, IGM
IgA: 38 mg/dL — ABNORMAL LOW (ref 90–386)
IgG (Immunoglobin G), Serum: 485 mg/dL — ABNORMAL LOW (ref 603–1613)
IgM (Immunoglobulin M), Srm: 11 mg/dL — ABNORMAL LOW (ref 20–172)

## 2021-11-23 ENCOUNTER — Encounter: Payer: Self-pay | Admitting: Oncology

## 2021-11-23 LAB — PROTEIN ELECTROPHORESIS, SERUM
A/G Ratio: 1.4 (ref 0.7–1.7)
Albumin ELP: 3.8 g/dL (ref 2.9–4.4)
Alpha-1-Globulin: 0.2 g/dL (ref 0.0–0.4)
Alpha-2-Globulin: 1.2 g/dL — ABNORMAL HIGH (ref 0.4–1.0)
Beta Globulin: 0.9 g/dL (ref 0.7–1.3)
Gamma Globulin: 0.5 g/dL (ref 0.4–1.8)
Globulin, Total: 2.7 g/dL (ref 2.2–3.9)
M-Spike, %: 0.2 g/dL — ABNORMAL HIGH
Total Protein ELP: 6.5 g/dL (ref 6.0–8.5)

## 2021-11-27 ENCOUNTER — Encounter: Payer: Self-pay | Admitting: Oncology

## 2021-11-29 ENCOUNTER — Other Ambulatory Visit: Payer: Self-pay

## 2021-11-29 DIAGNOSIS — C9000 Multiple myeloma not having achieved remission: Secondary | ICD-10-CM

## 2021-11-30 ENCOUNTER — Encounter: Payer: Self-pay | Admitting: Oncology

## 2021-12-08 ENCOUNTER — Encounter: Payer: Self-pay | Admitting: Oncology

## 2021-12-08 ENCOUNTER — Other Ambulatory Visit: Payer: Self-pay | Admitting: *Deleted

## 2021-12-08 ENCOUNTER — Encounter: Payer: Self-pay | Admitting: *Deleted

## 2021-12-08 DIAGNOSIS — C9000 Multiple myeloma not having achieved remission: Secondary | ICD-10-CM

## 2021-12-08 NOTE — Progress Notes (Signed)
Wait time to see Dr. Denna Haggard at [facility] is June 2023. New referral placed to [facility] per Dr. Benay Spice. Faxed referral information to office and notified Mrs. Santoni.

## 2021-12-11 ENCOUNTER — Encounter: Payer: Self-pay | Admitting: *Deleted

## 2021-12-12 ENCOUNTER — Other Ambulatory Visit: Payer: Self-pay

## 2021-12-12 ENCOUNTER — Telehealth: Payer: Self-pay | Admitting: Oncology

## 2021-12-12 DIAGNOSIS — C9000 Multiple myeloma not having achieved remission: Secondary | ICD-10-CM

## 2021-12-12 NOTE — Telephone Encounter (Signed)
LEUKEMIA & LYMPHOMA: Pt has been approved to received up to $13,000 to assist with insurance premiums and qualifying tx expenses for chemo and oral meds.  Valid: 12/04/2021 - 12/08/2022 Id: [id number] BIN: [id number] PCN: [id number] GRP: [id number]   [employer]uk[employer] Co-pay Assistance [place] [address] [place] [address] Phone: [phone] Fax: [phone] Internet: [email or web address]

## 2021-12-13 ENCOUNTER — Encounter: Payer: Self-pay | Admitting: *Deleted

## 2021-12-13 ENCOUNTER — Encounter: Payer: Self-pay | Admitting: Oncology

## 2021-12-13 NOTE — Progress Notes (Signed)
Faxed referral order, demographics and chart information to [facility] # [phone]

## 2021-12-17 ENCOUNTER — Other Ambulatory Visit: Payer: Self-pay | Admitting: Oncology

## 2021-12-18 ENCOUNTER — Other Ambulatory Visit: Payer: Self-pay

## 2021-12-18 ENCOUNTER — Encounter: Payer: Self-pay | Admitting: Nurse Practitioner

## 2021-12-18 ENCOUNTER — Inpatient Hospital Stay: Payer: Medicare PPO

## 2021-12-18 ENCOUNTER — Encounter: Payer: Self-pay | Admitting: Oncology

## 2021-12-18 ENCOUNTER — Inpatient Hospital Stay: Payer: Medicare PPO | Attending: Nurse Practitioner | Admitting: Nurse Practitioner

## 2021-12-18 ENCOUNTER — Encounter: Payer: Self-pay | Admitting: *Deleted

## 2021-12-18 VITALS — BP 125/84 | HR 72 | Temp 97.8°F | Resp 18 | Ht 74.0 in | Wt 269.8 lb

## 2021-12-18 DIAGNOSIS — G629 Polyneuropathy, unspecified: Secondary | ICD-10-CM | POA: Diagnosis not present

## 2021-12-18 DIAGNOSIS — Z79899 Other long term (current) drug therapy: Secondary | ICD-10-CM | POA: Diagnosis not present

## 2021-12-18 DIAGNOSIS — M545 Low back pain, unspecified: Secondary | ICD-10-CM | POA: Insufficient documentation

## 2021-12-18 DIAGNOSIS — N289 Disorder of kidney and ureter, unspecified: Secondary | ICD-10-CM | POA: Insufficient documentation

## 2021-12-18 DIAGNOSIS — F32A Depression, unspecified: Secondary | ICD-10-CM | POA: Diagnosis not present

## 2021-12-18 DIAGNOSIS — I10 Essential (primary) hypertension: Secondary | ICD-10-CM | POA: Diagnosis not present

## 2021-12-18 DIAGNOSIS — Z5112 Encounter for antineoplastic immunotherapy: Secondary | ICD-10-CM | POA: Insufficient documentation

## 2021-12-18 DIAGNOSIS — C9000 Multiple myeloma not having achieved remission: Secondary | ICD-10-CM

## 2021-12-18 DIAGNOSIS — G893 Neoplasm related pain (acute) (chronic): Secondary | ICD-10-CM | POA: Insufficient documentation

## 2021-12-18 DIAGNOSIS — I6782 Cerebral ischemia: Secondary | ICD-10-CM | POA: Insufficient documentation

## 2021-12-18 LAB — CBC WITH DIFFERENTIAL (CANCER CENTER ONLY)
Abs Immature Granulocytes: 0.09 10*3/uL — ABNORMAL HIGH (ref 0.00–0.07)
Basophils Absolute: 0 10*3/uL (ref 0.0–0.1)
Basophils Relative: 1 %
Eosinophils Absolute: 0.1 10*3/uL (ref 0.0–0.5)
Eosinophils Relative: 1 %
HCT: 46 % (ref 39.0–52.0)
Hemoglobin: 15.3 g/dL (ref 13.0–17.0)
Immature Granulocytes: 1 %
Lymphocytes Relative: 10 %
Lymphs Abs: 0.9 10*3/uL (ref 0.7–4.0)
MCH: 30.8 pg (ref 26.0–34.0)
MCHC: 33.3 g/dL (ref 30.0–36.0)
MCV: 92.6 fL (ref 80.0–100.0)
Monocytes Absolute: 0.7 10*3/uL (ref 0.1–1.0)
Monocytes Relative: 8 %
Neutro Abs: 7 10*3/uL (ref 1.7–7.7)
Neutrophils Relative %: 79 %
Platelet Count: 244 10*3/uL (ref 150–400)
RBC: 4.97 MIL/uL (ref 4.22–5.81)
RDW: 14.5 % (ref 11.5–15.5)
WBC Count: 8.8 10*3/uL (ref 4.0–10.5)
nRBC: 0 % (ref 0.0–0.2)

## 2021-12-18 LAB — CMP (CANCER CENTER ONLY)
ALT: 20 U/L (ref 0–44)
AST: 18 U/L (ref 15–41)
Albumin: 4.4 g/dL (ref 3.5–5.0)
Alkaline Phosphatase: 52 U/L (ref 38–126)
Anion gap: 9 (ref 5–15)
BUN: 15 mg/dL (ref 6–20)
CO2: 28 mmol/L (ref 22–32)
Calcium: 9.4 mg/dL (ref 8.9–10.3)
Chloride: 97 mmol/L — ABNORMAL LOW (ref 98–111)
Creatinine: 1.25 mg/dL — ABNORMAL HIGH (ref 0.61–1.24)
GFR, Estimated: >60 mL/min (ref >60)
Glucose, Bld: 166 mg/dL — ABNORMAL HIGH (ref 70–99)
Potassium: 4.3 mmol/L (ref 3.5–5.1)
Sodium: 134 mmol/L — ABNORMAL LOW (ref 135–145)
Total Bilirubin: 0.3 mg/dL (ref 0.3–1.2)
Total Protein: 6.9 g/dL (ref 6.5–8.1)

## 2021-12-18 MED ORDER — DEXAMETHASONE 4 MG PO TABS
20.0000 mg | ORAL_TABLET | Freq: Once | ORAL | Status: AC
  Administered 2021-12-18: 20 mg via ORAL
  Filled 2021-12-18: qty 5

## 2021-12-18 MED ORDER — ACETAMINOPHEN 325 MG PO TABS
650.0000 mg | ORAL_TABLET | Freq: Once | ORAL | Status: AC
  Administered 2021-12-18: 650 mg via ORAL
  Filled 2021-12-18: qty 2

## 2021-12-18 MED ORDER — OXYCODONE-ACETAMINOPHEN 5-325 MG PO TABS: 1.0000 | ORAL_TABLET | Freq: Three times a day (TID) | ORAL | 0 refills | Status: DC | PRN

## 2021-12-18 MED ORDER — DARATUMUMAB-HYALURONIDASE-FIHJ 1800-30000 MG-UT/15ML ~~LOC~~ SOLN
1800.0000 mg | Freq: Once | SUBCUTANEOUS | Status: AC
  Administered 2021-12-18: 1800 mg via SUBCUTANEOUS
  Filled 2021-12-18: qty 15

## 2021-12-18 MED ORDER — DIPHENHYDRAMINE HCL 25 MG PO CAPS
50.0000 mg | ORAL_CAPSULE | Freq: Once | ORAL | Status: AC
  Administered 2021-12-18: 50 mg via ORAL
  Filled 2021-12-18: qty 2

## 2021-12-18 NOTE — Progress Notes (Unsigned)
Patient seen by Lisa Thomas NP today  Vitals are within treatment parameters.  Labs reviewed by Lisa Thomas NP and are within treatment parameters.  Per physician team, patient is ready for treatment and there are NO modifications to the treatment plan.     

## 2021-12-18 NOTE — Patient Instructions (Signed)
[place] [facility]  Discharge Instructions: Thank you for choosing [facility] to provide your oncology and hematology care.   If you have a lab appointment with the [facility], please go directly to the [facility] and check in at the registration area.   Wear comfortable clothing and clothing appropriate for easy access to any Portacath or PICC line.   We strive to give you quality time with your provider. You may need to reschedule your appointment if you arrive late (15 or more minutes).  Arriving late affects you and other patients whose appointments are after yours.  Also, if you miss three or more appointments without notifying the office, you may be dismissed from the clinic at the providers discretion.      For prescription refill requests, have your pharmacy contact our office and allow 72 hours for refills to be completed.    Today you received the following chemotherapy and/or immunotherapy agents: Dara/Evusheld  Daratumumab injection What is this medication? DARATUMUMAB (dar a toom ue mab) is a monoclonal antibody. It is used to treat multiple myeloma. This medicine may be used for other purposes; ask your health care provider or pharmacist if you have questions. COMMON BRAND NAME(S): DARZALEX What should I tell my care team before I take this medication? They need to know if you have any of these conditions: hereditary fructose intolerance infection (especially a virus infection such as chickenpox, herpes, or hepatitis B virus) lung or breathing disease (asthma, COPD) an unusual or allergic reaction to daratumumab, sorbitol, other medicines, foods, dyes, or preservatives pregnant or trying to get pregnant breast-feeding How should I use this medication? This medicine is for infusion into a vein. It is given by a health care professional in a hospital or clinic setting. Talk to your pediatrician regarding the use of this medicine in  children. Special care may be needed. Overdosage: If you think you have taken too much of this medicine contact a poison control center or emergency room at once. NOTE: This medicine is only for you. Do not share this medicine with others. What if I miss a dose? Keep appointments for follow-up doses as directed. It is important not to miss your dose. Call your doctor or health care professional if you are unable to keep an appointment. What may interact with this medication? Interactions have not been studied. This list may not describe all possible interactions. Give your health care provider a list of all the medicines, herbs, non-prescription drugs, or dietary supplements you use. Also tell them if you smoke, drink alcohol, or use illegal drugs. Some items may interact with your medicine. What should I watch for while using this medication? Your condition will be monitored carefully while you are receiving this medicine. This medicine can cause serious allergic reactions. To reduce your risk, your health care provider may give you other medicine to take before receiving this one. Be sure to follow the directions from your health care provider. This medicine can affect the results of blood tests to match your blood type. These changes can last for up to 6 months after the final dose. Your healthcare provider will do blood tests to match your blood type before you start treatment. Tell all of your healthcare providers that you are being treated with this medicine before receiving a blood transfusion. This medicine can affect the results of some tests used to determine treatment response; extra tests may be needed to evaluate response. Do not  become pregnant while taking this medicine or for 3 months after stopping it. Women should inform their health care provider if they wish to become pregnant or think they might be pregnant. There is a potential for serious side effects to an unborn child. Talk to  your health care provider for more information. Do not breast-feed an infant while taking this medicine. What side effects may I notice from receiving this medication? Side effects that you should report to your care team as soon as possible: Allergic reactions--skin rash, itching or hives, swelling of the face, lips, or tongue Blurred vision Infection--fever, chills, cough, sore throat, pain or trouble passing urine Infusion reactions--dizziness, fast heartbeat, feeling faint or lightheaded, falls, headache, increase in blood pressure, nausea, vomiting, or wheezing or trouble breathing with loud or whistling sounds Unusual bleeding or bruising Side effects that usually do not require medical attention (report to your care team if they continue or are bothersome): Constipation Diarrhea Pain, tingling, numbness in the hands or feet Swelling of the ankles, feet, hands Tiredness This list may not describe all possible side effects. Call your doctor for medical advice about side effects. You may report side effects to FDA at 1-800-FDA-1088. Where should I keep my medication? This drug is given in a hospital or clinic and will not be stored at home. NOTE: This sheet is a summary. It may not cover all possible information. If you have questions about this medicine, talk to your doctor, pharmacist, or health care provider.  2022 Elsevier/Gold Standard (2021-04-27 00:00:00)   Zoledronic Acid Injection (Hypercalcemia, Oncology) What is this medication? ZOLEDRONIC ACID (ZOE le dron ik AS id) slows calcium loss from bones. It high calcium levels in the blood from some kinds of cancer. It may be used in other people at risk for bone loss. This medicine may be used for other purposes; ask your health care provider or pharmacist if you have questions. COMMON BRAND NAME(S): Zometa What should I tell my care team before I take this medication? They need to know if you have any of these  conditions: cancer dehydration dental disease kidney disease liver disease low levels of calcium in the blood lung or breathing disease (asthma) receiving steroids like dexamethasone or prednisone an unusual or allergic reaction to zoledronic acid, other medicines, foods, dyes, or preservatives pregnant or trying to get pregnant breast-feeding How should I use this medication? This drug is injected into a vein. It is given by a health care provider in a hospital or clinic setting. Talk to your health care provider about the use of this drug in children. Special care may be needed. Overdosage: If you think you have taken too much of this medicine contact a poison control center or emergency room at once. NOTE: This medicine is only for you. Do not share this medicine with others. What if I miss a dose? Keep appointments for follow-up doses. It is important not to miss your dose. Call your health care provider if you are unable to keep an appointment. What may interact with this medication? certain antibiotics given by injection NSAIDs, medicines for pain and inflammation, like ibuprofen or naproxen some diuretics like bumetanide, furosemide teriparatide thalidomide This list may not describe all possible interactions. Give your health care provider a list of all the medicines, herbs, non-prescription drugs, or dietary supplements you use. Also tell them if you smoke, drink alcohol, or use illegal drugs. Some items may interact with your medicine. What should I watch for while using  this medication? Visit your health care provider for regular checks on your progress. It may be some time before you see the benefit from this drug. Some people who take this drug have severe bone, joint, or muscle pain. This drug may also increase your risk for jaw problems or a broken thigh bone. Tell your health care provider right away if you have severe pain in your jaw, bones, joints, or muscles. Tell you  health care provider if you have any pain that does not go away or that gets worse. Tell your dentist and dental surgeon that you are taking this drug. You should not have major dental surgery while on this drug. See your dentist to have a dental exam and fix any dental problems before starting this drug. Take good care of your teeth while on this drug. Make sure you see your dentist for regular follow-up appointments. You should make sure you get enough calcium and vitamin D while you are taking this drug. Discuss the foods you eat and the vitamins you take with your health care provider. Check with your health care provider if you have severe diarrhea, nausea, and vomiting, or if you sweat a lot. The loss of too much body fluid may make it dangerous for you to take this drug. You may need blood work done while you are taking this drug. Do not become pregnant while taking this drug. Women should inform their health care provider if they wish to become pregnant or think they might be pregnant. There is potential for serious harm to an unborn child. Talk to your health care provider for more information. What side effects may I notice from receiving this medication? Side effects that you should report to your doctor or health care provider as soon as possible: allergic reactions (skin rash, itching or hives; swelling of the face, lips, or tongue) bone pain infection (fever, chills, cough, sore throat, pain or trouble passing urine) jaw pain, especially after dental work joint pain kidney injury (trouble passing urine or change in the amount of urine) low blood pressure (dizziness; feeling faint or lightheaded, falls; unusually weak or tired) low calcium levels (fast heartbeat; muscle cramps or pain; pain, tingling, or numbness in the hands or feet; seizures) low magnesium levels (fast, irregular heartbeat; muscle cramp or pain; muscle weakness; tremors; seizures) low red blood cell counts (trouble  breathing; feeling faint; lightheaded, falls; unusually weak or tired) muscle pain redness, blistering, peeling, or loosening of the skin, including inside the mouth severe diarrhea swelling of the ankles, feet, hands trouble breathing Side effects that usually do not require medical attention (report to your doctor or health care provider if they continue or are bothersome): anxious constipation coughing depressed mood eye irritation, itching, or pain fever general ill feeling or flu-like symptoms nausea pain, redness, or irritation at site where injected trouble sleeping This list may not describe all possible side effects. Call your doctor for medical advice about side effects. You may report side effects to FDA at 1-800-FDA-1088. Where should I keep my medication? This drug is given in a hospital or clinic. It will not be stored at home. NOTE: This sheet is a summary. It may not cover all possible information. If you have questions about this medicine, talk to your doctor, pharmacist, or health care provider.  2022 Elsevier/Gold Standard (2021-08-08 00:00:00)

## 2021-12-18 NOTE — Progress Notes (Signed)
[place] OFFICE PROGRESS NOTE   Diagnosis: Multiple myeloma  INTERVAL HISTORY:   Steve Carter returns as scheduled.  He continues monthly daratumumab.  No nausea or vomiting.  No diarrhea or constipation.  He continues oxycodone at bedtime for neuropathy pain.  He recently began Lyrica in place of gabapentin.  His wife reports his tremor has worsened.  Steve Carter complains of pain at the low back with sitting.  He reports a recent fall, landing on a concrete floor.  Objective:  Vital signs in last 24 hours:  Blood pressure 125/84, pulse 72, temperature 97.8 F (36.6 C), temperature source Oral, resp. rate 18, height 6' 2" (1.88 m), weight 269 lb 12.8 oz (122.4 kg), SpO2 98 %.    HEENT: No thrush or ulcers. Resp: Lungs clear bilaterally. Cardio: Regular rate and rhythm. GI: Abdomen soft and nontender.  No hepatomegaly. Vascular: No leg edema. Musculoskeletal: Nontender at the low back/sacrum. Skin: No rash.   Lab Results:  Lab Results  Component Value Date   WBC 8.8 12/18/2021   HGB 15.3 12/18/2021   HCT 46.0 12/18/2021   MCV 92.6 12/18/2021   PLT 244 12/18/2021   NEUTROABS 7.0 12/18/2021    Imaging:  No results found.  Medications: I have reviewed the patient's current medications.  Assessment/Plan: Multiple myeloma- IgG lambda serum monoclonal protein, elevated serum lambda light chains Bone survey 10/11/2017-lytic lesions noted in the skull, right iliac, left second rib, and mottled appearance of the proximal femurs/pelvis Bone marrow biopsy 10/14/2017-hypercellular marrow with plasma cell neoplasm, 82% plasma cells, lambda light chain restricted, hyperdiploid with gains of chromosomes 3, 5, 7, 9, and 11.  FISH panel positive for gain of ATM (+11) Cycle 1 RVD 10/18/2017 (Revlimid started 10/23/2017) Cycle 2 RVD 11/15/2017 Cycle 3 RVD 12/13/2017 Cycle 4 RVD 01/14/2018 Cycle 5 of RVD 02/14/2018 (Revlimid given for 7 days and 1 week of Velcade),  therapy held beginning 02/21/2018 secondary to plan for stem cell therapy Bone marrow biopsy 02/20/2018, 1-2% plasma cell PET scan 02/20/2018, no malignant range activity above background, numerous lytic lesions throughout the axial and appendicular skeleton,, left iliac wing fracture Melphalan, 200 mg/m on 03/24/2018 Stem cell infusion 03/25/2018 Restaging at [facility] 07/09/2018: Normal lambda light chains, no serum M spike, bone marrow biopsy negative for myeloma, less than 1% plasma cells PET scan at [facility] 07/09/2018- lytic bone lesions, no malignant range activity Initiation of maintenance Revlimid, 10 mg, 21/28 days 08/15/2018 Cycle 2 maintenance Revlimid 09/12/2018 Cycle 3 maintenance Revlimid 10/10/2018 Revlimid placed on hold 06/15/2019 due to presyncopal/syncopal episodes and diarrhea Revlimid resumed 5 mg 21 days on/7 days off following office visit 06/29/2019 Revlimid placed on hold 07/14/2019 Revlimid resumed 08/21/2019 Bone survey 09/09/2019- no acute findings or clear explanation for right buttock pain.  Lucent lesions in the right pelvis are stable without pathologic fracture.  Evidence of healing lytic lesions in the right scapula, cervical spine spinous processes and left L4 transverse process.  Possible mild progression of lytic lesions within the L1 and L4 vertebral bodies.  Stable lytic lesions in the calvarium. PET scan 09/25/2019-new FDG avid bone lesions in the right humeral neck and sacrum similar remaining lytic lesions with FDG activity below background Bone marrow biopsy at [facility] on 10/01/2019-5% plasma cells on the bone marrow biopsy suboptimal sample  Cycle 1 daratumumab, pomalidomide, Decadron 10/19/2019 Zometa 10/19/2019 Cycle 2 daratumumab, pomalidomide, Decadron 11/16/2019 (he will begin the pomalidomide 11/17/2019) Cycle 3 daratumumab, pomalidomide, and Decadron 12/14/2019 Cycle  4 daratumumab, pomalidomide, and Decadron 01/11/2020 Cycle 5 daratumumab,  pomalidomide, Decadron 02/03/2020 (he began pomalidomide on 02/06/2020) Cycle 6 daratumumab, pomalidomide, Decadron 03/02/2020 (pomalidomide starting 03/12/2020) Cycle 7 daratumumab, pomalidomide, Decadron 03/29/2020 (pomalidomide scheduled to start 04/15/2020) Cycle 8 daratumumab, pomalidomide, Decadron 05/04/2020 (pomalidomide started 05/14/2020) Cycle 9 daratumumab-monthly 06/15/2020, pomalidomide held Cycle 10 daratumumab 07/13/2020, pomalidomide 2 mg 21 days beginning 07/15/2020 Cycle 11 daratumumab 08/10/2020, pomalidomide 2 mg 21 days beginning 08/12/2020 Bone marrow biopsy 09/06/2020-hypocellular bone marrow with a relative erythroid hyperplasia and no increase in plasma cells; MRD 0.0050% Cycle 12 daratumumab 09/07/2020, pomalidomide 2 mg 21 days beginning 09/09/2020 Cycle 13 daratumumab 10/11/2020, pomalidomide 2 mg 21 days beginning 10/10/2020 Cycle 14 daratumumab 11/08/2020, pomalidomide reduced to 1 mg 21 days Cycle 15 daratumumab 12/14/2020, Pomalidomide 1 mg 21 days beginning 12/13/2020 Cycle 16 daratumumab 01/12/2021, pomalidomide 1 mg 21 days beginning 01/11/2021 Cycle 17 daratumumab 02/10/2021, pomalidomide 1 mg 21 days beginning 02/09/2021 Cycle 18 daratumumab 03/10/2021, pomalidomide 1 mg 21 days beginning 03/11/2021 Cycle 19 daratumumab 04/07/2021, Pomalidomide 1 mg 21 days beginning 04/08/2021 Cycle 20 daratumumab 05/09/2021, Pomalidomide 1 mg 21 days beginning 05/12/2021 Cycle 21 daratumumab 06/07/2021, pomalidomide held secondary to progressive neuropathy and diarrhea Cycle 22 daratumumab 07/05/2021, pomalidomide held secondary to neuropathy Cycle 23 daratumumab 08/02/2021, Pomalidomide remains on hold Cycle 24 daratumumab 08/30/2021, pomalidomide on hold Cycle 25 daratumumab 09/27/2021, pomalidomide on hold Cycle 26 daratumumab 10/23/2021, pomalidomide on hold Cycle 27 daratumumab 11/20/2021, pomalidomide on hold Cycle 28 daratumumab 12/18/2021, Pomalidomide on hold   2.  Pain secondary to multiple myeloma  involving the spine and pelvis-resolved MRI of the lumbar spine 10/02/2018- numerous rounded foci in the vertebral bodies, hypertrophy of the L4 transverse process   3.  Hypertension-losartan dose increased 11/15/2017   4.  Depression-improved with Wellbutrin beginning September 2020   5.  Altered mental status-improved depression related? 6. Diabetes 7.  Recurrent episodes of fall/syncope-etiology unclear-evaluated [facility]y at [facility], MRI brain 01/25/2020 with abnormal signal at the left superior frontal gyrus-potentially representing a low-grade glioma, scattered foci of increased T2 white matter signal-likely related to chronic small vessel disease Stereotactic brain biopsy at [facility] 03/14/2020-gliosis, no evidence of malignancy   8.  Peripheral neuropathy-gabapentin started 07/07/2020; reports increase in symptoms 06/07/2021-pomalidomide placed on hold; reports increase in symptoms 08/02/2021   9.  Renal insufficiency    Disposition: Steve Carter appears unchanged.  Plan to proceed with daratumumab today as scheduled.  We will follow-up on the myeloma panel.  CBC and chemistry panel reviewed.  Labs adequate to proceed as above.  He will contact his neurologist regarding worsening of the tremor.  Pain at the low back may be related to a recent fall.  He has an upcoming appointment with orthopedics, reports x-rays are planned.    He will return for lab, follow-up, daratumumab in 4 weeks.  We are available to see him sooner if needed.      [name] ANP/GNP-BC   12/18/2021  8:53 AM

## 2021-12-18 NOTE — Progress Notes (Signed)
Patient presents for treatment. RN assessment completed along with the following:  Labs/vitals reviewed - Yes, and within treatment parameters.   Weight within 10% of previous measurement - Yes Oncology Treatment Attestation completed for current therapy- Yes, on date 08/30/21 Informed consent completed and reflects current therapy/intent - Yes, on date 11/08/20             Provider progress note reviewed - Patient not seen by provider today. Most recent note dated 11/20/21 reviewed. Treatment/Antibody/Supportive plan reviewed - Yes, and there are no adjustments needed for today's treatment. S&H and other orders reviewed - Yes, and there are no additional orders identified. Previous treatment date reviewed - Yes, and the appropriate amount of time has elapsed between treatments. Clinic Hand Off Received from - [name], NP  Patient to proceed with treatment.

## 2021-12-19 LAB — PROTEIN ELECTROPHORESIS, SERUM
A/G Ratio: 1.3 (ref 0.7–1.7)
Albumin ELP: 3.7 g/dL (ref 2.9–4.4)
Alpha-1-Globulin: 0.2 g/dL (ref 0.0–0.4)
Alpha-2-Globulin: 1.1 g/dL — ABNORMAL HIGH (ref 0.4–1.0)
Beta Globulin: 0.8 g/dL (ref 0.7–1.3)
Gamma Globulin: 0.6 g/dL (ref 0.4–1.8)
Globulin, Total: 2.8 g/dL (ref 2.2–3.9)
M-Spike, %: 0.3 g/dL — ABNORMAL HIGH
Total Protein ELP: 6.5 g/dL (ref 6.0–8.5)

## 2021-12-19 LAB — KAPPA/LAMBDA LIGHT CHAINS
Kappa free light chain: 6.9 mg/L (ref 3.3–19.4)
Kappa, lambda light chain ratio: 0.11 — ABNORMAL LOW (ref 0.26–1.65)
Lambda free light chains: 61.2 mg/L — ABNORMAL HIGH (ref 5.7–26.3)

## 2021-12-21 ENCOUNTER — Telehealth: Payer: Self-pay

## 2021-12-21 ENCOUNTER — Encounter: Payer: Self-pay | Admitting: Nurse Practitioner

## 2021-12-21 NOTE — Telephone Encounter (Signed)
-----   Message from Ladell Pier, MD sent at 12/20/2021  6:33 AM EST ----- Please call patient, light chains and serum m-spike mildly elevated, repeat next visit, continue daratumumab for now, will consider change in treatment for consistent rise

## 2021-12-21 NOTE — Telephone Encounter (Signed)
TC to Pt spoke with Pt's wife relayed message to Pt's wife will relay message to Pt.

## 2021-12-25 ENCOUNTER — Telehealth: Payer: Self-pay | Admitting: Adult Health

## 2021-12-25 ENCOUNTER — Other Ambulatory Visit: Payer: Self-pay

## 2021-12-25 DIAGNOSIS — F331 Major depressive disorder, recurrent, moderate: Secondary | ICD-10-CM

## 2021-12-25 DIAGNOSIS — F411 Generalized anxiety disorder: Secondary | ICD-10-CM

## 2021-12-25 MED ORDER — BUPROPION HCL ER (XL) 150 MG PO TB24: 450.0000 mg | ORAL_TABLET | Freq: Every morning | ORAL | 0 refills | Status: DC

## 2021-12-25 NOTE — Telephone Encounter (Signed)
Rx sent 

## 2021-12-25 NOTE — Telephone Encounter (Signed)
Pharmacy left a message that the bupriopion xl 150 mg needs to be sent into them. His next appt is 2/13

## 2021-12-26 ENCOUNTER — Telehealth: Payer: Self-pay | Admitting: Nurse Practitioner

## 2021-12-26 ENCOUNTER — Encounter: Payer: Self-pay | Admitting: Oncology

## 2021-12-26 NOTE — Telephone Encounter (Signed)
I contacted Ms. Steve Carter regarding the message she sent through [employer].  [name] was seen by Dr. Redmond Pulling, orthopedics at [facility] earlier today.  X-rays were obtained showing "myeloma" at L1.  The x-ray report is not posted in care everywhere.  We will follow-up once the final report is available.

## 2021-12-27 ENCOUNTER — Telehealth: Payer: Self-pay

## 2021-12-27 NOTE — Telephone Encounter (Signed)
Called and spoke with the patient's wife to let her know I requested the x-ray report. Kaitlyn in medicine record  stated they have  up to 72 hours to turn around. Patient's wife voiced understanding

## 2021-12-27 NOTE — Telephone Encounter (Signed)
Faxed [phone] over a request for a result of the patient X-ray to medicine record.

## 2021-12-27 NOTE — Progress Notes (Signed)
..  Patient is receiving Assistance Medication - Supplied Externally. Medication: Darzalex FasPro Manufacture: [employer] Approval Dates: Approved from 12/03/2021 until 12/02/2022. ID:   Reason: EOOP First DOS: 12/18/2021.   Marland KitchenJuan Quam, CPhT IV Drug Replacement Specialist [place] Phone: [phone]

## 2021-12-27 NOTE — Telephone Encounter (Signed)
[facility] left a message request a result report of the  X-ray to be fax at [phone].

## 2021-12-28 ENCOUNTER — Ambulatory Visit (HOSPITAL_BASED_OUTPATIENT_CLINIC_OR_DEPARTMENT_OTHER): Payer: Medicare PPO

## 2021-12-28 ENCOUNTER — Encounter: Payer: Self-pay | Admitting: Oncology

## 2021-12-28 ENCOUNTER — Other Ambulatory Visit: Payer: Self-pay | Admitting: Nurse Practitioner

## 2021-12-28 DIAGNOSIS — C9000 Multiple myeloma not having achieved remission: Secondary | ICD-10-CM

## 2021-12-29 ENCOUNTER — Ambulatory Visit (HOSPITAL_BASED_OUTPATIENT_CLINIC_OR_DEPARTMENT_OTHER)
Admission: RE | Admit: 2021-12-29 | Discharge: 2021-12-29 | Disposition: A | Discharge status: Home / Self Care | Payer: Medicare PPO | Source: Ambulatory Visit | Attending: Nurse Practitioner | Admitting: Nurse Practitioner

## 2021-12-29 ENCOUNTER — Other Ambulatory Visit: Payer: Self-pay

## 2021-12-29 ENCOUNTER — Encounter (HOSPITAL_BASED_OUTPATIENT_CLINIC_OR_DEPARTMENT_OTHER): Payer: Self-pay

## 2021-12-29 DIAGNOSIS — R9341 Abnormal radiologic findings on diagnostic imaging of renal pelvis, ureter, or bladder: Secondary | ICD-10-CM | POA: Insufficient documentation

## 2021-12-29 DIAGNOSIS — M8580 Other specified disorders of bone density and structure, unspecified site: Secondary | ICD-10-CM | POA: Diagnosis not present

## 2021-12-29 DIAGNOSIS — M545 Low back pain, unspecified: Secondary | ICD-10-CM | POA: Diagnosis not present

## 2021-12-29 DIAGNOSIS — C9000 Multiple myeloma not having achieved remission: Secondary | ICD-10-CM

## 2021-12-29 DIAGNOSIS — M4316 Spondylolisthesis, lumbar region: Secondary | ICD-10-CM | POA: Diagnosis not present

## 2021-12-29 DIAGNOSIS — W19XXXA Unspecified fall, initial encounter: Secondary | ICD-10-CM | POA: Insufficient documentation

## 2021-12-29 DIAGNOSIS — S3992XD Unspecified injury of lower back, subsequent encounter: Secondary | ICD-10-CM | POA: Diagnosis not present

## 2021-12-29 DIAGNOSIS — W19XXXD Unspecified fall, subsequent encounter: Secondary | ICD-10-CM | POA: Diagnosis not present

## 2022-01-01 ENCOUNTER — Encounter: Payer: Self-pay | Admitting: Oncology

## 2022-01-02 ENCOUNTER — Encounter: Payer: Self-pay | Admitting: Nurse Practitioner

## 2022-01-03 ENCOUNTER — Other Ambulatory Visit: Payer: Self-pay | Admitting: Nurse Practitioner

## 2022-01-03 DIAGNOSIS — C9000 Multiple myeloma not having achieved remission: Secondary | ICD-10-CM

## 2022-01-04 ENCOUNTER — Encounter: Payer: Self-pay | Admitting: Nurse Practitioner

## 2022-01-04 ENCOUNTER — Telehealth: Payer: Self-pay | Admitting: *Deleted

## 2022-01-04 ENCOUNTER — Other Ambulatory Visit: Payer: Self-pay | Admitting: Nurse Practitioner

## 2022-01-04 DIAGNOSIS — C9000 Multiple myeloma not having achieved remission: Secondary | ICD-10-CM

## 2022-01-04 MED ORDER — LORAZEPAM 0.5 MG PO TABS: ORAL_TABLET | ORAL | 0 refills | Status: DC

## 2022-01-04 NOTE — Telephone Encounter (Addendum)
Call from review nurse w/case #[id number]. PET scan ordered as PSMA, which is usually for prostate cancer. Need diagnosis code for the scan as well as last two office notes and labs and reason for PET. Per Steve Carter: Dx: Myeloma--C90.00 Lower back pain that CT shows myeloma lesion. PET is to determine if it is an active lesion. Fax [phone] Last two office notes, labs and recent CT reports faxed to [facility] as above.

## 2022-01-06 ENCOUNTER — Encounter: Payer: Self-pay | Admitting: Oncology

## 2022-01-08 ENCOUNTER — Encounter: Payer: Self-pay | Admitting: Oncology

## 2022-01-10 ENCOUNTER — Other Ambulatory Visit: Payer: Self-pay | Admitting: Nurse Practitioner

## 2022-01-10 ENCOUNTER — Encounter: Payer: Self-pay | Admitting: Nurse Practitioner

## 2022-01-10 DIAGNOSIS — C9000 Multiple myeloma not having achieved remission: Secondary | ICD-10-CM

## 2022-01-10 MED ORDER — OXYCODONE-ACETAMINOPHEN 5-325 MG PO TABS: 1.0000 | ORAL_TABLET | Freq: Three times a day (TID) | ORAL | 0 refills | Status: DC | PRN

## 2022-01-11 ENCOUNTER — Ambulatory Visit (HOSPITAL_COMMUNITY)
Admission: RE | Admit: 2022-01-11 | Discharge: 2022-01-11 | Disposition: A | Discharge status: Home / Self Care | Payer: Medicare PPO | Source: Ambulatory Visit | Attending: Nurse Practitioner | Admitting: Nurse Practitioner

## 2022-01-11 ENCOUNTER — Other Ambulatory Visit: Payer: Self-pay

## 2022-01-11 DIAGNOSIS — C9000 Multiple myeloma not having achieved remission: Secondary | ICD-10-CM | POA: Diagnosis present

## 2022-01-11 MED ORDER — FLUDEOXYGLUCOSE F - 18 (FDG) INJECTION
13.8300 | Freq: Once | INTRAVENOUS | Status: AC | PRN
  Administered 2022-01-11: 13.83 via INTRAVENOUS

## 2022-01-15 ENCOUNTER — Inpatient Hospital Stay: Payer: Medicare PPO | Attending: Nurse Practitioner | Admitting: Oncology

## 2022-01-15 ENCOUNTER — Encounter: Payer: Self-pay | Admitting: Adult Health

## 2022-01-15 ENCOUNTER — Other Ambulatory Visit: Payer: Self-pay

## 2022-01-15 ENCOUNTER — Inpatient Hospital Stay: Payer: Medicare PPO

## 2022-01-15 ENCOUNTER — Telehealth (INDEPENDENT_AMBULATORY_CARE_PROVIDER_SITE_OTHER): Payer: Medicare PPO | Admitting: Adult Health

## 2022-01-15 VITALS — BP 123/74 | HR 76 | Temp 97.7°F | Resp 18 | Ht 74.0 in | Wt 272.0 lb

## 2022-01-15 DIAGNOSIS — N289 Disorder of kidney and ureter, unspecified: Secondary | ICD-10-CM | POA: Insufficient documentation

## 2022-01-15 DIAGNOSIS — F32A Depression, unspecified: Secondary | ICD-10-CM | POA: Diagnosis not present

## 2022-01-15 DIAGNOSIS — Z5112 Encounter for antineoplastic immunotherapy: Secondary | ICD-10-CM | POA: Diagnosis present

## 2022-01-15 DIAGNOSIS — Z79899 Other long term (current) drug therapy: Secondary | ICD-10-CM | POA: Insufficient documentation

## 2022-01-15 DIAGNOSIS — F331 Major depressive disorder, recurrent, moderate: Secondary | ICD-10-CM

## 2022-01-15 DIAGNOSIS — Z5111 Encounter for antineoplastic chemotherapy: Secondary | ICD-10-CM | POA: Insufficient documentation

## 2022-01-15 DIAGNOSIS — G4700 Insomnia, unspecified: Secondary | ICD-10-CM | POA: Diagnosis not present

## 2022-01-15 DIAGNOSIS — C9000 Multiple myeloma not having achieved remission: Secondary | ICD-10-CM | POA: Diagnosis present

## 2022-01-15 DIAGNOSIS — G893 Neoplasm related pain (acute) (chronic): Secondary | ICD-10-CM | POA: Insufficient documentation

## 2022-01-15 DIAGNOSIS — F411 Generalized anxiety disorder: Secondary | ICD-10-CM

## 2022-01-15 DIAGNOSIS — E119 Type 2 diabetes mellitus without complications: Secondary | ICD-10-CM | POA: Insufficient documentation

## 2022-01-15 DIAGNOSIS — G629 Polyneuropathy, unspecified: Secondary | ICD-10-CM | POA: Insufficient documentation

## 2022-01-15 DIAGNOSIS — I10 Essential (primary) hypertension: Secondary | ICD-10-CM | POA: Diagnosis not present

## 2022-01-15 LAB — CBC WITH DIFFERENTIAL (CANCER CENTER ONLY)
Abs Immature Granulocytes: 0.08 10*3/uL — ABNORMAL HIGH (ref 0.00–0.07)
Basophils Absolute: 0 10*3/uL (ref 0.0–0.1)
Basophils Relative: 0 %
Eosinophils Absolute: 0.1 10*3/uL (ref 0.0–0.5)
Eosinophils Relative: 1 %
HCT: 47.3 % (ref 39.0–52.0)
Hemoglobin: 15.9 g/dL (ref 13.0–17.0)
Immature Granulocytes: 1 %
Lymphocytes Relative: 9 %
Lymphs Abs: 0.9 10*3/uL (ref 0.7–4.0)
MCH: 29.8 pg (ref 26.0–34.0)
MCHC: 33.6 g/dL (ref 30.0–36.0)
MCV: 88.7 fL (ref 80.0–100.0)
Monocytes Absolute: 0.7 10*3/uL (ref 0.1–1.0)
Monocytes Relative: 7 %
Neutro Abs: 7.9 10*3/uL — ABNORMAL HIGH (ref 1.7–7.7)
Neutrophils Relative %: 82 %
Platelet Count: 220 10*3/uL (ref 150–400)
RBC: 5.33 MIL/uL (ref 4.22–5.81)
RDW: 13.8 % (ref 11.5–15.5)
WBC Count: 9.7 10*3/uL (ref 4.0–10.5)
nRBC: 0 % (ref 0.0–0.2)

## 2022-01-15 LAB — CMP (CANCER CENTER ONLY)
ALT: 17 U/L (ref 0–44)
AST: 18 U/L (ref 15–41)
Albumin: 4.2 g/dL (ref 3.5–5.0)
Alkaline Phosphatase: 55 U/L (ref 38–126)
Anion gap: 12 (ref 5–15)
BUN: 19 mg/dL (ref 6–20)
CO2: 25 mmol/L (ref 22–32)
Calcium: 9.1 mg/dL (ref 8.9–10.3)
Chloride: 95 mmol/L — ABNORMAL LOW (ref 98–111)
Creatinine: 1.38 mg/dL — ABNORMAL HIGH (ref 0.61–1.24)
GFR, Estimated: >60 mL/min (ref >60)
Glucose, Bld: 224 mg/dL — ABNORMAL HIGH (ref 70–99)
Potassium: 3.9 mmol/L (ref 3.5–5.1)
Sodium: 132 mmol/L — ABNORMAL LOW (ref 135–145)
Total Bilirubin: 0.3 mg/dL (ref 0.3–1.2)
Total Protein: 6.8 g/dL (ref 6.5–8.1)

## 2022-01-15 MED ORDER — BUPROPION HCL ER (XL) 150 MG PO TB24: 450.0000 mg | ORAL_TABLET | Freq: Every morning | ORAL | 3 refills | Status: DC

## 2022-01-15 MED ORDER — ESCITALOPRAM OXALATE 20 MG PO TABS: 20.0000 mg | ORAL_TABLET | Freq: Every day | ORAL | 3 refills | Status: AC

## 2022-01-15 MED ORDER — ARIPIPRAZOLE 15 MG PO TABS: 15.0000 mg | ORAL_TABLET | Freq: Every day | ORAL | 3 refills | Status: DC

## 2022-01-15 MED ORDER — TRAZODONE HCL 50 MG PO TABS: 50.0000 mg | ORAL_TABLET | Freq: Every day | ORAL | 3 refills | Status: DC

## 2022-01-15 NOTE — Progress Notes (Deleted)
[name] [id number] 13-Nov-1966 56 y.o.  Subjective:   Patient ID:  Steve Carter is a 56 y.o. (DOB [birth year]) male.  Chief Complaint: No chief complaint on file.   HPI Steve Carter presents to the office today for follow-up of depression, anxiety, insomnia, and irritability   Describes mood today as "ok". Pleasant. Mood symptoms - denies depression, anxiety, and irritability. Mood is stable. Stating "I'm doing alright". Feels like medications are working well. Taking current medications as prescribed. Energy levels low. Active, unable to establish a regular exercise routine with physical disabilities. Enjoys some usual interests and activities. Spending time with family wife - and 2 children. Eating breakfast with friends. [place]. Appetite adequate. Weight loss - 262 from 276 pounds. Sleeping difficulties - up and down during the nights. Napping during the day.  Focus and concentration difficulties. Completing tasks. Managing aspects of household. Out of work for the past 3 years. Denies SI or HI.  Denies AH or VH.    Review of Systems:  Review of Systems  Musculoskeletal:  Negative for gait problem.  Neurological:  Negative for tremors.  Psychiatric/Behavioral:         Please refer to HPI   Medications: I have reviewed the patient's current medications.  Current Outpatient Medications  Medication Sig Dispense Refill   ARIPiprazole (ABILIFY) 15 MG tablet Take 1 tablet (15 mg total) by mouth daily. 90 tablet 3   aspirin EC 81 MG tablet Take 1 tablet (81 mg total) daily by mouth.     buPROPion (WELLBUTRIN XL) 150 MG 24 hr tablet Take 3 tablets (450 mg total) by mouth every morning. 270 tablet 0   BYSTOLIC 10 MG tablet Take 10 mg by mouth 2 (two) times daily.     dexamethasone (DECADRON) 4 MG tablet Take 20 mg (#5 tab) by mouth on day after each chemotherapy treatment 20 tablet 1   DULoxetine (CYMBALTA) 60 MG capsule Take by mouth.      escitalopram (LEXAPRO) 20 MG tablet Take 20 mg daily by mouth.     hydrochlorothiazide (HYDRODIURIL) 25 MG tablet Take 25 mg by mouth daily.     insulin glargine (LANTUS) 100 UNIT/ML injection Inject into the skin daily as needed. Based on glucose reading at bedtime-Hold if 150 or lower     LORazepam (ATIVAN) 0.5 MG tablet Take 1 tab 30 minutes to 1 hour prior to PET scan; may repeat x 1 2 tablet 0   losartan (COZAAR) 100 MG tablet TAKE 1 TABLET BY MOUTH EVERY DAY 30 tablet 0   Omeprazole Magnesium (PRILOSEC OTC PO) Take 20 mg daily by mouth.     oxyCODONE-acetaminophen (PERCOCET/ROXICET) 5-325 MG tablet Take 1 tablet by mouth every 8 (eight) hours as needed for severe pain. 60 tablet 0   pomalidomide (POMALYST) 1 MG capsule TAKE 1 CAPSULE BY MOUTH  DAILY FOR 21 DAYS, THEN 7  DAYS OFF (Patient not taking: Reported on 08/30/2021) 21 capsule 0   pregabalin (LYRICA) 300 MG capsule Take by mouth.     primidone (MYSOLINE) 250 MG tablet Take by mouth.     primidone (MYSOLINE) 50 MG tablet Take 100 mg by mouth at bedtime. Will increase to 100 mg in 1 month     testosterone cypionate (DEPOTESTOTERONE CYPIONATE) 100 MG/ML injection Inject 200 mg into the muscle every 14 (fourteen) days. For IM use only     No current facility-administered medications for this visit.    Medication Side Effects: None  Allergies: No  Known Allergies  Past Medical History:  Diagnosis Date   Anxiety    Depression    GERD (gastroesophageal reflux disease)    Hypertension    Sleep apnea    uses C-Pap    Past Medical History, Surgical history, Social history, and Family history were reviewed and updated as appropriate.   Please see review of systems for further details on the patient's review from today.   Objective:   Physical Exam:  There were no vitals taken for this visit.  Physical Exam Constitutional:      General: He is not in acute distress. Musculoskeletal:        General: No deformity.   Neurological:     Mental Status: He is alert and oriented to person, place, and time.     Coordination: Coordination normal.  Psychiatric:        Attention and Perception: Attention and perception normal. He does not perceive auditory or visual hallucinations.        Mood and Affect: Mood normal. Mood is not anxious or depressed. Affect is not labile, blunt, angry or inappropriate.        Speech: Speech normal.        Behavior: Behavior normal.        Thought Content: Thought content normal. Thought content is not paranoid or delusional. Thought content does not include homicidal or suicidal ideation. Thought content does not include homicidal or suicidal plan.        Cognition and Memory: Cognition and memory normal.        Judgment: Judgment normal.     Comments: Insight intact    Lab Review:     Component Value Date/Time   NA 132 (L) 01/15/2022 0903   NA 136 11/29/2017 0935   K 3.9 01/15/2022 0903   K 3.5 11/29/2017 0935   CL 95 (L) 01/15/2022 0903   CO2 25 01/15/2022 0903   CO2 22 11/29/2017 0935   GLUCOSE 224 (H) 01/15/2022 0903   GLUCOSE 232 (H) 11/29/2017 0935   BUN 19 01/15/2022 0903   BUN 11.3 11/29/2017 0935   CREATININE 1.38 (H) 01/15/2022 0903   CREATININE 0.9 11/29/2017 0935   CALCIUM 9.1 01/15/2022 0903   CALCIUM 8.5 11/29/2017 0935   PROT 6.8 01/15/2022 0903   PROT 8.2 11/15/2017 0810   PROT 8.5 (H) 11/15/2017 0810   ALBUMIN 4.2 01/15/2022 0903   ALBUMIN 3.3 (L) 11/15/2017 0810   AST 18 01/15/2022 0903   AST 20 11/15/2017 0810   ALT 17 01/15/2022 0903   ALT 39 11/15/2017 0810   ALKPHOS 55 01/15/2022 0903   ALKPHOS 100 11/15/2017 0810   BILITOT 0.3 01/15/2022 0903   BILITOT 0.45 11/15/2017 0810   GFRNONAA >60 01/15/2022 0903   GFRAA >60 08/10/2020 0941       Component Value Date/Time   WBC 9.7 01/15/2022 0903   WBC 8.9 01/21/2018 0820   RBC 5.33 01/15/2022 0903   HGB 15.9 01/15/2022 0903   HGB 14.8 11/29/2017 0935   HCT 47.3 01/15/2022 0903    HCT 43.4 11/29/2017 0935   PLT 220 01/15/2022 0903   PLT 265 11/29/2017 0935   MCV 88.7 01/15/2022 0903   MCV 91.4 11/29/2017 0935   MCH 29.8 01/15/2022 0903   MCHC 33.6 01/15/2022 0903   RDW 13.8 01/15/2022 0903   RDW 14.7 (H) 11/29/2017 0935   LYMPHSABS 0.9 01/15/2022 0903   LYMPHSABS 0.8 (L) 11/29/2017 0935   MONOABS 0.7 01/15/2022 3086  MONOABS 0.3 11/29/2017 0935   EOSABS 0.1 01/15/2022 0903   EOSABS 0.1 11/29/2017 0935   BASOSABS 0.0 01/15/2022 0903   BASOSABS 0.0 11/29/2017 0935    No results found for: POCLITH, LITHIUM   No results found for: PHENYTOIN, PHENOBARB, VALPROATE, CBMZ   .res Assessment: Plan:    Plan:  Abilify 15mg  tab daily for mood symptoms Lexapro 20mg  daily Wellbutrin XL 450mg  every morning Add Trazadone 50mg  at hs  RTC 4 weeks  Patient advised to contact office with any questions, adverse effects, or acute worsening in signs and symptoms.  Discussed potential metabolic side effects associated with atypical antipsychotics, as well as potential risk for movement side effects. Advised pt to contact office if movement side effects occur.    There are no diagnoses linked to this encounter.   Please see After Visit Summary for patient specific instructions.  Future Appointments  Date Time Provider [place]  01/15/2022  2:40 PM Lacretia Tindall, Berdie Ogren, NP [facility] None  01/25/2022  8:20 AM Ladell Pier, MD [facility] None    No orders of the defined types were placed in this encounter.   -------------------------------

## 2022-01-15 NOTE — Progress Notes (Deleted)
[place] OFFICE PROGRESS NOTE   Diagnosis:   INTERVAL HISTORY:   ***  Objective:  Vital signs in last 24 hours:  There were no vitals taken for this visit.    HEENT: *** Lymphatics: *** Resp: *** Cardio: *** GI: *** Vascular: *** Neuro:***  Skin:***   Portacath/PICC-without erythema  Lab Results:  Lab Results  Component Value Date   WBC 8.8 12/18/2021   HGB 15.3 12/18/2021   HCT 46.0 12/18/2021   MCV 92.6 12/18/2021   PLT 244 12/18/2021   NEUTROABS 7.0 12/18/2021    CMP  Lab Results  Component Value Date   NA 134 (L) 12/18/2021   K 4.3 12/18/2021   CL 97 (L) 12/18/2021   CO2 28 12/18/2021   GLUCOSE 166 (H) 12/18/2021   BUN 15 12/18/2021   CREATININE 1.25 (H) 12/18/2021   CALCIUM 9.4 12/18/2021   PROT 6.9 12/18/2021   ALBUMIN 4.4 12/18/2021   AST 18 12/18/2021   ALT 20 12/18/2021   ALKPHOS 52 12/18/2021   BILITOT 0.3 12/18/2021   GFRNONAA >60 12/18/2021   GFRAA >60 08/10/2020    No results found for: CEA1, CEA, CAN199, CA125  Lab Results  Component Value Date   INR 0.90 10/14/2017   LABPROT 12.1 10/14/2017    Imaging:  NM PET Image Restag (PS) Skull Base To Thigh  Result Date: 01/12/2022 CLINICAL DATA:  Initial treatment strategy for myeloma. EXAM: NUCLEAR MEDICINE PET SKULL BASE TO THIGH TECHNIQUE: 13.83 mCi F-18 FDG was injected intravenously. Full-ring PET imaging was performed from the skull base to thigh after the radiotracer. CT data was obtained and used for attenuation correction and anatomic localization. Fasting blood glucose: 160 mg/dl COMPARISON:  CT of the spine from January 2023 and CT of the pelvis of the same date. No additional PET imaging for comparison. FINDINGS: Mediastinal blood pool activity: SUV max 3.52 Liver activity: SUV max NA NECK: No hypermetabolic lymph nodes in the neck. Incidental CT findings: Mucous retention cyst or polyp in the RIGHT maxillary sinus. CHEST: Small RIGHT thyroid lesion (image  99/3) 12 mm with mildly increased metabolic activity though metabolic activity is similar to mediastinal blood pool. No adenopathy in the chest. No hypermetabolic lesions in the lungs. Incidental CT findings: none ABDOMEN/PELVIS: No abnormal hypermetabolic activity within the liver, pancreas, adrenal glands, or spleen. No hypermetabolic lymph nodes in the abdomen or pelvis. Incidental CT findings: Post cholecystectomy. No acute findings in the abdomen. SKELETON: Sclerotic lesion centered about the S2-S3 level shows mixed lytic and sclerotic change measuring up to 3.6 x 3.5 cm greatest axial dimension and shows markedly increased metabolic activity with a maximum SUV of 11.9. There are numerous lytic and sclerotic foci within visible areas of bone Lucent lesions in the calvarium not associated with increased metabolic activity. Scattered lytic changes, tumor numerous to count throughout the spine both thoracic and lumbar spine as well as lower cervical spine. Lytic lesions in the bilateral proximal humeral metaphysis ease extending in the humeral heads with low level FDG uptake maximum SUV on the LEFT at 3.4 for example. Lucency within the medullary space without violation of overlying cortex measuring up to 3 cm greatest axial dimension on image 108/3. RIGHT humeral lesion extending into the humeral head 3.2 x 3.2 cm (image 100/3) Scattered chronic appearing rib fractures. Expansile changes of the LEFT eighth rib posteriorly with only low level FDG uptake. Lytic lesion in the RIGHT acromion (image 99/3) maximum SUV 2.78. Scattered areas in the  scapulae with only minimal FDG uptake greatest areas sclerotic on image 90/3 with a maximum SUV of 2.79 in the RIGHT scapula. Heterogeneous uptake throughout the spine without uptake greater than mediastinal blood pool aside from the sacrum which was mention previously seen in the spine or sacrum. Sacral uptake extends into predominantly lytic changes in the mid to lower  sacrum and is contiguous with mixed lytic and sclerotic changes outlined above. Bilateral mixed lytic and sclerotic lesions involving the pelvis not substantially changed compared to very recent imaging and with signs of prior biopsy traversing the LEFT iliac crest. Metabolic activity on the RIGHT within predominantly lytic changes showing a maximum SUV of 3.9 (image 250/3) Lucent areas in the bilateral acetabuli the display a well-circumscribed margins compatible with a disease and with increased metabolic activity with a maximum SUV of 4.7. (Image 269/3) area measuring 3.5 cm on the RIGHT and 3.7 cm on the LEFT. Chronic compression fracture at the T4 level without substantial change since October of 2020. Bilateral proximal femora with lytic changes as well displaying mild-to-moderate FDG uptake, for example in the LEFT femoral neck (image 299/3) lucent lesion measuring 2.5 cm shows a maximum SUV of 3.72. Incidental CT findings: none IMPRESSION: Extensive changes of myeloma with evidence of active myeloma most notably within the sacrum with other areas of predominantly lytic change showing moderate uptake as discussed. Small RIGHT thyroid lesion with borderline uptake of FDG. Recommend thyroid US and biopsy (ref: J Am Coll Radiol. 2015 Feb;12(2): 143-50). Electronically Signed   By: Zetta Bills M.D.   On: 01/12/2022 15:49    Medications: I have reviewed the patient's current medications.   Assessment/Plan: Multiple myeloma- IgG lambda serum monoclonal protein, elevated serum lambda light chains Bone survey 10/11/2017-lytic lesions noted in the skull, right iliac, left second rib, and mottled appearance of the proximal femurs/pelvis Bone marrow biopsy 10/14/2017-hypercellular marrow with plasma cell neoplasm, 82% plasma cells, lambda light chain restricted, hyperdiploid with gains of chromosomes 3, 5, 7, 9, and 11.  FISH panel positive for gain of ATM (+11) Cycle 1 RVD 10/18/2017 (Revlimid started  10/23/2017) Cycle 2 RVD 11/15/2017 Cycle 3 RVD 12/13/2017 Cycle 4 RVD 01/14/2018 Cycle 5 of RVD 02/14/2018 (Revlimid given for 7 days and 1 week of Velcade), therapy held beginning 02/21/2018 secondary to plan for stem cell therapy Bone marrow biopsy 02/20/2018, 1-2% plasma cell PET scan 02/20/2018, no malignant range activity above background, numerous lytic lesions throughout the axial and appendicular skeleton,, left iliac wing fracture Melphalan, 200 mg/m on 03/24/2018 Stem cell infusion 03/25/2018 Restaging at [facility] 07/09/2018: Normal lambda light chains, no serum M spike, bone marrow biopsy negative for myeloma, less than 1% plasma cells PET scan at [facility] 07/09/2018- lytic bone lesions, no malignant range activity Initiation of maintenance Revlimid, 10 mg, 21/28 days 08/15/2018 Cycle 2 maintenance Revlimid 09/12/2018 Cycle 3 maintenance Revlimid 10/10/2018 Revlimid placed on hold 06/15/2019 due to presyncopal/syncopal episodes and diarrhea Revlimid resumed 5 mg 21 days on/7 days off following office visit 06/29/2019 Revlimid placed on hold 07/14/2019 Revlimid resumed 08/21/2019 Bone survey 09/09/2019- no acute findings or clear explanation for right buttock pain.  Lucent lesions in the right pelvis are stable without pathologic fracture.  Evidence of healing lytic lesions in the right scapula, cervical spine spinous processes and left L4 transverse process.  Possible mild progression of lytic lesions within the L1 and L4 vertebral bodies.  Stable lytic lesions in the calvarium. PET scan 09/25/2019-new FDG avid bone lesions in the  right humeral neck and sacrum similar remaining lytic lesions with FDG activity below background Bone marrow biopsy at [facility] on 10/01/2019-5% plasma cells on the bone marrow biopsy suboptimal sample  Cycle 1 daratumumab, pomalidomide, Decadron 10/19/2019 Zometa 10/19/2019 Cycle 2 daratumumab, pomalidomide, Decadron 11/16/2019 (he will begin the pomalidomide  11/17/2019) Cycle 3 daratumumab, pomalidomide, and Decadron 12/14/2019 Cycle 4 daratumumab, pomalidomide, and Decadron 01/11/2020 Cycle 5 daratumumab, pomalidomide, Decadron 02/03/2020 (he began pomalidomide on 02/06/2020) Cycle 6 daratumumab, pomalidomide, Decadron 03/02/2020 (pomalidomide starting 03/12/2020) Cycle 7 daratumumab, pomalidomide, Decadron 03/29/2020 (pomalidomide scheduled to start 04/15/2020) Cycle 8 daratumumab, pomalidomide, Decadron 05/04/2020 (pomalidomide started 05/14/2020) Cycle 9 daratumumab-monthly 06/15/2020, pomalidomide held Cycle 10 daratumumab 07/13/2020, pomalidomide 2 mg 21 days beginning 07/15/2020 Cycle 11 daratumumab 08/10/2020, pomalidomide 2 mg 21 days beginning 08/12/2020 Bone marrow biopsy 09/06/2020-hypocellular bone marrow with a relative erythroid hyperplasia and no increase in plasma cells; MRD 0.0050% Cycle 12 daratumumab 09/07/2020, pomalidomide 2 mg 21 days beginning 09/09/2020 Cycle 13 daratumumab 10/11/2020, pomalidomide 2 mg 21 days beginning 10/10/2020 Cycle 14 daratumumab 11/08/2020, pomalidomide reduced to 1 mg 21 days Cycle 15 daratumumab 12/14/2020, Pomalidomide 1 mg 21 days beginning 12/13/2020 Cycle 16 daratumumab 01/12/2021, pomalidomide 1 mg 21 days beginning 01/11/2021 Cycle 17 daratumumab 02/10/2021, pomalidomide 1 mg 21 days beginning 02/09/2021 Cycle 18 daratumumab 03/10/2021, pomalidomide 1 mg 21 days beginning 03/11/2021 Cycle 19 daratumumab 04/07/2021, Pomalidomide 1 mg 21 days beginning 04/08/2021 Cycle 20 daratumumab 05/09/2021, Pomalidomide 1 mg 21 days beginning 05/12/2021 Cycle 21 daratumumab 06/07/2021, pomalidomide held secondary to progressive neuropathy and diarrhea Cycle 22 daratumumab 07/05/2021, pomalidomide held secondary to neuropathy Cycle 23 daratumumab 08/02/2021, Pomalidomide remains on hold Cycle 24 daratumumab 08/30/2021, pomalidomide on hold Cycle 25 daratumumab 09/27/2021, pomalidomide on hold Cycle 26 daratumumab 10/23/2021, pomalidomide on hold Cycle  27 daratumumab 11/20/2021, pomalidomide on hold Cycle 28 daratumumab 12/18/2021, Pomalidomide on hold   2.  Pain secondary to multiple myeloma involving the spine and pelvis-resolved MRI of the lumbar spine 10/02/2018- numerous rounded foci in the vertebral bodies, hypertrophy of the L4 transverse process   3.  Hypertension-losartan dose increased 11/15/2017   4.  Depression-improved with Wellbutrin beginning September 2020   5.  Altered mental status-improved depression related? 6. Diabetes 7.  Recurrent episodes of fall/syncope-etiology unclear-evaluated by neurology at [facility], MRI brain 01/25/2020 with abnormal signal at the left superior frontal gyrus-potentially representing a low-grade glioma, scattered foci of increased T2 white matter signal-likely related to chronic small vessel disease Stereotactic brain biopsy at [facility] 03/14/2020-gliosis, no evidence of malignancy   8.  Peripheral neuropathy-gabapentin started 07/07/2020; reports increase in symptoms 06/07/2021-pomalidomide placed on hold; reports increase in symptoms 08/02/2021   9.  Renal insufficiency     Disposition: ***  Betsy Coder, MD  01/15/2022  8:48 AM

## 2022-01-15 NOTE — Progress Notes (Signed)
[place] OFFICE PROGRESS NOTE   Diagnosis: Multiple myeloma  INTERVAL HISTORY:   [name] returns as scheduled.  He continues monthly daratumumab/Decadron.  He is here today with his wife.  He has developed pain at the sacrum for the past several weeks.  A CT of the pelvis and lumbar spine on 12/29/2021 revealed unchanged lytic lesions compared to a scan from October 2020.  This included a chronic soft tissue mass at the level of the S2 and S3 canal.  He takes oxycodone 3 times per day for relief of pain.  A PET scan 01/11/2022 revealed multiple areas of active myeloma with the most hypermetabolic lesion at the sacrum.  Objective:  Vital signs in last 24 hours:  Blood pressure 123/74, pulse 76, temperature 97.7 F (36.5 C), temperature source Oral, resp. rate 18, height $RemoveBe'6\' 2"'[id number]$  (1.88 m), weight 272 lb (123.4 kg), SpO2 98 %.    Resp: Clear bilaterally Cardio: Regular rate and rhythm GI: No hepatosplenomegaly Vascular: No leg edema Neuro: The motor exam appears intact in the legs and feet bilaterally Musculoskeletal: No spine or sacral tenderness   Lab Results:  Lab Results  Component Value Date   WBC 9.7 01/15/2022   HGB 15.9 01/15/2022   HCT 47.3 01/15/2022   MCV 88.7 01/15/2022   PLT 220 01/15/2022   NEUTROABS 7.9 (H) 01/15/2022    CMP  Lab Results  Component Value Date   NA 132 (L) 01/15/2022   K 3.9 01/15/2022   CL 95 (L) 01/15/2022   CO2 25 01/15/2022   GLUCOSE 224 (H) 01/15/2022   BUN 19 01/15/2022   CREATININE 1.38 (H) 01/15/2022   CALCIUM 9.1 01/15/2022   PROT 6.8 01/15/2022   ALBUMIN 4.2 01/15/2022   AST 18 01/15/2022   ALT 17 01/15/2022   ALKPHOS 55 01/15/2022   BILITOT 0.3 01/15/2022   GFRNONAA >60 01/15/2022   GFRAA >60 08/10/2020    No results found for: CEA1, CEA, CAN199, CA125  Lab Results  Component Value Date   INR 0.90 10/14/2017   LABPROT 12.1 10/14/2017    Imaging:  NM PET Image Restag (PS) Skull Base To  Thigh  Result Date: 01/12/2022 CLINICAL DATA:  Initial treatment strategy for myeloma. EXAM: NUCLEAR MEDICINE PET SKULL BASE TO THIGH TECHNIQUE: 13.83 mCi F-18 FDG was injected intravenously. Full-ring PET imaging was performed from the skull base to thigh after the radiotracer. CT data was obtained and used for attenuation correction and anatomic localization. Fasting blood glucose: 160 mg/dl COMPARISON:  CT of the spine from January 2023 and CT of the pelvis of the same date. No additional PET imaging for comparison. FINDINGS: Mediastinal blood pool activity: SUV max 3.52 Liver activity: SUV max NA NECK: No hypermetabolic lymph nodes in the neck. Incidental CT findings: Mucous retention cyst or polyp in the RIGHT maxillary sinus. CHEST: Small RIGHT thyroid lesion (image 99/3) 12 mm with mildly increased metabolic activity though metabolic activity is similar to mediastinal blood pool. No adenopathy in the chest. No hypermetabolic lesions in the lungs. Incidental CT findings: none ABDOMEN/PELVIS: No abnormal hypermetabolic activity within the liver, pancreas, adrenal glands, or spleen. No hypermetabolic lymph nodes in the abdomen or pelvis. Incidental CT findings: Post cholecystectomy. No acute findings in the abdomen. SKELETON: Sclerotic lesion centered about the S2-S3 level shows mixed lytic and sclerotic change measuring up to 3.6 x 3.5 cm greatest axial dimension and shows markedly increased metabolic activity with a maximum SUV of 11.9. There are numerous  lytic and sclerotic foci within visible areas of bone Lucent lesions in the calvarium not associated with increased metabolic activity. Scattered lytic changes, tumor numerous to count throughout the spine both thoracic and lumbar spine as well as lower cervical spine. Lytic lesions in the bilateral proximal humeral metaphysis ease extending in the humeral heads with low level FDG uptake maximum SUV on the LEFT at 3.4 for example. Lucency within the  medullary space without violation of overlying cortex measuring up to 3 cm greatest axial dimension on image 108/3. RIGHT humeral lesion extending into the humeral head 3.2 x 3.2 cm (image 100/3) Scattered chronic appearing rib fractures. Expansile changes of the LEFT eighth rib posteriorly with only low level FDG uptake. Lytic lesion in the RIGHT acromion (image 99/3) maximum SUV 2.78. Scattered areas in the scapulae with only minimal FDG uptake greatest areas sclerotic on image 90/3 with a maximum SUV of 2.79 in the RIGHT scapula. Heterogeneous uptake throughout the spine without uptake greater than mediastinal blood pool aside from the sacrum which was mention previously seen in the spine or sacrum. Sacral uptake extends into predominantly lytic changes in the mid to lower sacrum and is contiguous with mixed lytic and sclerotic changes outlined above. Bilateral mixed lytic and sclerotic lesions involving the pelvis not substantially changed compared to very recent imaging and with signs of prior biopsy traversing the LEFT iliac crest. Metabolic activity on the RIGHT within predominantly lytic changes showing a maximum SUV of 3.9 (image 250/3) Lucent areas in the bilateral acetabuli the display a well-circumscribed margins compatible with a disease and with increased metabolic activity with a maximum SUV of 4.7. (Image 269/3) area measuring 3.5 cm on the RIGHT and 3.7 cm on the LEFT. Chronic compression fracture at the T4 level without substantial change since October of 2020. Bilateral proximal femora with lytic changes as well displaying mild-to-moderate FDG uptake, for example in the LEFT femoral neck (image 299/3) lucent lesion measuring 2.5 cm shows a maximum SUV of 3.72. Incidental CT findings: none IMPRESSION: Extensive changes of myeloma with evidence of active myeloma most notably within the sacrum with other areas of predominantly lytic change showing moderate uptake as discussed. Small RIGHT thyroid  lesion with borderline uptake of FDG. Recommend thyroid US and biopsy (ref: J Am Coll Radiol. 2015 Feb;12(2): 143-50). Electronically Signed   By: Zetta Bills M.D.   On: 01/12/2022 15:49    Medications: I have reviewed the patient's current medications.   Assessment/Plan: Multiple myeloma- IgG lambda serum monoclonal protein, elevated serum lambda light chains Bone survey 10/11/2017-lytic lesions noted in the skull, right iliac, left second rib, and mottled appearance of the proximal femurs/pelvis Bone marrow biopsy 10/14/2017-hypercellular marrow with plasma cell neoplasm, 82% plasma cells, lambda light chain restricted, hyperdiploid with gains of chromosomes 3, 5, 7, 9, and 11.  FISH panel positive for gain of ATM (+11) Cycle 1 RVD 10/18/2017 (Revlimid started 10/23/2017) Cycle 2 RVD 11/15/2017 Cycle 3 RVD 12/13/2017 Cycle 4 RVD 01/14/2018 Cycle 5 of RVD 02/14/2018 (Revlimid given for 7 days and 1 week of Velcade), therapy held beginning 02/21/2018 secondary to plan for stem cell therapy Bone marrow biopsy 02/20/2018, 1-2% plasma cell PET scan 02/20/2018, no malignant range activity above background, numerous lytic lesions throughout the axial and appendicular skeleton,, left iliac wing fracture Melphalan, 200 mg/m on 03/24/2018 Stem cell infusion 03/25/2018 Restaging at [facility] 07/09/2018: Normal lambda light chains, no serum M spike, bone marrow biopsy negative for myeloma, less than 1% plasma cells  PET scan at [facility] 07/09/2018- lytic bone lesions, no malignant range activity Initiation of maintenance Revlimid, 10 mg, 21/28 days 08/15/2018 Cycle 2 maintenance Revlimid 09/12/2018 Cycle 3 maintenance Revlimid 10/10/2018 Revlimid placed on hold 06/15/2019 due to presyncopal/syncopal episodes and diarrhea Revlimid resumed 5 mg 21 days on/7 days off following office visit 06/29/2019 Revlimid placed on hold 07/14/2019 Revlimid resumed 08/21/2019 Bone survey 09/09/2019- no acute findings or clear  explanation for right buttock pain.  Lucent lesions in the right pelvis are stable without pathologic fracture.  Evidence of healing lytic lesions in the right scapula, cervical spine spinous processes and left L4 transverse process.  Possible mild progression of lytic lesions within the L1 and L4 vertebral bodies.  Stable lytic lesions in the calvarium. PET scan 09/25/2019-new FDG avid bone lesions in the right humeral neck and sacrum similar remaining lytic lesions with FDG activity below background Bone marrow biopsy at [facility] on 10/01/2019-5% plasma cells on the bone marrow biopsy suboptimal sample  Cycle 1 daratumumab, pomalidomide, Decadron 10/19/2019 Zometa 10/19/2019 Cycle 2 daratumumab, pomalidomide, Decadron 11/16/2019 (he will begin the pomalidomide 11/17/2019) Cycle 3 daratumumab, pomalidomide, and Decadron 12/14/2019 Cycle 4 daratumumab, pomalidomide, and Decadron 01/11/2020 Cycle 5 daratumumab, pomalidomide, Decadron 02/03/2020 (he began pomalidomide on 02/06/2020) Cycle 6 daratumumab, pomalidomide, Decadron 03/02/2020 (pomalidomide starting 03/12/2020) Cycle 7 daratumumab, pomalidomide, Decadron 03/29/2020 (pomalidomide scheduled to start 04/15/2020) Cycle 8 daratumumab, pomalidomide, Decadron 05/04/2020 (pomalidomide started 05/14/2020) Cycle 9 daratumumab-monthly 06/15/2020, pomalidomide held Cycle 10 daratumumab 07/13/2020, pomalidomide 2 mg 21 days beginning 07/15/2020 Cycle 11 daratumumab 08/10/2020, pomalidomide 2 mg 21 days beginning 08/12/2020 Bone marrow biopsy 09/06/2020-hypocellular bone marrow with a relative erythroid hyperplasia and no increase in plasma cells; MRD 0.0050% Cycle 12 daratumumab 09/07/2020, pomalidomide 2 mg 21 days beginning 09/09/2020 Cycle 13 daratumumab 10/11/2020, pomalidomide 2 mg 21 days beginning 10/10/2020 Cycle 14 daratumumab 11/08/2020, pomalidomide reduced to 1 mg 21 days Cycle 15 daratumumab 12/14/2020, Pomalidomide 1 mg 21 days beginning 12/13/2020 Cycle 16  daratumumab 01/12/2021, pomalidomide 1 mg 21 days beginning 01/11/2021 Cycle 17 daratumumab 02/10/2021, pomalidomide 1 mg 21 days beginning 02/09/2021 Cycle 18 daratumumab 03/10/2021, pomalidomide 1 mg 21 days beginning 03/11/2021 Cycle 19 daratumumab 04/07/2021, Pomalidomide 1 mg 21 days beginning 04/08/2021 Cycle 20 daratumumab 05/09/2021, Pomalidomide 1 mg 21 days beginning 05/12/2021 Cycle 21 daratumumab 06/07/2021, pomalidomide held secondary to progressive neuropathy and diarrhea Cycle 22 daratumumab 07/05/2021, pomalidomide held secondary to neuropathy Cycle 23 daratumumab 08/02/2021, Pomalidomide remains on hold Cycle 24 daratumumab 08/30/2021, pomalidomide on hold Cycle 25 daratumumab 09/27/2021, pomalidomide on hold Cycle 26 daratumumab 10/23/2021, pomalidomide on hold Cycle 27 daratumumab 11/20/2021, pomalidomide on hold Cycle 28 daratumumab 12/18/2021, Pomalidomide on hold Increased serum M spike and serum free lambda light chains December 2022 PET 01/12/2022-multiple lytic lesions with mild hypermetabolism, most prominent hypermetabolic lesion at the sacrum   2.  Pain secondary to multiple myeloma involving the spine and pelvis-resolved MRI of the lumbar spine 10/02/2018- numerous rounded foci in the vertebral bodies, hypertrophy of the L4 transverse process   3.  Hypertension-losartan dose increased 11/15/2017   4.  Depression-improved with Wellbutrin beginning September 2020   5.  Altered mental status-improved depression related? 6. Diabetes 7.  Recurrent episodes of fall/syncope-etiology unclear-evaluated by neurology at [facility], MRI brain 01/25/2020 with abnormal signal at the left superior frontal gyrus-potentially representing a low-grade glioma, scattered foci of increased T2 white matter signal-likely related to chronic small vessel disease Stereotactic brain biopsy at [facility] 03/14/2020-gliosis, no evidence of malignancy   8.  Peripheral neuropathy-gabapentin started 07/07/2020;  reports increase in symptoms 06/07/2021-pomalidomide placed on hold; reports increase in symptoms 08/02/2021   9.  Renal insufficiency     Disposition: [name] has multiple myeloma.  There is clinical, radiologic, and laboratory evidence of disease progression.  I discussed the PET findings and reviewed the images with [name] and his wife.  He will continue oxycodone as needed for pain.  Daratumumab/Decadron will be discontinued.  We discussed treatment options including palliative radiation to the sacrum.  We discussed salvage systemic treatment options including Cytoxan based therapy, CAR-T therapy, and by specific antibodies.  I will consult with Dr.McKay within the next few days to decide on a treatment plan.  [name] will return for an office visit next week.    Betsy Coder, MD  01/15/2022  1:16 PM

## 2022-01-16 ENCOUNTER — Telehealth: Payer: Self-pay

## 2022-01-16 LAB — KAPPA/LAMBDA LIGHT CHAINS
Kappa free light chain: 7 mg/L (ref 3.3–19.4)
Kappa, lambda light chain ratio: 0.09 — ABNORMAL LOW (ref 0.26–1.65)
Lambda free light chains: 74.3 mg/L — ABNORMAL HIGH (ref 5.7–26.3)

## 2022-01-16 NOTE — Telephone Encounter (Signed)
Message given to Olivia Mackie, pt's wife, per MD Benay Spice regarding conversation with MD Feliciana Rossetti. Pt's wife verbalizes understanding of appt changes and appreciates call back.

## 2022-01-17 LAB — PROTEIN ELECTROPHORESIS, SERUM
A/G Ratio: 1.4 (ref 0.7–1.7)
Albumin ELP: 3.7 g/dL (ref 2.9–4.4)
Alpha-1-Globulin: 0.2 g/dL (ref 0.0–0.4)
Alpha-2-Globulin: 1.2 g/dL — ABNORMAL HIGH (ref 0.4–1.0)
Beta Globulin: 0.8 g/dL (ref 0.7–1.3)
Gamma Globulin: 0.6 g/dL (ref 0.4–1.8)
Globulin, Total: 2.7 g/dL (ref 2.2–3.9)
M-Spike, %: 0.3 g/dL — ABNORMAL HIGH
Total Protein ELP: 6.4 g/dL (ref 6.0–8.5)

## 2022-01-17 NOTE — Progress Notes (Signed)
Steve Carter [id number] 04-06-1966 56 y.o.  Virtual Visit via Video Note  I connected with pt @ on 01/17/22 at  2:40 PM EST by a video enabled telemedicine application and verified that I am speaking with the correct person using two identifiers.   I discussed the limitations of evaluation and management by telemedicine and the availability of in person appointments. The patient expressed understanding and agreed to proceed.  I discussed the assessment and treatment plan with the patient. The patient was provided an opportunity to ask questions and all were answered. The patient agreed with the plan and demonstrated an understanding of the instructions.   The patient was advised to call back or seek an in-person evaluation if the symptoms worsen or if the condition fails to improve as anticipated.  I provided 20 minutes of non-face-to-face time during this encounter.  The patient was located at home.  The provider was located at [place].   Aloha Gell, NP   Subjective:   Patient ID:  Steve Carter is a 56 y.o. (DOB 16-Apr-1966) male.  Chief Complaint: No chief complaint on file.   HPI Steve Carter presents for follow-up of depression, anxiety, insomnia, and irritability     Describes mood today as "ok". Pleasant. Mood symptoms - denies depression, anxiety, and irritability. Mood is stable. Stating "I'm doing alright". Feels like medications are working well. Taking current medications as prescribed. Energy levels low. Active, unable to establish a regular exercise routine with physical disabilities. Enjoys some usual interests and activities. Spending time with family wife - and 2 children. Eating breakfast with friends. [place]. Appetite adequate. Weight loss - 262 from 276 pounds. Sleeping difficulties - up and down during the nights. Napping during the day.  Focus and concentration difficulties. Completing tasks. Managing aspects  of household. Out of work for the past 3 years. Denies SI or HI.  Denies AH or VH.   Review of Systems:  Review of Systems  Musculoskeletal:  Negative for gait problem.  Neurological:  Negative for tremors.  Psychiatric/Behavioral:         Please refer to HPI   Medications: I have reviewed the patient's current medications.  Current Outpatient Medications  Medication Sig Dispense Refill   traZODone (DESYREL) 50 MG tablet Take 1 tablet (50 mg total) by mouth at bedtime. 90 tablet 3   ARIPiprazole (ABILIFY) 15 MG tablet Take 1 tablet (15 mg total) by mouth daily. 90 tablet 3   aspirin EC 81 MG tablet Take 1 tablet (81 mg total) daily by mouth.     buPROPion (WELLBUTRIN XL) 150 MG 24 hr tablet Take 3 tablets (450 mg total) by mouth every morning. 270 tablet 3   BYSTOLIC 10 MG tablet Take 10 mg by mouth 2 (two) times daily.     dexamethasone (DECADRON) 4 MG tablet Take 20 mg (#5 tab) by mouth on day after each chemotherapy treatment 20 tablet 1   escitalopram (LEXAPRO) 20 MG tablet Take 1 tablet (20 mg total) by mouth daily. 90 tablet 3   hydrochlorothiazide (HYDRODIURIL) 25 MG tablet Take 25 mg by mouth daily.     insulin glargine (LANTUS) 100 UNIT/ML injection Inject into the skin daily as needed. Based on glucose reading at bedtime-Hold if 150 or lower     LORazepam (ATIVAN) 0.5 MG tablet Take 1 tab 30 minutes to 1 hour prior to PET scan; may repeat x 1 2 tablet 0   losartan (COZAAR) 100 MG tablet TAKE  1 TABLET BY MOUTH EVERY DAY 30 tablet 0   Omeprazole Magnesium (PRILOSEC OTC PO) Take 20 mg daily by mouth.     oxyCODONE-acetaminophen (PERCOCET/ROXICET) 5-325 MG tablet Take 1 tablet by mouth every 8 (eight) hours as needed for severe pain. 60 tablet 0   pomalidomide (POMALYST) 1 MG capsule TAKE 1 CAPSULE BY MOUTH  DAILY FOR 21 DAYS, THEN 7  DAYS OFF (Patient not taking: Reported on 08/30/2021) 21 capsule 0   pregabalin (LYRICA) 300 MG capsule Take by mouth.     primidone (MYSOLINE) 250  MG tablet Take by mouth.     primidone (MYSOLINE) 50 MG tablet Take 100 mg by mouth at bedtime. Will increase to 100 mg in 1 month     testosterone cypionate (DEPOTESTOTERONE CYPIONATE) 100 MG/ML injection Inject 200 mg into the muscle every 14 (fourteen) days. For IM use only     No current facility-administered medications for this visit.    Medication Side Effects: None  Allergies: No Known Allergies  Past Medical History:  Diagnosis Date   Anxiety    Depression    GERD (gastroesophageal reflux disease)    Hypertension    Sleep apnea    uses C-Pap    No family history on file.  Social History   Socioeconomic History   Marital status: Married    Spouse name: Not on file   Number of children: Not on file   Years of education: Not on file   Highest education level: Not on file  Occupational History   Not on file  Tobacco Use   Smoking status: Never   Smokeless tobacco: Current    Types: Snuff  Vaping Use   Vaping Use: Never used  Substance and Sexual Activity   Alcohol use: Yes    Alcohol/week: 1.0 standard drink    Types: 1 Cans of beer per week   Drug use: No   Sexual activity: Not on file  Other Topics Concern   Not on file  Social History Narrative   Not on file   Social Determinants of Health   Financial Resource Strain: Not on file  Food Insecurity: Not on file  Transportation Needs: Not on file  Physical Activity: Not on file  Stress: Not on file  Social Connections: Not on file  Intimate Partner Violence: Not on file    Past Medical History, Surgical history, Social history, and Family history were reviewed and updated as appropriate.   Please see review of systems for further details on the patient's review from today.   Objective:   Physical Exam:  There were no vitals taken for this visit.  Physical Exam Constitutional:      General: He is not in acute distress. Musculoskeletal:        General: No deformity.  Neurological:      Mental Status: He is alert and oriented to person, place, and time.     Coordination: Coordination normal.  Psychiatric:        Attention and Perception: Attention and perception normal. He does not perceive auditory or visual hallucinations.        Mood and Affect: Mood normal. Mood is not anxious or depressed. Affect is not labile, blunt, angry or inappropriate.        Speech: Speech normal.        Behavior: Behavior normal.        Thought Content: Thought content normal. Thought content is not paranoid or delusional. Thought content does not  include homicidal or suicidal ideation. Thought content does not include homicidal or suicidal plan.        Cognition and Memory: Cognition and memory normal.        Judgment: Judgment normal.     Comments: Insight intact    Lab Review:     Component Value Date/Time   NA 132 (L) 01/15/2022 0903   NA 136 11/29/2017 0935   K 3.9 01/15/2022 0903   K 3.5 11/29/2017 0935   CL 95 (L) 01/15/2022 0903   CO2 25 01/15/2022 0903   CO2 22 11/29/2017 0935   GLUCOSE 224 (H) 01/15/2022 0903   GLUCOSE 232 (H) 11/29/2017 0935   BUN 19 01/15/2022 0903   BUN 11.3 11/29/2017 0935   CREATININE 1.38 (H) 01/15/2022 0903   CREATININE 0.9 11/29/2017 0935   CALCIUM 9.1 01/15/2022 0903   CALCIUM 8.5 11/29/2017 0935   PROT 6.8 01/15/2022 0903   PROT 8.2 11/15/2017 0810   PROT 8.5 (H) 11/15/2017 0810   ALBUMIN 4.2 01/15/2022 0903   ALBUMIN 3.3 (L) 11/15/2017 0810   AST 18 01/15/2022 0903   AST 20 11/15/2017 0810   ALT 17 01/15/2022 0903   ALT 39 11/15/2017 0810   ALKPHOS 55 01/15/2022 0903   ALKPHOS 100 11/15/2017 0810   BILITOT 0.3 01/15/2022 0903   BILITOT 0.45 11/15/2017 0810   GFRNONAA >60 01/15/2022 0903   GFRAA >60 08/10/2020 0941       Component Value Date/Time   WBC 9.7 01/15/2022 0903   WBC 8.9 01/21/2018 0820   RBC 5.33 01/15/2022 0903   HGB 15.9 01/15/2022 0903   HGB 14.8 11/29/2017 0935   HCT 47.3 01/15/2022 0903   HCT 43.4 11/29/2017  0935   PLT 220 01/15/2022 0903   PLT 265 11/29/2017 0935   MCV 88.7 01/15/2022 0903   MCV 91.4 11/29/2017 0935   MCH 29.8 01/15/2022 0903   MCHC 33.6 01/15/2022 0903   RDW 13.8 01/15/2022 0903   RDW 14.7 (H) 11/29/2017 0935   LYMPHSABS 0.9 01/15/2022 0903   LYMPHSABS 0.8 (L) 11/29/2017 0935   MONOABS 0.7 01/15/2022 0903   MONOABS 0.3 11/29/2017 0935   EOSABS 0.1 01/15/2022 0903   EOSABS 0.1 11/29/2017 0935   BASOSABS 0.0 01/15/2022 0903   BASOSABS 0.0 11/29/2017 0935    No results found for: POCLITH, LITHIUM   No results found for: PHENYTOIN, PHENOBARB, VALPROATE, CBMZ   .res Assessment: Plan:     Plan:   Abilify 15mg  tab daily for mood symptoms Lexapro 20mg  daily Wellbutrin XL 450mg  every morning Add Trazadone 50mg  at hs   RTC 4 weeks   Patient advised to contact office with any questions, adverse effects, or acute worsening in signs and symptoms.   Discussed potential metabolic side effects associated with atypical antipsychotics, as well as potential risk for movement side effects. Advised pt to contact office if movement side effects occur.  Diagnoses and all orders for this visit:  Insomnia, unspecified type  Moderate episode of recurrent major depressive disorder (HCC) -     ARIPiprazole (ABILIFY) 15 MG tablet; Take 1 tablet (15 mg total) by mouth daily. -     buPROPion (WELLBUTRIN XL) 150 MG 24 hr tablet; Take 3 tablets (450 mg total) by mouth every morning. -     escitalopram (LEXAPRO) 20 MG tablet; Take 1 tablet (20 mg total) by mouth daily. -     traZODone (DESYREL) 50 MG tablet; Take 1 tablet (50 mg total) by mouth at  bedtime.  GAD (generalized anxiety disorder) -     ARIPiprazole (ABILIFY) 15 MG tablet; Take 1 tablet (15 mg total) by mouth daily. -     buPROPion (WELLBUTRIN XL) 150 MG 24 hr tablet; Take 3 tablets (450 mg total) by mouth every morning. -     escitalopram (LEXAPRO) 20 MG tablet; Take 1 tablet (20 mg total) by mouth daily. -     traZODone  (DESYREL) 50 MG tablet; Take 1 tablet (50 mg total) by mouth at bedtime.     Please see After Visit Summary for patient specific instructions.  No future appointments.  No orders of the defined types were placed in this encounter.     -------------------------------

## 2022-01-21 ENCOUNTER — Other Ambulatory Visit: Payer: Self-pay | Admitting: Nurse Practitioner

## 2022-01-21 DIAGNOSIS — C9000 Multiple myeloma not having achieved remission: Secondary | ICD-10-CM

## 2022-01-22 ENCOUNTER — Other Ambulatory Visit: Payer: Self-pay | Admitting: Nurse Practitioner

## 2022-01-22 ENCOUNTER — Encounter: Payer: Self-pay | Admitting: Oncology

## 2022-01-22 DIAGNOSIS — C9000 Multiple myeloma not having achieved remission: Secondary | ICD-10-CM

## 2022-01-22 MED ORDER — OXYCODONE-ACETAMINOPHEN 5-325 MG PO TABS: 1.0000 | ORAL_TABLET | Freq: Three times a day (TID) | ORAL | 0 refills | Status: DC | PRN

## 2022-01-23 ENCOUNTER — Encounter: Payer: Self-pay | Admitting: Oncology

## 2022-01-24 ENCOUNTER — Other Ambulatory Visit: Payer: Self-pay | Admitting: Oncology

## 2022-01-24 NOTE — Progress Notes (Signed)
DISCONTINUE ON PATHWAY REGIMEN - Multiple Myeloma and Other Plasma Cell Dyscrasias     A cycle is every 21 days:     Bortezomib      Lenalidomide      Dexamethasone   **Always confirm dose/schedule in your pharmacy ordering system**  REASON: Disease Progression PRIOR TREATMENT: [id number]: VRd (Bortezomib 1.3 mg/m2 Subcut D1, 8, 15 + Lenalidomide 25 mg + Dexamethasone 40 mg) q21 Days x 4-6 Cycles Maximum Prior to St[facility] TREATMENT RESPONSE: Partial Response (PR)  START ON PATHWAY REGIMEN - Multiple Myeloma and Other Plasma Cell Dyscrasias     Cycle 1: A cycle is 28 days:     Cyclophosphamide      Dexamethasone      Carfilzomib      Carfilzomib    Cycles 2 through 6: A cycle is every 28 days:     Cyclophosphamide      Dexamethasone      Carfilzomib   **Always confirm dose/schedule in your pharmacy ordering system**  Patient Characteristics: Multiple Myeloma, Relapsed / Refractory, Second through Fourth Lines of Therapy, Fit or Candidate for Triplet Therapy, Lenalidomide-Refractory or Lenalidomide-based Regimen Not Preferred, Not a Candidate for Anti-CD38 Antibody Disease Classification: Multiple Myeloma R-ISS Staging: Unknown Therapeutic Status: Refractory Line of Therapy: Fourth Line Anti-CD38 Antibody Candidacy: Not a Candidate for Anti-CD38 Antibody Lenalidomide-based Regimen Preference/Candidacy: Lenalidomide-based Regimen Not Preferred Intent of Therapy: Non-Curative / Palliative Intent, Discussed with Patient

## 2022-01-25 ENCOUNTER — Encounter: Payer: Self-pay | Admitting: Oncology

## 2022-01-25 ENCOUNTER — Other Ambulatory Visit: Payer: Self-pay | Admitting: Nurse Practitioner

## 2022-01-25 ENCOUNTER — Encounter: Payer: Self-pay | Admitting: *Deleted

## 2022-01-25 ENCOUNTER — Ambulatory Visit: Payer: Medicare PPO | Admitting: Oncology

## 2022-01-25 ENCOUNTER — Other Ambulatory Visit: Payer: Self-pay

## 2022-01-25 ENCOUNTER — Other Ambulatory Visit: Payer: Self-pay | Admitting: *Deleted

## 2022-01-25 ENCOUNTER — Inpatient Hospital Stay: Payer: Medicare PPO | Admitting: Oncology

## 2022-01-25 VITALS — BP 117/80 | HR 70 | Temp 97.8°F | Resp 18 | Ht 74.0 in | Wt 282.2 lb

## 2022-01-25 DIAGNOSIS — C9000 Multiple myeloma not having achieved remission: Secondary | ICD-10-CM | POA: Diagnosis not present

## 2022-01-25 DIAGNOSIS — Z5112 Encounter for antineoplastic immunotherapy: Secondary | ICD-10-CM | POA: Diagnosis not present

## 2022-01-25 MED ORDER — ONDANSETRON HCL 8 MG PO TABS
8.0000 mg | ORAL_TABLET | Freq: Three times a day (TID) | ORAL | 1 refills | Status: AC | PRN
Start: 2022-01-25 — End: ?

## 2022-01-25 MED ORDER — PROCHLORPERAZINE MALEATE 5 MG PO TABS
5.0000 mg | ORAL_TABLET | Freq: Four times a day (QID) | ORAL | 1 refills | Status: DC | PRN
Start: 2022-01-25 — End: 2022-05-01

## 2022-01-25 MED ORDER — LIDOCAINE-PRILOCAINE 2.5-2.5 % EX CREA: 1.0000 "application " | TOPICAL_CREAM | CUTANEOUS | 2 refills | Status: DC | PRN

## 2022-01-25 NOTE — Progress Notes (Signed)
[place] OFFICE PROGRESS NOTE   Diagnosis: Multiple myeloma  INTERVAL HISTORY:   Steve Carter returns as scheduled.  He continues to have pain at the sacrum.  He takes oxycodone several times per day.  No new complaint.  He saw Dr. Feliciana Rossetti on 01/22/2022.  They discussed treatment options including a clinical trial and teclistamab.  He does not qualify for the clinical trial at present and the by specific antibody is not yet available at [facility].  Dr. Feliciana Rossetti recommends treatment with carfilzomib/Cytoxan/Decadron.  Objective:  Vital signs in last 24 hours:  Blood pressure 117/80, pulse 70, temperature 97.8 F (36.6 C), temperature source Oral, resp. rate 18, height $RemoveBe'6\' 2"'ehyiTiuuv$  (1.88 m), weight 282 lb 3.2 oz (128 kg), SpO2 97 %.     Resp: Lungs clear bilaterally Cardio: Regular rate and rhythm GI: No hepatosplenomegaly Vascular: No leg edema Musculoskeletal: No tenderness of the low back or sacrum   Lab Results:  Lab Results  Component Value Date   WBC 9.7 01/15/2022   HGB 15.9 01/15/2022   HCT 47.3 01/15/2022   MCV 88.7 01/15/2022   PLT 220 01/15/2022   NEUTROABS 7.9 (H) 01/15/2022    CMP  Lab Results  Component Value Date   NA 132 (L) 01/15/2022   K 3.9 01/15/2022   CL 95 (L) 01/15/2022   CO2 25 01/15/2022   GLUCOSE 224 (H) 01/15/2022   BUN 19 01/15/2022   CREATININE 1.38 (H) 01/15/2022   CALCIUM 9.1 01/15/2022   PROT 6.8 01/15/2022   ALBUMIN 4.2 01/15/2022   AST 18 01/15/2022   ALT 17 01/15/2022   ALKPHOS 55 01/15/2022   BILITOT 0.3 01/15/2022   GFRNONAA >60 01/15/2022   GFRAA >60 08/10/2020     Medications: I have reviewed the patient's current medications.   Assessment/Plan: Multiple myeloma- IgG lambda serum monoclonal protein, elevated serum lambda light chains Bone survey 10/11/2017-lytic lesions noted in the skull, right iliac, left second rib, and mottled appearance of the proximal femurs/pelvis Bone marrow biopsy  10/14/2017-hypercellular marrow with plasma cell neoplasm, 82% plasma cells, lambda light chain restricted, hyperdiploid with gains of chromosomes 3, 5, 7, 9, and 11.  FISH panel positive for gain of ATM (+11) Cycle 1 RVD 10/18/2017 (Revlimid started 10/23/2017) Cycle 2 RVD 11/15/2017 Cycle 3 RVD 12/13/2017 Cycle 4 RVD 01/14/2018 Cycle 5 of RVD 02/14/2018 (Revlimid given for 7 days and 1 week of Velcade), therapy held beginning 02/21/2018 secondary to plan for stem cell therapy Bone marrow biopsy 02/20/2018, 1-2% plasma cell PET scan 02/20/2018, no malignant range activity above background, numerous lytic lesions throughout the axial and appendicular skeleton,, left iliac wing fracture Melphalan, 200 mg/m on 03/24/2018 Stem cell infusion 03/25/2018 Restaging at [facility] 07/09/2018: Normal lambda light chains, no serum M spike, bone marrow biopsy negative for myeloma, less than 1% plasma cells PET scan at [facility] 07/09/2018- lytic bone lesions, no malignant range activity Initiation of maintenance Revlimid, 10 mg, 21/28 days 08/15/2018 Cycle 2 maintenance Revlimid 09/12/2018 Cycle 3 maintenance Revlimid 10/10/2018 Revlimid placed on hold 06/15/2019 due to presyncopal/syncopal episodes and diarrhea Revlimid resumed 5 mg 21 days on/7 days off following office visit 06/29/2019 Revlimid placed on hold 07/14/2019 Revlimid resumed 08/21/2019 Bone survey 09/09/2019- no acute findings or clear explanation for right buttock pain.  Lucent lesions in the right pelvis are stable without pathologic fracture.  Evidence of healing lytic lesions in the right scapula, cervical spine spinous processes and left L4 transverse process.  Possible mild  progression of lytic lesions within the L1 and L4 vertebral bodies.  Stable lytic lesions in the calvarium. PET scan 09/25/2019-new FDG avid bone lesions in the right humeral neck and sacrum similar remaining lytic lesions with FDG activity below background Bone marrow biopsy at  [facility] on 10/01/2019-5% plasma cells on the bone marrow biopsy suboptimal sample  Cycle 1 daratumumab, pomalidomide, Decadron 10/19/2019 Zometa 10/19/2019 Cycle 2 daratumumab, pomalidomide, Decadron 11/16/2019 (he will begin the pomalidomide 11/17/2019) Cycle 3 daratumumab, pomalidomide, and Decadron 12/14/2019 Cycle 4 daratumumab, pomalidomide, and Decadron 01/11/2020 Cycle 5 daratumumab, pomalidomide, Decadron 02/03/2020 (he began pomalidomide on 02/06/2020) Cycle 6 daratumumab, pomalidomide, Decadron 03/02/2020 (pomalidomide starting 03/12/2020) Cycle 7 daratumumab, pomalidomide, Decadron 03/29/2020 (pomalidomide scheduled to start 04/15/2020) Cycle 8 daratumumab, pomalidomide, Decadron 05/04/2020 (pomalidomide started 05/14/2020) Cycle 9 daratumumab-monthly 06/15/2020, pomalidomide held Cycle 10 daratumumab 07/13/2020, pomalidomide 2 mg 21 days beginning 07/15/2020 Cycle 11 daratumumab 08/10/2020, pomalidomide 2 mg 21 days beginning 08/12/2020 Bone marrow biopsy 09/06/2020-hypocellular bone marrow with a relative erythroid hyperplasia and no increase in plasma cells; MRD 0.0050% Cycle 12 daratumumab 09/07/2020, pomalidomide 2 mg 21 days beginning 09/09/2020 Cycle 13 daratumumab 10/11/2020, pomalidomide 2 mg 21 days beginning 10/10/2020 Cycle 14 daratumumab 11/08/2020, pomalidomide reduced to 1 mg 21 days Cycle 15 daratumumab 12/14/2020, Pomalidomide 1 mg 21 days beginning 12/13/2020 Cycle 16 daratumumab 01/12/2021, pomalidomide 1 mg 21 days beginning 01/11/2021 Cycle 17 daratumumab 02/10/2021, pomalidomide 1 mg 21 days beginning 02/09/2021 Cycle 18 daratumumab 03/10/2021, pomalidomide 1 mg 21 days beginning 03/11/2021 Cycle 19 daratumumab 04/07/2021, Pomalidomide 1 mg 21 days beginning 04/08/2021 Cycle 20 daratumumab 05/09/2021, Pomalidomide 1 mg 21 days beginning 05/12/2021 Cycle 21 daratumumab 06/07/2021, pomalidomide held secondary to progressive neuropathy and diarrhea Cycle 22 daratumumab 07/05/2021, pomalidomide held  secondary to neuropathy Cycle 23 daratumumab 08/02/2021, Pomalidomide remains on hold Cycle 24 daratumumab 08/30/2021, pomalidomide on hold Cycle 25 daratumumab 09/27/2021, pomalidomide on hold Cycle 26 daratumumab 10/23/2021, pomalidomide on hold Cycle 27 daratumumab 11/20/2021, pomalidomide on hold Cycle 28 daratumumab 12/18/2021, Pomalidomide on hold Increased serum M spike and serum free lambda light chains December 2022 PET 01/12/2022-multiple lytic lesions with mild hypermetabolism, most prominent hypermetabolic lesion at the sacrum   2.  Pain secondary to multiple myeloma involving the spine and pelvis-resolved MRI of the lumbar spine 10/02/2018- numerous rounded foci in the vertebral bodies, hypertrophy of the L4 transverse process   3.  Hypertension-losartan dose increased 11/15/2017   4.  Depression-improved with Wellbutrin beginning September 2020   5.  Altered mental status-improved depression related? 6. Diabetes 7.  Recurrent episodes of fall/syncope-etiology unclear-evaluated by neurology at [facility], MRI brain 01/25/2020 with abnormal signal at the left superior frontal gyrus-potentially representing a low-grade glioma, scattered foci of increased T2 white matter signal-likely related to chronic small vessel disease Stereotactic brain biopsy at [facility] 03/14/2020-gliosis, no evidence of malignancy   8.  Peripheral neuropathy-gabapentin started 07/07/2020; reports increase in symptoms 06/07/2021-pomalidomide placed on hold; reports increase in symptoms 08/02/2021   9.  Renal insufficiency       Disposition: Steve Carter appears stable.  There is clinical, laboratory, radiologic evidence of progressive myeloma.  He saw Dr. Anne Hahn last week.  He recommends beginning salvage treatment with carfilzomib, cyclophosphamide, and Decadron.    We discussed potential toxicities associated with this regimen including the chance of allergic reaction, rash, fluid retention, dyspnea,  diarrhea, nausea, hematologic toxicity, mucositis, and progressive neuropathy symptoms.  He agrees to proceed.  He was given read materials on cyclophosphamide and carfilzomib today.  He will be referred for Port-A-Cath placement.  The plan is to begin treatment with carfilzomib/Cytoxan/Decadron on a day 1, day 8, day 15 schedule on 01/29/2022.  He will resume acyclovir prophylaxis.  He can discontinue aspirin unless he needs this for cardiovascular protection.  He will be scheduled for an office visit 02/05/2022.  The chemotherapy plan was entered.  Betsy Coder, MD  01/25/2022  8:46 AM

## 2022-01-26 ENCOUNTER — Other Ambulatory Visit: Payer: Self-pay | Admitting: Nurse Practitioner

## 2022-01-26 ENCOUNTER — Other Ambulatory Visit: Payer: Self-pay | Admitting: *Deleted

## 2022-01-26 ENCOUNTER — Other Ambulatory Visit: Payer: Self-pay

## 2022-01-26 DIAGNOSIS — C9000 Multiple myeloma not having achieved remission: Secondary | ICD-10-CM

## 2022-01-26 MED ORDER — OXYCODONE-ACETAMINOPHEN 5-325 MG PO TABS: 1.0000 | ORAL_TABLET | Freq: Four times a day (QID) | ORAL | 0 refills | Status: DC | PRN

## 2022-01-26 NOTE — Telephone Encounter (Signed)
Received refill request from pharmacy for oxycodone-apap. Spoke w/wife regarding early refill and she reports his pain has been worse and he has been taking #2 tabs every 6 hours instead of every 8 hours. MD notified and refill request on desk.

## 2022-01-28 ENCOUNTER — Other Ambulatory Visit: Payer: Self-pay | Admitting: Oncology

## 2022-01-29 ENCOUNTER — Inpatient Hospital Stay: Payer: Medicare PPO

## 2022-01-29 ENCOUNTER — Other Ambulatory Visit: Payer: Self-pay

## 2022-01-29 VITALS — BP 115/78 | HR 68 | Temp 97.8°F | Resp 20 | Ht 74.0 in | Wt 278.0 lb

## 2022-01-29 DIAGNOSIS — C9000 Multiple myeloma not having achieved remission: Secondary | ICD-10-CM

## 2022-01-29 DIAGNOSIS — Z5112 Encounter for antineoplastic immunotherapy: Secondary | ICD-10-CM | POA: Diagnosis not present

## 2022-01-29 LAB — CBC WITH DIFFERENTIAL (CANCER CENTER ONLY)
Abs Immature Granulocytes: 0.06 10*3/uL (ref 0.00–0.07)
Basophils Absolute: 0 10*3/uL (ref 0.0–0.1)
Basophils Relative: 0 %
Eosinophils Absolute: 0.1 10*3/uL (ref 0.0–0.5)
Eosinophils Relative: 1 %
HCT: 46.9 % (ref 39.0–52.0)
Hemoglobin: 15.4 g/dL (ref 13.0–17.0)
Immature Granulocytes: 1 %
Lymphocytes Relative: 11 %
Lymphs Abs: 0.8 10*3/uL (ref 0.7–4.0)
MCH: 29.2 pg (ref 26.0–34.0)
MCHC: 32.8 g/dL (ref 30.0–36.0)
MCV: 89 fL (ref 80.0–100.0)
Monocytes Absolute: 0.6 10*3/uL (ref 0.1–1.0)
Monocytes Relative: 8 %
Neutro Abs: 5.7 10*3/uL (ref 1.7–7.7)
Neutrophils Relative %: 79 %
Platelet Count: 192 10*3/uL (ref 150–400)
RBC: 5.27 MIL/uL (ref 4.22–5.81)
RDW: 14 % (ref 11.5–15.5)
WBC Count: 7.2 10*3/uL (ref 4.0–10.5)
nRBC: 0 % (ref 0.0–0.2)

## 2022-01-29 LAB — CMP (CANCER CENTER ONLY)
ALT: 15 U/L (ref 0–44)
AST: 18 U/L (ref 15–41)
Albumin: 4.2 g/dL (ref 3.5–5.0)
Alkaline Phosphatase: 40 U/L (ref 38–126)
Anion gap: 10 (ref 5–15)
BUN: 15 mg/dL (ref 6–20)
CO2: 27 mmol/L (ref 22–32)
Calcium: 9.2 mg/dL (ref 8.9–10.3)
Chloride: 97 mmol/L — ABNORMAL LOW (ref 98–111)
Creatinine: 1.26 mg/dL — ABNORMAL HIGH (ref 0.61–1.24)
GFR, Estimated: >60 mL/min (ref >60)
Glucose, Bld: 229 mg/dL — ABNORMAL HIGH (ref 70–99)
Potassium: 4 mmol/L (ref 3.5–5.1)
Sodium: 134 mmol/L — ABNORMAL LOW (ref 135–145)
Total Bilirubin: 0.3 mg/dL (ref 0.3–1.2)
Total Protein: 6.7 g/dL (ref 6.5–8.1)

## 2022-01-29 MED ORDER — SODIUM CHLORIDE 0.9 % IV SOLN: Freq: Once | INTRAVENOUS | Status: AC

## 2022-01-29 MED ORDER — DEXTROSE 5 % IV SOLN
40.0000 mg | Freq: Once | INTRAVENOUS | Status: AC
  Administered 2022-01-29: 40 mg via INTRAVENOUS
  Filled 2022-01-29: qty 5

## 2022-01-29 MED ORDER — SODIUM CHLORIDE 0.9 % IV SOLN
500.0000 mg | Freq: Once | INTRAVENOUS | Status: AC
  Administered 2022-01-29: 500 mg via INTRAVENOUS
  Filled 2022-01-29: qty 25

## 2022-01-29 MED ORDER — DEXAMETHASONE 4 MG PO TABS
20.0000 mg | ORAL_TABLET | Freq: Once | ORAL | Status: AC
  Administered 2022-01-29: 20 mg via ORAL
  Filled 2022-01-29: qty 5

## 2022-01-29 NOTE — Patient Instructions (Addendum)
[facility]   Discharge Instructions: Thank you for choosing [facility] to provide your oncology and hematology care.   If you have a lab appointment with the [facility], please go directly to the [facility] and check in at the registration area.   Wear comfortable clothing and clothing appropriate for easy access to any Portacath or PICC line.   We strive to give you quality time with your provider. You may need to reschedule your appointment if you arrive late (15 or more minutes).  Arriving late affects you and other patients whose appointments are after yours.  Also, if you miss three or more appointments without notifying the office, you may be dismissed from the clinic at the providers discretion.      For prescription refill requests, have your pharmacy contact our office and allow 72 hours for refills to be completed.    Today you received the following chemotherapy and/or immunotherapy agents Carfilzomib (KYPROLIS) & Cyclophosphamide (CYTOXAN).      To help prevent nausea and vomiting after your treatment, we encourage you to take your nausea medication as directed.  BELOW ARE SYMPTOMS THAT SHOULD BE REPORTED IMMEDIATELY: *FEVER GREATER THAN 100.4 F (38 C) OR HIGHER *CHILLS OR SWEATING *NAUSEA AND VOMITING THAT IS NOT CONTROLLED WITH YOUR NAUSEA MEDICATION *UNUSUAL SHORTNESS OF BREATH *UNUSUAL BRUISING OR BLEEDING *URINARY PROBLEMS (pain or burning when urinating, or frequent urination) *BOWEL PROBLEMS (unusual diarrhea, constipation, pain near the anus) TENDERNESS IN MOUTH AND THROAT WITH OR WITHOUT PRESENCE OF ULCERS (sore throat, sores in mouth, or a toothache) UNUSUAL RASH, SWELLING OR PAIN  UNUSUAL VAGINAL DISCHARGE OR ITCHING   Items with * indicate a potential emergency and should be followed up as soon as possible or go to the Emergency Department if any problems should occur.  Please show the CHEMOTHERAPY ALERT CARD or  IMMUNOTHERAPY ALERT CARD at check-in to the Emergency Department and triage nurse.  Should you have questions after your visit or need to cancel or reschedule your appointment, please contact [facility]  Dept: [phone]  and follow the prompts.  Office hours are 8:00 a.m. to 4:30 p.m. Monday - Friday. Please note that voicemails left after 4:00 p.m. may not be returned until the following business day.  We are closed weekends and major holidays. You have access to a nurse at all times for urgent questions. Please call the main number to the clinic Dept: [phone] and follow the prompts.   For any non-urgent questions, you may also contact your provider using MyChart. We now offer e-Visits for anyone 55 and older to request care online for non-urgent symptoms. For details visit [email or web address].   Also download the MyChart app! Go to the app store, search "MyChart", open the app, select [facility], and log in with your MyChart username and password.  Due to Covid, a mask is required upon entering the hospital/clinic. If you do not have a mask, one will be given to you upon arrival. For doctor visits, patients may have 1 support person aged 77 or older with them. For treatment visits, patients cannot have anyone with them due to current Covid guidelines and our immunocompromised population.   Carfilzomib injection What is this medication? CARFILZOMIB (kar FILZ oh mib) targets a specific protein within cancer cells and stops the cancer cells from growing. It is used to treat multiple myeloma. This medicine may be used for other purposes; ask your health care  provider or pharmacist if you have questions. COMMON BRAND NAME(S): KYPROLIS What should I tell my care team before I take this medication? They need to know if you have any of these conditions: heart disease history of blood clots irregular heartbeat kidney disease liver disease lung or breathing  disease an unusual or allergic reaction to carfilzomib, or other medicines, foods, dyes, or preservatives pregnant or trying to get pregnant breast-feeding How should I use this medication? This medicine is for injection or infusion into a vein. It is given by a health care professional in a hospital or clinic setting. Talk to your pediatrician regarding the use of this medicine in children. Special care may be needed. Overdosage: If you think you have taken too much of this medicine contact a poison control center or emergency room at once. NOTE: This medicine is only for you. Do not share this medicine with others. What if I miss a dose? It is important not to miss your dose. Call your doctor or health care professional if you are unable to keep an appointment. What may interact with this medication? Interactions are not expected. This list may not describe all possible interactions. Give your health care provider a list of all the medicines, herbs, non-prescription drugs, or dietary supplements you use. Also tell them if you smoke, drink alcohol, or use illegal drugs. Some items may interact with your medicine. What should I watch for while using this medication? Your condition will be monitored while you are receiving this medicine. You may need blood work done while you are taking this medicine. Do not become pregnant while taking this medicine or for 6 months after stopping it. Women should inform their health care provider if they wish to become pregnant or think they might be pregnant. Men should not father a child while taking this medicine and for 3 months after stopping it. There is a potential for serious side effects to an unborn child. Talk to your health care provider for more information. Do not breast-feed an infant while taking this medicine or for 2 weeks after stopping it. Check with your health care provider if you have severe diarrhea, nausea, and vomiting, or if you sweat a  lot. The loss of too much body fluid may make it dangerous for you to take this medicine. You may get drowsy or dizzy. Do not drive, use machinery, or do anything that needs mental alertness until you know how this medicine affects you. Do not stand up or sit up quickly, especially if you are an older patient. This reduces the risk of dizzy or fainting spells. What side effects may I notice from receiving this medication? Side effects that you should report to your doctor or health care professional as soon as possible: allergic reactions like skin rash, itching or hives, swelling of the face, lips, or tongue confusion dizziness feeling faint or lightheaded fever or chills palpitations seizures signs and symptoms of bleeding such as bloody or black, tarry stools; red or dark-brown urine; spitting up blood or brown material that looks like coffee grounds; red spots on the skin; unusual bruising or bleeding including from the eye, gums, or nose signs and symptoms of a blood clot such as breathing problems; changes in vision; chest pain; severe, sudden headache; pain, swelling, warmth in the leg; trouble speaking; sudden numbness or weakness of the face, arm or leg signs and symptoms of kidney injury like trouble passing urine or change in the amount of urine signs and  symptoms of liver injury like dark yellow or brown urine; general ill feeling or flu-like symptoms; light-colored stools; loss of appetite; nausea; right upper belly pain; unusually weak or tired; yellowing of the eyes or skin Side effects that usually do not require medical attention (report to your doctor or health care professional if they continue or are bothersome): back pain cough diarrhea headache muscle cramps trouble sleeping vomiting This list may not describe all possible side effects. Call your doctor for medical advice about side effects. You may report side effects to FDA at 1-800-FDA-1088. Where should I keep my  medication? This drug is given in a hospital or clinic and will not be stored at home. NOTE: This sheet is a summary. It may not cover all possible information. If you have questions about this medicine, talk to your doctor, pharmacist, or health care provider.  2022 Elsevier/Gold Standard (2020-12-29 00:00:00)  Cyclophosphamide Injection What is this medication? CYCLOPHOSPHAMIDE (sye kloe FOSS fa mide) is a chemotherapy drug. It slows the growth of cancer cells. This medicine is used to treat many types of cancer like lymphoma, myeloma, leukemia, breast cancer, and ovarian cancer, to name a few. This medicine may be used for other purposes; ask your health care provider or pharmacist if you have questions. COMMON BRAND NAME(S): Cytoxan, Neosar What should I tell my care team before I take this medication? They need to know if you have any of these conditions: heart disease history of irregular heartbeat infection kidney disease liver disease low blood counts, like white cells, platelets, or red blood cells on hemodialysis recent or ongoing radiation therapy scarring or thickening of the lungs trouble passing urine an unusual or allergic reaction to cyclophosphamide, other medicines, foods, dyes, or preservatives pregnant or trying to get pregnant breast-feeding How should I use this medication? This drug is usually given as an injection into a vein or muscle or by infusion into a vein. It is administered in a hospital or clinic by a specially trained health care professional. Talk to your pediatrician regarding the use of this medicine in children. Special care may be needed. Overdosage: If you think you have taken too much of this medicine contact a poison control center or emergency room at once. NOTE: This medicine is only for you. Do not share this medicine with others. What if I miss a dose? It is important not to miss your dose. Call your doctor or health care professional if  you are unable to keep an appointment. What may interact with this medication? amphotericin B azathioprine certain antivirals for HIV or hepatitis certain medicines for blood pressure, heart disease, irregular heart beat certain medicines that treat or prevent blood clots like warfarin certain other medicines for cancer cyclosporine etanercept indomethacin medicines that relax muscles for surgery medicines to increase blood counts metronidazole This list may not describe all possible interactions. Give your health care provider a list of all the medicines, herbs, non-prescription drugs, or dietary supplements you use. Also tell them if you smoke, drink alcohol, or use illegal drugs. Some items may interact with your medicine. What should I watch for while using this medication? Your condition will be monitored carefully while you are receiving this medicine. You may need blood work done while you are taking this medicine. Drink water or other fluids as directed. Urinate often, even at night. Some products may contain alcohol. Ask your health care professional if this medicine contains alcohol. Be sure to tell all health care professionals you are  taking this medicine. Certain medicines, like metronidazole and disulfiram, can cause an unpleasant reaction when taken with alcohol. The reaction includes flushing, headache, nausea, vomiting, sweating, and increased thirst. The reaction can last from 30 minutes to several hours. Do not become pregnant while taking this medicine or for 1 year after stopping it. Women should inform their health care professional if they wish to become pregnant or think they might be pregnant. Men should not father a child while taking this medicine and for 4 months after stopping it. There is potential for serious side effects to an unborn child. Talk to your health care professional for more information. Do not breast-feed an infant while taking this medicine or for 1  week after stopping it. This medicine has caused ovarian failure in some women. This medicine may make it more difficult to get pregnant. Talk to your health care professional if you are concerned about your fertility. This medicine has caused decreased sperm counts in some men. This may make it more difficult to father a child. Talk to your health care professional if you are concerned about your fertility. Call your health care professional for advice if you get a fever, chills, or sore throat, or other symptoms of a cold or flu. Do not treat yourself. This medicine decreases your body's ability to fight infections. Try to avoid being around people who are sick. Avoid taking medicines that contain aspirin, acetaminophen, ibuprofen, naproxen, or ketoprofen unless instructed by your health care professional. These medicines may hide a fever. Talk to your health care professional about your risk of cancer. You may be more at risk for certain types of cancer if you take this medicine. If you are going to need surgery or other procedure, tell your health care professional that you are using this medicine. Be careful brushing or flossing your teeth or using a toothpick because you may get an infection or bleed more easily. If you have any dental work done, tell your dentist you are receiving this medicine. What side effects may I notice from receiving this medication? Side effects that you should report to your doctor or health care professional as soon as possible: allergic reactions like skin rash, itching or hives, swelling of the face, lips, or tongue breathing problems nausea, vomiting signs and symptoms of bleeding such as bloody or black, tarry stools; red or dark brown urine; spitting up blood or brown material that looks like coffee grounds; red spots on the skin; unusual bruising or bleeding from the eyes, gums, or nose signs and symptoms of heart failure like fast, irregular heartbeat, sudden  weight gain; swelling of the ankles, feet, hands signs and symptoms of infection like fever; chills; cough; sore throat; pain or trouble passing urine signs and symptoms of kidney injury like trouble passing urine or change in the amount of urine signs and symptoms of liver injury like dark yellow or brown urine; general ill feeling or flu-like symptoms; light-colored stools; loss of appetite; nausea; right upper belly pain; unusually weak or tired; yellowing of the eyes or skin Side effects that usually do not require medical attention (report to your doctor or health care professional if they continue or are bothersome): confusion decreased hearing diarrhea facial flushing hair loss headache loss of appetite missed menstrual periods signs and symptoms of low red blood cells or anemia such as unusually weak or tired; feeling faint or lightheaded; falls skin discoloration This list may not describe all possible side effects. Call your doctor for  medical advice about side effects. You may report side effects to FDA at 1-800-FDA-1088. Where should I keep my medication? This drug is given in a hospital or clinic and will not be stored at home. NOTE: This sheet is a summary. It may not cover all possible information. If you have questions about this medicine, talk to your doctor, pharmacist, or health care provider.  2022 Elsevier/Gold Standard (2021-08-08 00:00:00)

## 2022-01-29 NOTE — Progress Notes (Signed)
Patient presents for treatment. RN assessment completed along with the following:  Labs/vitals reviewed - Yes, and within treatment parameters.   Weight within 10% of previous measurement - Yes Informed consent completed and reflects current therapy/intent - Yes, on date 01/29/2022             Provider progress note reviewed - Patient not seen by provider today. Most recent note dated 01/25/2022 reviewed. Treatment/Antibody/Supportive plan reviewed - Yes, and there are no adjustments needed for today's treatment. S&H and other orders reviewed - Yes, and there are no additional orders identified. Previous treatment date reviewed - Yes, and the appropriate amount of time has elapsed between treatments. Clinic Hand Off Received from - No.  Patient to proceed with treatment.

## 2022-01-30 ENCOUNTER — Telehealth: Payer: Self-pay

## 2022-01-30 NOTE — Telephone Encounter (Signed)
24 Hour Call Back  Follow up call made to patient post first time Kyprolis/Cytoxan. Patient reported doing well and denied any concerns at this time. He knows to call the clinic with any questions or concerns.

## 2022-02-01 ENCOUNTER — Other Ambulatory Visit: Payer: Self-pay | Admitting: Internal Medicine

## 2022-02-02 ENCOUNTER — Ambulatory Visit (HOSPITAL_COMMUNITY): Payer: Medicare PPO

## 2022-02-02 ENCOUNTER — Telehealth: Payer: Self-pay

## 2022-02-02 ENCOUNTER — Encounter: Payer: Self-pay | Admitting: Oncology

## 2022-02-02 ENCOUNTER — Encounter (HOSPITAL_COMMUNITY): Payer: Self-pay

## 2022-02-02 ENCOUNTER — Inpatient Hospital Stay (HOSPITAL_COMMUNITY): Admission: RE | Admit: 2022-02-02 | Payer: Medicare PPO | Source: Ambulatory Visit

## 2022-02-02 NOTE — Telephone Encounter (Signed)
My Chart message from Pt's wife stating Pt had to cancel appointment for port placement because Pt had a fever of 101 and went to [facility] ER for cellulitis of the left leg.Pt was given Vancomycin IV and she couldn't remember the other antibiotic given IV. Pt was also given keflex 500 mg and doxycycline to take oral. Pt's wife concerned about appointment for Monday. Per Dr Benay Spice Pt should still come in for treatment Monday. ?

## 2022-02-05 ENCOUNTER — Telehealth: Payer: Self-pay

## 2022-02-05 ENCOUNTER — Encounter: Payer: Self-pay | Admitting: Nurse Practitioner

## 2022-02-05 ENCOUNTER — Other Ambulatory Visit: Payer: Self-pay | Admitting: Oncology

## 2022-02-05 ENCOUNTER — Encounter: Payer: Self-pay | Admitting: *Deleted

## 2022-02-05 ENCOUNTER — Ambulatory Visit (INDEPENDENT_AMBULATORY_CARE_PROVIDER_SITE_OTHER): Payer: Medicare PPO

## 2022-02-05 ENCOUNTER — Other Ambulatory Visit: Payer: Self-pay

## 2022-02-05 ENCOUNTER — Inpatient Hospital Stay: Payer: Medicare PPO

## 2022-02-05 ENCOUNTER — Inpatient Hospital Stay: Payer: Medicare PPO | Admitting: Nurse Practitioner

## 2022-02-05 ENCOUNTER — Inpatient Hospital Stay: Payer: Medicare PPO | Attending: Nurse Practitioner

## 2022-02-05 VITALS — BP 141/96 | HR 80 | Temp 97.8°F | Resp 18 | Ht 74.0 in | Wt 271.8 lb

## 2022-02-05 DIAGNOSIS — R509 Fever, unspecified: Secondary | ICD-10-CM | POA: Insufficient documentation

## 2022-02-05 DIAGNOSIS — C9000 Multiple myeloma not having achieved remission: Secondary | ICD-10-CM

## 2022-02-05 DIAGNOSIS — I82890 Acute embolism and thrombosis of other specified veins: Secondary | ICD-10-CM

## 2022-02-05 DIAGNOSIS — Z5112 Encounter for antineoplastic immunotherapy: Secondary | ICD-10-CM | POA: Diagnosis not present

## 2022-02-05 DIAGNOSIS — M7989 Other specified soft tissue disorders: Secondary | ICD-10-CM | POA: Diagnosis not present

## 2022-02-05 DIAGNOSIS — Z5111 Encounter for antineoplastic chemotherapy: Secondary | ICD-10-CM | POA: Diagnosis present

## 2022-02-05 DIAGNOSIS — F32A Depression, unspecified: Secondary | ICD-10-CM | POA: Diagnosis not present

## 2022-02-05 DIAGNOSIS — M545 Low back pain, unspecified: Secondary | ICD-10-CM | POA: Insufficient documentation

## 2022-02-05 DIAGNOSIS — I10 Essential (primary) hypertension: Secondary | ICD-10-CM | POA: Diagnosis not present

## 2022-02-05 DIAGNOSIS — R110 Nausea: Secondary | ICD-10-CM | POA: Insufficient documentation

## 2022-02-05 DIAGNOSIS — Z79899 Other long term (current) drug therapy: Secondary | ICD-10-CM | POA: Diagnosis not present

## 2022-02-05 DIAGNOSIS — G893 Neoplasm related pain (acute) (chronic): Secondary | ICD-10-CM | POA: Insufficient documentation

## 2022-02-05 DIAGNOSIS — R600 Localized edema: Secondary | ICD-10-CM | POA: Diagnosis not present

## 2022-02-05 DIAGNOSIS — G629 Polyneuropathy, unspecified: Secondary | ICD-10-CM | POA: Insufficient documentation

## 2022-02-05 DIAGNOSIS — L539 Erythematous condition, unspecified: Secondary | ICD-10-CM | POA: Diagnosis not present

## 2022-02-05 DIAGNOSIS — E119 Type 2 diabetes mellitus without complications: Secondary | ICD-10-CM | POA: Insufficient documentation

## 2022-02-05 DIAGNOSIS — N289 Disorder of kidney and ureter, unspecified: Secondary | ICD-10-CM | POA: Diagnosis not present

## 2022-02-05 DIAGNOSIS — L03116 Cellulitis of left lower limb: Secondary | ICD-10-CM | POA: Diagnosis not present

## 2022-02-05 LAB — CBC WITH DIFFERENTIAL (CANCER CENTER ONLY)
Abs Immature Granulocytes: 0.07 10*3/uL (ref 0.00–0.07)
Basophils Absolute: 0 10*3/uL (ref 0.0–0.1)
Basophils Relative: 0 %
Eosinophils Absolute: 0.1 10*3/uL (ref 0.0–0.5)
Eosinophils Relative: 1 %
HCT: 47.1 % (ref 39.0–52.0)
Hemoglobin: 15.5 g/dL (ref 13.0–17.0)
Immature Granulocytes: 1 %
Lymphocytes Relative: 11 %
Lymphs Abs: 0.8 10*3/uL (ref 0.7–4.0)
MCH: 29.1 pg (ref 26.0–34.0)
MCHC: 32.9 g/dL (ref 30.0–36.0)
MCV: 88.5 fL (ref 80.0–100.0)
Monocytes Absolute: 0.7 10*3/uL (ref 0.1–1.0)
Monocytes Relative: 9 %
Neutro Abs: 5.7 10*3/uL (ref 1.7–7.7)
Neutrophils Relative %: 78 %
Platelet Count: 217 10*3/uL (ref 150–400)
RBC: 5.32 MIL/uL (ref 4.22–5.81)
RDW: 14.1 % (ref 11.5–15.5)
WBC Count: 7.3 10*3/uL (ref 4.0–10.5)
nRBC: 0 % (ref 0.0–0.2)

## 2022-02-05 LAB — CMP (CANCER CENTER ONLY)
ALT: 34 U/L (ref 0–44)
AST: 25 U/L (ref 15–41)
Albumin: 4.5 g/dL (ref 3.5–5.0)
Alkaline Phosphatase: 49 U/L (ref 38–126)
Anion gap: 12 (ref 5–15)
BUN: 20 mg/dL (ref 6–20)
CO2: 25 mmol/L (ref 22–32)
Calcium: 9.7 mg/dL (ref 8.9–10.3)
Chloride: 97 mmol/L — ABNORMAL LOW (ref 98–111)
Creatinine: 1.25 mg/dL — ABNORMAL HIGH (ref 0.61–1.24)
GFR, Estimated: >60 mL/min (ref >60)
Glucose, Bld: 229 mg/dL — ABNORMAL HIGH (ref 70–99)
Potassium: 4.1 mmol/L (ref 3.5–5.1)
Sodium: 134 mmol/L — ABNORMAL LOW (ref 135–145)
Total Bilirubin: 0.4 mg/dL (ref 0.3–1.2)
Total Protein: 7.5 g/dL (ref 6.5–8.1)

## 2022-02-05 MED ORDER — APIXABAN 5 MG PO TABS: 5.0000 mg | ORAL_TABLET | Freq: Two times a day (BID) | ORAL | 2 refills | Status: DC

## 2022-02-05 MED ORDER — DEXTROSE 5 % IV SOLN
27.0000 mg/m2 | Freq: Once | INTRAVENOUS | Status: AC
  Administered 2022-02-05: 60 mg via INTRAVENOUS
  Filled 2022-02-05: qty 30

## 2022-02-05 MED ORDER — SODIUM CHLORIDE 0.9 % IV SOLN
Freq: Once | INTRAVENOUS | Status: AC
  Administered 2022-02-05: 250 mL via INTRAVENOUS

## 2022-02-05 MED ORDER — DEXAMETHASONE 4 MG PO TABS
20.0000 mg | ORAL_TABLET | Freq: Once | ORAL | Status: AC
  Administered 2022-02-05: 20 mg via ORAL
  Filled 2022-02-05: qty 5

## 2022-02-05 MED ORDER — SODIUM CHLORIDE 0.9 % IV SOLN
500.0000 mg | Freq: Once | INTRAVENOUS | Status: AC
  Administered 2022-02-05: 500 mg via INTRAVENOUS
  Filled 2022-02-05: qty 25

## 2022-02-05 MED ORDER — SODIUM CHLORIDE 0.9 % IV SOLN: Freq: Once | INTRAVENOUS | Status: AC

## 2022-02-05 NOTE — Progress Notes (Signed)
Patient presents for treatment. RN assessment completed along with the following: ? ?Labs/vitals reviewed - Yes, and within treatment parameters.   ?Weight within 10% of previous measurement - Yes ?Informed consent completed and reflects current therapy/intent - Yes, on date 01/29/22             ?Provider progress note reviewed - Yes, today's provider note was reviewed. ?Treatment/Antibody/Supportive plan reviewed - Yes, and there are no adjustments needed for today's treatment. ?S&H and other orders reviewed - Yes, and there are no additional orders identified. ?Previous treatment date reviewed - Yes, and the appropriate amount of time has elapsed between treatments. ?Clinic Hand Off Received from - Cristy Friedlander, RN ? ? ?Patient to proceed with treatment.  ? ?

## 2022-02-05 NOTE — Patient Instructions (Signed)
[facility]   Discharge Instructions: Thank you for choosing [facility] to provide your oncology and hematology care.   If you have a lab appointment with the [facility], please go directly to the [facility] and check in at the registration area.   Wear comfortable clothing and clothing appropriate for easy access to any Portacath or PICC line.   We strive to give you quality time with your provider. You may need to reschedule your appointment if you arrive late (15 or more minutes).  Arriving late affects you and other patients whose appointments are after yours.  Also, if you miss three or more appointments without notifying the office, you may be dismissed from the clinic at the providers discretion.      For prescription refill requests, have your pharmacy contact our office and allow 72 hours for refills to be completed.    Today you received the following chemotherapy and/or immunotherapy agents Cytoxan, Kyprolis      To help prevent nausea and vomiting after your treatment, we encourage you to take your nausea medication as directed.  BELOW ARE SYMPTOMS THAT SHOULD BE REPORTED IMMEDIATELY: *FEVER GREATER THAN 100.4 F (38 C) OR HIGHER *CHILLS OR SWEATING *NAUSEA AND VOMITING THAT IS NOT CONTROLLED WITH YOUR NAUSEA MEDICATION *UNUSUAL SHORTNESS OF BREATH *UNUSUAL BRUISING OR BLEEDING *URINARY PROBLEMS (pain or burning when urinating, or frequent urination) *BOWEL PROBLEMS (unusual diarrhea, constipation, pain near the anus) TENDERNESS IN MOUTH AND THROAT WITH OR WITHOUT PRESENCE OF ULCERS (sore throat, sores in mouth, or a toothache) UNUSUAL RASH, SWELLING OR PAIN  UNUSUAL VAGINAL DISCHARGE OR ITCHING   Items with * indicate a potential emergency and should be followed up as soon as possible or go to the Emergency Department if any problems should occur.  Please show the CHEMOTHERAPY ALERT CARD or IMMUNOTHERAPY ALERT CARD at  check-in to the Emergency Department and triage nurse.  Should you have questions after your visit or need to cancel or reschedule your appointment, please contact [facility]  Dept: [phone]  and follow the prompts.  Office hours are 8:00 a.m. to 4:30 p.m. Monday - Friday. Please note that voicemails left after 4:00 p.m. may not be returned until the following business day.  We are closed weekends and major holidays. You have access to a nurse at all times for urgent questions. Please call the main number to the clinic Dept: [phone] and follow the prompts.   For any non-urgent questions, you may also contact your provider using MyChart. We now offer e-Visits for anyone 22 and older to request care online for non-urgent symptoms. For details visit [email or web address].   Also download the MyChart app! Go to the app store, search "MyChart", open the app, select [facility] , and log in with your MyChart username and password.  Due to Covid, a mask is required upon entering the hospital/clinic. If you do not have a mask, one will be given to you upon arrival. For doctor visits, patients may have 1 support person aged 38 or older with them. For treatment visits, patients cannot have anyone with them due to current Covid guidelines and our immunocompromised population.   Cyclophosphamide Injection What is this medication? CYCLOPHOSPHAMIDE (sye kloe FOSS fa mide) is a chemotherapy drug. It slows the growth of cancer cells. This medicine is used to treat many types of cancer like lymphoma, myeloma, leukemia, breast cancer, and ovarian cancer, to name a few. This  medicine may be used for other purposes; ask your health care provider or pharmacist if you have questions. COMMON BRAND NAME(S): Cytoxan, Neosar What should I tell my care team before I take this medication? They need to know if you have any of these conditions: heart disease history of irregular  heartbeat infection kidney disease liver disease low blood counts, like white cells, platelets, or red blood cells on hemodialysis recent or ongoing radiation therapy scarring or thickening of the lungs trouble passing urine an unusual or allergic reaction to cyclophosphamide, other medicines, foods, dyes, or preservatives pregnant or trying to get pregnant breast-feeding How should I use this medication? This drug is usually given as an injection into a vein or muscle or by infusion into a vein. It is administered in a hospital or clinic by a specially trained health care professional. Talk to your pediatrician regarding the use of this medicine in children. Special care may be needed. Overdosage: If you think you have taken too much of this medicine contact a poison control center or emergency room at once. NOTE: This medicine is only for you. Do not share this medicine with others. What if I miss a dose? It is important not to miss your dose. Call your doctor or health care professional if you are unable to keep an appointment. What may interact with this medication? amphotericin B azathioprine certain antivirals for HIV or hepatitis certain medicines for blood pressure, heart disease, irregular heart beat certain medicines that treat or prevent blood clots like warfarin certain other medicines for cancer cyclosporine etanercept indomethacin medicines that relax muscles for surgery medicines to increase blood counts metronidazole This list may not describe all possible interactions. Give your health care provider a list of all the medicines, herbs, non-prescription drugs, or dietary supplements you use. Also tell them if you smoke, drink alcohol, or use illegal drugs. Some items may interact with your medicine. What should I watch for while using this medication? Your condition will be monitored carefully while you are receiving this medicine. You may need blood work done while  you are taking this medicine. Drink water or other fluids as directed. Urinate often, even at night. Some products may contain alcohol. Ask your health care professional if this medicine contains alcohol. Be sure to tell all health care professionals you are taking this medicine. Certain medicines, like metronidazole and disulfiram, can cause an unpleasant reaction when taken with alcohol. The reaction includes flushing, headache, nausea, vomiting, sweating, and increased thirst. The reaction can last from 30 minutes to several hours. Do not become pregnant while taking this medicine or for 1 year after stopping it. Women should inform their health care professional if they wish to become pregnant or think they might be pregnant. Men should not father a child while taking this medicine and for 4 months after stopping it. There is potential for serious side effects to an unborn child. Talk to your health care professional for more information. Do not breast-feed an infant while taking this medicine or for 1 week after stopping it. This medicine has caused ovarian failure in some women. This medicine may make it more difficult to get pregnant. Talk to your health care professional if you are concerned about your fertility. This medicine has caused decreased sperm counts in some men. This may make it more difficult to father a child. Talk to your health care professional if you are concerned about your fertility. Call your health care professional for advice if you get  a fever, chills, or sore throat, or other symptoms of a cold or flu. Do not treat yourself. This medicine decreases your body's ability to fight infections. Try to avoid being around people who are sick. Avoid taking medicines that contain aspirin, acetaminophen, ibuprofen, naproxen, or ketoprofen unless instructed by your health care professional. These medicines may hide a fever. Talk to your health care professional about your risk of cancer.  You may be more at risk for certain types of cancer if you take this medicine. If you are going to need surgery or other procedure, tell your health care professional that you are using this medicine. Be careful brushing or flossing your teeth or using a toothpick because you may get an infection or bleed more easily. If you have any dental work done, tell your dentist you are receiving this medicine. What side effects may I notice from receiving this medication? Side effects that you should report to your doctor or health care professional as soon as possible: allergic reactions like skin rash, itching or hives, swelling of the face, lips, or tongue breathing problems nausea, vomiting signs and symptoms of bleeding such as bloody or black, tarry stools; red or dark brown urine; spitting up blood or brown material that looks like coffee grounds; red spots on the skin; unusual bruising or bleeding from the eyes, gums, or nose signs and symptoms of heart failure like fast, irregular heartbeat, sudden weight gain; swelling of the ankles, feet, hands signs and symptoms of infection like fever; chills; cough; sore throat; pain or trouble passing urine signs and symptoms of kidney injury like trouble passing urine or change in the amount of urine signs and symptoms of liver injury like dark yellow or brown urine; general ill feeling or flu-like symptoms; light-colored stools; loss of appetite; nausea; right upper belly pain; unusually weak or tired; yellowing of the eyes or skin Side effects that usually do not require medical attention (report to your doctor or health care professional if they continue or are bothersome): confusion decreased hearing diarrhea facial flushing hair loss headache loss of appetite missed menstrual periods signs and symptoms of low red blood cells or anemia such as unusually weak or tired; feeling faint or lightheaded; falls skin discoloration This list may not describe  all possible side effects. Call your doctor for medical advice about side effects. You may report side effects to FDA at 1-800-FDA-1088. Where should I keep my medication? This drug is given in a hospital or clinic and will not be stored at home. NOTE: This sheet is a summary. It may not cover all possible information. If you have questions about this medicine, talk to your doctor, pharmacist, or health care provider.  2022 Elsevier/Gold Standard (2021-08-08 00:00:00)  Carfilzomib injection What is this medication? CARFILZOMIB (kar FILZ oh mib) targets a specific protein within cancer cells and stops the cancer cells from growing. It is used to treat multiple myeloma. This medicine may be used for other purposes; ask your health care provider or pharmacist if you have questions. COMMON BRAND NAME(S): KYPROLIS What should I tell my care team before I take this medication? They need to know if you have any of these conditions: heart disease history of blood clots irregular heartbeat kidney disease liver disease lung or breathing disease an unusual or allergic reaction to carfilzomib, or other medicines, foods, dyes, or preservatives pregnant or trying to get pregnant breast-feeding How should I use this medication? This medicine is for injection or infusion into  a vein. It is given by a health care professional in a hospital or clinic setting. Talk to your pediatrician regarding the use of this medicine in children. Special care may be needed. Overdosage: If you think you have taken too much of this medicine contact a poison control center or emergency room at once. NOTE: This medicine is only for you. Do not share this medicine with others. What if I miss a dose? It is important not to miss your dose. Call your doctor or health care professional if you are unable to keep an appointment. What may interact with this medication? Interactions are not expected. This list may not describe  all possible interactions. Give your health care provider a list of all the medicines, herbs, non-prescription drugs, or dietary supplements you use. Also tell them if you smoke, drink alcohol, or use illegal drugs. Some items may interact with your medicine. What should I watch for while using this medication? Your condition will be monitored while you are receiving this medicine. You may need blood work done while you are taking this medicine. Do not become pregnant while taking this medicine or for 6 months after stopping it. Women should inform their health care provider if they wish to become pregnant or think they might be pregnant. Men should not father a child while taking this medicine and for 3 months after stopping it. There is a potential for serious side effects to an unborn child. Talk to your health care provider for more information. Do not breast-feed an infant while taking this medicine or for 2 weeks after stopping it. Check with your health care provider if you have severe diarrhea, nausea, and vomiting, or if you sweat a lot. The loss of too much body fluid may make it dangerous for you to take this medicine. You may get drowsy or dizzy. Do not drive, use machinery, or do anything that needs mental alertness until you know how this medicine affects you. Do not stand up or sit up quickly, especially if you are an older patient. This reduces the risk of dizzy or fainting spells. What side effects may I notice from receiving this medication? Side effects that you should report to your doctor or health care professional as soon as possible: allergic reactions like skin rash, itching or hives, swelling of the face, lips, or tongue confusion dizziness feeling faint or lightheaded fever or chills palpitations seizures signs and symptoms of bleeding such as bloody or black, tarry stools; red or dark-brown urine; spitting up blood or brown material that looks like coffee grounds; red  spots on the skin; unusual bruising or bleeding including from the eye, gums, or nose signs and symptoms of a blood clot such as breathing problems; changes in vision; chest pain; severe, sudden headache; pain, swelling, warmth in the leg; trouble speaking; sudden numbness or weakness of the face, arm or leg signs and symptoms of kidney injury like trouble passing urine or change in the amount of urine signs and symptoms of liver injury like dark yellow or brown urine; general ill feeling or flu-like symptoms; light-colored stools; loss of appetite; nausea; right upper belly pain; unusually weak or tired; yellowing of the eyes or skin Side effects that usually do not require medical attention (report to your doctor or health care professional if they continue or are bothersome): back pain cough diarrhea headache muscle cramps trouble sleeping vomiting This list may not describe all possible side effects. Call your doctor for medical advice about  side effects. You may report side effects to FDA at 1-800-FDA-1088. Where should I keep my medication? This drug is given in a hospital or clinic and will not be stored at home. NOTE: This sheet is a summary. It may not cover all possible information. If you have questions about this medicine, talk to your doctor, pharmacist, or health care provider.  2022 Elsevier/Gold Standard (2020-12-29 00:00:00)

## 2022-02-05 NOTE — Telephone Encounter (Signed)
Called and spoke with [facility] to ask if the patient needs an authorization to fill Eliquis,  Pharmacy stated he does not need an authorization and the cost will be $46.00. I inform the patient the cost and it will be ready around 5. Patient voiced understanding ?

## 2022-02-05 NOTE — Progress Notes (Signed)
[place] OFFICE PROGRESS NOTE   Diagnosis: Multiple myeloma  INTERVAL HISTORY:   Steve Carter returns as scheduled.  He completed cycle 1 day 1 carfilzomib/Cytoxan/Decadron 01/29/2022.  He denies nausea/vomiting.  No mouth sores.  No diarrhea.  Pain is stable to improved.  He continues Percocet as needed.  He developed fever and chills last week.  He was seen in an emergency department, diagnosed with left leg cellulitis.  He is completing antibiotics.  No further fevers.  Port-A-Cath placement was canceled due to the infection.  Objective:  Vital signs in last 24 hours:  Blood pressure (!) 141/96, pulse 80, temperature 97.8 F (36.6 C), temperature source Oral, resp. rate 18, height $RemoveBe'6\' 2"'gNDonrYxl$  (1.88 m), weight 271 lb 12.8 oz (123.3 kg), SpO2 95 %.    HEENT: No thrush or ulcers. Resp: Lungs clear bilaterally. Cardio: Regular rate and rhythm. GI: No hepatosplenomegaly. Vascular: Bilateral lower leg edema left greater than right.  Erythema left pretibial region. Skin: Callus plantar surface left great toe.   Lab Results:  Lab Results  Component Value Date   WBC 7.3 02/05/2022   HGB 15.5 02/05/2022   HCT 47.1 02/05/2022   MCV 88.5 02/05/2022   PLT 217 02/05/2022   NEUTROABS 5.7 02/05/2022    Imaging:  No results found.  Medications: I have reviewed the patient's current medications.  Assessment/Plan: Multiple myeloma- IgG lambda serum monoclonal protein, elevated serum lambda light chains Bone survey 10/11/2017-lytic lesions noted in the skull, right iliac, left second rib, and mottled appearance of the proximal femurs/pelvis Bone marrow biopsy 10/14/2017-hypercellular marrow with plasma cell neoplasm, 82% plasma cells, lambda light chain restricted, hyperdiploid with gains of chromosomes 3, 5, 7, 9, and 11.  FISH panel positive for gain of ATM (+11) Cycle 1 RVD 10/18/2017 (Revlimid started 10/23/2017) Cycle 2 RVD 11/15/2017 Cycle 3 RVD 12/13/2017 Cycle 4  RVD 01/14/2018 Cycle 5 of RVD 02/14/2018 (Revlimid given for 7 days and 1 week of Velcade), therapy held beginning 02/21/2018 secondary to plan for stem cell therapy Bone marrow biopsy 02/20/2018, 1-2% plasma cell PET scan 02/20/2018, no malignant range activity above background, numerous lytic lesions throughout the axial and appendicular skeleton,, left iliac wing fracture Melphalan, 200 mg/m on 03/24/2018 Stem cell infusion 03/25/2018 Restaging at [facility] 07/09/2018: Normal lambda light chains, no serum M spike, bone marrow biopsy negative for myeloma, less than 1% plasma cells PET scan at [facility] 07/09/2018- lytic bone lesions, no malignant range activity Initiation of maintenance Revlimid, 10 mg, 21/28 days 08/15/2018 Cycle 2 maintenance Revlimid 09/12/2018 Cycle 3 maintenance Revlimid 10/10/2018 Revlimid placed on hold 06/15/2019 due to presyncopal/syncopal episodes and diarrhea Revlimid resumed 5 mg 21 days on/7 days off following office visit 06/29/2019 Revlimid placed on hold 07/14/2019 Revlimid resumed 08/21/2019 Bone survey 09/09/2019- no acute findings or clear explanation for right buttock pain.  Lucent lesions in the right pelvis are stable without pathologic fracture.  Evidence of healing lytic lesions in the right scapula, cervical spine spinous processes and left L4 transverse process.  Possible mild progression of lytic lesions within the L1 and L4 vertebral bodies.  Stable lytic lesions in the calvarium. PET scan 09/25/2019-new FDG avid bone lesions in the right humeral neck and sacrum similar remaining lytic lesions with FDG activity below background Bone marrow biopsy at [facility] on 10/01/2019-5% plasma cells on the bone marrow biopsy suboptimal sample  Cycle 1 daratumumab, pomalidomide, Decadron 10/19/2019 Zometa 10/19/2019 Cycle 2 daratumumab, pomalidomide, Decadron 11/16/2019 (he will begin the  pomalidomide 11/17/2019) Cycle 3 daratumumab, pomalidomide, and Decadron  12/14/2019 Cycle 4 daratumumab, pomalidomide, and Decadron 01/11/2020 Cycle 5 daratumumab, pomalidomide, Decadron 02/03/2020 (he began pomalidomide on 02/06/2020) Cycle 6 daratumumab, pomalidomide, Decadron 03/02/2020 (pomalidomide starting 03/12/2020) Cycle 7 daratumumab, pomalidomide, Decadron 03/29/2020 (pomalidomide scheduled to start 04/15/2020) Cycle 8 daratumumab, pomalidomide, Decadron 05/04/2020 (pomalidomide started 05/14/2020) Cycle 9 daratumumab-monthly 06/15/2020, pomalidomide held Cycle 10 daratumumab 07/13/2020, pomalidomide 2 mg 21 days beginning 07/15/2020 Cycle 11 daratumumab 08/10/2020, pomalidomide 2 mg 21 days beginning 08/12/2020 Bone marrow biopsy 09/06/2020-hypocellular bone marrow with a relative erythroid hyperplasia and no increase in plasma cells; MRD 0.0050% Cycle 12 daratumumab 09/07/2020, pomalidomide 2 mg 21 days beginning 09/09/2020 Cycle 13 daratumumab 10/11/2020, pomalidomide 2 mg 21 days beginning 10/10/2020 Cycle 14 daratumumab 11/08/2020, pomalidomide reduced to 1 mg 21 days Cycle 15 daratumumab 12/14/2020, Pomalidomide 1 mg 21 days beginning 12/13/2020 Cycle 16 daratumumab 01/12/2021, pomalidomide 1 mg 21 days beginning 01/11/2021 Cycle 17 daratumumab 02/10/2021, pomalidomide 1 mg 21 days beginning 02/09/2021 Cycle 18 daratumumab 03/10/2021, pomalidomide 1 mg 21 days beginning 03/11/2021 Cycle 19 daratumumab 04/07/2021, Pomalidomide 1 mg 21 days beginning 04/08/2021 Cycle 20 daratumumab 05/09/2021, Pomalidomide 1 mg 21 days beginning 05/12/2021 Cycle 21 daratumumab 06/07/2021, pomalidomide held secondary to progressive neuropathy and diarrhea Cycle 22 daratumumab 07/05/2021, pomalidomide held secondary to neuropathy Cycle 23 daratumumab 08/02/2021, Pomalidomide remains on hold Cycle 24 daratumumab 08/30/2021, pomalidomide on hold Cycle 25 daratumumab 09/27/2021, pomalidomide on hold Cycle 26 daratumumab 10/23/2021, pomalidomide on hold Cycle 27 daratumumab 11/20/2021, pomalidomide on hold Cycle 28  daratumumab 12/18/2021, Pomalidomide on hold Increased serum M spike and serum free lambda light chains December 2022 PET 01/12/2022-multiple lytic lesions with mild hypermetabolism, most prominent hypermetabolic lesion at the sacrum Cycle 1 carfilzomib/Cytoxan/Decadron 01/29/2022   2.  Pain secondary to multiple myeloma involving the spine and pelvis-resolved MRI of the lumbar spine 10/02/2018- numerous rounded foci in the vertebral bodies, hypertrophy of the L4 transverse process   3.  Hypertension-losartan dose increased 11/15/2017   4.  Depression-improved with Wellbutrin beginning September 2020   5.  Altered mental status-improved depression related? 6. Diabetes 7.  Recurrent episodes of fall/syncope-etiology unclear-evaluated by neurology at [facility], MRI brain 01/25/2020 with abnormal signal at the left superior frontal gyrus-potentially representing a low-grade glioma, scattered foci of increased T2 white matter signal-likely related to chronic small vessel disease Stereotactic brain biopsy at [facility] 03/14/2020-gliosis, no evidence of malignancy   8.  Peripheral neuropathy-gabapentin started 07/07/2020; reports increase in symptoms 06/07/2021-pomalidomide placed on hold; reports increase in symptoms 08/02/2021   9.  Renal insufficiency  10.  Left lower extremity cellulitis 02/01/2022  11.  Occlusive clot in the greater saphenous vein 02/05/2022-Eliquis initiated    Disposition: Steve Carter appears stable.  He completed cycle 1 day 1 carfilzomib/Cytoxan/Decadron 01/29/2022.  He seems to have tolerated well.  Plan to proceed with day 8 today as scheduled.  CBC and chemistry panel reviewed.  Labs adequate to proceed as above.  He was diagnosed with left lower extremity cellulitis 02/01/2022.  He is completing antibiotics.  We referred him for a venous Doppler study.  He has occlusive clot in the greater saphenous vein.  We reviewed the result with Steve Carter and his wife.  They  understand the recommendation is to initiate anticoagulation.  We discussed risks and benefits of anticoagulation.  They are in agreement with beginning a blood thinner.  Prescription for Eliquis 5 mg twice daily sent to his pharmacy.  He will return for cycle 1 day  15 next week.  We will see him in follow-up prior to beginning cycle 2 on 02/26/2022.  Patient seen with Dr. Benay Spice.    [name] ANP/GNP-BC   02/05/2022  9:39 AM This was a shared visit with [name].  Steve Carter was interviewed and examined.  He tolerated the first treatment with Cytoxan and carfilzomib well.  He will complete week #2 today.  He has been diagnosed with a left lower extremity cellulitis.  We referred him for a Doppler of the left leg.  This confirms a clot in the greater saphenous vein.  He is at risk for extension of the superficial thrombosis.  I recommend anticoagulation therapy.  We prescribed apixaban.  He will return for an office visit and carfilzomib/Cytoxan/Decadron as scheduled next week.  I was present for greater than 50% of today's visit.  I performed medical decision making.  Julieanne Manson, MD

## 2022-02-05 NOTE — Progress Notes (Signed)
Patient seen by Dr. Benay Spice today ? ?Vitals are within treatment parameters. ? ?Labs reviewed by [name], NP and are within treatment parameters. ? ?Per physician team, patient is ready for treatment and there are NO modifications to the treatment plan.  ?Will need doppler today after treatment. ?

## 2022-02-11 ENCOUNTER — Other Ambulatory Visit: Payer: Self-pay | Admitting: Oncology

## 2022-02-12 ENCOUNTER — Inpatient Hospital Stay: Payer: Medicare PPO

## 2022-02-12 ENCOUNTER — Other Ambulatory Visit: Payer: Self-pay | Admitting: Nurse Practitioner

## 2022-02-12 ENCOUNTER — Other Ambulatory Visit: Payer: Self-pay

## 2022-02-12 VITALS — BP 129/80 | HR 67 | Temp 97.7°F | Resp 20 | Ht 74.0 in | Wt 268.2 lb

## 2022-02-12 DIAGNOSIS — C9000 Multiple myeloma not having achieved remission: Secondary | ICD-10-CM

## 2022-02-12 DIAGNOSIS — Z5112 Encounter for antineoplastic immunotherapy: Secondary | ICD-10-CM | POA: Diagnosis not present

## 2022-02-12 LAB — CMP (CANCER CENTER ONLY)
ALT: 19 U/L (ref 0–44)
AST: 13 U/L — ABNORMAL LOW (ref 15–41)
Albumin: 4.4 g/dL (ref 3.5–5.0)
Alkaline Phosphatase: 60 U/L (ref 38–126)
Anion gap: 11 (ref 5–15)
BUN: 33 mg/dL — ABNORMAL HIGH (ref 6–20)
CO2: 25 mmol/L (ref 22–32)
Calcium: 9.4 mg/dL (ref 8.9–10.3)
Chloride: 98 mmol/L (ref 98–111)
Creatinine: 1.36 mg/dL — ABNORMAL HIGH (ref 0.61–1.24)
GFR, Estimated: >60 mL/min (ref >60)
Glucose, Bld: 188 mg/dL — ABNORMAL HIGH (ref 70–99)
Potassium: 4.5 mmol/L (ref 3.5–5.1)
Sodium: 134 mmol/L — ABNORMAL LOW (ref 135–145)
Total Bilirubin: 0.3 mg/dL (ref 0.3–1.2)
Total Protein: 6.9 g/dL (ref 6.5–8.1)

## 2022-02-12 LAB — CBC WITH DIFFERENTIAL (CANCER CENTER ONLY)
Abs Immature Granulocytes: 0.11 10*3/uL — ABNORMAL HIGH (ref 0.00–0.07)
Basophils Absolute: 0 10*3/uL (ref 0.0–0.1)
Basophils Relative: 0 %
Eosinophils Absolute: 0.1 10*3/uL (ref 0.0–0.5)
Eosinophils Relative: 1 %
HCT: 47.8 % (ref 39.0–52.0)
Hemoglobin: 16 g/dL (ref 13.0–17.0)
Immature Granulocytes: 1 %
Lymphocytes Relative: 10 %
Lymphs Abs: 1 10*3/uL (ref 0.7–4.0)
MCH: 29.6 pg (ref 26.0–34.0)
MCHC: 33.5 g/dL (ref 30.0–36.0)
MCV: 88.4 fL (ref 80.0–100.0)
Monocytes Absolute: 0.7 10*3/uL (ref 0.1–1.0)
Monocytes Relative: 7 %
Neutro Abs: 7.4 10*3/uL (ref 1.7–7.7)
Neutrophils Relative %: 81 %
Platelet Count: 246 10*3/uL (ref 150–400)
RBC: 5.41 MIL/uL (ref 4.22–5.81)
RDW: 14.1 % (ref 11.5–15.5)
WBC Count: 9.2 10*3/uL (ref 4.0–10.5)
nRBC: 0 % (ref 0.0–0.2)

## 2022-02-12 MED ORDER — ZOLEDRONIC ACID 4 MG/100ML IV SOLN
4.0000 mg | Freq: Once | INTRAVENOUS | Status: AC
  Administered 2022-02-12: 4 mg via INTRAVENOUS
  Filled 2022-02-12: qty 100

## 2022-02-12 MED ORDER — DEXAMETHASONE 4 MG PO TABS
20.0000 mg | ORAL_TABLET | Freq: Once | ORAL | Status: AC
  Administered 2022-02-12: 20 mg via ORAL
  Filled 2022-02-12: qty 5

## 2022-02-12 MED ORDER — SODIUM CHLORIDE 0.9 % IV SOLN: Freq: Once | INTRAVENOUS | Status: AC

## 2022-02-12 MED ORDER — OXYCODONE-ACETAMINOPHEN 5-325 MG PO TABS: 1.0000 | ORAL_TABLET | Freq: Four times a day (QID) | ORAL | 0 refills | Status: DC | PRN

## 2022-02-12 MED ORDER — OXYCODONE-ACETAMINOPHEN 10-325 MG PO TABS: 0.5000 | ORAL_TABLET | Freq: Four times a day (QID) | ORAL | 0 refills | Status: DC | PRN

## 2022-02-12 MED ORDER — DEXTROSE 5 % IV SOLN
36.0000 mg/m2 | Freq: Once | INTRAVENOUS | Status: AC
  Administered 2022-02-12: 80 mg via INTRAVENOUS
  Filled 2022-02-12: qty 30

## 2022-02-12 MED ORDER — SODIUM CHLORIDE 0.9 % IV SOLN
500.0000 mg | Freq: Once | INTRAVENOUS | Status: AC
  Administered 2022-02-12: 500 mg via INTRAVENOUS
  Filled 2022-02-12: qty 25

## 2022-02-12 NOTE — Progress Notes (Signed)
Patient presents for treatment. RN assessment completed along with the following: ? ?Labs/vitals reviewed - Yes, and within treatment parameters.   ?Weight within 10% of previous measurement - Yes ?Informed consent completed and reflects current therapy/intent - Yes, on date 01/29/22             ?Provider progress note reviewed - Patient not seen by provider today. Most recent note dated 02/05/22 reviewed. ?Treatment/Antibody/Supportive plan reviewed - Yes, and there are no adjustments needed for today's treatment. ?S&H and other orders reviewed - Yes, and there are no additional orders identified. ?Previous treatment date reviewed - Yes, and the appropriate amount of time has elapsed between treatments. ?Clinic Hand Off Received from - none ? ? ?Patient to proceed with treatment.  ? ?

## 2022-02-12 NOTE — Patient Instructions (Signed)
[place]   Discharge Instructions: Thank you for choosing [facility] to provide your oncology and hematology care.   If you have a lab appointment with the [facility], please go directly to the [facility] and check in at the registration area.   Wear comfortable clothing and clothing appropriate for easy access to any Portacath or PICC line.   We strive to give you quality time with your provider. You may need to reschedule your appointment if you arrive late (15 or more minutes).  Arriving late affects you and other patients whose appointments are after yours.  Also, if you miss three or more appointments without notifying the office, you may be dismissed from the clinic at the providers discretion.      For prescription refill requests, have your pharmacy contact our office and allow 72 hours for refills to be completed.    Today you received the following chemotherapy and/or immunotherapy agents Cytoxan, Kyprolis      To help prevent nausea and vomiting after your treatment, we encourage you to take your nausea medication as directed.  BELOW ARE SYMPTOMS THAT SHOULD BE REPORTED IMMEDIATELY: *FEVER GREATER THAN 100.4 F (38 C) OR HIGHER *CHILLS OR SWEATING *NAUSEA AND VOMITING THAT IS NOT CONTROLLED WITH YOUR NAUSEA MEDICATION *UNUSUAL SHORTNESS OF BREATH *UNUSUAL BRUISING OR BLEEDING *URINARY PROBLEMS (pain or burning when urinating, or frequent urination) *BOWEL PROBLEMS (unusual diarrhea, constipation, pain near the anus) TENDERNESS IN MOUTH AND THROAT WITH OR WITHOUT PRESENCE OF ULCERS (sore throat, sores in mouth, or a toothache) UNUSUAL RASH, SWELLING OR PAIN  UNUSUAL VAGINAL DISCHARGE OR ITCHING   Items with * indicate a potential emergency and should be followed up as soon as possible or go to the Emergency Department if any problems should occur.  Please show the CHEMOTHERAPY ALERT CARD or IMMUNOTHERAPY ALERT CARD at  check-in to the Emergency Department and triage nurse.  Should you have questions after your visit or need to cancel or reschedule your appointment, please contact [facility]  Dept: [phone]  and follow the prompts.  Office hours are 8:00 a.m. to 4:30 p.m. Monday - Friday. Please note that voicemails left after 4:00 p.m. may not be returned until the following business day.  We are closed weekends and major holidays. You have access to a nurse at all times for urgent questions. Please call the main number to the clinic Dept: [phone] and follow the prompts.   For any non-urgent questions, you may also contact your provider using MyChart. We now offer e-Visits for anyone 42 and older to request care online for non-urgent symptoms. For details visit [email or web address].   Also download the MyChart app! Go to the app store, search "MyChart", open the app, select [facility], and log in with your MyChart username and password.  Due to Covid, a mask is required upon entering the hospital/clinic. If you do not have a mask, one will be given to you upon arrival. For doctor visits, patients may have 1 support person aged 52 or older with them. For treatment visits, patients cannot have anyone with them due to current Covid guidelines and our immunocompromised population.   Cyclophosphamide Injection What is this medication? CYCLOPHOSPHAMIDE (sye kloe FOSS fa mide) is a chemotherapy drug. It slows the growth of cancer cells. This medicine is used to treat many types of cancer like lymphoma, myeloma, leukemia, breast cancer, and ovarian cancer, to name a few. This  medicine may be used for other purposes; ask your health care provider or pharmacist if you have questions. COMMON BRAND NAME(S): Cytoxan, Neosar What should I tell my care team before I take this medication? They need to know if you have any of these conditions: heart disease history of irregular  heartbeat infection kidney disease liver disease low blood counts, like white cells, platelets, or red blood cells on hemodialysis recent or ongoing radiation therapy scarring or thickening of the lungs trouble passing urine an unusual or allergic reaction to cyclophosphamide, other medicines, foods, dyes, or preservatives pregnant or trying to get pregnant breast-feeding How should I use this medication? This drug is usually given as an injection into a vein or muscle or by infusion into a vein. It is administered in a hospital or clinic by a specially trained health care professional. Talk to your pediatrician regarding the use of this medicine in children. Special care may be needed. Overdosage: If you think you have taken too much of this medicine contact a poison control center or emergency room at once. NOTE: This medicine is only for you. Do not share this medicine with others. What if I miss a dose? It is important not to miss your dose. Call your doctor or health care professional if you are unable to keep an appointment. What may interact with this medication? amphotericin B azathioprine certain antivirals for HIV or hepatitis certain medicines for blood pressure, heart disease, irregular heart beat certain medicines that treat or prevent blood clots like warfarin certain other medicines for cancer cyclosporine etanercept indomethacin medicines that relax muscles for surgery medicines to increase blood counts metronidazole This list may not describe all possible interactions. Give your health care provider a list of all the medicines, herbs, non-prescription drugs, or dietary supplements you use. Also tell them if you smoke, drink alcohol, or use illegal drugs. Some items may interact with your medicine. What should I watch for while using this medication? Your condition will be monitored carefully while you are receiving this medicine. You may need blood work done while  you are taking this medicine. Drink water or other fluids as directed. Urinate often, even at night. Some products may contain alcohol. Ask your health care professional if this medicine contains alcohol. Be sure to tell all health care professionals you are taking this medicine. Certain medicines, like metronidazole and disulfiram, can cause an unpleasant reaction when taken with alcohol. The reaction includes flushing, headache, nausea, vomiting, sweating, and increased thirst. The reaction can last from 30 minutes to several hours. Do not become pregnant while taking this medicine or for 1 year after stopping it. Women should inform their health care professional if they wish to become pregnant or think they might be pregnant. Men should not father a child while taking this medicine and for 4 months after stopping it. There is potential for serious side effects to an unborn child. Talk to your health care professional for more information. Do not breast-feed an infant while taking this medicine or for 1 week after stopping it. This medicine has caused ovarian failure in some women. This medicine may make it more difficult to get pregnant. Talk to your health care professional if you are concerned about your fertility. This medicine has caused decreased sperm counts in some men. This may make it more difficult to father a child. Talk to your health care professional if you are concerned about your fertility. Call your health care professional for advice if you get  a fever, chills, or sore throat, or other symptoms of a cold or flu. Do not treat yourself. This medicine decreases your body's ability to fight infections. Try to avoid being around people who are sick. Avoid taking medicines that contain aspirin, acetaminophen, ibuprofen, naproxen, or ketoprofen unless instructed by your health care professional. These medicines may hide a fever. Talk to your health care professional about your risk of cancer.  You may be more at risk for certain types of cancer if you take this medicine. If you are going to need surgery or other procedure, tell your health care professional that you are using this medicine. Be careful brushing or flossing your teeth or using a toothpick because you may get an infection or bleed more easily. If you have any dental work done, tell your dentist you are receiving this medicine. What side effects may I notice from receiving this medication? Side effects that you should report to your doctor or health care professional as soon as possible: allergic reactions like skin rash, itching or hives, swelling of the face, lips, or tongue breathing problems nausea, vomiting signs and symptoms of bleeding such as bloody or black, tarry stools; red or dark brown urine; spitting up blood or brown material that looks like coffee grounds; red spots on the skin; unusual bruising or bleeding from the eyes, gums, or nose signs and symptoms of heart failure like fast, irregular heartbeat, sudden weight gain; swelling of the ankles, feet, hands signs and symptoms of infection like fever; chills; cough; sore throat; pain or trouble passing urine signs and symptoms of kidney injury like trouble passing urine or change in the amount of urine signs and symptoms of liver injury like dark yellow or brown urine; general ill feeling or flu-like symptoms; light-colored stools; loss of appetite; nausea; right upper belly pain; unusually weak or tired; yellowing of the eyes or skin Side effects that usually do not require medical attention (report to your doctor or health care professional if they continue or are bothersome): confusion decreased hearing diarrhea facial flushing hair loss headache loss of appetite missed menstrual periods signs and symptoms of low red blood cells or anemia such as unusually weak or tired; feeling faint or lightheaded; falls skin discoloration This list may not describe  all possible side effects. Call your doctor for medical advice about side effects. You may report side effects to FDA at 1-800-FDA-1088. Where should I keep my medication? This drug is given in a hospital or clinic and will not be stored at home. NOTE: This sheet is a summary. It may not cover all possible information. If you have questions about this medicine, talk to your doctor, pharmacist, or health care provider.  2022 Elsevier/Gold Standard (2021-08-08 00:00:00)  Carfilzomib injection What is this medication? CARFILZOMIB (kar FILZ oh mib) targets a specific protein within cancer cells and stops the cancer cells from growing. It is used to treat multiple myeloma. This medicine may be used for other purposes; ask your health care provider or pharmacist if you have questions. COMMON BRAND NAME(S): KYPROLIS What should I tell my care team before I take this medication? They need to know if you have any of these conditions: heart disease history of blood clots irregular heartbeat kidney disease liver disease lung or breathing disease an unusual or allergic reaction to carfilzomib, or other medicines, foods, dyes, or preservatives pregnant or trying to get pregnant breast-feeding How should I use this medication? This medicine is for injection or infusion into  a vein. It is given by a health care professional in a hospital or clinic setting. Talk to your pediatrician regarding the use of this medicine in children. Special care may be needed. Overdosage: If you think you have taken too much of this medicine contact a poison control center or emergency room at once. NOTE: This medicine is only for you. Do not share this medicine with others. What if I miss a dose? It is important not to miss your dose. Call your doctor or health care professional if you are unable to keep an appointment. What may interact with this medication? Interactions are not expected. This list may not describe  all possible interactions. Give your health care provider a list of all the medicines, herbs, non-prescription drugs, or dietary supplements you use. Also tell them if you smoke, drink alcohol, or use illegal drugs. Some items may interact with your medicine. What should I watch for while using this medication? Your condition will be monitored while you are receiving this medicine. You may need blood work done while you are taking this medicine. Do not become pregnant while taking this medicine or for 6 months after stopping it. Women should inform their health care provider if they wish to become pregnant or think they might be pregnant. Men should not father a child while taking this medicine and for 3 months after stopping it. There is a potential for serious side effects to an unborn child. Talk to your health care provider for more information. Do not breast-feed an infant while taking this medicine or for 2 weeks after stopping it. Check with your health care provider if you have severe diarrhea, nausea, and vomiting, or if you sweat a lot. The loss of too much body fluid may make it dangerous for you to take this medicine. You may get drowsy or dizzy. Do not drive, use machinery, or do anything that needs mental alertness until you know how this medicine affects you. Do not stand up or sit up quickly, especially if you are an older patient. This reduces the risk of dizzy or fainting spells. What side effects may I notice from receiving this medication? Side effects that you should report to your doctor or health care professional as soon as possible: allergic reactions like skin rash, itching or hives, swelling of the face, lips, or tongue confusion dizziness feeling faint or lightheaded fever or chills palpitations seizures signs and symptoms of bleeding such as bloody or black, tarry stools; red or dark-brown urine; spitting up blood or brown material that looks like coffee grounds; red  spots on the skin; unusual bruising or bleeding including from the eye, gums, or nose signs and symptoms of a blood clot such as breathing problems; changes in vision; chest pain; severe, sudden headache; pain, swelling, warmth in the leg; trouble speaking; sudden numbness or weakness of the face, arm or leg signs and symptoms of kidney injury like trouble passing urine or change in the amount of urine signs and symptoms of liver injury like dark yellow or brown urine; general ill feeling or flu-like symptoms; light-colored stools; loss of appetite; nausea; right upper belly pain; unusually weak or tired; yellowing of the eyes or skin Side effects that usually do not require medical attention (report to your doctor or health care professional if they continue or are bothersome): back pain cough diarrhea headache muscle cramps trouble sleeping vomiting This list may not describe all possible side effects. Call your doctor for medical advice about  side effects. You may report side effects to FDA at 1-800-FDA-1088. Where should I keep my medication? This drug is given in a hospital or clinic and will not be stored at home. NOTE: This sheet is a summary. It may not cover all possible information. If you have questions about this medicine, talk to your doctor, pharmacist, or health care provider.  2022 Elsevier/Gold Standard (2020-12-29 00:00:00)

## 2022-02-14 ENCOUNTER — Encounter (HOSPITAL_BASED_OUTPATIENT_CLINIC_OR_DEPARTMENT_OTHER): Payer: Medicare PPO

## 2022-02-16 ENCOUNTER — Other Ambulatory Visit (HOSPITAL_COMMUNITY): Payer: Medicare PPO

## 2022-02-19 ENCOUNTER — Other Ambulatory Visit (HOSPITAL_COMMUNITY): Payer: Medicare PPO

## 2022-02-19 ENCOUNTER — Encounter: Payer: Self-pay | Admitting: Oncology

## 2022-02-25 ENCOUNTER — Other Ambulatory Visit: Payer: Self-pay | Admitting: Oncology

## 2022-02-26 ENCOUNTER — Inpatient Hospital Stay: Payer: Medicare PPO

## 2022-02-26 ENCOUNTER — Inpatient Hospital Stay: Payer: Medicare PPO | Admitting: Oncology

## 2022-02-26 ENCOUNTER — Encounter: Payer: Self-pay | Admitting: *Deleted

## 2022-02-26 ENCOUNTER — Other Ambulatory Visit: Payer: Self-pay

## 2022-02-26 VITALS — BP 118/72 | HR 69 | Temp 98.7°F | Resp 18 | Ht 74.0 in | Wt 270.0 lb

## 2022-02-26 DIAGNOSIS — C9000 Multiple myeloma not having achieved remission: Secondary | ICD-10-CM | POA: Diagnosis not present

## 2022-02-26 DIAGNOSIS — Z5112 Encounter for antineoplastic immunotherapy: Secondary | ICD-10-CM | POA: Diagnosis not present

## 2022-02-26 LAB — CMP (CANCER CENTER ONLY)
ALT: 26 U/L (ref 0–44)
AST: 17 U/L (ref 15–41)
Albumin: 4.2 g/dL (ref 3.5–5.0)
Alkaline Phosphatase: 59 U/L (ref 38–126)
Anion gap: 11 (ref 5–15)
BUN: 17 mg/dL (ref 6–20)
CO2: 26 mmol/L (ref 22–32)
Calcium: 9.3 mg/dL (ref 8.9–10.3)
Chloride: 99 mmol/L (ref 98–111)
Creatinine: 1.22 mg/dL (ref 0.61–1.24)
GFR, Estimated: >60 mL/min (ref >60)
Glucose, Bld: 225 mg/dL — ABNORMAL HIGH (ref 70–99)
Potassium: 4.2 mmol/L (ref 3.5–5.1)
Sodium: 136 mmol/L (ref 135–145)
Total Bilirubin: 0.3 mg/dL (ref 0.3–1.2)
Total Protein: 7 g/dL (ref 6.5–8.1)

## 2022-02-26 LAB — CBC WITH DIFFERENTIAL (CANCER CENTER ONLY)
Abs Immature Granulocytes: 0.2 10*3/uL — ABNORMAL HIGH (ref 0.00–0.07)
Basophils Absolute: 0.1 10*3/uL (ref 0.0–0.1)
Basophils Relative: 1 %
Eosinophils Absolute: 0.1 10*3/uL (ref 0.0–0.5)
Eosinophils Relative: 2 %
HCT: 43.2 % (ref 39.0–52.0)
Hemoglobin: 14.6 g/dL (ref 13.0–17.0)
Immature Granulocytes: 3 %
Lymphocytes Relative: 10 %
Lymphs Abs: 0.7 10*3/uL (ref 0.7–4.0)
MCH: 29.4 pg (ref 26.0–34.0)
MCHC: 33.8 g/dL (ref 30.0–36.0)
MCV: 86.9 fL (ref 80.0–100.0)
Monocytes Absolute: 0.6 10*3/uL (ref 0.1–1.0)
Monocytes Relative: 8 %
Neutro Abs: 5.9 10*3/uL (ref 1.7–7.7)
Neutrophils Relative %: 76 %
Platelet Count: 238 10*3/uL (ref 150–400)
RBC: 4.97 MIL/uL (ref 4.22–5.81)
RDW: 14.2 % (ref 11.5–15.5)
WBC Count: 7.7 10*3/uL (ref 4.0–10.5)
nRBC: 0 % (ref 0.0–0.2)

## 2022-02-26 MED ORDER — SODIUM CHLORIDE 0.9 % IV SOLN
500.0000 mg | Freq: Once | INTRAVENOUS | Status: AC
  Administered 2022-02-26: 500 mg via INTRAVENOUS
  Filled 2022-02-26: qty 25

## 2022-02-26 MED ORDER — DEXTROSE 5 % IV SOLN
36.0000 mg/m2 | Freq: Once | INTRAVENOUS | Status: AC
  Administered 2022-02-26: 80 mg via INTRAVENOUS
  Filled 2022-02-26: qty 10

## 2022-02-26 MED ORDER — DEXAMETHASONE 4 MG PO TABS
20.0000 mg | ORAL_TABLET | Freq: Once | ORAL | Status: AC
  Administered 2022-02-26: 20 mg via ORAL
  Filled 2022-02-26: qty 5

## 2022-02-26 MED ORDER — OXYCODONE-ACETAMINOPHEN 10-325 MG PO TABS: 0.5000 | ORAL_TABLET | Freq: Four times a day (QID) | ORAL | 0 refills | Status: DC | PRN

## 2022-02-26 MED ORDER — SODIUM CHLORIDE 0.9 % IV SOLN: Freq: Once | INTRAVENOUS | Status: AC

## 2022-02-26 NOTE — Progress Notes (Signed)
?[facility] ?OFFICE PROGRESS NOTE ? ? ?Diagnosis: Multiple myeloma ? ?INTERVAL HISTORY:  ? ?Steve Carter returns as scheduled.  He has completed 1 cycle of Cytoxan/carfilzomib/Decadron.  He is tolerating treatment well.  No mouth sores.  He reports mild nausea.  No rash or dyspnea.  He continues to have pain at the lower back.  He takes oxycodone every 4-5 hours.  There is persistent mild swelling in the left lower leg.  He has developed dry desquamation of the left lower leg.  He is scheduled for follow-up at [facility] tomorrow. ? ?Objective: ? ?Vital signs in last 24 hours: ? ?Blood pressure 118/72, pulse 69, temperature 98.7 ?F (37.1 ?C), temperature source Oral, resp. rate 18, height _0  (1.88 m), weight 270 lb (122.5 kg), SpO2 98 %. ?  ? ?HEENT: No thrush or ulcers ?Resp: Lungs clear bilaterally ?Cardio: Regular rate and rhythm ?GI: No hepatosplenomegaly ?Vascular: Trace edema to left lower leg  ?Skin: Dry superficial desquamation at the left pretibial area ? ? ?Lab Results: ? ?Lab Results  ?Component Value Date  ? WBC 7.7 02/26/2022  ? HGB 14.6 02/26/2022  ? HCT 43.2 02/26/2022  ? MCV 86.9 02/26/2022  ? PLT 238 02/26/2022  ? NEUTROABS 5.9 02/26/2022  ? ? ?CMP  ?Lab Results  ?Component Value Date  ? NA 134 (L) 02/12/2022  ? K 4.5 02/12/2022  ? CL 98 02/12/2022  ? CO2 25 02/12/2022  ? GLUCOSE 188 (H) 02/12/2022  ? BUN 33 (H) 02/12/2022  ? CREATININE 1.36 (H) 02/12/2022  ? CALCIUM 9.4 02/12/2022  ? PROT 6.9 02/12/2022  ? ALBUMIN 4.4 02/12/2022  ? AST 13 (L) 02/12/2022  ? ALT 19 02/12/2022  ? ALKPHOS 60 02/12/2022  ? BILITOT 0.3 02/12/2022  ? GFRNONAA >60 02/12/2022  ? GFRAA >60 08/10/2020  ? ? ?Medications: I have reviewed the patient's current medications. ? ? ?Assessment/Plan: ?Multiple myeloma- IgG lambda serum monoclonal protein, elevated serum lambda light chains ?Bone survey 10/11/2017-lytic lesions noted in the skull, right iliac, left second rib, and mottled appearance of the proximal  femurs/pelvis ?Bone marrow biopsy 10/14/2017-hypercellular marrow with plasma cell neoplasm, 82% plasma cells, lambda light chain restricted, hyperdiploid with gains of chromosomes 3, 5, 7, 9, and 11.  FISH panel positive for gain of ATM (+11) ?Cycle 1 RVD 10/18/2017 (Revlimid started 10/23/2017) ?Cycle 2 RVD 11/15/2017 ?Cycle 3 RVD 12/13/2017 ?Cycle 4 RVD 01/14/2018 ?Cycle 5 of RVD 02/14/2018 (Revlimid given for 7 days and 1 week of Velcade), therapy held beginning 02/21/2018 secondary to plan for stem cell therapy ?Bone marrow biopsy 02/20/2018, 1-2% plasma cell ?PET scan 02/20/2018, no malignant range activity above background, numerous lytic lesions throughout the axial and appendicular skeleton,, left iliac wing fracture ?Melphalan, 200 mg/m? on 03/24/2018 ?Stem cell infusion 03/25/2018 ?Restaging at [facility] 07/09/2018: Normal lambda light chains, no serum M spike, bone marrow biopsy negative for myeloma, less than 1% plasma cells ?PET scan at [facility] 07/09/2018- lytic bone lesions, no malignant range activity ?Initiation of maintenance Revlimid, 10 mg, 21/28 days 08/15/2018 ?Cycle 2 maintenance Revlimid 09/12/2018 ?Cycle 3 maintenance Revlimid 10/10/2018 ?Revlimid placed on hold 06/15/2019 due to presyncopal/syncopal episodes and diarrhea ?Revlimid resumed 5 mg 21 days on/7 days off following office visit 06/29/2019 ?Revlimid placed on hold 07/14/2019 ?Revlimid resumed 08/21/2019 ?Bone survey 09/09/2019- no acute findings or clear explanation for right buttock pain.  Lucent lesions in the right pelvis are stable without pathologic fracture.  Evidence of healing lytic lesions in the right  scapula, cervical spine spinous processes and left L4 transverse process.  Possible mild progression of lytic lesions within the L1 and L4 vertebral bodies.  Stable lytic lesions in the calvarium. ?PET scan 09/25/2019-new FDG avid bone lesions in the right humeral neck and sacrum similar remaining lytic lesions with FDG activity below  background ?B[facility] marrow biopsy at [facility] on 10/01/2019-5% plasma cells on the bone marrow biopsy suboptimal sample  ?Cycle 1 daratumumab, pomalidomide, Decadron 10/19/2019 ?Zometa 10/19/2019 ?Cycle 2 daratumumab, pomalidomide, Decadron 11/16/2019 (he will begin the pomalidomide 11/17/2019) ?Cycle 3 daratumumab, pomalidomide, and Decadron 12/14/2019 ?Cycle 4 daratumumab, pomalidomide, and Decadron 01/11/2020 ?Cycle 5 daratumumab, pomalidomide, Decadron 02/03/2020 (he began pomalidomide on 02/06/2020) ?Cycle 6 daratumumab, pomalidomide, Decadron 03/02/2020 (pomalidomide starting 03/12/2020) ?Cycle 7 daratumumab, pomalidomide, Decadron 03/29/2020 (pomalidomide scheduled to start 04/15/2020) ?Cycle 8 daratumumab, pomalidomide, Decadron 05/04/2020 (pomalidomide started 05/14/2020) ?Cycle 9 daratumumab-monthly 06/15/2020, pomalidomide held ?Cycle 10 daratumumab 07/13/2020, pomalidomide 2 mg 21 days beginning 07/15/2020 ?Cycle 11 daratumumab 08/10/2020, pomalidomide 2 mg 21 days beginning 08/12/2020 ?Bone marrow biopsy 09/06/2020-hypocellular bone marrow with a relative erythroid hyperplasia and no increase in plasma cells; MRD 0.0050% ?Cycle 12 daratumumab 09/07/2020, pomalidomide 2 mg 21 days beginning 09/09/2020 ?Cycle 13 daratumumab 10/11/2020, pomalidomide 2 mg 21 days beginning 10/10/2020 ?Cycle 14 daratumumab 11/08/2020, pomalidomide reduced to 1 mg 21 days ?Cycle 15 daratumumab 12/14/2020, Pomalidomide 1 mg 21 days beginning 12/13/2020 ?Cycle 16 daratumumab 01/12/2021, pomalidomide 1 mg 21 days beginning 01/11/2021 ?Cycle 17 daratumumab 02/10/2021, pomalidomide 1 mg 21 days beginning 02/09/2021 ?Cycle 18 daratumumab 03/10/2021, pomalidomide 1 mg 21 days beginning 03/11/2021 ?Cycle 19 daratumumab 04/07/2021, Pomalidomide 1 mg 21 days beginning 04/08/2021 ?Cycle 20 daratumumab 05/09/2021, Pomalidomide 1 mg 21 days beginning 05/12/2021 ?Cycle 21 daratumumab 06/07/2021, pomalidomide held secondary to progressive neuropathy and diarrhea ?Cycle 22 daratumumab  07/05/2021, pomalidomide held secondary to neuropathy ?Cycle 23 daratumumab 08/02/2021, Pomalidomide remains on hold ?Cycle 24 daratumumab 08/30/2021, pomalidomide on hold ?Cycle 25 daratumumab 09/27/2021, pomalidomide on hold ?Cycle 26 daratumumab 10/23/2021, pomalidomide on hold ?Cycle 27 daratumumab 11/20/2021, pomalidomide on hold ?Cycle 28 daratumumab 12/18/2021, Pomalidomide on hold ?Increased serum M spike and serum free lambda light chains December 2022 ?PET 01/12/2022-multiple lytic lesions with mild hypermetabolism, most prominent hypermetabolic lesion at the sacrum ?Cycle 1 carfilzomib/Cytoxan/Decadron 01/29/2022 ?Cycle 2 carfilzomib/Cytoxan/Decadron 02/26/2022 ?  ?2.  Pain secondary to multiple myeloma involving the spine and pelvis-resolved ?MRI of the lumbar spine 10/02/2018- numerous rounded foci in the vertebral bodies, hypertrophy of the L4 transverse process ?  ?3.  Hypertension-losartan dose increased 11/15/2017 ?  ?4.  Depression-improved with Wellbutrin beginning September 2020 ?  ?5.  Altered mental status-improved depression related? ?6. Diabetes ?7.  Recurrent episodes of fall/syncope-etiology unclear-evaluated by neurology at [facility], MRI brain 01/25/2020 with abnormal signal at the left superior frontal gyrus-potentially representing a low-grade glioma, scattered foci of increased T2 white matter signal-likely related to chronic small vessel disease ?Stereotactic brain biopsy at [facility] 03/14/2020-gliosis, no evidence of malignancy ?  ?8.  Peripheral neuropathy-gabapentin started 07/07/2020; reports increase in symptoms 06/07/2021-pomalidomide placed on hold; reports increase in symptoms 08/02/2021 ?  ?9.  Renal insufficiency ? ?10.  Left lower extremity cellulitis 02/01/2022 ? ?11.  Occlusive clot in the greater saphenous vein 02/05/2022-Eliquis initiated ?  ? ? ?Disposition: ?Steve Carter appears unchanged.  He is completed 1 cycle of Cytoxan/carfilzomib/Decadron.  He has tolerated treatment well.   His pain has not improved.  We will follow-up on the myeloma panel from today.  He will begin cycle 2  today.  He continues apixaban anticoagulation for the left greater saphenous thrombosis. ? ?He will

## 2022-02-26 NOTE — Patient Instructions (Signed)
[facility]   ?Discharge Instructions: ?Thank you for choosing [facility] to provide your oncology and hematology care.  ? ?If you have a lab appointment with the [facility], please go directly to the [facility] and check in at the registration area. ?  ?Wear comfortable clothing and clothing appropriate for easy access to any Portacath or PICC line.  ? ?We strive to give you quality time with your provider. You may need to reschedule your appointment if you arrive late (15 or more minutes).  Arriving late affects you and other patients whose appointments are after yours.  Also, if you miss three or more appointments without notifying the office, you may be dismissed from the clinic at the provider?s discretion.    ?  ?For prescription refill requests, have your pharmacy contact our office and allow 72 hours for refills to be completed.   ? ?Today you received the following chemotherapy and/or immunotherapy agents Cytoxan, Kyprolis    ?  ?To help prevent nausea and vomiting after your treatment, we encourage you to take your nausea medication as directed. ? ?BELOW ARE SYMPTOMS THAT SHOULD BE REPORTED IMMEDIATELY: ?*FEVER GREATER THAN 100.4 F (38 ?C) OR HIGHER ?*CHILLS OR SWEATING ?*NAUSEA AND VOMITING THAT IS NOT CONTROLLED WITH YOUR NAUSEA MEDICATION ?*UNUSUAL SHORTNESS OF BREATH ?*UNUSUAL BRUISING OR BLEEDING ?*URINARY PROBLEMS (pain or burning when urinating, or frequent urination) ?*BOWEL PROBLEMS (unusual diarrhea, constipation, pain near the anus) ?TENDERNESS IN MOUTH AND THROAT WITH OR WITHOUT PRESENCE OF ULCERS (sore throat, sores in mouth, or a toothache) ?UNUSUAL RASH, SWELLING OR PAIN  ?UNUSUAL VAGINAL DISCHARGE OR ITCHING  ? ?Items with * indicate a potential emergency and should be followed up as soon as possible or go to the Emergency Department if any problems should occur. ? ?Please show the CHEMOTHERAPY ALERT CARD or IMMUNOTHERAPY ALERT CARD at  check-in to the Emergency Department and triage nurse. ? ?Should you have questions after your visit or need to cancel or reschedule your appointment, please contact [facility]  Dept: [phone]  and follow the prompts.  Office hours are 8:00 a.m. to 4:30 p.m. Monday - Friday. Please note that voicemails left after 4:00 p.m. may not be returned until the following business day.  We are closed weekends and major holidays. You have access to a nurse at all times for urgent questions. Please call the main number to the clinic Dept: [phone] and follow the prompts. ? ? ?For any non-urgent questions, you may also contact your provider using MyChart. We now offer e-Visits for anyone 13 and older to request care online for non-urgent symptoms. For details visit [email or web address]. ?  ?Also download the MyChart app! Go to the app store, search "MyChart", open the app, select [facility], and log in with your MyChart username and password. ? ?Due to Covid, a mask is required upon entering the hospital/clinic. If you do not have a mask, one will be given to you upon arrival. For doctor visits, patients may have 1 support person aged 22 or older with them. For treatment visits, patients cannot have anyone with them due to current Covid guidelines and our immunocompromised population.  ? ?Cyclophosphamide Injection ?What is this medication? ?CYCLOPHOSPHAMIDE (sye kloe FOSS fa mide) is a chemotherapy drug. It slows the growth of cancer cells. This medicine is used to treat many types of cancer like lymphoma, myeloma, leukemia, breast cancer, and ovarian cancer, to name a few. ?This  medicine may be used for other purposes; ask your health care provider or pharmacist if you have questions. ?COMMON BRAND NAME(S): Cytoxan, Neosar ?What should I tell my care team before I take this medication? ?They need to know if you have any of these conditions: ?heart disease ?history of irregular  heartbeat ?infection ?kidney disease ?liver disease ?low blood counts, like white cells, platelets, or red blood cells ?on hemodialysis ?recent or ongoing radiation therapy ?scarring or thickening of the lungs ?trouble passing urine ?an unusual or allergic reaction to cyclophosphamide, other medicines, foods, dyes, or preservatives ?pregnant or trying to get pregnant ?breast-feeding ?How should I use this medication? ?This drug is usually given as an injection into a vein or muscle or by infusion into a vein. It is administered in a hospital or clinic by a specially trained health care professional. ?Talk to your pediatrician regarding the use of this medicine in children. Special care may be needed. ?Overdosage: If you think you have taken too much of this medicine contact a poison control center or emergency room at once. ?NOTE: This medicine is only for you. Do not share this medicine with others. ?What if I miss a dose? ?It is important not to miss your dose. Call your doctor or health care professional if you are unable to keep an appointment. ?What may interact with this medication? ?amphotericin B ?azathioprine ?certain antivirals for HIV or hepatitis ?certain medicines for blood pressure, heart disease, irregular heart beat ?certain medicines that treat or prevent blood clots like warfarin ?certain other medicines for cancer ?cyclosporine ?etanercept ?indomethacin ?medicines that relax muscles for surgery ?medicines to increase blood counts ?metronidazole ?This list may not describe all possible interactions. Give your health care provider a list of all the medicines, herbs, non-prescription drugs, or dietary supplements you use. Also tell them if you smoke, drink alcohol, or use illegal drugs. Some items may interact with your medicine. ?What should I watch for while using this medication? ?Your condition will be monitored carefully while you are receiving this medicine. ?You may need blood work done while  you are taking this medicine. ?Drink water or other fluids as directed. Urinate often, even at night. ?Some products may contain alcohol. Ask your health care professional if this medicine contains alcohol. Be sure to tell all health care professionals you are taking this medicine. Certain medicines, like metronidazole and disulfiram, can cause an unpleasant reaction when taken with alcohol. The reaction includes flushing, headache, nausea, vomiting, sweating, and increased thirst. The reaction can last from 30 minutes to several hours. ?Do not become pregnant while taking this medicine or for 1 year after stopping it. Women should inform their health care professional if they wish to become pregnant or think they might be pregnant. Men should not father a child while taking this medicine and for 4 months after stopping it. There is potential for serious side effects to an unborn child. Talk to your health care professional for more information. ?Do not breast-feed an infant while taking this medicine or for 1 week after stopping it. ?This medicine has caused ovarian failure in some women. This medicine may make it more difficult to get pregnant. Talk to your health care professional if you are concerned about your fertility. ?This medicine has caused decreased sperm counts in some men. This may make it more difficult to father a child. Talk to your health care professional if you are concerned about your fertility. ?Call your health care professional for advice if you get  a fever, chills, or sore throat, or other symptoms of a cold or flu. Do not treat yourself. This medicine decreases your body's ability to fight infections. Try to avoid being around people who are sick. ?Avoid taking medicines that contain aspirin, acetaminophen, ibuprofen, naproxen, or ketoprofen unless instructed by your health care professional. These medicines may hide a fever. ?Talk to your health care professional about your risk of cancer.  You may be more at risk for certain types of cancer if you take this medicine. ?If you are going to need surgery or other procedure, tell your health care professional that you are using this medicine. ?Be careful b

## 2022-02-26 NOTE — Progress Notes (Signed)
Patient presents for treatment. RN assessment completed along with the following: ? ?Labs/vitals reviewed - Yes, and within treatment parameters.   ?Weight within 10% of previous measurement - Yes ?Informed consent completed and reflects current therapy/intent - Yes, on date 01/29/22             ?Provider progress note reviewed - Yes, today's provider note was reviewed. ?Treatment/Antibody/Supportive plan reviewed - Yes, and there are no adjustments needed for today's treatment. ?S&H and other orders reviewed - Yes, and there are no additional orders identified. ?Previous treatment date reviewed - Yes, and the appropriate amount of time has elapsed between treatments. ?Clinic Hand Off Received from - Cristy Friedlander, RN ? ? ?Patient to proceed with treatment.  ? ?

## 2022-02-26 NOTE — Progress Notes (Signed)
Patient seen by Dr. Sherrill today ? ?Vitals are within treatment parameters. ? ?Labs reviewed by Dr. Sherrill and are within treatment parameters. ? ?Per physician team, patient is ready for treatment and there are NO modifications to the treatment plan.  ?

## 2022-02-27 LAB — KAPPA/LAMBDA LIGHT CHAINS
Kappa free light chain: 5.8 mg/L (ref 3.3–19.4)
Kappa, lambda light chain ratio: 0.07 — ABNORMAL LOW (ref 0.26–1.65)
Lambda free light chains: 86.7 mg/L — ABNORMAL HIGH (ref 5.7–26.3)

## 2022-02-28 ENCOUNTER — Other Ambulatory Visit: Payer: Self-pay | Admitting: Oncology

## 2022-02-28 DIAGNOSIS — C9000 Multiple myeloma not having achieved remission: Secondary | ICD-10-CM

## 2022-02-28 LAB — PROTEIN ELECTROPHORESIS, SERUM
A/G Ratio: 1.1 (ref 0.7–1.7)
Albumin ELP: 3.5 g/dL (ref 2.9–4.4)
Alpha-1-Globulin: 0.2 g/dL (ref 0.0–0.4)
Alpha-2-Globulin: 1.3 g/dL — ABNORMAL HIGH (ref 0.4–1.0)
Beta Globulin: 0.9 g/dL (ref 0.7–1.3)
Gamma Globulin: 0.7 g/dL (ref 0.4–1.8)
Globulin, Total: 3.1 g/dL (ref 2.2–3.9)
M-Spike, %: 0.4 g/dL — ABNORMAL HIGH
Total Protein ELP: 6.6 g/dL (ref 6.0–8.5)

## 2022-03-04 ENCOUNTER — Encounter: Payer: Self-pay | Admitting: Oncology

## 2022-03-05 ENCOUNTER — Inpatient Hospital Stay: Payer: Medicare PPO | Attending: Nurse Practitioner

## 2022-03-05 ENCOUNTER — Inpatient Hospital Stay: Payer: Medicare PPO

## 2022-03-05 VITALS — BP 106/73 | HR 71 | Temp 98.7°F | Resp 20 | Ht 74.0 in | Wt 272.8 lb

## 2022-03-05 DIAGNOSIS — G620 Drug-induced polyneuropathy: Secondary | ICD-10-CM | POA: Diagnosis not present

## 2022-03-05 DIAGNOSIS — N289 Disorder of kidney and ureter, unspecified: Secondary | ICD-10-CM | POA: Diagnosis not present

## 2022-03-05 DIAGNOSIS — G893 Neoplasm related pain (acute) (chronic): Secondary | ICD-10-CM | POA: Diagnosis not present

## 2022-03-05 DIAGNOSIS — C9000 Multiple myeloma not having achieved remission: Secondary | ICD-10-CM

## 2022-03-05 DIAGNOSIS — Z79899 Other long term (current) drug therapy: Secondary | ICD-10-CM | POA: Insufficient documentation

## 2022-03-05 DIAGNOSIS — T451X5A Adverse effect of antineoplastic and immunosuppressive drugs, initial encounter: Secondary | ICD-10-CM | POA: Insufficient documentation

## 2022-03-05 DIAGNOSIS — E119 Type 2 diabetes mellitus without complications: Secondary | ICD-10-CM | POA: Diagnosis not present

## 2022-03-05 DIAGNOSIS — Z7901 Long term (current) use of anticoagulants: Secondary | ICD-10-CM | POA: Diagnosis not present

## 2022-03-05 DIAGNOSIS — K219 Gastro-esophageal reflux disease without esophagitis: Secondary | ICD-10-CM | POA: Insufficient documentation

## 2022-03-05 DIAGNOSIS — G4730 Sleep apnea, unspecified: Secondary | ICD-10-CM | POA: Diagnosis not present

## 2022-03-05 DIAGNOSIS — Z5111 Encounter for antineoplastic chemotherapy: Secondary | ICD-10-CM | POA: Insufficient documentation

## 2022-03-05 DIAGNOSIS — I10 Essential (primary) hypertension: Secondary | ICD-10-CM | POA: Diagnosis not present

## 2022-03-05 DIAGNOSIS — Z5112 Encounter for antineoplastic immunotherapy: Secondary | ICD-10-CM | POA: Diagnosis present

## 2022-03-05 DIAGNOSIS — Z9484 Stem cells transplant status: Secondary | ICD-10-CM | POA: Diagnosis not present

## 2022-03-05 LAB — CBC WITH DIFFERENTIAL (CANCER CENTER ONLY)
Abs Immature Granulocytes: 0.22 10*3/uL — ABNORMAL HIGH (ref 0.00–0.07)
Basophils Absolute: 0 10*3/uL (ref 0.0–0.1)
Basophils Relative: 0 %
Eosinophils Absolute: 0.1 10*3/uL (ref 0.0–0.5)
Eosinophils Relative: 1 %
HCT: 43.3 % (ref 39.0–52.0)
Hemoglobin: 14.4 g/dL (ref 13.0–17.0)
Immature Granulocytes: 2 %
Lymphocytes Relative: 8 %
Lymphs Abs: 0.8 10*3/uL (ref 0.7–4.0)
MCH: 29.7 pg (ref 26.0–34.0)
MCHC: 33.3 g/dL (ref 30.0–36.0)
MCV: 89.3 fL (ref 80.0–100.0)
Monocytes Absolute: 0.8 10*3/uL (ref 0.1–1.0)
Monocytes Relative: 8 %
Neutro Abs: 7.6 10*3/uL (ref 1.7–7.7)
Neutrophils Relative %: 81 %
Platelet Count: 200 10*3/uL (ref 150–400)
RBC: 4.85 MIL/uL (ref 4.22–5.81)
RDW: 15.6 % — ABNORMAL HIGH (ref 11.5–15.5)
WBC Count: 9.4 10*3/uL (ref 4.0–10.5)
nRBC: 0 % (ref 0.0–0.2)

## 2022-03-05 MED ORDER — SODIUM CHLORIDE 0.9 % IV SOLN: Freq: Once | INTRAVENOUS | Status: AC

## 2022-03-05 MED ORDER — SODIUM CHLORIDE 0.9 % IV SOLN
500.0000 mg | Freq: Once | INTRAVENOUS | Status: AC
  Administered 2022-03-05: 500 mg via INTRAVENOUS
  Filled 2022-03-05: qty 25

## 2022-03-05 MED ORDER — DEXTROSE 5 % IV SOLN
56.0000 mg/m2 | Freq: Once | INTRAVENOUS | Status: AC
  Administered 2022-03-05: 120 mg via INTRAVENOUS
  Filled 2022-03-05: qty 60

## 2022-03-05 MED ORDER — DEXAMETHASONE 4 MG PO TABS
20.0000 mg | ORAL_TABLET | Freq: Once | ORAL | Status: AC
  Administered 2022-03-05: 20 mg via ORAL
  Filled 2022-03-05: qty 5

## 2022-03-05 NOTE — Progress Notes (Signed)
Patient presents for treatment. RN assessment completed along with the following: ? ?Labs/vitals reviewed - Yes, and within treatment parameters.   ?Weight within 10% of previous measurement - Yes ?Informed consent completed and reflects current therapy/intent -  2 /27/23 ?            ?Provider progress note reviewed - Patient not seen by provider today. Most recent note dated 02/26/22 reviewed. ?Treatment/Antibody/Supportive plan reviewed - Yes, and there are no adjustments needed for today's treatment. ?S&H and other orders reviewed - Yes, and there are no additional orders identified. ?Previous treatment date reviewed - Yes, and the appropriate amount of time has elapsed between treatments. ?Clinic Hand Off Received from - None ? ?Patient to proceed with treatment.  ?+ ?

## 2022-03-05 NOTE — Patient Instructions (Signed)
[facility]  ? Discharge Instructions: ?Thank you for choosing [facility] to provide your oncology and hematology care.  ? ?If you have a lab appointment with the [facility], please go directly to the [facility] and check in at the registration area. ?  ?Wear comfortable clothing and clothing appropriate for easy access to any Portacath or PICC line.  ? ?We strive to give you quality time with your provider. You may need to reschedule your appointment if you arrive late (15 or more minutes).  Arriving late affects you and other patients whose appointments are after yours.  Also, if you miss three or more appointments without notifying the office, you may be dismissed from the clinic at the provider?s discretion.    ?  ?For prescription refill requests, have your pharmacy contact our office and allow 72 hours for refills to be completed.   ? ?Today you received the following chemotherapy and/or immunotherapy agents Cytoxan, Kyprolis    ?  ?To help prevent nausea and vomiting after your treatment, we encourage you to take your nausea medication as directed. ? ?BELOW ARE SYMPTOMS THAT SHOULD BE REPORTED IMMEDIATELY: ?*FEVER GREATER THAN 100.4 F (38 ?C) OR HIGHER ?*CHILLS OR SWEATING ?*NAUSEA AND VOMITING THAT IS NOT CONTROLLED WITH YOUR NAUSEA MEDICATION ?*UNUSUAL SHORTNESS OF BREATH ?*UNUSUAL BRUISING OR BLEEDING ?*URINARY PROBLEMS (pain or burning when urinating, or frequent urination) ?*BOWEL PROBLEMS (unusual diarrhea, constipation, pain near the anus) ?TENDERNESS IN MOUTH AND THROAT WITH OR WITHOUT PRESENCE OF ULCERS (sore throat, sores in mouth, or a toothache) ?UNUSUAL RASH, SWELLING OR PAIN  ?UNUSUAL VAGINAL DISCHARGE OR ITCHING  ? ?Items with * indicate a potential emergency and should be followed up as soon as possible or go to the Emergency Department if any problems should occur. ? ?Please show the CHEMOTHERAPY ALERT CARD or IMMUNOTHERAPY ALERT CARD at  check-in to the Emergency Department and triage nurse. ? ?Should you have questions after your visit or need to cancel or reschedule your appointment, please contact [facility]  Dept: [phone]  and follow the prompts.  Office hours are 8:00 a.m. to 4:30 p.m. Monday - Friday. Please note that voicemails left after 4:00 p.m. may not be returned until the following business day.  We are closed weekends and major holidays. You have access to a nurse at all times for urgent questions. Please call the main number to the clinic Dept: [phone] and follow the prompts. ? ? ?For any non-urgent questions, you may also contact your provider using MyChart. We now offer e-Visits for anyone 50 and older to request care online for non-urgent symptoms. For details visit [email or web address]. ?  ?Also download the MyChart app! Go to the app store, search "MyChart", open the app, select [facility], and log in with your MyChart username and password. ? ?Due to Covid, a mask is required upon entering the hospital/clinic. If you do not have a mask, one will be given to you upon arrival. For doctor visits, patients may have 1 support person aged 24 or older with them. For treatment visits, patients cannot have anyone with them due to current Covid guidelines and our immunocompromised population.  ? ?Cyclophosphamide Injection ?What is this medication? ?CYCLOPHOSPHAMIDE (sye kloe FOSS fa mide) is a chemotherapy drug. It slows the growth of cancer cells. This medicine is used to treat many types of cancer like lymphoma, myeloma, leukemia, breast cancer, and ovarian cancer, to name a few. ?This  medicine may be used for other purposes; ask your health care provider or pharmacist if you have questions. ?COMMON BRAND NAME(S): Cytoxan, Neosar ?What should I tell my care team before I take this medication? ?They need to know if you have any of these conditions: ?heart disease ?history of irregular  heartbeat ?infection ?kidney disease ?liver disease ?low blood counts, like white cells, platelets, or red blood cells ?on hemodialysis ?recent or ongoing radiation therapy ?scarring or thickening of the lungs ?trouble passing urine ?an unusual or allergic reaction to cyclophosphamide, other medicines, foods, dyes, or preservatives ?pregnant or trying to get pregnant ?breast-feeding ?How should I use this medication? ?This drug is usually given as an injection into a vein or muscle or by infusion into a vein. It is administered in a hospital or clinic by a specially trained health care professional. ?Talk to your pediatrician regarding the use of this medicine in children. Special care may be needed. ?Overdosage: If you think you have taken too much of this medicine contact a poison control center or emergency room at once. ?NOTE: This medicine is only for you. Do not share this medicine with others. ?What if I miss a dose? ?It is important not to miss your dose. Call your doctor or health care professional if you are unable to keep an appointment. ?What may interact with this medication? ?amphotericin B ?azathioprine ?certain antivirals for HIV or hepatitis ?certain medicines for blood pressure, heart disease, irregular heart beat ?certain medicines that treat or prevent blood clots like warfarin ?certain other medicines for cancer ?cyclosporine ?etanercept ?indomethacin ?medicines that relax muscles for surgery ?medicines to increase blood counts ?metronidazole ?This list may not describe all possible interactions. Give your health care provider a list of all the medicines, herbs, non-prescription drugs, or dietary supplements you use. Also tell them if you smoke, drink alcohol, or use illegal drugs. Some items may interact with your medicine. ?What should I watch for while using this medication? ?Your condition will be monitored carefully while you are receiving this medicine. ?You may need blood work done while  you are taking this medicine. ?Drink water or other fluids as directed. Urinate often, even at night. ?Some products may contain alcohol. Ask your health care professional if this medicine contains alcohol. Be sure to tell all health care professionals you are taking this medicine. Certain medicines, like metronidazole and disulfiram, can cause an unpleasant reaction when taken with alcohol. The reaction includes flushing, headache, nausea, vomiting, sweating, and increased thirst. The reaction can last from 30 minutes to several hours. ?Do not become pregnant while taking this medicine or for 1 year after stopping it. Women should inform their health care professional if they wish to become pregnant or think they might be pregnant. Men should not father a child while taking this medicine and for 4 months after stopping it. There is potential for serious side effects to an unborn child. Talk to your health care professional for more information. ?Do not breast-feed an infant while taking this medicine or for 1 week after stopping it. ?This medicine has caused ovarian failure in some women. This medicine may make it more difficult to get pregnant. Talk to your health care professional if you are concerned about your fertility. ?This medicine has caused decreased sperm counts in some men. This may make it more difficult to father a child. Talk to your health care professional if you are concerned about your fertility. ?Call your health care professional for advice if you get  a fever, chills, or sore throat, or other symptoms of a cold or flu. Do not treat yourself. This medicine decreases your body's ability to fight infections. Try to avoid being around people who are sick. ?Avoid taking medicines that contain aspirin, acetaminophen, ibuprofen, naproxen, or ketoprofen unless instructed by your health care professional. These medicines may hide a fever. ?Talk to your health care professional about your risk of cancer.  You may be more at risk for certain types of cancer if you take this medicine. ?If you are going to need surgery or other procedure, tell your health care professional that you are using this medicine. ?Be careful b

## 2022-03-09 ENCOUNTER — Other Ambulatory Visit: Payer: Self-pay | Admitting: Adult Health

## 2022-03-09 DIAGNOSIS — F331 Major depressive disorder, recurrent, moderate: Secondary | ICD-10-CM

## 2022-03-09 DIAGNOSIS — F411 Generalized anxiety disorder: Secondary | ICD-10-CM

## 2022-03-11 ENCOUNTER — Other Ambulatory Visit: Payer: Self-pay | Admitting: Oncology

## 2022-03-12 ENCOUNTER — Inpatient Hospital Stay: Payer: Medicare PPO

## 2022-03-12 ENCOUNTER — Encounter: Payer: Self-pay | Admitting: Oncology

## 2022-03-12 VITALS — BP 113/71 | HR 71 | Temp 98.0°F | Resp 20 | Ht 74.0 in | Wt 272.0 lb

## 2022-03-12 DIAGNOSIS — Z5112 Encounter for antineoplastic immunotherapy: Secondary | ICD-10-CM | POA: Diagnosis not present

## 2022-03-12 DIAGNOSIS — C9000 Multiple myeloma not having achieved remission: Secondary | ICD-10-CM

## 2022-03-12 LAB — CBC WITH DIFFERENTIAL (CANCER CENTER ONLY)
Abs Immature Granulocytes: 0.12 10*3/uL — ABNORMAL HIGH (ref 0.00–0.07)
Basophils Absolute: 0 10*3/uL (ref 0.0–0.1)
Basophils Relative: 0 %
Eosinophils Absolute: 0.1 10*3/uL (ref 0.0–0.5)
Eosinophils Relative: 1 %
HCT: 41.1 % (ref 39.0–52.0)
Hemoglobin: 13.9 g/dL (ref 13.0–17.0)
Immature Granulocytes: 1 %
Lymphocytes Relative: 8 %
Lymphs Abs: 0.7 10*3/uL (ref 0.7–4.0)
MCH: 29.6 pg (ref 26.0–34.0)
MCHC: 33.8 g/dL (ref 30.0–36.0)
MCV: 87.6 fL (ref 80.0–100.0)
Monocytes Absolute: 0.9 10*3/uL (ref 0.1–1.0)
Monocytes Relative: 11 %
Neutro Abs: 6.7 10*3/uL (ref 1.7–7.7)
Neutrophils Relative %: 79 %
Platelet Count: 184 10*3/uL (ref 150–400)
RBC: 4.69 MIL/uL (ref 4.22–5.81)
RDW: 15.3 % (ref 11.5–15.5)
WBC Count: 8.5 10*3/uL (ref 4.0–10.5)
nRBC: 0 % (ref 0.0–0.2)

## 2022-03-12 MED ORDER — DEXAMETHASONE 4 MG PO TABS
20.0000 mg | ORAL_TABLET | Freq: Once | ORAL | Status: AC
  Administered 2022-03-12: 20 mg via ORAL
  Filled 2022-03-12: qty 5

## 2022-03-12 MED ORDER — SODIUM CHLORIDE 0.9 % IV SOLN: Freq: Once | INTRAVENOUS | Status: AC

## 2022-03-12 MED ORDER — DEXTROSE 5 % IV SOLN
56.0000 mg/m2 | Freq: Once | INTRAVENOUS | Status: AC
  Administered 2022-03-12: 120 mg via INTRAVENOUS
  Filled 2022-03-12: qty 60

## 2022-03-12 MED ORDER — SODIUM CHLORIDE 0.9 % IV SOLN
500.0000 mg | Freq: Once | INTRAVENOUS | Status: AC
  Administered 2022-03-12: 500 mg via INTRAVENOUS
  Filled 2022-03-12: qty 25

## 2022-03-12 NOTE — Progress Notes (Signed)
Patient presents for treatment. RN assessment completed along with the following: ? ?Labs/vitals reviewed - Yes, and Per Dr.Sherrill: okay to treat with CBC only today.    ?Weight within 10% of previous measurement - Yes ?Informed consent completed and reflects current therapy/intent - Yes, on date 01/29/2022             ?Provider progress note reviewed - Patient not seen by provider today. Most recent note dated 02/26/2022 reviewed. ?Treatment/Antibody/Supportive plan reviewed - Yes, and there are no adjustments needed for today's treatment. ?S&H and other orders reviewed - Yes, and there are no additional orders identified. ?Previous treatment date reviewed - Yes, and the appropriate amount of time has elapsed between treatments. ?Clinic Hand Off Received from - No. ? ?Patient to proceed with treatment.  ? ?

## 2022-03-12 NOTE — Telephone Encounter (Signed)
Please call to schedule an appt. Last seen 2/13 with RTC in 4 weeks.  ?

## 2022-03-12 NOTE — Telephone Encounter (Signed)
Pt has an appt 4/24 ?

## 2022-03-12 NOTE — Patient Instructions (Signed)
[facility]   ?Discharge Instructions: ?Thank you for choosing [facility] to provide your oncology and hematology care.  ? ?If you have a lab appointment with the [facility], please go directly to the [facility] and check in at the registration area. ?  ?Wear comfortable clothing and clothing appropriate for easy access to any Portacath or PICC line.  ? ?We strive to give you quality time with your provider. You may need to reschedule your appointment if you arrive late (15 or more minutes).  Arriving late affects you and other patients whose appointments are after yours.  Also, if you miss three or more appointments without notifying the office, you may be dismissed from the clinic at the provider?s discretion.    ?  ?For prescription refill requests, have your pharmacy contact our office and allow 72 hours for refills to be completed.   ? ?Today you received the following chemotherapy and/or immunotherapy agents Carfilzomib (KYPROLIS) & Cyclophosphamide (CYTOXAN).    ?  ?To help prevent nausea and vomiting after your treatment, we encourage you to take your nausea medication as directed. ? ?BELOW ARE SYMPTOMS THAT SHOULD BE REPORTED IMMEDIATELY: ?*FEVER GREATER THAN 100.4 F (38 ?C) OR HIGHER ?*CHILLS OR SWEATING ?*NAUSEA AND VOMITING THAT IS NOT CONTROLLED WITH YOUR NAUSEA MEDICATION ?*UNUSUAL SHORTNESS OF BREATH ?*UNUSUAL BRUISING OR BLEEDING ?*URINARY PROBLEMS (pain or burning when urinating, or frequent urination) ?*BOWEL PROBLEMS (unusual diarrhea, constipation, pain near the anus) ?TENDERNESS IN MOUTH AND THROAT WITH OR WITHOUT PRESENCE OF ULCERS (sore throat, sores in mouth, or a toothache) ?UNUSUAL RASH, SWELLING OR PAIN  ?UNUSUAL VAGINAL DISCHARGE OR ITCHING  ? ?Items with * indicate a potential emergency and should be followed up as soon as possible or go to the Emergency Department if any problems should occur. ? ?Please show the CHEMOTHERAPY ALERT CARD or  IMMUNOTHERAPY ALERT CARD at check-in to the Emergency Department and triage nurse. ? ?Should you have questions after your visit or need to cancel or reschedule your appointment, please contact [facility]  Dept: [phone]  and follow the prompts.  Office hours are 8:00 a.m. to 4:30 p.m. Monday - Friday. Please note that voicemails left after 4:00 p.m. may not be returned until the following business day.  We are closed weekends and major holidays. You have access to a nurse at all times for urgent questions. Please call the main number to the clinic Dept: [phone] and follow the prompts. ? ? ?For any non-urgent questions, you may also contact your provider using MyChart. We now offer e-Visits for anyone 27 and older to request care online for non-urgent symptoms. For details visit [email or web address]. ?  ?Also download the MyChart app! Go to the app store, search "MyChart", open the app, select [facility], and log in with your MyChart username and password. ? ?Due to Covid, a mask is required upon entering the hospital/clinic. If you do not have a mask, one will be given to you upon arrival. For doctor visits, patients may have 1 support person aged 72 or older with them. For treatment visits, patients cannot have anyone with them due to current Covid guidelines and our immunocompromised population.  ? ?Carfilzomib injection ?What is this medication? ?CARFILZOMIB (kar FILZ oh mib) targets a specific protein within cancer cells and stops the cancer cells from growing. It is used to treat multiple myeloma. ?This medicine may be used for other purposes; ask your health care  provider or pharmacist if you have questions. ?COMMON BRAND NAME(S): KYPROLIS ?What should I tell my care team before I take this medication? ?They need to know if you have any of these conditions: ?heart disease ?history of blood clots ?irregular heartbeat ?kidney disease ?liver disease ?lung or breathing  disease ?an unusual or allergic reaction to carfilzomib, or other medicines, foods, dyes, or preservatives ?pregnant or trying to get pregnant ?breast-feeding ?How should I use this medication? ?This medicine is for injection or infusion into a vein. It is given by a health care professional in a hospital or clinic setting. ?Talk to your pediatrician regarding the use of this medicine in children. Special care may be needed. ?Overdosage: If you think you have taken too much of this medicine contact a poison control center or emergency room at once. ?NOTE: This medicine is only for you. Do not share this medicine with others. ?What if I miss a dose? ?It is important not to miss your dose. Call your doctor or health care professional if you are unable to keep an appointment. ?What may interact with this medication? ?Interactions are not expected. ?This list may not describe all possible interactions. Give your health care provider a list of all the medicines, herbs, non-prescription drugs, or dietary supplements you use. Also tell them if you smoke, drink alcohol, or use illegal drugs. Some items may interact with your medicine. ?What should I watch for while using this medication? ?Your condition will be monitored while you are receiving this medicine. ?You may need blood work done while you are taking this medicine. ?Do not become pregnant while taking this medicine or for 6 months after stopping it. Women should inform their health care provider if they wish to become pregnant or think they might be pregnant. Men should not father a child while taking this medicine and for 3 months after stopping it. There is a potential for serious side effects to an unborn child. Talk to your health care provider for more information. Do not breast-feed an infant while taking this medicine or for 2 weeks after stopping it. ?Check with your health care provider if you have severe diarrhea, nausea, and vomiting, or if you sweat a  lot. The loss of too much body fluid may make it dangerous for you to take this medicine. ?You may get drowsy or dizzy. Do not drive, use machinery, or do anything that needs mental alertness until you know how this medicine affects you. Do not stand up or sit up quickly, especially if you are an older patient. This reduces the risk of dizzy or fainting spells. ?What side effects may I notice from receiving this medication? ?Side effects that you should report to your doctor or health care professional as soon as possible: ?allergic reactions like skin rash, itching or hives, swelling of the face, lips, or tongue ?confusion ?dizziness ?feeling faint or lightheaded ?fever or chills ?palpitations ?seizures ?signs and symptoms of bleeding such as bloody or black, tarry stools; red or dark-brown urine; spitting up blood or brown material that looks like coffee grounds; red spots on the skin; unusual bruising or bleeding including from the eye, gums, or nose ?signs and symptoms of a blood clot such as breathing problems; changes in vision; chest pain; severe, sudden headache; pain, swelling, warmth in the leg; trouble speaking; sudden numbness or weakness of the face, arm or leg ?signs and symptoms of kidney injury like trouble passing urine or change in the amount of urine ?signs and  symptoms of liver injury like dark yellow or brown urine; general ill feeling or flu-like symptoms; light-colored stools; loss of appetite; nausea; right upper belly pain; unusually weak or tired; yellowing of the eyes or skin ?Side effects that usually do not require medical attention (report to your doctor or health care professional if they continue or are bothersome): ?back pain ?cough ?diarrhea ?headache ?muscle cramps ?trouble sleeping ?vomiting ?This list may not describe all possible side effects. Call your doctor for medical advice about side effects. You may report side effects to FDA at 1-800-FDA-1088. ?Where should I keep my  medication? ?This drug is given in a hospital or clinic and will not be stored at home. ?NOTE: This sheet is a summary. It may not cover all possible information. If you have questions about this medicine,

## 2022-03-14 ENCOUNTER — Other Ambulatory Visit: Payer: Self-pay | Admitting: Oncology

## 2022-03-14 DIAGNOSIS — C9000 Multiple myeloma not having achieved remission: Secondary | ICD-10-CM

## 2022-03-14 MED ORDER — OXYCODONE-ACETAMINOPHEN 10-325 MG PO TABS: 0.5000 | ORAL_TABLET | Freq: Four times a day (QID) | ORAL | 0 refills | Status: DC | PRN

## 2022-03-16 ENCOUNTER — Other Ambulatory Visit: Payer: Self-pay | Admitting: Oncology

## 2022-03-16 ENCOUNTER — Encounter: Payer: Self-pay | Admitting: Oncology

## 2022-03-16 DIAGNOSIS — C9000 Multiple myeloma not having achieved remission: Secondary | ICD-10-CM

## 2022-03-16 MED ORDER — OXYCODONE-ACETAMINOPHEN 10-325 MG PO TABS: 1.0000 | ORAL_TABLET | ORAL | 0 refills | Status: DC | PRN

## 2022-03-19 ENCOUNTER — Inpatient Hospital Stay: Payer: Medicare PPO

## 2022-03-19 DIAGNOSIS — Z5112 Encounter for antineoplastic immunotherapy: Secondary | ICD-10-CM | POA: Diagnosis not present

## 2022-03-19 DIAGNOSIS — C9000 Multiple myeloma not having achieved remission: Secondary | ICD-10-CM

## 2022-03-20 ENCOUNTER — Encounter: Payer: Self-pay | Admitting: Oncology

## 2022-03-20 ENCOUNTER — Inpatient Hospital Stay: Payer: Medicare PPO

## 2022-03-20 LAB — PROTEIN ELECTROPHORESIS, SERUM
A/G Ratio: 1.3 (ref 0.7–1.7)
Albumin ELP: 3.8 g/dL (ref 2.9–4.4)
Alpha-1-Globulin: 0.2 g/dL (ref 0.0–0.4)
Alpha-2-Globulin: 1 g/dL (ref 0.4–1.0)
Beta Globulin: 1 g/dL (ref 0.7–1.3)
Gamma Globulin: 0.8 g/dL (ref 0.4–1.8)
Globulin, Total: 2.9 g/dL (ref 2.2–3.9)
M-Spike, %: 0.4 g/dL — ABNORMAL HIGH
Total Protein ELP: 6.7 g/dL (ref 6.0–8.5)

## 2022-03-20 LAB — KAPPA/LAMBDA LIGHT CHAINS
Kappa free light chain: 4.9 mg/L (ref 3.3–19.4)
Kappa, lambda light chain ratio: 0.05 — ABNORMAL LOW (ref 0.26–1.65)
Lambda free light chains: 94.6 mg/L — ABNORMAL HIGH (ref 5.7–26.3)

## 2022-03-21 ENCOUNTER — Encounter: Payer: Self-pay | Admitting: Oncology

## 2022-03-23 ENCOUNTER — Other Ambulatory Visit: Payer: Self-pay | Admitting: Nurse Practitioner

## 2022-03-23 ENCOUNTER — Encounter: Payer: Self-pay | Admitting: Oncology

## 2022-03-23 DIAGNOSIS — C9000 Multiple myeloma not having achieved remission: Secondary | ICD-10-CM

## 2022-03-23 MED ORDER — OXYCODONE HCL 10 MG PO TABS: 10.0000 mg | ORAL_TABLET | ORAL | 0 refills | Status: DC | PRN

## 2022-03-24 ENCOUNTER — Other Ambulatory Visit: Payer: Self-pay | Admitting: Oncology

## 2022-03-26 ENCOUNTER — Other Ambulatory Visit: Payer: Self-pay

## 2022-03-26 ENCOUNTER — Inpatient Hospital Stay: Payer: Medicare PPO | Admitting: Nurse Practitioner

## 2022-03-26 ENCOUNTER — Ambulatory Visit (INDEPENDENT_AMBULATORY_CARE_PROVIDER_SITE_OTHER): Payer: Medicare PPO | Admitting: Adult Health

## 2022-03-26 ENCOUNTER — Encounter: Payer: Self-pay | Admitting: Adult Health

## 2022-03-26 ENCOUNTER — Inpatient Hospital Stay: Payer: Medicare PPO

## 2022-03-26 ENCOUNTER — Ambulatory Visit
Admission: RE | Admit: 2022-03-26 | Discharge: 2022-03-26 | Disposition: A | Discharge status: Home / Self Care | Payer: Medicare PPO | Source: Ambulatory Visit | Attending: Radiation Oncology | Admitting: Radiation Oncology

## 2022-03-26 ENCOUNTER — Encounter: Payer: Self-pay | Admitting: Nurse Practitioner

## 2022-03-26 ENCOUNTER — Encounter: Payer: Self-pay | Admitting: *Deleted

## 2022-03-26 VITALS — BP 126/81 | HR 60 | Temp 98.1°F | Resp 18 | Ht 74.0 in | Wt 274.0 lb

## 2022-03-26 VITALS — BP 124/77 | HR 76 | Temp 97.0°F | Resp 18 | Ht 74.0 in | Wt 273.1 lb

## 2022-03-26 DIAGNOSIS — F331 Major depressive disorder, recurrent, moderate: Secondary | ICD-10-CM

## 2022-03-26 DIAGNOSIS — G4730 Sleep apnea, unspecified: Secondary | ICD-10-CM | POA: Insufficient documentation

## 2022-03-26 DIAGNOSIS — C9000 Multiple myeloma not having achieved remission: Secondary | ICD-10-CM | POA: Insufficient documentation

## 2022-03-26 DIAGNOSIS — G4700 Insomnia, unspecified: Secondary | ICD-10-CM | POA: Diagnosis not present

## 2022-03-26 DIAGNOSIS — F411 Generalized anxiety disorder: Secondary | ICD-10-CM | POA: Diagnosis not present

## 2022-03-26 DIAGNOSIS — K219 Gastro-esophageal reflux disease without esophagitis: Secondary | ICD-10-CM | POA: Insufficient documentation

## 2022-03-26 DIAGNOSIS — Z7901 Long term (current) use of anticoagulants: Secondary | ICD-10-CM | POA: Insufficient documentation

## 2022-03-26 DIAGNOSIS — Z9181 History of falling: Secondary | ICD-10-CM | POA: Insufficient documentation

## 2022-03-26 DIAGNOSIS — I10 Essential (primary) hypertension: Secondary | ICD-10-CM | POA: Insufficient documentation

## 2022-03-26 DIAGNOSIS — F419 Anxiety disorder, unspecified: Secondary | ICD-10-CM | POA: Insufficient documentation

## 2022-03-26 DIAGNOSIS — Z79899 Other long term (current) drug therapy: Secondary | ICD-10-CM | POA: Insufficient documentation

## 2022-03-26 DIAGNOSIS — Z9484 Stem cells transplant status: Secondary | ICD-10-CM | POA: Insufficient documentation

## 2022-03-26 DIAGNOSIS — Z510 Encounter for antineoplastic radiation therapy: Secondary | ICD-10-CM | POA: Diagnosis present

## 2022-03-26 LAB — CMP (CANCER CENTER ONLY)
ALT: 30 U/L (ref 0–44)
AST: 20 U/L (ref 15–41)
Albumin: 4.4 g/dL (ref 3.5–5.0)
Alkaline Phosphatase: 64 U/L (ref 38–126)
Anion gap: 11 (ref 5–15)
BUN: 21 mg/dL — ABNORMAL HIGH (ref 6–20)
CO2: 25 mmol/L (ref 22–32)
Calcium: 9.7 mg/dL (ref 8.9–10.3)
Chloride: 97 mmol/L — ABNORMAL LOW (ref 98–111)
Creatinine: 1.1 mg/dL (ref 0.61–1.24)
GFR, Estimated: >60 mL/min (ref >60)
Glucose, Bld: 241 mg/dL — ABNORMAL HIGH (ref 70–99)
Potassium: 4.3 mmol/L (ref 3.5–5.1)
Sodium: 133 mmol/L — ABNORMAL LOW (ref 135–145)
Total Bilirubin: 0.4 mg/dL (ref 0.3–1.2)
Total Protein: 7 g/dL (ref 6.5–8.1)

## 2022-03-26 LAB — CBC WITH DIFFERENTIAL (CANCER CENTER ONLY)
Abs Immature Granulocytes: 0.15 10*3/uL — ABNORMAL HIGH (ref 0.00–0.07)
Basophils Absolute: 0 10*3/uL (ref 0.0–0.1)
Basophils Relative: 1 %
Eosinophils Absolute: 0.2 10*3/uL (ref 0.0–0.5)
Eosinophils Relative: 2 %
HCT: 38.8 % — ABNORMAL LOW (ref 39.0–52.0)
Hemoglobin: 13.3 g/dL (ref 13.0–17.0)
Immature Granulocytes: 2 %
Lymphocytes Relative: 12 %
Lymphs Abs: 0.9 10*3/uL (ref 0.7–4.0)
MCH: 29.7 pg (ref 26.0–34.0)
MCHC: 34.3 g/dL (ref 30.0–36.0)
MCV: 86.6 fL (ref 80.0–100.0)
Monocytes Absolute: 0.7 10*3/uL (ref 0.1–1.0)
Monocytes Relative: 10 %
Neutro Abs: 5.4 10*3/uL (ref 1.7–7.7)
Neutrophils Relative %: 73 %
Platelet Count: 250 10*3/uL (ref 150–400)
RBC: 4.48 MIL/uL (ref 4.22–5.81)
RDW: 15.3 % (ref 11.5–15.5)
WBC Count: 7.4 10*3/uL (ref 4.0–10.5)
nRBC: 0 % (ref 0.0–0.2)

## 2022-03-26 MED ORDER — TRAZODONE HCL 50 MG PO TABS: 50.0000 mg | ORAL_TABLET | Freq: Every day | ORAL | 3 refills | Status: AC

## 2022-03-26 MED ORDER — BUPROPION HCL ER (XL) 150 MG PO TB24: ORAL_TABLET | ORAL | 3 refills | Status: AC

## 2022-03-26 NOTE — Progress Notes (Signed)
Histology and Location of Primary Cancer: Multiple Myeloma ? ?Sites of Visceral and Bony Metastatic Disease: Lumbar spine ? ?Location(s) of Symptomatic Metastases: Lumbar spine ? ?Past/Anticipated chemotherapy by medical oncology, if any:  ?Cycle 1 daratumumab, pomalidomide, Decadron 10/19/2019-Cycle 8 daratumumab, pomalidomide, Decadron 05/04/2020 (pomalidomide started 05/14/2020) Cycle 8 daratumumab, pomalidomide, Decadron 05/04/2020 (pomalidomide started 05/14/2020-Cycle 13 daratumumab 10/11/2020, pomalidomide 2 mg 21 days beginning 10/10/2020 ?Cycle 14 daratumumab 11/08/2020, pomalidomide reduced to 1 mg 21 days-Cycle 20 daratumumab 05/09/2021, Pomalidomide 1 mg 21 days beginning 05/12/2021. 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 ?PET 01/12/2022-multiple lytic lesions with mild hypermetabolism, most prominent hypermetabolic lesion at the sacrum ?Cycle 1 carfilzomib/Cytoxan/Decadron 01/29/2022 ?Cycle 2 carfilzomib/Cytoxan/Decadron 02/26/2022 ? 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 ?Pain on a scale of 0-10 is:  2/10 sacrum ? ? ?If Spine Met(s), symptoms, if any, include: ?Bowel/Bladder retention or incontinence (please describe):  No ?Numbness or weakness in extremities (please describe): fingers and toes neuropathy ?Current Decadron regimen, if applicable: Decadron 4 mg once a week with chemotherapy (not today) ? ?Ambulatory status? Walker? Wheelchair?: Ambulates without assistance ? ?SAFETY ISSUES: ?Prior radiation?  No ?Pacemaker/ICD? No ?Possible current  pregnancy? Male ?Is the patient on methotrexate? No ? ?Current Complaints / other details:   ? ? ?

## 2022-03-26 NOTE — Progress Notes (Addendum)
Steve Carter ?[id number] ?03-Dec-1966 ?56 y.o. ? ?Virtual Visit via Telephone Note ? ?I connected with pt on 03/26/22 at  3:00 PM EDT by telephone and verified that I am speaking with the correct person using two identifiers. ?  ?I discussed the limitations, risks, security and privacy concerns of performing an evaluation and management service by telephone and the availability of in person appointments. I also discussed with the patient that there may be a patient responsible charge related to this service. The patient expressed understanding and agreed to proceed. ?  ?I discussed the assessment and treatment plan with the patient. The patient was provided an opportunity to ask questions and all were answered. The patient agreed with the plan and demonstrated an understanding of the instructions. ?  ?The patient was advised to call back or seek an in-person evaluation if the symptoms worsen or if the condition fails to improve as anticipated. ? ?I provided 20 minutes of non-face-to-face time during this encounter.  The patient was located at home.  The provider was located at [facility]. ? ? ?Aloha Gell, NP ? ? ?Subjective:  ? ?Patient ID:  Steve Carter is a 56 y.o. (DOB 1966-10-10) male. ? ?Chief Complaint: No chief complaint on file. ? ? ?HPI ?Steve Carter presents for follow-up of depression, anxiety and insomnia. ?  ?Wife also on call. ?  ?Describes mood today as "ok". Pleasant. Mood symptoms - denies depression, anxiety, and irritability. Mood is stable. Stating "I'm hanging in there". Reports a cancer recurrence - getting chemo and radiation. Feels like medications are working well. Taking current medications as prescribed. ?Energy levels low. Active, unable to establish a regular exercise routine with physical disabilities. ?Enjoys some usual interests and activities. Spending time with family wife - and 2 children - granddaughter - 3 month. [place]. ?Appetite  adequate. Weight stable - 277 pounds. ?Sleeping difficulties - wakes up every night at midnight. Napping during the day - "sometimes".  ?Focus and concentration difficulties. Completing tasks. Managing aspects of household. Out of work for the past 3 years - cancer. ?Denies SI or HI.  ?Denies AH or VH. ? ? ?Review of Systems:  ?Review of Systems  ?Musculoskeletal:  Negative for gait problem.  ?Neurological:  Negative for tremors.  ?Psychiatric/Behavioral:    ?     Please refer to HPI  ? ?Medications: I have reviewed the patient's current medications. ? ?Current Outpatient Medications  ?Medication Sig Dispense Refill  ? ACCU-CHEK GUIDE test strip     ? acyclovir (ZOVIRAX) 400 MG tablet Take 400 mg by mouth 2 (two) times daily.    ? amoxicillin (AMOXIL) 500 MG capsule Take 500 mg by mouth.    ? apixaban (ELIQUIS) 5 MG TABS tablet Take 1 tablet (5 mg total) by mouth 2 (two) times daily. 60 tablet 2  ? ARIPiprazole (ABILIFY) 15 MG tablet Take 1 tablet (15 mg total) by mouth daily. 90 tablet 3  ? Blood Glucose Monitoring Suppl (ACCU-CHEK GUIDE) w/Device KIT     ? buPROPion (WELLBUTRIN XL) 150 MG 24 hr tablet TAKE 3 TABLETS EVERY MORNING 270 tablet 0  ? BYSTOLIC 10 MG tablet Take 10 mg by mouth 2 (two) times daily.    ? dexamethasone (DECADRON) 4 MG tablet Take 20 mg (#5 tab) by mouth on day after each chemotherapy treatment 20 tablet 1  ? dexamethasone (DECADRON) 4 MG tablet Take by mouth.    ? DROPLET PEN NEEDLES 31G X 8 MM MISC     ?  DULoxetine (CYMBALTA) 60 MG capsule Take by mouth.    ? escitalopram (LEXAPRO) 20 MG tablet Take 1 tablet (20 mg total) by mouth daily. 90 tablet 3  ? hydrochlorothiazide (HYDRODIURIL) 25 MG tablet Take 25 mg by mouth daily.    ? insulin glargine (LANTUS) 100 UNIT/ML injection Inject into the skin daily as needed. Based on glucose reading at bedtime-Hold if 150 or lower    ? Lancets Misc. (ACCU-CHEK SOFTCLIX LANCET DEV) KIT     ? lidocaine-prilocaine (EMLA) cream Apply 1 application  topically as needed. Apply 1/2 tablespoon to port site 2 hours prior to stick and cover with Press-and-Seal to numb port site. DO NOT START USING EMLA UNTIL 14 DAYS AFTER PORT PLACED. 30 g 2  ? LORazepam (ATIVAN) 0.5 MG tablet Take 1 tab 30 minutes to 1 hour prior to PET scan; may repeat x 1 2 tablet 0  ? losartan (COZAAR) 100 MG tablet TAKE 1 TABLET BY MOUTH EVERY DAY 30 tablet 0  ? Omeprazole Magnesium (PRILOSEC OTC PO) Take 20 mg daily by mouth.    ? ondansetron (ZOFRAN) 8 MG tablet Take 1 tablet (8 mg total) by mouth every 8 (eight) hours as needed for nausea or vomiting. 30 tablet 1  ? Oxycodone HCl 10 MG TABS Take 1-2 tablets (10-20 mg total) by mouth every 4 (four) hours as needed. 100 tablet 0  ? pomalidomide (POMALYST) 1 MG capsule TAKE 1 CAPSULE BY MOUTH  DAILY FOR 21 DAYS, THEN 7  DAYS OFF 21 capsule 0  ? pregabalin (LYRICA) 300 MG capsule Take by mouth.    ? primidone (MYSOLINE) 250 MG tablet Take by mouth.    ? primidone (MYSOLINE) 50 MG tablet Take 100 mg by mouth at bedtime. Will increase to 100 mg in 1 month    ? prochlorperazine (COMPAZINE) 5 MG tablet Take 1-2 tablets (5-10 mg total) by mouth every 6 (six) hours as needed for nausea or vomiting. 30 tablet 1  ? testosterone cypionate (DEPOTESTOTERONE CYPIONATE) 100 MG/ML injection Inject 200 mg into the muscle every 14 (fourteen) days. For IM use only    ? traZODone (DESYREL) 50 MG tablet Take 1 tablet (50 mg total) by mouth at bedtime. 90 tablet 3  ? ?No current facility-administered medications for this visit.  ? ? ?Medication Side Effects: None ? ?Allergies: No Known Allergies ? ?Past Medical History:  ?Diagnosis Date  ? Anxiety   ? Depression   ? GERD (gastroesophageal reflux disease)   ? Hypertension   ? Sleep apnea   ? uses C-Pap  ? ? ?No family history on file. ? ?Social History  ? ?Socioeconomic History  ? Marital status: Married  ?  Spouse name: Not on file  ? Number of children: Not on file  ? Years of education: Not on file  ? Highest  education level: Not on file  ?Occupational History  ? Not on file  ?Tobacco Use  ? Smoking status: Never  ? Smokeless tobacco: Current  ?  Types: Snuff  ?Vaping Use  ? Vaping Use: Never used  ?Substance and Sexual Activity  ? Alcohol use: Yes  ?  Alcohol/week: 1.0 standard drink  ?  Types: 1 Cans of beer per week  ? Drug use: No  ? Sexual activity: Not on file  ?Other Topics Concern  ? Not on file  ?Social History Narrative  ? Not on file  ? ?Social Determinants of Health  ? ?Financial Resource Strain: Not on file  ?  Food Insecurity: Not on file  ?Transportation Needs: Not on file  ?Physical Activity: Not on file  ?Stress: Not on file  ?Social Connections: Not on file  ?Intimate Partner Violence: Not on file  ? ? ?Past Medical History, Surgical history, Social history, and Family history were reviewed and updated as appropriate.  ? ?Please see review of systems for further details on the patient's review from today.  ? ?Objective:  ? ?Physical Exam:  ?There were no vitals taken for this visit. ? ?Physical Exam ?Constitutional:   ?   General: He is not in acute distress. ?Musculoskeletal:     ?   General: No deformity.  ?Neurological:  ?   Mental Status: He is alert and oriented to person, place, and time.  ?   Coordination: Coordination normal.  ?Psychiatric:     ?   Attention and Perception: Attention and perception normal. He does not perceive auditory or visual hallucinations.     ?   Mood and Affect: Mood normal. Mood is not anxious or depressed. Affect is not labile, blunt, angry or inappropriate.     ?   Speech: Speech normal.     ?   Behavior: Behavior normal.     ?   Thought Content: Thought content normal. Thought content is not paranoid or delusional. Thought content does not include homicidal or suicidal ideation. Thought content does not include homicidal or suicidal plan.     ?   Cognition and Memory: Cognition and memory normal.     ?   Judgment: Judgment normal.  ?   Comments: Insight intact  ? ? ?Lab  Review:  ?   ?Component Value Date/Time  ? NA 133 (L) 03/26/2022 0900  ? NA 136 11/29/2017 0935  ? K 4.3 03/26/2022 0900  ? K 3.5 11/29/2017 0935  ? CL 97 (L) 03/26/2022 0900  ? CO2 25 03/26/2022 0900  ? CO2 2

## 2022-03-26 NOTE — Progress Notes (Signed)
?[place] ?OFFICE PROGRESS NOTE ? ? ?Diagnosis: Multiple myeloma ? ?INTERVAL HISTORY:  ? ?Steve Carter returns as scheduled.  He completed cycle 2 carfilzomib/Cytoxan/Decadron 03/12/2022.  He denies nausea/vomiting.  No mouth sores.  Bowels are moving.  He continues to have significant low back pain.  He is taking oxycodone 10 mg 2 tablets about every 3 hours. ? ?Objective: ? ?Vital signs in last 24 hours: ? ?Blood pressure 126/81, pulse 60, temperature 98.1 ?F (36.7 ?C), temperature source Oral, resp. rate 18, height $RemoveBe'6\' 2"'roZDSaYGu$  (1.88 m), weight 274 lb (124.3 kg), SpO2 100 %. ?  ? ?HEENT: No thrush or ulcers. ?Resp: Lungs clear bilaterally. ?Cardio: Regular rate and rhythm. ?GI: Abdomen soft and nontender.  No hepatosplenomegaly. ?Vascular: Trace lower leg edema bilaterally ? ?Lab Results: ? ?Lab Results  ?Component Value Date  ? WBC 7.4 03/26/2022  ? HGB 13.3 03/26/2022  ? HCT 38.8 (L) 03/26/2022  ? MCV 86.6 03/26/2022  ? PLT 250 03/26/2022  ? NEUTROABS 5.4 03/26/2022  ? ? ?Imaging: ? ?No results found. ? ?Medications: I have reviewed the patient's current medications. ? ?Assessment/Plan: ?Multiple myeloma- IgG lambda serum monoclonal protein, elevated serum lambda light chains ?Bone survey 10/11/2017-lytic lesions noted in the skull, right iliac, left second rib, and mottled appearance of the proximal femurs/pelvis ?Bone marrow biopsy 10/14/2017-hypercellular marrow with plasma cell neoplasm, 82% plasma cells, lambda light chain restricted, hyperdiploid with gains of chromosomes 3, 5, 7, 9, and 11.  FISH panel positive for gain of ATM (+11) ?Cycle 1 RVD 10/18/2017 (Revlimid started 10/23/2017) ?Cycle 2 RVD 11/15/2017 ?Cycle 3 RVD 12/13/2017 ?Cycle 4 RVD 01/14/2018 ?Cycle 5 of RVD 02/14/2018 (Revlimid given for 7 days and 1 week of Velcade), therapy held beginning 02/21/2018 secondary to plan for stem cell therapy ?Bone marrow biopsy 02/20/2018, 1-2% plasma cell ?PET scan 02/20/2018, no malignant range  activity above background, numerous lytic lesions throughout the axial and appendicular skeleton,, left iliac wing fracture ?Melphalan, 200 mg/m? on 03/24/2018 ?Stem cell infusion 03/25/2018 ?[facility] 07/09/2018: Normal lambda light chains, no serum M spike, bone marrow biopsy negative for myeloma, less than 1% plasma cells ?[facility] scan [facility] 07/09/2018- lytic bone lesions, no malignant range activity ?Initiation of maintenance Revlimid, 10 mg, 21/28 days 08/15/2018 ?Cycle 2 maintenance Revlimid 09/12/2018 ?Cycle 3 maintenance Revlimid 10/10/2018 ?Revlimid placed on hold 06/15/2019 due to presyncopal/syncopal episodes and diarrhea ?Revlimid resumed 5 mg 21 days on/7 days off following office visit 06/29/2019 ?Revlimid placed on hold 07/14/2019 ?Revlimid resumed 08/21/2019 ?Bone survey 09/09/2019- no acute findings or clear explanation for right buttock pain.  Lucent lesions in the right pelvis are stable without pathologic fracture.  Evidence of healing lytic lesions in the right scapula, cervical spine spinous processes and left L4 transverse process.  Possible mild progression of lytic lesions within the L1 and L4 vertebral bodies.  Stable lytic lesions in the calvarium. ?PET scan 09/25/2019-new FDG avid bone lesions in the right humeral neck and sacrum similar remaining lytic lesions with FDG activity below background ?Bone marrow biopsy at [facility] on 10/01/2019-5% plasma cells on the bone marrow biopsy suboptimal sample  ?Cycle 1 daratumumab, pomalidomide, Decadron 10/19/2019 ?Zometa 10/19/2019 ?Cycle 2 daratumumab, pomalidomide, Decadron 11/16/2019 (he will begin the pomalidomide 11/17/2019) ?Cycle 3 daratumumab, pomalidomide, and Decadron 12/14/2019 ?Cycle 4 daratumumab, pomalidomide, and Decadron 01/11/2020 ?Cycle 5 daratumumab, pomalidomide, Decadron 02/03/2020 (he began pomalidomide on 02/06/2020) ?Cycle 6 daratumumab, pomalidomide, Decadron 03/02/2020 (pomalidomide starting 03/12/2020) ?Cycle 7  daratumumab, pomalidomide, Decadron 03/29/2020 (  pomalidomide scheduled to start 04/15/2020) ?Cycle 8 daratumumab, pomalidomide, Decadron 05/04/2020 (pomalidomide started 05/14/2020) ?Cycle 9 daratumumab-monthly 06/15/2020, pomalidomide held ?Cycle 10 daratumumab 07/13/2020, pomalidomide 2 mg 21 days beginning 07/15/2020 ?Cycle 11 daratumumab 08/10/2020, pomalidomide 2 mg 21 days beginning 08/12/2020 ?Bone marrow biopsy 09/06/2020-hypocellular bone marrow with a relative erythroid hyperplasia and no increase in plasma cells; MRD 0.0050% ?Cycle 12 daratumumab 09/07/2020, pomalidomide 2 mg 21 days beginning 09/09/2020 ?Cycle 13 daratumumab 10/11/2020, pomalidomide 2 mg 21 days beginning 10/10/2020 ?Cycle 14 daratumumab 11/08/2020, pomalidomide reduced to 1 mg 21 days ?Cycle 15 daratumumab 12/14/2020, Pomalidomide 1 mg 21 days beginning 12/13/2020 ?Cycle 16 daratumumab 01/12/2021, pomalidomide 1 mg 21 days beginning 01/11/2021 ?Cycle 17 daratumumab 02/10/2021, pomalidomide 1 mg 21 days beginning 02/09/2021 ?Cycle 18 daratumumab 03/10/2021, pomalidomide 1 mg 21 days beginning 03/11/2021 ?Cycle 19 daratumumab 04/07/2021, Pomalidomide 1 mg 21 days beginning 04/08/2021 ?Cycle 20 daratumumab 05/09/2021, Pomalidomide 1 mg 21 days beginning 05/12/2021 ?Cycle 21 daratumumab 06/07/2021, pomalidomide held secondary to progressive neuropathy and diarrhea ?Cycle 22 daratumumab 07/05/2021, pomalidomide held secondary to neuropathy ?Cycle 23 daratumumab 08/02/2021, Pomalidomide remains on hold ?Cycle 24 daratumumab 08/30/2021, pomalidomide on hold ?Cycle 25 daratumumab 09/27/2021, pomalidomide on hold ?Cycle 26 daratumumab 10/23/2021, pomalidomide on hold ?Cycle 27 daratumumab 11/20/2021, pomalidomide on hold ?Cycle 28 daratumumab 12/18/2021, Pomalidomide on hold ?Increased serum M spike and serum free lambda light chains December 2022 ?PET 01/12/2022-multiple lytic lesions with mild hypermetabolism, most prominent hypermetabolic lesion at the sacrum ?Cycle 1  carfilzomib/Cytoxan/Decadron 01/29/2022 ?Cycle 2 carfilzomib/Cytoxan/Decadron 02/26/2022 ?02/26/2022-mild increase in serum M spike and serum free lambda light chains ?03/19/2022-further increase in serum free lambda light chains, stable serum M spike ?Referred for palliative radiation 03/26/2022 ?Cycle 3 carfilzomib/Cytoxan/Decadron 03/27/2022 ?  ?2.  Pain secondary to multiple myeloma involving the spine and pelvis-resolved ?MRI of the lumbar spine 10/02/2018- numerous rounded foci in the vertebral bodies, hypertrophy of the L4 transverse process ?  ?3.  Hypertension-losartan dose increased 11/15/2017 ?  ?4.  Depression-improved with Wellbutrin beginning September 2020 ?  ?5.  Altered mental status-improved depression related? ?6. Diabetes ?7.  Recurrent episodes of fall/syncope-etiology unclear-evaluated by neurology at [facility], MRI brain 01/25/2020 with abnormal signal at the left superior frontal gyrus-potentially representing a low-grade glioma, scattered foci of increased T2 white matter signal-likely related to chronic small vessel disease ?Stereotactic brain biopsy at [facility] 03/14/2020-gliosis, no evidence of malignancy ?  ?8.  Peripheral neuropathy-gabapentin started 07/07/2020; reports increase in symptoms 06/07/2021-pomalidomide placed on hold; reports increase in symptoms 08/02/2021 ?  ?9.  Renal insufficiency ?  ?10.  Left lower extremity cellulitis 02/01/2022 ?  ?11.  Occlusive clot in the greater saphenous vein 02/05/2022-Eliquis initiated ?  ?  ? ?Disposition: [name] appears unchanged.  He has completed 2 cycles of carfilzomib/Cytoxan/Decadron.  Light chains were mildly increased on 03/19/2022, M spike stable.  The plan is to proceed with another cycle of carfilzomib/Cytoxan/Decadron.  He will return to begin cycle 3 03/27/2022. ? ?He continues to have significant back pain, now taking pain medication about every 3 hours.  We made a referral to Dr. Tammi Klippel.  He will be seen in radiation oncology later  today. ? ?He will return for follow-up here in approximately 1 month.  We are available to see him sooner if needed. ? ?Patient seen with Dr. Benay Spice. ? ? ? ?[name] ANP/GNP-BC  ? ?03/26/2022  ?10:27 AM ? ?This was a shared v

## 2022-03-26 NOTE — Progress Notes (Signed)
?[place]         [phone]) [phone] ?________________________________ ? ?Initial outpatient Consultation ? ?(Same Day Simulation) ? ? ?Name: Steve Carter MRN: [id number]  ?Date: 03/26/2022  DOB: December 26, 1965 ? ?REFERRING PHYSICIAN: Ladell Pier, MD ? ?DIAGNOSIS: 56 yo gentleman with multiple myeloma and a painful sacrum lesion ? ?  ICD-10-CM   ?1. Multiple myeloma not having achieved remission (HCC)  C90.00   ?  ? ? ?HISTORY OF PRESENT ILLNESS::Steve Carter is a 56 y.o. male who has been treated for multiple myeloma with Dr. Benay Spice with following history:  [name] is a 56 y.o. who was found to have IgG lambda multiple myeloma after undergoing evaluation for acute onset back pain. He experienced the pain after handling a chain saw at work.  ?? He was referred to physical therapy and was compliant with intervention. There was no improvement in his pain, so he had an MRI of the lumbar spine on 10/02/2017 which showed numerous rounded foci in the vertebral bodies, and hypertrophy of L4 transverse process.  ?? 09/07/2017 - He was referred to Dr. Learta Codding for additional evaluation. Laboratory studies showed a hemoglobin of 17.3, WBC of 15.8, Cr of 1.09, albumin of 3.5, beta 2 microglobulin of 1.6, and total protein of 8.7. ?? Initial M-spike unavailable for review but serum free light chain ratio from 10/08/2017 was at 0.01 (kappa of 4.1, lambda of 585.9).  ?? 10/11/2017 - Osseous survey revealed several lytic lesions over skull, 1.2 cm lesion on right iliac bone, deformity with possible focal destruction posterolateral aspect left second rib as well as mild mottled lucent appearance of proximal femurs and anterior pelvis.  ?? 10/14/2017 - Bone marrow biopsy - hypercellular marrow with 82% plasma cell involvement.  ?? FISH showed gain of ATM, hyperdiploidy with gain of chromosomes 4,11,12,13,14,17.  ? ?TREATMENT: ?? Induction therapy with VRD initiated on 10/18/2017 with bortezomib, and  dexamethasone on days 1,8,15 of 28 day cycle. Delay in lenalidomide with cycle 1, started on 10/23/2017. Administered days 1-21 of 28 day cycle.  ?? First dose of Zometa received with cycle 2 of therapy on 11/18/2017. ?? Mr. Streeper was referred to the myeloma clinic at [facility] and seen on 01/13/2018 for autologous stem cell transplant evaluation. After receiving three cycles, serology at that time revealed and M-spike of 0.53, and serum free light chain ratio of 0.26 (kappa of 11.66, lambda of 44.97). Since no initial M-spike is available, it is thought this could reflect a PR.  ?? Therapy held on 02/21/2018 in preparation for autologous stem cell transplant. ? ?PRE-TRANSPLANT EVALUATION:  ?? ASBMT classification: Low risk, HCT-CI score: 1 ?? 2D Echo: 60-65%, PFT's: DLCO 85.6%, FEV1: 90.9% ?? SPEP: 0.2, IFIX: IgG lambda, kappa 9.37, lambda 18.87, ratio 0.50. ?? Bone marrow biopsy: Normocellular marrow with 1-2% polytypic plasma cells. ?? PET/CT scan:  ?? No substantial malignant range activity above background.  ?? Age-indeterminate left second rib fracture.  ?? Destructive, mixed lytic and sclerotic lesion of the left posterior eighth rib, with likely fracture at this site. Numerous multifocal lytic lesions throughout the cervical and thoracolumbar spine, ribs, sternum, shoulder girdles and proximal humeri, and extensively involving the pelvis and proximal femurs.  ?? The lucent pelvic lesions but the patient at risk for pathologic fracture.  ?? A proximal right humeral neck lucent lesion measures 3.7 x 3.4 cm.  ?? A proximal left humeral neck lytic lesion measures 3 x 3.5 cm. ?? A lytic lesion in the right acetabulum  measures 3.7 x 2.6 cm.  ?? A left supra-acetabular iliac bone lytic lesion measures 3.9 x 1.7 cm.  ?? There is a 3.8 x 1.3 cm left mid-iliac bone lucent lesion.  ?? These 2 left-sided lesions are likely connected and measure up to 6.7 cm in largest coronal dimension.  ?? Linear lucency through the  left iliac wing. ?? Disease status at time of transplant: VGPR ? ?AUTOLOGOUS STEM CELL TRANSPLANT: ?? Mobilization with filgrastim and Mozobil yielding 1.74x10(7) CD34+ cells/kg on 03/19/2018 ?? Hypertensive emergency post stem cell collection on 03/19/2018. Seen in ED and labetolol added to medication regiman ?? Preparative regimen: Melphalan 200 mg/m2 IV on 03/24/2018 ?? Infusion of autologous stem cells on 03/25/2018 - 4.34 x 10(6) ?? Elevated blood glucose- managed with intermittent insulin. Improvement after completing dexamethasone.  ?? Admitted 04/02/2018 with neutropenic fever. ? ?ENGRAFTMENT: ?? WBC engraftment on 04/06/18 and platelet engraftment on 04/14/18. ?? Last platelet transfusion given 04/07/18. ? ?POST TRANSPLANT COURSE: ?? Day +100 evaluation revealed sCR ?? Maintenance lenalidomide 10 mg 21/28-day cycles started 07/2018 and remained in remission and tolerating therapy during his 1-year visit. Lenalidomide placed on hold 06/15/2019 due to presyncopal/syncopal episodes and diarrhea but resumed at 5 mg 21 day cycle on 06/29/2019.  ?? 09/21/19 laboratory studies showed chemical relapse with M-spike of 0.3.  ?? 09/25/19 PET/CT showed new FDG avid lesions in right humerus and sacrum consistent with relapsed disease.  ?? 10/01/2019 - Bone marrow biopsy revealed 5% plasma cells. ?? 10/19/2019- transitioned to daratumumab, pomalidomide, and dexamethasone due to relapsed disease (new lesions noted on PETCT scan). He tolerated treatment without complication.  ?? On 03/07/2020, serology showed faint IgG spike thought to be dara interference and dexamethasone was dropped given excellent response and worsening fatigue/weakness following treatments. ?? 09/06/2020 - Bone marrow, SPEP, routine IFIX, all consistent with CR, however MRD remained positive and dara specific IFIX showed a "faint" IgG, Lambda [original clone] ?? 11/08/2020 C14D1 - pomalidomide reduced to $RemoveBe'1mg'VDqbkpOak$  and subsequently discontinued around June 2022 to assess  relationship to ongoing fatigue ?? 01/29/22: patient progressing on dara, so transitioned to Carfilzomib/Cytoxan/Dex ?? 03/26/22: Completed C2 of cy/car/dex ? ?He is complaining of sacral pain requiring continuous oxycodone 10-20 mg every 4 hours.  He has kindly been referred today for consideration of palliative radiotherapy to the sacral lesion seen here on PET CT 01/11/22 ? ? ? ? ?. ? ?PREVIOUS RADIATION THERAPY: No ? ?Past Medical History:  ?Diagnosis Date  ? Anxiety   ? Depression   ? GERD (gastroesophageal reflux disease)   ? Hypertension   ? Sleep apnea   ? uses C-Pap  ?: ? ? ?Past Surgical History:  ?Procedure Laterality Date  ? CHOLECYSTECTOMY    ?: ? ? ?Current Outpatient Medications:  ?  acyclovir (ZOVIRAX) 400 MG tablet, Take 400 mg by mouth 2 (two) times daily., Disp: , Rfl:  ?  amoxicillin (AMOXIL) 500 MG capsule, Take 500 mg by mouth., Disp: , Rfl:  ?  apixaban (ELIQUIS) 5 MG TABS tablet, Take 1 tablet (5 mg total) by mouth 2 (two) times daily., Disp: 60 tablet, Rfl: 2 ?  ARIPiprazole (ABILIFY) 15 MG tablet, Take 1 tablet (15 mg total) by mouth daily., Disp: 90 tablet, Rfl: 3 ?  buPROPion (WELLBUTRIN XL) 150 MG 24 hr tablet, TAKE 3 TABLETS EVERY MORNING, Disp: 270 tablet, Rfl: 0 ?  BYSTOLIC 10 MG tablet, Take 10 mg by mouth 2 (two) times daily., Disp: , Rfl:  ?  dexamethasone (DECADRON) 4  MG tablet, Take 20 mg (#5 tab) by mouth on day after each chemotherapy treatment, Disp: 20 tablet, Rfl: 1 ?  escitalopram (LEXAPRO) 20 MG tablet, Take 1 tablet (20 mg total) by mouth daily., Disp: 90 tablet, Rfl: 3 ?  hydrochlorothiazide (HYDRODIURIL) 25 MG tablet, Take 25 mg by mouth daily., Disp: , Rfl:  ?  insulin glargine (LANTUS) 100 UNIT/ML injection, Inject into the skin daily as needed. Based on glucose reading at bedtime-Hold if 150 or lower, Disp: , Rfl:  ?  lidocaine-prilocaine (EMLA) cream, Apply 1 application topically as needed. Apply 1/2 tablespoon to port site 2 hours prior to stick and cover with  Press-and-Seal to numb port site. DO NOT START USING EMLA UNTIL 14 DAYS AFTER PORT PLACED., Disp: 30 g, Rfl: 2 ?  LORazepam (ATIVAN) 0.5 MG tablet, Take 1 tab 30 minutes to 1 hour prior to PET scan; may repe

## 2022-03-26 NOTE — Addendum Note (Signed)
Addended by: Aloha Gell on: 03/26/2022 04:02 PM ? ? Modules accepted: Level of Service ? ?

## 2022-03-26 NOTE — Progress Notes (Signed)
Patient seen by [name] NP today ? ?Vitals are within treatment parameters. ? ?Labs reviewed by [name] NP and are within treatment parameters. ? ?Per physician team, patient will not be receiving treatment today.  ?Will reschedule chemo for tomorrow. Needs to see radiation oncology today at 12:30 for urgent consult ? ?

## 2022-03-26 NOTE — Progress Notes (Addendum)
?  Radiation Oncology         [phone] ?________________________________ ? ?Name: Steve Carter MRN: [id number]  ?Date: 03/26/2022  DOB: 10-11-1966 ? ?SIMULATION AND TREATMENT PLANNING NOTE ? ?  ICD-10-CM   ?1. Multiple myeloma not having achieved remission (HCC)  C90.00   ?  ? ? ?DIAGNOSIS:  56 yo gentleman with multiple myeloma and a painful sacrum lesion ? ?NARRATIVE:  The patient was brought to the [facility].  Identity was confirmed.  All relevant records and images related to the planned course of therapy were reviewed.  The patient freely provided informed written consent to proceed with treatment after reviewing the details related to the planned course of therapy. The consent form was witnessed and verified by the simulation staff.  Then, the patient was set-up in a stable reproducible  supine position for radiation therapy.  CT images were obtained.  Surface markings were placed.  The CT images were loaded into the planning software.  Then the target and avoidance structures were contoured including kidneys.  Treatment planning then occurred.  The radiation prescription was entered and confirmed.  Then, I designed and supervised the construction of a total of 6 multiple medically necessary complex treatment devices with leg positioner and 5 MLCs to shield kidneys, specified in the [place] technical note.  I have requested : 3D Simulation  I have requested a DVH of the following structures: Left Kidney, Right Kidney, bladder, rectum, bowel and target. ? ?PLAN:  The patient will receive 20 Gy in 4 fractions. ? ?________________________________ ? ?Sheral Apley Tammi Klippel, M.D. ? ?

## 2022-03-27 ENCOUNTER — Other Ambulatory Visit: Payer: Self-pay

## 2022-03-27 ENCOUNTER — Inpatient Hospital Stay: Payer: Medicare PPO

## 2022-03-27 ENCOUNTER — Ambulatory Visit
Admission: RE | Admit: 2022-03-27 | Discharge: 2022-03-27 | Disposition: A | Discharge status: Home / Self Care | Payer: Medicare PPO | Source: Ambulatory Visit | Attending: Radiation Oncology | Admitting: Radiation Oncology

## 2022-03-27 VITALS — BP 123/77 | HR 70 | Temp 98.2°F | Resp 18

## 2022-03-27 DIAGNOSIS — C9000 Multiple myeloma not having achieved remission: Secondary | ICD-10-CM

## 2022-03-27 DIAGNOSIS — Z510 Encounter for antineoplastic radiation therapy: Secondary | ICD-10-CM | POA: Diagnosis not present

## 2022-03-27 LAB — KAPPA/LAMBDA LIGHT CHAINS
Kappa free light chain: 5.3 mg/L (ref 3.3–19.4)
Kappa, lambda light chain ratio: 0.06 — ABNORMAL LOW (ref 0.26–1.65)
Lambda free light chains: 96.3 mg/L — ABNORMAL HIGH (ref 5.7–26.3)

## 2022-03-27 LAB — RAD ONC ARIA SESSION SUMMARY
Course Elapsed Days: 0
Plan Fractions Treated to Date: 1
Plan Prescribed Dose Per Fraction: 5 Gy
Plan Total Fractions Prescribed: 4
Plan Total Prescribed Dose: 20 Gy
Reference Point Dosage Given to Date: 5 Gy
Reference Point Session Dosage Given: 5 Gy
Session Number: 1

## 2022-03-27 MED ORDER — SODIUM CHLORIDE 0.9 % IV SOLN: Freq: Once | INTRAVENOUS | Status: AC

## 2022-03-27 MED ORDER — DEXTROSE 5 % IV SOLN
56.0000 mg/m2 | Freq: Once | INTRAVENOUS | Status: AC
  Administered 2022-03-27: 120 mg via INTRAVENOUS
  Filled 2022-03-27: qty 60

## 2022-03-27 MED ORDER — DEXAMETHASONE 4 MG PO TABS
20.0000 mg | ORAL_TABLET | Freq: Once | ORAL | Status: AC
  Administered 2022-03-27: 20 mg via ORAL
  Filled 2022-03-27: qty 5

## 2022-03-27 MED ORDER — SODIUM CHLORIDE 0.9 % IV SOLN: Freq: Once | INTRAVENOUS | Status: DC

## 2022-03-27 MED ORDER — SODIUM CHLORIDE 0.9 % IV SOLN
500.0000 mg | Freq: Once | INTRAVENOUS | Status: AC
  Administered 2022-03-27: 500 mg via INTRAVENOUS
  Filled 2022-03-27: qty 25

## 2022-03-27 NOTE — Patient Instructions (Signed)
[facility]   ?Discharge Instructions: ?Thank you for choosing [facility] to provide your oncology and hematology care.  ? ?If you have a lab appointment with the [facility], please go directly to the [facility] and check in at the registration area. ?  ?Wear comfortable clothing and clothing appropriate for easy access to any Portacath or PICC line.  ? ?We strive to give you quality time with your provider. You may need to reschedule your appointment if you arrive late (15 or more minutes).  Arriving late affects you and other patients whose appointments are after yours.  Also, if you miss three or more appointments without notifying the office, you may be dismissed from the clinic at the provider?s discretion.    ?  ?For prescription refill requests, have your pharmacy contact our office and allow 72 hours for refills to be completed.   ? ?Today you received the following chemotherapy and/or immunotherapy agents Cytoxan, Kyprolis    ?  ?To help prevent nausea and vomiting after your treatment, we encourage you to take your nausea medication as directed. ? ?BELOW ARE SYMPTOMS THAT SHOULD BE REPORTED IMMEDIATELY: ?*FEVER GREATER THAN 100.4 F (38 ?C) OR HIGHER ?*CHILLS OR SWEATING ?*NAUSEA AND VOMITING THAT IS NOT CONTROLLED WITH YOUR NAUSEA MEDICATION ?*UNUSUAL SHORTNESS OF BREATH ?*UNUSUAL BRUISING OR BLEEDING ?*URINARY PROBLEMS (pain or burning when urinating, or frequent urination) ?*BOWEL PROBLEMS (unusual diarrhea, constipation, pain near the anus) ?TENDERNESS IN MOUTH AND THROAT WITH OR WITHOUT PRESENCE OF ULCERS (sore throat, sores in mouth, or a toothache) ?UNUSUAL RASH, SWELLING OR PAIN  ?UNUSUAL VAGINAL DISCHARGE OR ITCHING  ? ?Items with * indicate a potential emergency and should be followed up as soon as possible or go to the Emergency Department if any problems should occur. ? ?Please show the CHEMOTHERAPY ALERT CARD or IMMUNOTHERAPY ALERT CARD at  check-in to the Emergency Department and triage nurse. ? ?Should you have questions after your visit or need to cancel or reschedule your appointment, please contact [facility]  Dept: [phone]  and follow the prompts.  Office hours are 8:00 a.m. to 4:30 p.m. Monday - Friday. Please note that voicemails left after 4:00 p.m. may not be returned until the following business day.  We are closed weekends and major holidays. You have access to a nurse at all times for urgent questions. Please call the main number to the clinic Dept: [phone] and follow the prompts. ? ? ?For any non-urgent questions, you may also contact your provider using MyChart. We now offer e-Visits for anyone 69 and older to request care online for non-urgent symptoms. For details visit [email or web address]. ?  ?Also download the MyChart app! Go to the app store, search "MyChart", open the app, select [facility], and log in with your MyChart username and password. ? ?Due to Covid, a mask is required upon entering the hospital/clinic. If you do not have a mask, one will be given to you upon arrival. For doctor visits, patients may have 1 support person aged 33 or older with them. For treatment visits, patients cannot have anyone with them due to current Covid guidelines and our immunocompromised population.  ? ?Cyclophosphamide Injection ?What is this medication? ?CYCLOPHOSPHAMIDE (sye kloe FOSS fa mide) is a chemotherapy drug. It slows the growth of cancer cells. This medicine is used to treat many types of cancer like lymphoma, myeloma, leukemia, breast cancer, and ovarian cancer, to name a few. ?This  medicine may be used for other purposes; ask your health care provider or pharmacist if you have questions. ?COMMON BRAND NAME(S): Cyclophosphamide, Cytoxan, Neosar ?What should I tell my care team before I take this medication? ?They need to know if you have any of these conditions: ?heart disease ?history of  irregular heartbeat ?infection ?kidney disease ?liver disease ?low blood counts, like white cells, platelets, or red blood cells ?on hemodialysis ?recent or ongoing radiation therapy ?scarring or thickening of the lungs ?trouble passing urine ?an unusual or allergic reaction to cyclophosphamide, other medicines, foods, dyes, or preservatives ?pregnant or trying to get pregnant ?breast-feeding ?How should I use this medication? ?This drug is usually given as an injection into a vein or muscle or by infusion into a vein. It is administered in a hospital or clinic by a specially trained health care professional. ?Talk to your pediatrician regarding the use of this medicine in children. Special care may be needed. ?Overdosage: If you think you have taken too much of this medicine contact a poison control center or emergency room at once. ?NOTE: This medicine is only for you. Do not share this medicine with others. ?What if I miss a dose? ?It is important not to miss your dose. Call your doctor or health care professional if you are unable to keep an appointment. ?What may interact with this medication? ?amphotericin B ?azathioprine ?certain antivirals for HIV or hepatitis ?certain medicines for blood pressure, heart disease, irregular heart beat ?certain medicines that treat or prevent blood clots like warfarin ?certain other medicines for cancer ?cyclosporine ?etanercept ?indomethacin ?medicines that relax muscles for surgery ?medicines to increase blood counts ?metronidazole ?This list may not describe all possible interactions. Give your health care provider a list of all the medicines, herbs, non-prescription drugs, or dietary supplements you use. Also tell them if you smoke, drink alcohol, or use illegal drugs. Some items may interact with your medicine. ?What should I watch for while using this medication? ?Your condition will be monitored carefully while you are receiving this medicine. ?You may need blood work  done while you are taking this medicine. ?Drink water or other fluids as directed. Urinate often, even at night. ?Some products may contain alcohol. Ask your health care professional if this medicine contains alcohol. Be sure to tell all health care professionals you are taking this medicine. Certain medicines, like metronidazole and disulfiram, can cause an unpleasant reaction when taken with alcohol. The reaction includes flushing, headache, nausea, vomiting, sweating, and increased thirst. The reaction can last from 30 minutes to several hours. ?Do not become pregnant while taking this medicine or for 1 year after stopping it. Women should inform their health care professional if they wish to become pregnant or think they might be pregnant. Men should not father a child while taking this medicine and for 4 months after stopping it. There is potential for serious side effects to an unborn child. Talk to your health care professional for more information. ?Do not breast-feed an infant while taking this medicine or for 1 week after stopping it. ?This medicine has caused ovarian failure in some women. This medicine may make it more difficult to get pregnant. Talk to your health care professional if you are concerned about your fertility. ?This medicine has caused decreased sperm counts in some men. This may make it more difficult to father a child. Talk to your health care professional if you are concerned about your fertility. ?Call your health care professional for advice if you  get a fever, chills, or sore throat, or other symptoms of a cold or flu. Do not treat yourself. This medicine decreases your body's ability to fight infections. Try to avoid being around people who are sick. ?Avoid taking medicines that contain aspirin, acetaminophen, ibuprofen, naproxen, or ketoprofen unless instructed by your health care professional. These medicines may hide a fever. ?Talk to your health care professional about your risk  of cancer. You may be more at risk for certain types of cancer if you take this medicine. ?If you are going to need surgery or other procedure, tell your health care professional that you are using this medic

## 2022-03-27 NOTE — Progress Notes (Signed)
Patient presents for treatment. RN assessment completed along with the following: ? ?Labs/vitals reviewed - Yes, and within treatment parameters.   ?Weight within 10% of previous measurement - Yes ?Informed consent completed and reflects current therapy/intent - Yes, on date 10/09/21             ?Provider progress note reviewed - Patient not seen by provider today. Most recent note dated 03/26/22 reviewed. ?Treatment/Antibody/Supportive plan reviewed - Yes, and there are no adjustments needed for today's treatment. ?S&H and other orders reviewed - Yes, and there are no additional orders identified. ?Previous treatment date reviewed - Yes, and the appropriate amount of time has elapsed between treatments. ?Clinic Hand Off Received from - Cristy Friedlander, RN ? ?Patient to proceed with treatment.  ? ?

## 2022-03-28 ENCOUNTER — Other Ambulatory Visit: Payer: Self-pay

## 2022-03-28 ENCOUNTER — Ambulatory Visit
Admission: RE | Admit: 2022-03-28 | Discharge: 2022-03-28 | Disposition: A | Discharge status: Home / Self Care | Payer: Medicare PPO | Source: Ambulatory Visit | Attending: Radiation Oncology | Admitting: Radiation Oncology

## 2022-03-28 DIAGNOSIS — Z510 Encounter for antineoplastic radiation therapy: Secondary | ICD-10-CM | POA: Diagnosis not present

## 2022-03-28 LAB — PROTEIN ELECTROPHORESIS, SERUM
A/G Ratio: 1.4 (ref 0.7–1.7)
Albumin ELP: 3.9 g/dL (ref 2.9–4.4)
Alpha-1-Globulin: 0.2 g/dL (ref 0.0–0.4)
Alpha-2-Globulin: 1 g/dL (ref 0.4–1.0)
Beta Globulin: 0.7 g/dL (ref 0.7–1.3)
Gamma Globulin: 0.7 g/dL (ref 0.4–1.8)
Globulin, Total: 2.7 g/dL (ref 2.2–3.9)
M-Spike, %: 0.5 g/dL — ABNORMAL HIGH
Total Protein ELP: 6.6 g/dL (ref 6.0–8.5)

## 2022-03-28 LAB — RAD ONC ARIA SESSION SUMMARY
Course Elapsed Days: 1
Plan Fractions Treated to Date: 2
Plan Prescribed Dose Per Fraction: 5 Gy
Plan Total Fractions Prescribed: 4
Plan Total Prescribed Dose: 20 Gy
Reference Point Dosage Given to Date: 10 Gy
Reference Point Session Dosage Given: 5 Gy
Session Number: 2

## 2022-03-29 ENCOUNTER — Other Ambulatory Visit: Payer: Self-pay

## 2022-03-29 ENCOUNTER — Ambulatory Visit
Admission: RE | Admit: 2022-03-29 | Discharge: 2022-03-29 | Disposition: A | Discharge status: Home / Self Care | Payer: Medicare PPO | Source: Ambulatory Visit | Attending: Radiation Oncology | Admitting: Radiation Oncology

## 2022-03-29 DIAGNOSIS — Z510 Encounter for antineoplastic radiation therapy: Secondary | ICD-10-CM | POA: Diagnosis not present

## 2022-03-29 LAB — RAD ONC ARIA SESSION SUMMARY
Course Elapsed Days: 2
Plan Fractions Treated to Date: 3
Plan Prescribed Dose Per Fraction: 5 Gy
Plan Total Fractions Prescribed: 4
Plan Total Prescribed Dose: 20 Gy
Reference Point Dosage Given to Date: 15 Gy
Reference Point Session Dosage Given: 5 Gy
Session Number: 3

## 2022-03-30 ENCOUNTER — Other Ambulatory Visit: Payer: Self-pay

## 2022-03-30 ENCOUNTER — Ambulatory Visit
Admission: RE | Admit: 2022-03-30 | Discharge: 2022-03-30 | Disposition: A | Discharge status: Home / Self Care | Payer: Medicare PPO | Source: Ambulatory Visit | Attending: Radiation Oncology | Admitting: Radiation Oncology

## 2022-03-30 ENCOUNTER — Encounter: Payer: Self-pay | Admitting: Urology

## 2022-03-30 DIAGNOSIS — Z510 Encounter for antineoplastic radiation therapy: Secondary | ICD-10-CM | POA: Diagnosis not present

## 2022-03-30 DIAGNOSIS — C9000 Multiple myeloma not having achieved remission: Secondary | ICD-10-CM

## 2022-03-30 LAB — RAD ONC ARIA SESSION SUMMARY
Course Elapsed Days: 3
Plan Fractions Treated to Date: 4
Plan Prescribed Dose Per Fraction: 5 Gy
Plan Total Fractions Prescribed: 4
Plan Total Prescribed Dose: 20 Gy
Reference Point Dosage Given to Date: 20 Gy
Reference Point Session Dosage Given: 5 Gy
Session Number: 4

## 2022-04-01 ENCOUNTER — Other Ambulatory Visit: Payer: Self-pay | Admitting: Oncology

## 2022-04-02 ENCOUNTER — Inpatient Hospital Stay: Payer: Medicare PPO | Attending: Nurse Practitioner

## 2022-04-02 ENCOUNTER — Encounter: Payer: Self-pay | Admitting: Urology

## 2022-04-02 ENCOUNTER — Inpatient Hospital Stay: Payer: Medicare PPO

## 2022-04-02 VITALS — BP 128/83 | HR 66 | Temp 98.0°F | Resp 18 | Ht 74.0 in | Wt 276.0 lb

## 2022-04-02 DIAGNOSIS — C9000 Multiple myeloma not having achieved remission: Secondary | ICD-10-CM

## 2022-04-02 DIAGNOSIS — Z5112 Encounter for antineoplastic immunotherapy: Secondary | ICD-10-CM | POA: Insufficient documentation

## 2022-04-02 DIAGNOSIS — Z5111 Encounter for antineoplastic chemotherapy: Secondary | ICD-10-CM | POA: Diagnosis present

## 2022-04-02 DIAGNOSIS — Z79899 Other long term (current) drug therapy: Secondary | ICD-10-CM | POA: Diagnosis not present

## 2022-04-02 LAB — CBC WITH DIFFERENTIAL (CANCER CENTER ONLY)
Abs Immature Granulocytes: 0.17 10*3/uL — ABNORMAL HIGH (ref 0.00–0.07)
Basophils Absolute: 0 10*3/uL (ref 0.0–0.1)
Basophils Relative: 0 %
Eosinophils Absolute: 0.1 10*3/uL (ref 0.0–0.5)
Eosinophils Relative: 1 %
HCT: 37 % — ABNORMAL LOW (ref 39.0–52.0)
Hemoglobin: 12.6 g/dL — ABNORMAL LOW (ref 13.0–17.0)
Immature Granulocytes: 3 %
Lymphocytes Relative: 8 %
Lymphs Abs: 0.4 10*3/uL — ABNORMAL LOW (ref 0.7–4.0)
MCH: 30 pg (ref 26.0–34.0)
MCHC: 34.1 g/dL (ref 30.0–36.0)
MCV: 88.1 fL (ref 80.0–100.0)
Monocytes Absolute: 0.7 10*3/uL (ref 0.1–1.0)
Monocytes Relative: 12 %
Neutro Abs: 4.2 10*3/uL (ref 1.7–7.7)
Neutrophils Relative %: 76 %
Platelet Count: 157 10*3/uL (ref 150–400)
RBC: 4.2 MIL/uL — ABNORMAL LOW (ref 4.22–5.81)
RDW: 16.4 % — ABNORMAL HIGH (ref 11.5–15.5)
WBC Count: 5.5 10*3/uL (ref 4.0–10.5)
nRBC: 0.4 % — ABNORMAL HIGH (ref 0.0–0.2)

## 2022-04-02 LAB — CMP (CANCER CENTER ONLY)
ALT: 30 U/L (ref 0–44)
AST: 24 U/L (ref 15–41)
Albumin: 4.3 g/dL (ref 3.5–5.0)
Alkaline Phosphatase: 54 U/L (ref 38–126)
Anion gap: 10 (ref 5–15)
BUN: 17 mg/dL (ref 6–20)
CO2: 27 mmol/L (ref 22–32)
Calcium: 10 mg/dL (ref 8.9–10.3)
Chloride: 97 mmol/L — ABNORMAL LOW (ref 98–111)
Creatinine: 1.11 mg/dL (ref 0.61–1.24)
GFR, Estimated: >60 mL/min (ref >60)
Glucose, Bld: 233 mg/dL — ABNORMAL HIGH (ref 70–99)
Potassium: 4.5 mmol/L (ref 3.5–5.1)
Sodium: 134 mmol/L — ABNORMAL LOW (ref 135–145)
Total Bilirubin: 0.4 mg/dL (ref 0.3–1.2)
Total Protein: 6.9 g/dL (ref 6.5–8.1)

## 2022-04-02 MED ORDER — DEXAMETHASONE 4 MG PO TABS
20.0000 mg | ORAL_TABLET | Freq: Once | ORAL | Status: AC
  Administered 2022-04-02: 20 mg via ORAL
  Filled 2022-04-02: qty 5

## 2022-04-02 MED ORDER — SODIUM CHLORIDE 0.9 % IV SOLN: Freq: Once | INTRAVENOUS | Status: AC

## 2022-04-02 MED ORDER — DEXTROSE 5 % IV SOLN
56.0000 mg/m2 | Freq: Once | INTRAVENOUS | Status: AC
  Administered 2022-04-02: 120 mg via INTRAVENOUS
  Filled 2022-04-02: qty 60

## 2022-04-02 MED ORDER — SODIUM CHLORIDE 0.9 % IV SOLN: Freq: Once | INTRAVENOUS | Status: DC

## 2022-04-02 MED ORDER — SODIUM CHLORIDE 0.9 % IV SOLN
500.0000 mg | Freq: Once | INTRAVENOUS | Status: AC
  Administered 2022-04-02: 500 mg via INTRAVENOUS
  Filled 2022-04-02: qty 25

## 2022-04-02 NOTE — Progress Notes (Signed)
Patient presents for treatment. RN assessment completed along with the following: ? ?Labs/vitals reviewed - Yes, and within treatment parameters.   ?Weight within 10% of previous measurement - Yes ?Informed consent completed and reflects current therapy/intent - Yes, on date 01/29/22             ?Provider progress note reviewed - Patient not seen by provider today. Most recent note dated 03/26/22 reviewed. ?Treatment/Antibody/Supportive plan reviewed - Yes, and there are no adjustments needed for today's treatment. ?S&H and other orders reviewed - Yes, and there are no additional orders identified. ?Previous treatment date reviewed - Yes, and the appropriate amount of time has elapsed between treatments. ? ?Patient to proceed with treatment.   ?

## 2022-04-03 ENCOUNTER — Telehealth: Payer: Self-pay

## 2022-04-03 NOTE — Telephone Encounter (Signed)
I spoke w/ patient's spouse Jentry Warnell in reference to patient's pain complaints post starting radiation TX's. Under the direction of Ashlyn Bruning PA-C she states "That a pain flair-up is common post starting radiation TX's and that the pain should calm down within the next 3-4 weeks." Ms. Nicklaus Alviar states that Mr. Kees Idrovo is managing pain w/ RX Percocet." I left my extension in case patient needs anything. Spouse verbalized understanding of the conversation. ?

## 2022-04-04 ENCOUNTER — Encounter: Payer: Self-pay | Admitting: Oncology

## 2022-04-04 ENCOUNTER — Encounter: Payer: Self-pay | Admitting: Urology

## 2022-04-04 ENCOUNTER — Telehealth: Payer: Self-pay

## 2022-04-04 NOTE — Telephone Encounter (Signed)
Patient called and sent a My-Chart message requesting his records to be released to himself for insurance purposes. I called patient to confirm this, and verify his identity. Patient lives an hour away and is unavailable for in-person pick-up of his records. He has requested that his records be mailed to his address on file [address] [place], [id number]. I have confirmed the accuracy of this address w/ the patient. Patient has verified this as his address that he would like his records mailed to. I have printed patient's records and mailed them to his confirmed address. Patient has verbalized understanding of this conversation and that he should receive his records in the mail in the next couple days. ?

## 2022-04-05 ENCOUNTER — Encounter: Payer: Self-pay | Admitting: Oncology

## 2022-04-06 ENCOUNTER — Encounter: Payer: Self-pay | Admitting: *Deleted

## 2022-04-06 ENCOUNTER — Other Ambulatory Visit: Payer: Self-pay | Admitting: Nurse Practitioner

## 2022-04-06 ENCOUNTER — Encounter: Payer: Self-pay | Admitting: Oncology

## 2022-04-06 DIAGNOSIS — C9000 Multiple myeloma not having achieved remission: Secondary | ICD-10-CM

## 2022-04-06 MED ORDER — OXYCODONE HCL 10 MG PO TABS: 10.0000 mg | ORAL_TABLET | ORAL | 0 refills | Status: DC | PRN

## 2022-04-06 NOTE — Progress Notes (Signed)
Per Mrs. Twardowski, patient feels the pain control is better with the Percocet 10/325 in past. Currently takes Oxycodone 10 mg #2 and she adds Tylenol to this and tries to keep him at 1500 mg/day of tylenol. Understands it will be difficult to keep him at safe limit if oxycodone is with tylenol--once saft limit is reached, she would have no way to give the oxycodone, which could cause him to have pain crisis. ?NP wants him to have only 2000 mg to 3000 mg tylenol maximum. Will refill the plain oxycodone and she can continue to supplement w/OTC tylenol. ?

## 2022-04-09 ENCOUNTER — Inpatient Hospital Stay: Payer: Medicare PPO

## 2022-04-16 ENCOUNTER — Encounter: Payer: Self-pay | Admitting: Oncology

## 2022-04-17 ENCOUNTER — Other Ambulatory Visit: Payer: Self-pay | Admitting: Nurse Practitioner

## 2022-04-17 ENCOUNTER — Encounter: Payer: Self-pay | Admitting: Oncology

## 2022-04-17 DIAGNOSIS — C9000 Multiple myeloma not having achieved remission: Secondary | ICD-10-CM

## 2022-04-17 MED ORDER — OXYCODONE HCL 5 MG PO TABS: 10.0000 mg | ORAL_TABLET | ORAL | 0 refills | Status: DC | PRN

## 2022-04-20 ENCOUNTER — Other Ambulatory Visit: Payer: Self-pay | Admitting: Nurse Practitioner

## 2022-04-20 DIAGNOSIS — C9000 Multiple myeloma not having achieved remission: Secondary | ICD-10-CM

## 2022-04-20 DIAGNOSIS — I82890 Acute embolism and thrombosis of other specified veins: Secondary | ICD-10-CM

## 2022-04-24 ENCOUNTER — Inpatient Hospital Stay: Payer: Medicare PPO | Admitting: Oncology

## 2022-04-24 ENCOUNTER — Inpatient Hospital Stay: Payer: Medicare PPO

## 2022-04-26 ENCOUNTER — Encounter: Payer: Self-pay | Admitting: Oncology

## 2022-04-26 ENCOUNTER — Other Ambulatory Visit: Payer: Self-pay | Admitting: Nurse Practitioner

## 2022-04-26 DIAGNOSIS — C9000 Multiple myeloma not having achieved remission: Secondary | ICD-10-CM

## 2022-04-27 ENCOUNTER — Other Ambulatory Visit: Payer: Self-pay | Admitting: Nurse Practitioner

## 2022-04-27 DIAGNOSIS — C9000 Multiple myeloma not having achieved remission: Secondary | ICD-10-CM

## 2022-04-27 MED ORDER — OXYCODONE HCL 5 MG PO TABS: 10.0000 mg | ORAL_TABLET | ORAL | 0 refills | Status: DC | PRN

## 2022-05-01 ENCOUNTER — Other Ambulatory Visit: Payer: Self-pay | Admitting: Oncology

## 2022-05-01 ENCOUNTER — Encounter: Payer: Self-pay | Admitting: Urology

## 2022-05-01 ENCOUNTER — Inpatient Hospital Stay: Payer: Medicare PPO

## 2022-05-01 NOTE — Progress Notes (Signed)
Radiation Oncology         [phone] ________________________________  Name: Steve Carter MRN: [id number]  Date: 05/02/2022  DOB: [birth year]  Post Treatment Note  CC: Tempie Hoist, FNP  Tempie Hoist, FNP  Diagnosis:   56 yo gentleman with multiple myeloma and a painful sacrum lesion  Interval Since Last Radiation:  4.5 weeks  03/27/22 - 03/30/22: The painful lesion in the sacrum was treated to 20 Gy in 4 fractions of 5 Gy each.  Narrative:  I spoke with the patient to conduct his routine scheduled 1 month follow up visit via telephone to spare the patient unnecessary potential exposure in the healthcare setting during the current COVID-19 pandemic.  The patient was notified in advance and gave permission to proceed with this visit format.  He tolerated radiation treatment relatively well.                                 On review of systems, the patient states that he is doing well in general.  He has noticed significant improvement in his pain but does continue with mild pain around a 2-3/10 on the pain scale.  He continues taking pain medications every 4-5 hours and feels like his pain is very well controlled at this point.  He denies any new areas of pain and specifically denies any focal weakness, paresthesias or changes in bowel or bladder function.  Overall, he is pleased with his progress to date.  Dr. Benay Spice has recently discontinued his systemic therapy and he is looking to enroll in a clinical trial.  He is scheduled for repeat labs on 05/07/2022 and will follow-up with Dr. Benay Spice thereafter.  ALLERGIES:  has No Known Allergies.  Meds: Current Outpatient Medications  Medication Sig Dispense Refill   ACCU-CHEK GUIDE test strip      acyclovir (ZOVIRAX) 400 MG tablet Take 400 mg by mouth 2 (two) times daily.     amoxicillin (AMOXIL) 500 MG capsule Take 500 mg by mouth.     ARIPiprazole (ABILIFY) 15 MG tablet Take 1 tablet (15 mg total) by mouth daily. 90 tablet 3    Blood Glucose Monitoring Suppl (ACCU-CHEK GUIDE) w/Device KIT      buPROPion (WELLBUTRIN XL) 150 MG 24 hr tablet TAKE 3 TABLETS EVERY MORNING 270 tablet 3   BYSTOLIC 10 MG tablet Take 10 mg by mouth 2 (two) times daily.     dexamethasone (DECADRON) 4 MG tablet Take 20 mg (#5 tab) by mouth on day after each chemotherapy treatment 20 tablet 1   dexamethasone (DECADRON) 4 MG tablet Take by mouth.     DROPLET PEN NEEDLES 31G X 8 MM MISC      DULoxetine (CYMBALTA) 60 MG capsule Take by mouth.     ELIQUIS 5 MG TABS tablet TAKE 1 TABLET BY MOUTH TWICE DAILY 60 tablet 2   escitalopram (LEXAPRO) 20 MG tablet Take 1 tablet (20 mg total) by mouth daily. 90 tablet 3   hydrochlorothiazide (HYDRODIURIL) 25 MG tablet Take 25 mg by mouth daily.     insulin glargine (LANTUS) 100 UNIT/ML injection Inject into the skin daily as needed. Based on glucose reading at bedtime-Hold if 150 or lower     Lancets Misc. (ACCU-CHEK SOFTCLIX LANCET DEV) KIT      lidocaine-prilocaine (EMLA) cream Apply 1 application topically as needed. Apply 1/2 tablespoon to port site 2 hours prior to stick and cover  with Press-and-Seal to numb port site. DO NOT START USING EMLA UNTIL 14 DAYS AFTER PORT PLACED. 30 g 2   LORazepam (ATIVAN) 0.5 MG tablet Take 1 tab 30 minutes to 1 hour prior to PET scan; may repeat x 1 2 tablet 0   losartan (COZAAR) 100 MG tablet TAKE 1 TABLET BY MOUTH EVERY DAY 30 tablet 0   Omeprazole Magnesium (PRILOSEC OTC PO) Take 20 mg daily by mouth.     ondansetron (ZOFRAN) 8 MG tablet Take 1 tablet (8 mg total) by mouth every 8 (eight) hours as needed for nausea or vomiting. 30 tablet 1   oxyCODONE (OXY IR/ROXICODONE) 5 MG immediate release tablet Take 2-4 tablets (10-20 mg total) by mouth every 4 (four) hours as needed for severe pain. 100 tablet 0   pomalidomide (POMALYST) 1 MG capsule TAKE 1 CAPSULE BY MOUTH  DAILY FOR 21 DAYS, THEN 7  DAYS OFF 21 capsule 0   pregabalin (LYRICA) 300 MG capsule Take by mouth.      primidone (MYSOLINE) 250 MG tablet Take by mouth.     primidone (MYSOLINE) 50 MG tablet Take 100 mg by mouth at bedtime. Will increase to 100 mg in 1 month     prochlorperazine (COMPAZINE) 5 MG tablet TAKE 1-2 TABLETS (5-10 MG TOTAL) BY MOUTH EVERY 6 (SIX) HOURS AS NEEDED FOR NAUSEA OR VOMITING. 30 tablet 1   testosterone cypionate (DEPOTESTOTERONE CYPIONATE) 100 MG/ML injection Inject 200 mg into the muscle every 14 (fourteen) days. For IM use only     traZODone (DESYREL) 50 MG tablet Take 1 tablet (50 mg total) by mouth at bedtime. 90 tablet 3   No current facility-administered medications for this encounter.    Physical Findings:  vitals were not taken for this visit.  Pain Assessment Pain Score: 3  (Sacrum)/10 Unable to assess due to telephone follow-up visit format.  Lab Findings: Lab Results  Component Value Date   WBC 5.5 04/02/2022   HGB 12.6 (L) 04/02/2022   HCT 37.0 (L) 04/02/2022   MCV 88.1 04/02/2022   PLT 157 04/02/2022     Radiographic Findings: No results found.  Impression/Plan: 1. 56 yo gentleman with multiple myeloma and a painful sacrum lesion. He appears to have recovered well from the effects of his recent palliative radiotherapy and is currently without complaints.  We discussed that while we are happy to continue to participate in his care if clinically indicated, at this point, we will plan to see him back on an as-needed basis.  He will continue in routine follow-up under the care and direction of Dr. Benay Spice but knows that he is welcome to call anytime with any questions or concerns related to his previous radiation.    Nicholos Johns, PA-C

## 2022-05-01 NOTE — Progress Notes (Signed)
Telephone appointment. I spoke w/ patient's spouse Mrs. Rubie Maid, verified her identity and began nursing interview. She reports that patient is having some sacral pain 3/10, managed w/ pain medications. No other issues reported at this time.  Meaningful use complete.  Reminded spouse of patient's 9:30am-05/02/22 telephone appointment w/ Ashlyn Bruning PA-C. I left my extension [phone] in case patient needs anything. Mrs. Stuard verbalized understanding.  Patient preferred contact [phone]

## 2022-05-01 NOTE — Progress Notes (Signed)
  Radiation Oncology         [phone]) [phone] ________________________________  Name: Steve Carter MRN: [id number]  Date: 03/30/2022  DOB: [birth year]  End of Treatment Note  Diagnosis:   56 yo gentleman with multiple myeloma and a painful sacrum lesion     Indication for treatment:  Palliation       Radiation treatment dates:   03/27/22 - 03/30/22  Site/dose:   The painful lesion in the sacrum was treated to 20 Gy in 4 fractions of 5 Gy each.  Beams/energy:   A 3D field set-up was employed with 10 MV X-rays  Narrative: The patient tolerated radiation treatment relatively well.     Plan: The patient has completed radiation treatment. The patient will return to radiation oncology clinic for routine followup in one month. I advised him to call or return sooner if he has any questions or concerns related to his recovery or treatment. ________________________________  Sheral Apley. Tammi Klippel, M.D.

## 2022-05-02 ENCOUNTER — Ambulatory Visit
Admission: RE | Admit: 2022-05-02 | Discharge: 2022-05-02 | Disposition: A | Discharge status: Home / Self Care | Payer: Medicare PPO | Source: Ambulatory Visit | Attending: Urology | Admitting: Urology

## 2022-05-02 DIAGNOSIS — C9000 Multiple myeloma not having achieved remission: Secondary | ICD-10-CM

## 2022-05-07 ENCOUNTER — Encounter: Payer: Self-pay | Admitting: Oncology

## 2022-05-07 ENCOUNTER — Inpatient Hospital Stay: Payer: Medicare PPO

## 2022-05-07 ENCOUNTER — Inpatient Hospital Stay: Payer: Medicare PPO | Attending: Nurse Practitioner

## 2022-05-07 ENCOUNTER — Other Ambulatory Visit: Payer: Self-pay | Admitting: Adult Health

## 2022-05-07 DIAGNOSIS — C9000 Multiple myeloma not having achieved remission: Secondary | ICD-10-CM | POA: Diagnosis present

## 2022-05-07 DIAGNOSIS — F331 Major depressive disorder, recurrent, moderate: Secondary | ICD-10-CM

## 2022-05-07 DIAGNOSIS — F411 Generalized anxiety disorder: Secondary | ICD-10-CM

## 2022-05-07 LAB — CBC WITH DIFFERENTIAL (CANCER CENTER ONLY)
Abs Immature Granulocytes: 0.26 10*3/uL — ABNORMAL HIGH (ref 0.00–0.07)
Basophils Absolute: 0.1 10*3/uL (ref 0.0–0.1)
Basophils Relative: 1 %
Eosinophils Absolute: 0.1 10*3/uL (ref 0.0–0.5)
Eosinophils Relative: 1 %
HCT: 36.2 % — ABNORMAL LOW (ref 39.0–52.0)
Hemoglobin: 12 g/dL — ABNORMAL LOW (ref 13.0–17.0)
Immature Granulocytes: 3 %
Lymphocytes Relative: 9 %
Lymphs Abs: 0.8 10*3/uL (ref 0.7–4.0)
MCH: 31.7 pg (ref 26.0–34.0)
MCHC: 33.1 g/dL (ref 30.0–36.0)
MCV: 95.8 fL (ref 80.0–100.0)
Monocytes Absolute: 0.5 10*3/uL (ref 0.1–1.0)
Monocytes Relative: 6 %
Neutro Abs: 7.2 10*3/uL (ref 1.7–7.7)
Neutrophils Relative %: 80 %
Platelet Count: 217 10*3/uL (ref 150–400)
RBC: 3.78 MIL/uL — ABNORMAL LOW (ref 4.22–5.81)
RDW: 16.5 % — ABNORMAL HIGH (ref 11.5–15.5)
WBC Count: 8.9 10*3/uL (ref 4.0–10.5)
nRBC: 0.5 % — ABNORMAL HIGH (ref 0.0–0.2)

## 2022-05-07 LAB — CMP (CANCER CENTER ONLY)
ALT: 22 U/L (ref 0–44)
AST: 15 U/L (ref 15–41)
Albumin: 4.1 g/dL (ref 3.5–5.0)
Alkaline Phosphatase: 50 U/L (ref 38–126)
Anion gap: 14 (ref 5–15)
BUN: 17 mg/dL (ref 6–20)
CO2: 28 mmol/L (ref 22–32)
Calcium: 9.5 mg/dL (ref 8.9–10.3)
Chloride: 97 mmol/L — ABNORMAL LOW (ref 98–111)
Creatinine: 1.11 mg/dL (ref 0.61–1.24)
GFR, Estimated: >60 mL/min (ref >60)
Glucose, Bld: 249 mg/dL — ABNORMAL HIGH (ref 70–99)
Potassium: 4 mmol/L (ref 3.5–5.1)
Sodium: 139 mmol/L (ref 135–145)
Total Bilirubin: 0.3 mg/dL (ref 0.3–1.2)
Total Protein: 6.8 g/dL (ref 6.5–8.1)

## 2022-05-08 LAB — KAPPA/LAMBDA LIGHT CHAINS
Kappa free light chain: 9.9 mg/L (ref 3.3–19.4)
Kappa, lambda light chain ratio: 0.49 (ref 0.26–1.65)
Lambda free light chains: 20.2 mg/L (ref 5.7–26.3)

## 2022-05-09 LAB — PROTEIN ELECTROPHORESIS, SERUM
A/G Ratio: 1.7 (ref 0.7–1.7)
Albumin ELP: 3.9 g/dL (ref 2.9–4.4)
Alpha-1-Globulin: 0.2 g/dL (ref 0.0–0.4)
Alpha-2-Globulin: 1 g/dL (ref 0.4–1.0)
Beta Globulin: 0.8 g/dL (ref 0.7–1.3)
Gamma Globulin: 0.5 g/dL (ref 0.4–1.8)
Globulin, Total: 2.3 g/dL (ref 2.2–3.9)
M-Spike, %: 0.3 g/dL — ABNORMAL HIGH
Total Protein ELP: 6.2 g/dL (ref 6.0–8.5)

## 2022-05-10 ENCOUNTER — Encounter: Payer: Self-pay | Admitting: Oncology

## 2022-05-10 ENCOUNTER — Other Ambulatory Visit: Payer: Self-pay | Admitting: Nurse Practitioner

## 2022-05-10 DIAGNOSIS — C9000 Multiple myeloma not having achieved remission: Secondary | ICD-10-CM

## 2022-05-10 MED ORDER — OXYCODONE HCL 5 MG PO TABS: 10.0000 mg | ORAL_TABLET | ORAL | 0 refills | Status: DC | PRN

## 2022-05-22 ENCOUNTER — Other Ambulatory Visit: Payer: Self-pay | Admitting: Nurse Practitioner

## 2022-05-22 ENCOUNTER — Encounter: Payer: Self-pay | Admitting: Nurse Practitioner

## 2022-05-22 DIAGNOSIS — C9000 Multiple myeloma not having achieved remission: Secondary | ICD-10-CM

## 2022-05-23 ENCOUNTER — Other Ambulatory Visit: Payer: Self-pay | Admitting: Nurse Practitioner

## 2022-05-23 DIAGNOSIS — C9000 Multiple myeloma not having achieved remission: Secondary | ICD-10-CM

## 2022-05-23 MED ORDER — OXYCODONE HCL 5 MG PO TABS: 10.0000 mg | ORAL_TABLET | ORAL | 0 refills | Status: DC | PRN

## 2022-05-24 ENCOUNTER — Other Ambulatory Visit: Payer: Self-pay

## 2022-05-24 ENCOUNTER — Encounter: Payer: Self-pay | Admitting: Nurse Practitioner

## 2022-05-24 DIAGNOSIS — C9000 Multiple myeloma not having achieved remission: Secondary | ICD-10-CM

## 2022-05-24 DIAGNOSIS — I82890 Acute embolism and thrombosis of other specified veins: Secondary | ICD-10-CM

## 2022-05-24 MED ORDER — APIXABAN 5 MG PO TABS: 5.0000 mg | ORAL_TABLET | Freq: Two times a day (BID) | ORAL | 0 refills | Status: DC

## 2022-05-31 ENCOUNTER — Other Ambulatory Visit: Payer: Self-pay | Admitting: Nurse Practitioner

## 2022-05-31 ENCOUNTER — Encounter: Payer: Self-pay | Admitting: Nurse Practitioner

## 2022-05-31 DIAGNOSIS — C9000 Multiple myeloma not having achieved remission: Secondary | ICD-10-CM

## 2022-06-01 ENCOUNTER — Other Ambulatory Visit: Payer: Self-pay | Admitting: Nurse Practitioner

## 2022-06-01 DIAGNOSIS — C9000 Multiple myeloma not having achieved remission: Secondary | ICD-10-CM

## 2022-06-01 MED ORDER — OXYCODONE HCL 5 MG PO TABS: 10.0000 mg | ORAL_TABLET | ORAL | 0 refills | Status: DC | PRN

## 2022-06-06 ENCOUNTER — Other Ambulatory Visit: Payer: Self-pay

## 2022-06-06 DIAGNOSIS — C9000 Multiple myeloma not having achieved remission: Secondary | ICD-10-CM

## 2022-06-08 ENCOUNTER — Encounter: Payer: Self-pay | Admitting: Oncology

## 2022-06-08 ENCOUNTER — Other Ambulatory Visit: Payer: Self-pay | Admitting: Nurse Practitioner

## 2022-06-08 ENCOUNTER — Other Ambulatory Visit: Payer: Medicare PPO

## 2022-06-08 ENCOUNTER — Ambulatory Visit: Payer: Medicare PPO | Admitting: Oncology

## 2022-06-08 DIAGNOSIS — C9000 Multiple myeloma not having achieved remission: Secondary | ICD-10-CM

## 2022-06-08 MED ORDER — OXYCODONE HCL 5 MG PO TABS: 10.0000 mg | ORAL_TABLET | ORAL | 0 refills | Status: DC | PRN

## 2022-06-11 ENCOUNTER — Encounter: Payer: Self-pay | Admitting: Nurse Practitioner

## 2022-06-12 ENCOUNTER — Inpatient Hospital Stay: Payer: Medicare PPO | Attending: Nurse Practitioner | Admitting: Nurse Practitioner

## 2022-06-12 ENCOUNTER — Encounter: Payer: Self-pay | Admitting: Nurse Practitioner

## 2022-06-12 ENCOUNTER — Ambulatory Visit: Payer: Medicare PPO | Admitting: Oncology

## 2022-06-12 DIAGNOSIS — C9000 Multiple myeloma not having achieved remission: Secondary | ICD-10-CM | POA: Diagnosis present

## 2022-06-12 DIAGNOSIS — F32A Depression, unspecified: Secondary | ICD-10-CM | POA: Insufficient documentation

## 2022-06-12 DIAGNOSIS — M545 Low back pain, unspecified: Secondary | ICD-10-CM | POA: Insufficient documentation

## 2022-06-12 DIAGNOSIS — L03116 Cellulitis of left lower limb: Secondary | ICD-10-CM | POA: Diagnosis not present

## 2022-06-12 DIAGNOSIS — G629 Polyneuropathy, unspecified: Secondary | ICD-10-CM | POA: Insufficient documentation

## 2022-06-12 DIAGNOSIS — E119 Type 2 diabetes mellitus without complications: Secondary | ICD-10-CM | POA: Diagnosis not present

## 2022-06-12 DIAGNOSIS — N289 Disorder of kidney and ureter, unspecified: Secondary | ICD-10-CM | POA: Diagnosis not present

## 2022-06-12 DIAGNOSIS — Z79899 Other long term (current) drug therapy: Secondary | ICD-10-CM | POA: Diagnosis not present

## 2022-06-12 DIAGNOSIS — G893 Neoplasm related pain (acute) (chronic): Secondary | ICD-10-CM | POA: Insufficient documentation

## 2022-06-12 DIAGNOSIS — I10 Essential (primary) hypertension: Secondary | ICD-10-CM | POA: Insufficient documentation

## 2022-06-12 NOTE — Progress Notes (Signed)
[place] OFFICE PROGRESS NOTE   Diagnosis: Multiple myeloma  INTERVAL HISTORY:   Mr. Pflaum returns for follow-up.  He is currently not on specific therapy for myeloma.  He continues to have low back pain.  He estimates taking 2 oxycodone tablets every 5-6 hours.  Bowels moving regularly.  He intermittently notes left leg edema.  No left leg pain.  Objective:  Vital signs in last 24 hours:  Blood pressure 119/76, pulse 79, temperature 98.1 F (36.7 C), temperature source Oral, resp. rate 18, height $RemoveBe'6\' 2"'cyBGJCFyD$  (1.88 m), weight 265 lb (120.2 kg), SpO2 100 %.    HEENT: No thrush or ulcers. Resp: Lungs clear bilaterally. Cardio: Regular rate and rhythm. GI: Abdomen soft and nontender.  No hepatosplenomegaly. Vascular: No leg edema.  Left lower leg appears larger than the right lower leg.   Lab Results:  Lab Results  Component Value Date   WBC 8.9 05/07/2022   HGB 12.0 (L) 05/07/2022   HCT 36.2 (L) 05/07/2022   MCV 95.8 05/07/2022   PLT 217 05/07/2022   NEUTROABS 7.2 05/07/2022    Imaging:  No results found.  Medications: I have reviewed the patient's current medications.  Assessment/Plan: Multiple myeloma- IgG lambda serum monoclonal protein, elevated serum lambda light chains Bone survey 10/11/2017-lytic lesions noted in the skull, right iliac, left second rib, and mottled appearance of the proximal femurs/pelvis Bone marrow biopsy 10/14/2017-hypercellular marrow with plasma cell neoplasm, 82% plasma cells, lambda light chain restricted, hyperdiploid with gains of chromosomes 3, 5, 7, 9, and 11.  FISH panel positive for gain of ATM (+11) Cycle 1 RVD 10/18/2017 (Revlimid started 10/23/2017) Cycle 2 RVD 11/15/2017 Cycle 3 RVD 12/13/2017 Cycle 4 RVD 01/14/2018 Cycle 5 of RVD 02/14/2018 (Revlimid given for 7 days and 1 week of Velcade), therapy held beginning 02/21/2018 secondary to plan for stem cell therapy Bone marrow biopsy 02/20/2018, 1-2% plasma cell PET  scan 02/20/2018, no malignant range activity above background, numerous lytic lesions throughout the axial and appendicular skeleton,, left iliac wing fracture Melphalan, 200 mg/m on 03/24/2018 Stem cell infusion 03/25/2018 Restaging at [facility] 07/09/2018: Normal lambda light chains, no serum M spike, bone marrow biopsy negative for myeloma, less than 1% plasma cells PET scan at [facility] 07/09/2018- lytic bone lesions, no malignant range activity Initiation of maintenance Revlimid, 10 mg, 21/28 days 08/15/2018 Cycle 2 maintenance Revlimid 09/12/2018 Cycle 3 maintenance Revlimid 10/10/2018 Revlimid placed on hold 06/15/2019 due to presyncopal/syncopal episodes and diarrhea Revlimid resumed 5 mg 21 days on/7 days off following office visit 06/29/2019 Revlimid placed on hold 07/14/2019 Revlimid resumed 08/21/2019 Bone survey 09/09/2019- no acute findings or clear explanation for right buttock pain.  Lucent lesions in the right pelvis are stable without pathologic fracture.  Evidence of healing lytic lesions in the right scapula, cervical spine spinous processes and left L4 transverse process.  Possible mild progression of lytic lesions within the L1 and L4 vertebral bodies.  Stable lytic lesions in the calvarium. PET scan 09/25/2019-new FDG avid bone lesions in the right humeral neck and sacrum similar remaining lytic lesions with FDG activity below background Bone marrow biopsy at [facility] on 10/01/2019-5% plasma cells on the bone marrow biopsy suboptimal sample  Cycle 1 daratumumab, pomalidomide, Decadron 10/19/2019 Zometa 10/19/2019 Cycle 2 daratumumab, pomalidomide, Decadron 11/16/2019 (he will begin the pomalidomide 11/17/2019) Cycle 3 daratumumab, pomalidomide, and Decadron 12/14/2019 Cycle 4 daratumumab, pomalidomide, and Decadron 01/11/2020 Cycle 5 daratumumab, pomalidomide, Decadron 02/03/2020 (he began pomalidomide on 02/06/2020) Cycle 6  daratumumab, pomalidomide, Decadron 03/02/2020 (pomalidomide  starting 03/12/2020) Cycle 7 daratumumab, pomalidomide, Decadron 03/29/2020 (pomalidomide scheduled to start 04/15/2020) Cycle 8 daratumumab, pomalidomide, Decadron 05/04/2020 (pomalidomide started 05/14/2020) Cycle 9 daratumumab-monthly 06/15/2020, pomalidomide held Cycle 10 daratumumab 07/13/2020, pomalidomide 2 mg 21 days beginning 07/15/2020 Cycle 11 daratumumab 08/10/2020, pomalidomide 2 mg 21 days beginning 08/12/2020 Bone marrow biopsy 09/06/2020-hypocellular bone marrow with a relative erythroid hyperplasia and no increase in plasma cells; MRD 0.0050% Cycle 12 daratumumab 09/07/2020, pomalidomide 2 mg 21 days beginning 09/09/2020 Cycle 13 daratumumab 10/11/2020, pomalidomide 2 mg 21 days beginning 10/10/2020 Cycle 14 daratumumab 11/08/2020, pomalidomide reduced to 1 mg 21 days Cycle 15 daratumumab 12/14/2020, Pomalidomide 1 mg 21 days beginning 12/13/2020 Cycle 16 daratumumab 01/12/2021, pomalidomide 1 mg 21 days beginning 01/11/2021 Cycle 17 daratumumab 02/10/2021, pomalidomide 1 mg 21 days beginning 02/09/2021 Cycle 18 daratumumab 03/10/2021, pomalidomide 1 mg 21 days beginning 03/11/2021 Cycle 19 daratumumab 04/07/2021, Pomalidomide 1 mg 21 days beginning 04/08/2021 Cycle 20 daratumumab 05/09/2021, Pomalidomide 1 mg 21 days beginning 05/12/2021 Cycle 21 daratumumab 06/07/2021, pomalidomide held secondary to progressive neuropathy and diarrhea Cycle 22 daratumumab 07/05/2021, pomalidomide held secondary to neuropathy Cycle 23 daratumumab 08/02/2021, Pomalidomide remains on hold Cycle 24 daratumumab 08/30/2021, pomalidomide on hold Cycle 25 daratumumab 09/27/2021, pomalidomide on hold Cycle 26 daratumumab 10/23/2021, pomalidomide on hold Cycle 27 daratumumab 11/20/2021, pomalidomide on hold Cycle 28 daratumumab 12/18/2021, Pomalidomide on hold Increased serum M spike and serum free lambda light chains December 2022 PET 01/12/2022-multiple lytic lesions with mild hypermetabolism, most prominent hypermetabolic lesion at the  sacrum Cycle 1 carfilzomib/Cytoxan/Decadron 01/29/2022 Cycle 2 carfilzomib/Cytoxan/Decadron 02/26/2022 02/26/2022-mild increase in serum M spike and serum free lambda light chains 03/19/2022-further increase in serum free lambda light chains, stable serum M spike Referred for palliative radiation 03/26/2022 Cycle 3 carfilzomib/Cytoxan/Decadron 03/27/2022 Radiation to sacral lesion 03/27/2022 - 03/30/2022   2.  Pain secondary to multiple myeloma involving the spine and pelvis-resolved MRI of the lumbar spine 10/02/2018- numerous rounded foci in the vertebral bodies, hypertrophy of the L4 transverse process   3.  Hypertension-losartan dose increased 11/15/2017   4.  Depression-improved with Wellbutrin beginning September 2020   5.  Altered mental status-improved depression related? 6. Diabetes 7.  Recurrent episodes of fall/syncope-etiology unclear-evaluated by [facility]y at [facility], MRI brain 01/25/2020 with abnormal signal at the left superior frontal gyrus-potentially representing a low-grade glioma, scattered foci of increased T2 white matter signal-likely related to chronic small vessel disease Stereotactic brain biopsy at [facility] 03/14/2020-gliosis, no evidence of malignancy   8.  Peripheral neuropathy-gabapentin started 07/07/2020; reports increase in symptoms 06/07/2021-pomalidomide placed on hold; reports increase in symptoms 08/02/2021   9.  Renal insufficiency   10.  Left lower extremity cellulitis 02/01/2022   11.  Occlusive clot in the greater saphenous vein 02/05/2022-Eliquis initiated  Disposition: [name] appears unchanged.  He is currently off of specific therapy for myeloma.  Most recent myeloma panel was stable.  Plan to continue to follow with observation.  He completed radiation to the sacrum 03/27/2022 through 03/30/2022.  Back pain overall is better.  He will try to taper the oxycodone as tolerated.  He will return for lab and follow-up in approximately 1 month.  We are  available to see him sooner if needed.  Patient seen with Dr. Benay Spice.  [name] ANP/GNP-BC   06/12/2022  2:26 PM  This was a shared visit with [name].  Mr. Riches continues to have severe pain at the sacrum.  This is the area where he  completed palliative radiation several months ago.  He is taking a large number of oxycodone tablets for relief of pain.  I will contact Dr. Feliciana Rossetti to discuss treatment of the myeloma.  He has been maintained off of systemic therapy since April.   We have a low clinical suspicion for progressive venous thrombosis involving the left lower extremity.  I was present for greater than 50% of today's visit.  I performed medical decision making.  Julieanne Manson, MD

## 2022-06-15 ENCOUNTER — Other Ambulatory Visit: Payer: Self-pay | Admitting: Nurse Practitioner

## 2022-06-15 DIAGNOSIS — C9000 Multiple myeloma not having achieved remission: Secondary | ICD-10-CM

## 2022-06-18 ENCOUNTER — Telehealth: Payer: Self-pay

## 2022-06-18 ENCOUNTER — Other Ambulatory Visit: Payer: Self-pay | Admitting: Nurse Practitioner

## 2022-06-18 DIAGNOSIS — C9000 Multiple myeloma not having achieved remission: Secondary | ICD-10-CM

## 2022-06-18 NOTE — Telephone Encounter (Signed)
Patient gave verbal understanding and had no further questions or concerns  

## 2022-06-18 NOTE — Telephone Encounter (Signed)
-----   Message from Owens Shark, NP sent at 06/15/2022  3:39 PM EDT ----- Please let him and his wife know Dr. Benay Spice spoke to Dr. Feliciana Rossetti.  Dr. Feliciana Rossetti recommends a restaging PET scan.  I am placing the order today.

## 2022-06-20 ENCOUNTER — Other Ambulatory Visit: Payer: Self-pay | Admitting: *Deleted

## 2022-06-20 MED ORDER — LORAZEPAM 1 MG PO TABS: 0.5000 mg | ORAL_TABLET | ORAL | 0 refills | Status: DC

## 2022-06-20 MED ORDER — LORAZEPAM 1 MG PO TABS: 0.5000 mg | ORAL_TABLET | ORAL | 0 refills | Status: AC

## 2022-06-21 ENCOUNTER — Other Ambulatory Visit: Payer: Self-pay | Admitting: Oncology

## 2022-06-21 ENCOUNTER — Other Ambulatory Visit: Payer: Self-pay | Admitting: Nurse Practitioner

## 2022-06-21 DIAGNOSIS — C9000 Multiple myeloma not having achieved remission: Secondary | ICD-10-CM

## 2022-06-21 MED ORDER — OXYCODONE HCL 5 MG PO TABS: 10.0000 mg | ORAL_TABLET | ORAL | 0 refills | Status: DC | PRN

## 2022-06-22 ENCOUNTER — Telehealth: Payer: Self-pay | Admitting: *Deleted

## 2022-06-22 ENCOUNTER — Other Ambulatory Visit: Payer: Self-pay | Admitting: Oncology

## 2022-06-22 ENCOUNTER — Encounter: Payer: Self-pay | Admitting: Oncology

## 2022-06-22 ENCOUNTER — Other Ambulatory Visit: Payer: Self-pay

## 2022-06-22 DIAGNOSIS — I82890 Acute embolism and thrombosis of other specified veins: Secondary | ICD-10-CM

## 2022-06-22 DIAGNOSIS — C9000 Multiple myeloma not having achieved remission: Secondary | ICD-10-CM

## 2022-06-22 MED ORDER — APIXABAN 5 MG PO TABS: 5.0000 mg | ORAL_TABLET | Freq: Two times a day (BID) | ORAL | 0 refills | Status: DC

## 2022-06-22 MED ORDER — OXYCODONE HCL 5 MG PO TABS: 10.0000 mg | ORAL_TABLET | ORAL | 0 refills | Status: DC | PRN

## 2022-06-22 NOTE — Telephone Encounter (Signed)
Call from Steve Carter that they are not able to obtain his oxycodone 5 mg tablets. Confirmed that [facility] [place] will have drug tomorrow. Confirmed with [name] that that will be OK. MD notified.

## 2022-06-25 ENCOUNTER — Ambulatory Visit (HOSPITAL_COMMUNITY)
Admission: RE | Admit: 2022-06-25 | Discharge: 2022-06-25 | Disposition: A | Discharge status: Home / Self Care | Payer: Medicare PPO | Source: Ambulatory Visit | Attending: Nurse Practitioner | Admitting: Nurse Practitioner

## 2022-06-25 ENCOUNTER — Other Ambulatory Visit: Payer: Self-pay

## 2022-06-25 DIAGNOSIS — C9000 Multiple myeloma not having achieved remission: Secondary | ICD-10-CM | POA: Insufficient documentation

## 2022-06-25 LAB — GLUCOSE, CAPILLARY: Glucose-Capillary: 252 mg/dL — ABNORMAL HIGH (ref 70–99)

## 2022-06-25 MED ORDER — FLUDEOXYGLUCOSE F - 18 (FDG) INJECTION
13.1900 | Freq: Once | INTRAVENOUS | Status: AC | PRN
  Administered 2022-06-25: 13.19 via INTRAVENOUS

## 2022-06-28 ENCOUNTER — Encounter: Payer: Self-pay | Admitting: Nurse Practitioner

## 2022-06-29 ENCOUNTER — Inpatient Hospital Stay: Payer: Medicare PPO | Admitting: Oncology

## 2022-06-29 ENCOUNTER — Telehealth: Payer: Self-pay

## 2022-06-29 ENCOUNTER — Inpatient Hospital Stay: Payer: Medicare PPO

## 2022-06-29 NOTE — Telephone Encounter (Signed)
-----   Message from Ladell Pier, MD sent at 06/28/2022  8:50 PM EDT ----- Please call patient, PET shows active myeloma in several bone sites, needs to f/u with dr Zenon Mayo to discuss starting treatment

## 2022-06-29 NOTE — Telephone Encounter (Signed)
TC to Pt. Spoke with Pt's wife who verbalized understanding.

## 2022-07-03 ENCOUNTER — Other Ambulatory Visit: Payer: Self-pay

## 2022-07-04 ENCOUNTER — Encounter: Payer: Self-pay | Admitting: Oncology

## 2022-07-04 ENCOUNTER — Other Ambulatory Visit: Payer: Self-pay | Admitting: Nurse Practitioner

## 2022-07-04 DIAGNOSIS — C9000 Multiple myeloma not having achieved remission: Secondary | ICD-10-CM

## 2022-07-04 MED ORDER — OXYCODONE HCL 5 MG PO TABS: 10.0000 mg | ORAL_TABLET | ORAL | 0 refills | Status: AC | PRN

## 2022-07-11 ENCOUNTER — Encounter: Payer: Self-pay | Admitting: Oncology

## 2022-07-12 ENCOUNTER — Encounter: Payer: Self-pay | Admitting: Oncology

## 2022-07-16 ENCOUNTER — Inpatient Hospital Stay: Payer: Medicare PPO

## 2022-07-16 ENCOUNTER — Inpatient Hospital Stay: Payer: Medicare PPO | Admitting: Oncology

## 2022-07-20 ENCOUNTER — Other Ambulatory Visit: Payer: Self-pay

## 2022-07-24 ENCOUNTER — Encounter: Payer: Self-pay | Admitting: Oncology

## 2022-07-24 ENCOUNTER — Other Ambulatory Visit: Payer: Self-pay

## 2022-07-24 DIAGNOSIS — C9000 Multiple myeloma not having achieved remission: Secondary | ICD-10-CM

## 2022-07-24 DIAGNOSIS — I82890 Acute embolism and thrombosis of other specified veins: Secondary | ICD-10-CM

## 2022-07-24 MED ORDER — APIXABAN 5 MG PO TABS: 5.0000 mg | ORAL_TABLET | Freq: Two times a day (BID) | ORAL | 0 refills | Status: AC

## 2022-07-25 ENCOUNTER — Other Ambulatory Visit: Payer: Self-pay

## 2022-07-28 ENCOUNTER — Other Ambulatory Visit: Payer: Self-pay

## 2022-08-24 ENCOUNTER — Other Ambulatory Visit: Payer: Self-pay

## 2022-10-20 ENCOUNTER — Other Ambulatory Visit: Payer: Self-pay

## 2022-11-19 ENCOUNTER — Other Ambulatory Visit: Payer: Self-pay | Admitting: Oncology

## 2022-11-19 DIAGNOSIS — I82890 Acute embolism and thrombosis of other specified veins: Secondary | ICD-10-CM

## 2022-11-19 DIAGNOSIS — C9000 Multiple myeloma not having achieved remission: Secondary | ICD-10-CM

## 2022-11-19 NOTE — Telephone Encounter (Signed)
Needs to be filled by Dr. Feliciana Rossetti at [facility]

## 2022-11-22 ENCOUNTER — Other Ambulatory Visit: Payer: Self-pay

## 2022-12-12 ENCOUNTER — Other Ambulatory Visit: Payer: Self-pay

## 2023-01-01 ENCOUNTER — Encounter: Payer: Self-pay | Admitting: Oncology

## 2023-01-03 ENCOUNTER — Ambulatory Visit (INDEPENDENT_AMBULATORY_CARE_PROVIDER_SITE_OTHER): Payer: 59 | Admitting: Gastroenterology

## 2023-01-03 ENCOUNTER — Encounter (INDEPENDENT_AMBULATORY_CARE_PROVIDER_SITE_OTHER): Payer: Self-pay | Admitting: Gastroenterology

## 2023-01-03 VITALS — BP 81/55 | HR 74 | Temp 97.5°F | Ht 74.0 in | Wt 251.5 lb

## 2023-01-03 DIAGNOSIS — A0831 Calicivirus enteritis: Secondary | ICD-10-CM

## 2023-01-03 MED ORDER — DIPHENOXYLATE-ATROPINE 2.5-0.025 MG PO TABS: 1.0000 | ORAL_TABLET | Freq: Four times a day (QID) | ORAL | 1 refills | Status: AC | PRN

## 2023-01-03 NOTE — Progress Notes (Signed)
Maylon Peppers, M.D. Gastroenterology & Hepatology [facility] [address] [place], [place] [address] Primary Care Physician: Tempie Hoist, [place] [id number]  Referring MD: PCP  Chief Complaint:  diarrhea  History of Present Illness: Steve Carter is a 57 y.o. male with PMH multiple myeloma s/p multiple chemotheraopy regimens and stem cell transplant in remission, DM depression, anxiety, GERD, HTN, OSA, DVT on Eliquis, who presents for evaluation of diarrhea.  Patient comes to the office with his wife.  Patient reports that 3 weeks ago he presented new onset of diarrhea described as watery Bms without blood or mucus. He used to have regular bowel movements in the past and never had a similar episode in the past. He was initially having 15-20 Bms per day, but he reports has slowed down to 7-8 per day. He has been taking Imodium 2 pills per day, but he has not noticed a big improvement with this.  He has been trying to drink Pedialyte every couple of days to stay hydrated but he feels very weak. He has not vomited but has had intermittent nausea. He reports his appetite has been preserved but he is concerned as whenever he eats comes out as diarrhea - has been avoiding dairy as advised by his PCP.  The patient has presented significant diarrhea.Patient is PCPs office on 12/26/2022 received IV fluids as he was feeling very dehydrated - reports he has received this twice.  I reviewed the labs from 12/18/2022 which showed a hemoglobin of 19.6, WBC 9.8, platelet 252, decreased lymphocytes of 5.3%, creatinine 1.63, BUN 34, sodium 135, potassium 4.4, calcium 7.5, ALT 171, AST 81, alkaline phosphatase 120, total bili 0.4, stool testing Today showed a negative celiac but was presence of positive stool testing for Supple virus GI pathogen panel.  Most recent labs from 12/26/2022 showed creatinine of 1.39, BUN 18, potassium  4.4, sodium 141, calcium 7.9, ALT 16, AST 30, alkaline phosphatase 117, bili 3.9, total bili 0.3, CBC with WBC 6.7, hemoglobin 17.7, platelets 224, decreased lymphocytes of 4.2%.  The patient denies having any vomiting, fever, chills, hematochezia, melena, hematemesis, abdominal distention, abdominal pain, jaundice, pruritus or weight loss. Has not felt anynew  lightheadedness   Last EGD:15 years ago by Dr. Willette Pa per patient, no report available, patient states it was normal Last Colonoscopy:15 years ago by Dr. Willette Pa per patient, no report available, patient states it was normal  FHx: neg for any gastrointestinal/liver disease, father lung cancer, mother and skin BCC Social: neg smoking but chews tobacco, alcohol or illicit drug use Surgical: cholecystectomy  Past Medical History: Past Medical History:  Diagnosis Date   Anxiety    Depression    GERD (gastroesophageal reflux disease)    Hypertension    Sleep apnea    uses C-Pap    Past Surgical History: Past Surgical History:  Procedure Laterality Date   CHOLECYSTECTOMY      Family History:History reviewed. No pertinent family history.  Social History: Social History   Tobacco Use  Smoking Status Never  Smokeless Tobacco Current   Types: Snuff   Social History   Substance and Sexual Activity  Alcohol Use Yes   Alcohol/week: 1.0 standard drink of alcohol   Types: 1 Cans of beer per week   Social History   Substance and Sexual Activity  Drug Use No    Allergies: No Known Allergies  Medications: Current Outpatient Medications  Medication Sig Dispense Refill  ACCU-CHEK GUIDE test strip 1 each 3 (three) times daily.     apixaban (ELIQUIS) 5 MG TABS tablet Take 1 tablet (5 mg total) by mouth 2 (two) times daily. 60 tablet 0   ARIPiprazole (ABILIFY) 15 MG tablet TAKE 1 TABLET (15 MG TOTAL) BY MOUTH DAILY. 90 tablet 1   Blood Glucose Monitoring Suppl (ACCU-CHEK GUIDE) w/Device KIT      buPROPion  (WELLBUTRIN XL) 150 MG 24 hr tablet TAKE 3 TABLETS EVERY MORNING 270 tablet 3   BYSTOLIC 10 MG tablet Take 10 mg by mouth 2 (two) times daily.     DROPLET PEN NEEDLES 31G X 8 MM MISC      DULoxetine (CYMBALTA) 60 MG capsule Take 60 mg by mouth daily.     escitalopram (LEXAPRO) 20 MG tablet Take 1 tablet (20 mg total) by mouth daily. 90 tablet 3   hydrochlorothiazide (HYDRODIURIL) 25 MG tablet Take 25 mg by mouth daily.     insulin glargine (LANTUS) 100 UNIT/ML injection Inject into the skin daily as needed. Based on glucose reading at bedtime-Hold if 150 or lower     Lancets Misc. (ACCU-CHEK SOFTCLIX LANCET DEV) KIT      LORazepam (ATIVAN) 1 MG tablet Take 0.5 tablets (0.5 mg total) by mouth as directed. Take 1/2 tablet 30 minutes to 1 hour prior to PET scan. May repeat x 1 1 tablet 0   losartan (COZAAR) 100 MG tablet TAKE 1 TABLET BY MOUTH EVERY DAY 30 tablet 0   Omeprazole Magnesium (PRILOSEC OTC PO) Take 20 mg daily by mouth.     ondansetron (ZOFRAN) 8 MG tablet Take 1 tablet (8 mg total) by mouth every 8 (eight) hours as needed for nausea or vomiting. 30 tablet 1   oxyCODONE (OXY IR/ROXICODONE) 5 MG immediate release tablet Take 2-4 tablets (10-20 mg total) by mouth every 4 (four) hours as needed for severe pain. 100 tablet 0   pregabalin (LYRICA) 300 MG capsule Take 600 mg by mouth at bedtime.     prochlorperazine (COMPAZINE) 5 MG tablet TAKE 1-2 TABLETS (5-10 MG TOTAL) BY MOUTH EVERY 6 (SIX) HOURS AS NEEDED FOR NAUSEA OR VOMITING. 30 tablet 1   testosterone cypionate (DEPOTESTOTERONE CYPIONATE) 100 MG/ML injection Inject 200 mg into the muscle every 14 (fourteen) days. For IM use only     traZODone (DESYREL) 50 MG tablet Take 1 tablet (50 mg total) by mouth at bedtime. 90 tablet 3   acyclovir (ZOVIRAX) 400 MG tablet Take 400 mg by mouth 2 (two) times daily. (Patient not taking: Reported on 01/03/2023)     dexamethasone (DECADRON) 4 MG tablet Take 20 mg (#5 tab) by mouth on day after each  chemotherapy treatment (Patient not taking: Reported on 06/12/2022) 20 tablet 1   dexamethasone (DECADRON) 4 MG tablet Take by mouth. (Patient not taking: Reported on 06/12/2022)     primidone (MYSOLINE) 250 MG tablet Take by mouth. (Patient not taking: Reported on 01/03/2023)     No current facility-administered medications for this visit.    Review of Systems: GENERAL: negative for malaise, night sweats HEENT: No changes in hearing or vision, no nose bleeds or other nasal problems. NECK: Negative for lumps, goiter, pain and significant neck swelling RESPIRATORY: Negative for cough, wheezing CARDIOVASCULAR: Negative for chest pain, leg swelling, palpitations, orthopnea GI: SEE HPI MUSCULOSKELETAL: Negative for joint pain or swelling, back pain, and muscle pain. SKIN: Negative for lesions, rash PSYCH: Negative for sleep disturbance, mood disorder and recent psychosocial stressors. HEMATOLOGY  Negative for prolonged bleeding, bruising easily, and swollen nodes. ENDOCRINE: Negative for cold or heat intolerance, polyuria, polydipsia and goiter. NEURO: negative for tremor, gait imbalance, syncope and seizures. The remainder of the review of systems is noncontributory.   Physical Exam: BP (!) 81/55 (BP Location: Left Arm, Patient Position: Sitting, Cuff Size: Normal)   Pulse 74   Temp (!) 97.5 F (36.4 C) (Oral)   Ht '6\' 2"'$  (1.88 m)   Wt 251 lb 8 oz (114.1 kg)   BMI 32.29 kg/m  GENERAL: The patient is AO x3, in no acute distress. HEENT: Head is normocephalic and atraumatic. EOMI are intact. Mouth is slightly dry and without lesions. NECK: Supple. No masses LUNGS: Clear to auscultation. No presence of rhonchi/wheezing/rales. Adequate chest expansion HEART: RRR, normal s1 and s2. ABDOMEN: Soft, nontender, no guarding, no peritoneal signs, and nondistended. BS +. No masses. EXTREMITIES: Without any cyanosis, clubbing, rash, lesions or edema. NEUROLOGIC: AOx3, no focal motor deficit. SKIN:  no jaundice, no rashes   Imaging/Labs: as above  I personally reviewed and interpreted the available labs, imaging and endoscopic files.  Impression and Plan: Steve Carter is a 57 y.o. male with PMH multiple myeloma s/p multiple chemotheraopy regimens and stem cell transplant in remission, DM depression, anxiety, GERD, HTN, OSA, DVT on Eliquis, who presents for evaluation of diarrhea.  Patient had acute onset of diarrhea and positive testing for sapovirus and GI pathogen panel.  Even though he has presented with severe dehydration requiring IV hydration x 2 in the last 2 weeks, it seems that he is slowly improving as the amount of bowel movements has slowed down by half.  However, he is still presenting some moderate dehydration as evidenced by his borderline hypotension and mucous membranes.  He has not been vomiting and is able to tolerate oral intake.  I encouraged him to increase the intake of oral hydration frequently to avoid need to be evaluated in the ER but if he cannot keep up with losses he will need to be seen in the ER emergently.  As Imodium has not led to significant improvement of his diarrhea, he can stop this and start using loperamide if presenting more than 4 bowel movements per day.  He should continue with a bland diet and avoiding fat/dairy for now.  Also, he will start taking daily probiotic for the next month to restore his gastrointestinal flora.  He is due for colorectal cancer screening but given the acute events we will hold off on discussing a colonoscopy at this moment.  However, this may need to be discussed in next appointment, possibly proceeding with this 3 months after recovery.  - Stop Imodium, can take loperamide as needed if presenting more than 4 bowel movements per day - Drink Pedialytre frequently during the day to stay hydrated - Start Florastor 1 pill daily - If presenting severe persisting diarrhea, worsening lightheadedness, passing out, unable to  keep anything down should go to the ER to receive IV hydration - Continue bland diet avoid fatty meals or dairy for the next 4 weeks - Will discuss timing of screening colonoscopy in next appointment - RTC 4 weeks  All questions were answered.      Maylon Peppers, MD Gastroenterology and Hepatology [facility]

## 2023-01-03 NOTE — Patient Instructions (Addendum)
Stop Imodium, can take loperamide as needed if presenting more than 4 bowel movements per day Drink Pedialytre frequently during the day to stay hydrated Start Florastor 1 pill daily If presenting severe persisting diarrhea, worsening lightheadedness, passing out, unable to keep anything down please go to the ER to receive IV hydration Continue bland diet avoid fatty meals or dairy for the next 4 weeks Will discuss timing of screening colonoscopy in next appointment

## 2023-01-04 ENCOUNTER — Other Ambulatory Visit: Payer: Self-pay

## 2023-01-14 ENCOUNTER — Encounter (INDEPENDENT_AMBULATORY_CARE_PROVIDER_SITE_OTHER): Payer: Self-pay

## 2023-01-31 ENCOUNTER — Other Ambulatory Visit: Payer: Self-pay | Admitting: Adult Health

## 2023-01-31 DIAGNOSIS — F411 Generalized anxiety disorder: Secondary | ICD-10-CM

## 2023-01-31 DIAGNOSIS — F331 Major depressive disorder, recurrent, moderate: Secondary | ICD-10-CM

## 2023-02-07 ENCOUNTER — Encounter: Payer: Self-pay | Admitting: *Deleted

## 2023-02-07 NOTE — Progress Notes (Signed)
Received request for medical clearance from [facility]. Sent note back to practice that patient has not been seen here since 06/12/2022 and they need to reach out to [facility] oncologist, Dr. Montel Clock for this. Faxed to [phone]

## 2023-02-11 ENCOUNTER — Ambulatory Visit (INDEPENDENT_AMBULATORY_CARE_PROVIDER_SITE_OTHER): Payer: 59 | Admitting: Gastroenterology

## 2023-03-07 ENCOUNTER — Other Ambulatory Visit: Payer: Self-pay

## 2023-03-18 ENCOUNTER — Ambulatory Visit: Payer: Medicare PPO | Admitting: Adult Health

## 2023-05-29 ENCOUNTER — Other Ambulatory Visit: Payer: Self-pay | Admitting: Adult Health

## 2023-05-29 DIAGNOSIS — F411 Generalized anxiety disorder: Secondary | ICD-10-CM

## 2023-05-29 DIAGNOSIS — F331 Major depressive disorder, recurrent, moderate: Secondary | ICD-10-CM

## 2023-06-01 ENCOUNTER — Other Ambulatory Visit: Payer: Self-pay

## 2023-10-23 ENCOUNTER — Encounter: Payer: Self-pay | Admitting: Oncology

## 2023-10-23 NOTE — Telephone Encounter (Signed)
Telephone call  

## 2023-10-24 ENCOUNTER — Other Ambulatory Visit: Payer: Self-pay

## 2024-09-16 ENCOUNTER — Encounter (INDEPENDENT_AMBULATORY_CARE_PROVIDER_SITE_OTHER): Payer: Self-pay | Admitting: Gastroenterology
